# Patient Record
Sex: Female | Born: 1940 | Race: White | Hispanic: No | Smoking: Never smoker
Health system: Southern US, Community
[De-identification: ages and names within clinical notes are randomized; demographics above are authoritative.]

## PROBLEM LIST (undated history)

## (undated) DIAGNOSIS — I10 Essential (primary) hypertension: Secondary | ICD-10-CM

## (undated) DIAGNOSIS — N289 Disorder of kidney and ureter, unspecified: Secondary | ICD-10-CM

---

## 2010-09-09 ENCOUNTER — Encounter: Payer: Self-pay | Admitting: Internal Medicine

## 2010-09-16 ENCOUNTER — Encounter: Payer: Self-pay | Admitting: Internal Medicine

## 2011-03-25 DIAGNOSIS — J479 Bronchiectasis, uncomplicated: Secondary | ICD-10-CM

## 2012-11-17 ENCOUNTER — Emergency Department: Payer: Self-pay | Admitting: Emergency Medicine

## 2012-11-17 IMAGING — CR DG KNEE COMPLETE 4+V*L*
1 series · 4 of 4 positions shown · non-contrast
Comparison: none

REASON FOR EXAM: fall today, pain and swelling
COMMENTS:

PROCEDURE:     DXR - DXR KNEE LT COMP WITH OBLIQUES  - [DATE] [DATE]
RESULT:     Comparison: None.

[Series 1: t knee ap left · 0.14mm/px · 4 of 4 slices shown]
[im 1/4]
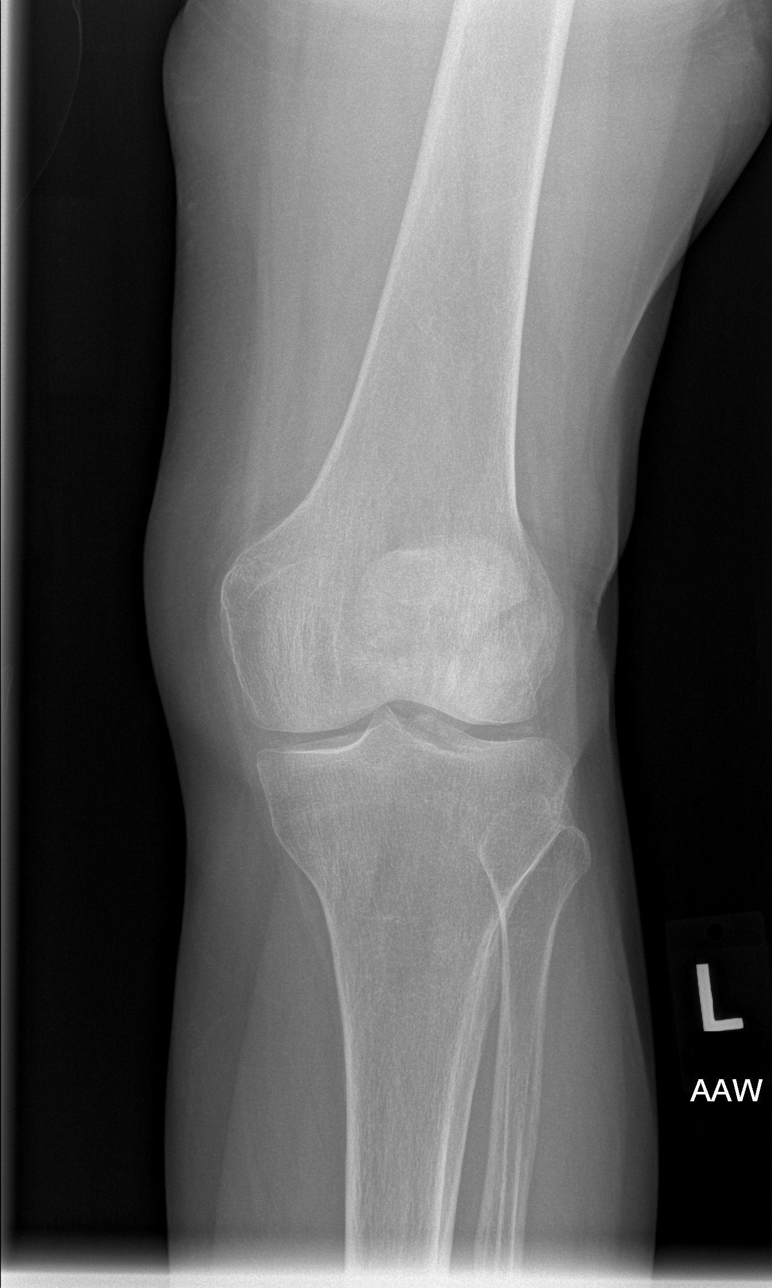
[im 2/4]
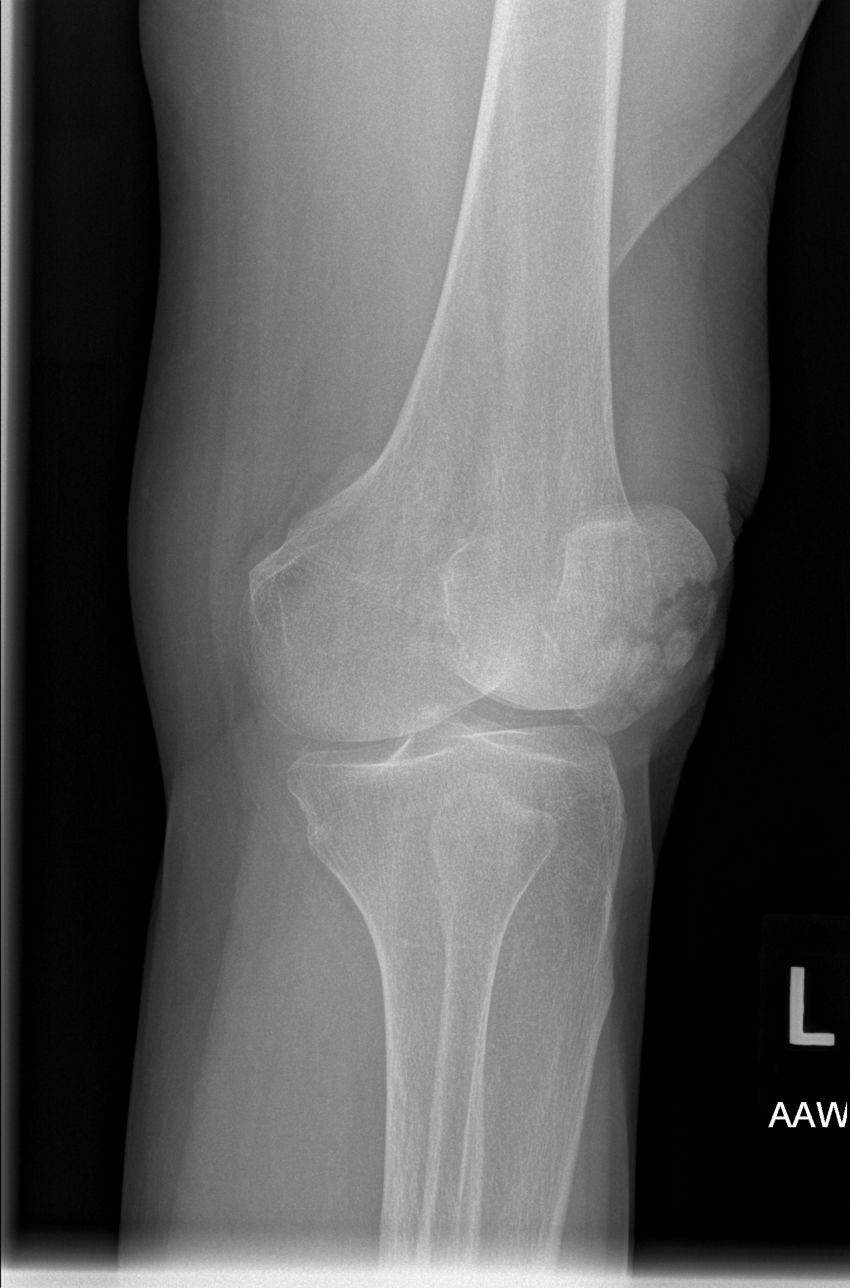
[im 3/4]
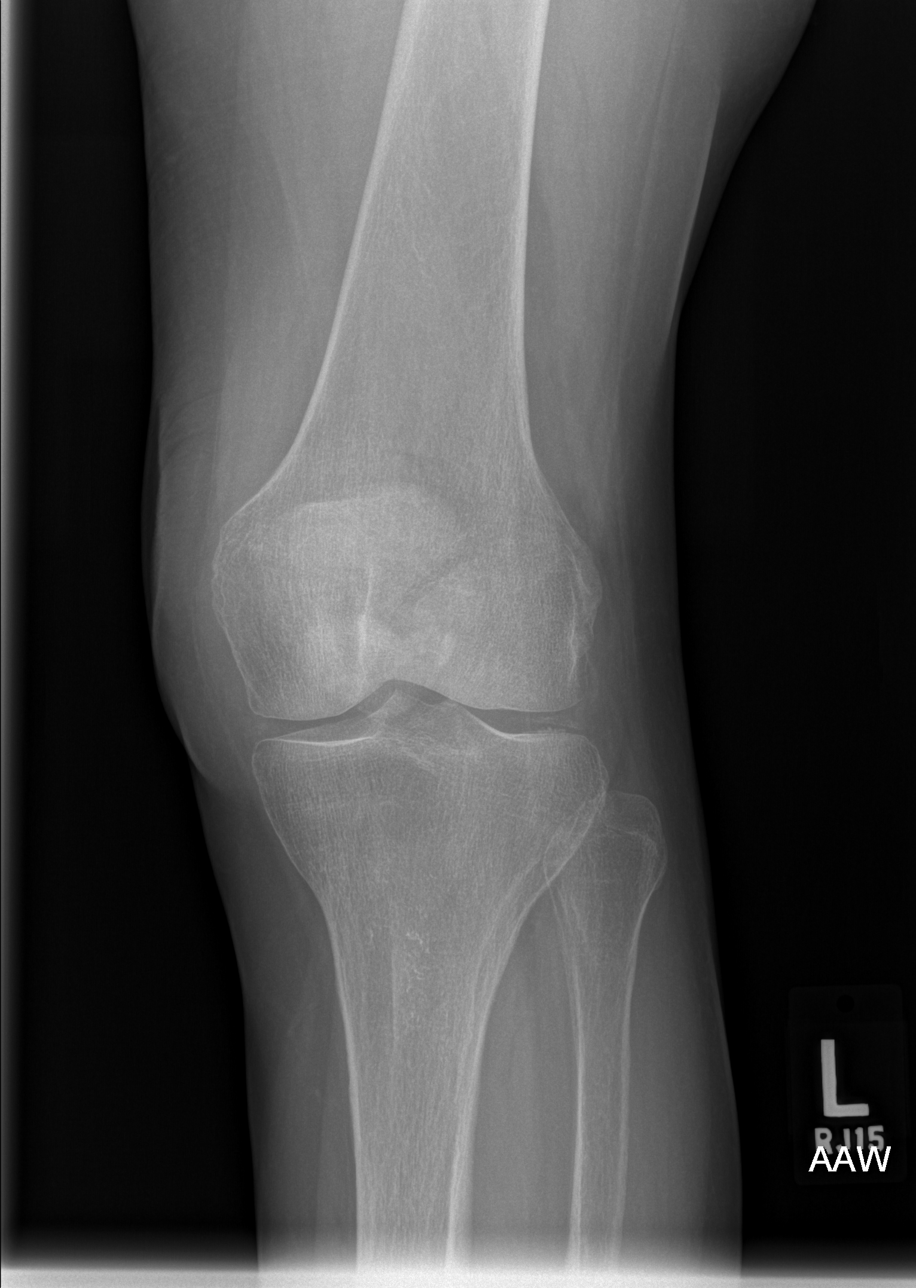
[im 4/4]
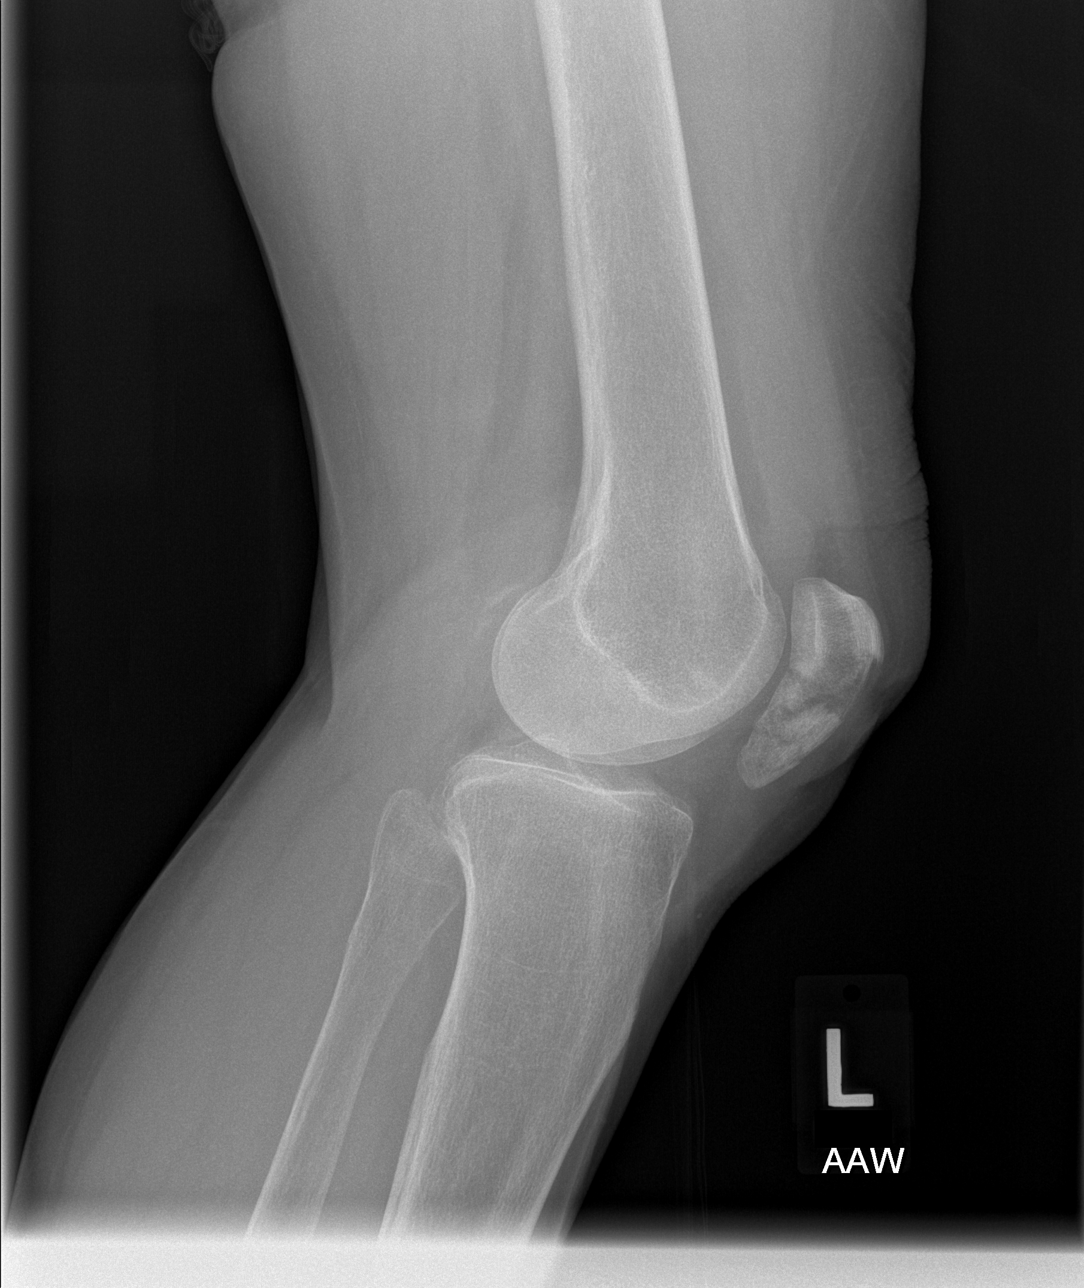

[4 of 4 positions shown; findings below may reference images not displayed]

FINDINGS: There is a comminuted, mildly displaced fracture of the patella.
Chondrocalcinosis is seen in the medial and lateral compartments, most
commonly seen with CPPD arthropathy. There is a small joint effusion.
IMPRESSION: Mildly displaced patellar fracture.

[REDACTED]

## 2015-12-06 DIAGNOSIS — E039 Hypothyroidism, unspecified: Secondary | ICD-10-CM | POA: Diagnosis present

## 2018-11-17 DIAGNOSIS — I10 Essential (primary) hypertension: Secondary | ICD-10-CM | POA: Diagnosis present

## 2021-07-19 ENCOUNTER — Other Ambulatory Visit: Payer: Self-pay

## 2021-07-19 ENCOUNTER — Inpatient Hospital Stay
Admission: EM | Admit: 2021-07-19 | Discharge: 2021-07-21 | DRG: 871 | Disposition: A | Discharge status: Other Acute Inpatient Hospital | Payer: Medicare Other | Attending: Obstetrics and Gynecology | Admitting: Obstetrics and Gynecology

## 2021-07-19 ENCOUNTER — Emergency Department: Payer: Medicare Other

## 2021-07-19 DIAGNOSIS — E872 Acidosis, unspecified: Secondary | ICD-10-CM | POA: Diagnosis present

## 2021-07-19 DIAGNOSIS — W19XXXA Unspecified fall, initial encounter: Secondary | ICD-10-CM | POA: Diagnosis present

## 2021-07-19 DIAGNOSIS — R410 Disorientation, unspecified: Secondary | ICD-10-CM

## 2021-07-19 DIAGNOSIS — I471 Supraventricular tachycardia, unspecified: Secondary | ICD-10-CM

## 2021-07-19 DIAGNOSIS — Z20822 Contact with and (suspected) exposure to covid-19: Secondary | ICD-10-CM | POA: Diagnosis present

## 2021-07-19 DIAGNOSIS — N3000 Acute cystitis without hematuria: Secondary | ICD-10-CM | POA: Diagnosis present

## 2021-07-19 DIAGNOSIS — R6520 Severe sepsis without septic shock: Secondary | ICD-10-CM | POA: Diagnosis present

## 2021-07-19 DIAGNOSIS — R471 Dysarthria and anarthria: Secondary | ICD-10-CM | POA: Diagnosis present

## 2021-07-19 DIAGNOSIS — S066XAA Traumatic subarachnoid hemorrhage with loss of consciousness status unknown, initial encounter: Secondary | ICD-10-CM | POA: Diagnosis present

## 2021-07-19 DIAGNOSIS — N179 Acute kidney failure, unspecified: Secondary | ICD-10-CM | POA: Diagnosis not present

## 2021-07-19 DIAGNOSIS — Z888 Allergy status to other drugs, medicaments and biological substances status: Secondary | ICD-10-CM

## 2021-07-19 DIAGNOSIS — K449 Diaphragmatic hernia without obstruction or gangrene: Secondary | ICD-10-CM | POA: Diagnosis present

## 2021-07-19 DIAGNOSIS — I10 Essential (primary) hypertension: Secondary | ICD-10-CM | POA: Diagnosis present

## 2021-07-19 DIAGNOSIS — Z91041 Radiographic dye allergy status: Secondary | ICD-10-CM

## 2021-07-19 DIAGNOSIS — R338 Other retention of urine: Secondary | ICD-10-CM | POA: Diagnosis not present

## 2021-07-19 DIAGNOSIS — N1330 Unspecified hydronephrosis: Secondary | ICD-10-CM

## 2021-07-19 DIAGNOSIS — E039 Hypothyroidism, unspecified: Secondary | ICD-10-CM | POA: Diagnosis present

## 2021-07-19 DIAGNOSIS — J189 Pneumonia, unspecified organism: Secondary | ICD-10-CM | POA: Diagnosis not present

## 2021-07-19 DIAGNOSIS — G9341 Metabolic encephalopathy: Secondary | ICD-10-CM | POA: Diagnosis present

## 2021-07-19 DIAGNOSIS — J479 Bronchiectasis, uncomplicated: Secondary | ICD-10-CM

## 2021-07-19 DIAGNOSIS — N134 Hydroureter: Secondary | ICD-10-CM | POA: Diagnosis present

## 2021-07-19 DIAGNOSIS — I129 Hypertensive chronic kidney disease with stage 1 through stage 4 chronic kidney disease, or unspecified chronic kidney disease: Secondary | ICD-10-CM | POA: Diagnosis present

## 2021-07-19 DIAGNOSIS — E860 Dehydration: Secondary | ICD-10-CM | POA: Diagnosis present

## 2021-07-19 DIAGNOSIS — J471 Bronchiectasis with (acute) exacerbation: Secondary | ICD-10-CM | POA: Diagnosis not present

## 2021-07-19 DIAGNOSIS — R636 Underweight: Secondary | ICD-10-CM | POA: Diagnosis present

## 2021-07-19 DIAGNOSIS — Z681 Body mass index (BMI) 19 or less, adult: Secondary | ICD-10-CM | POA: Diagnosis not present

## 2021-07-19 DIAGNOSIS — N170 Acute kidney failure with tubular necrosis: Secondary | ICD-10-CM | POA: Diagnosis not present

## 2021-07-19 DIAGNOSIS — I6389 Other cerebral infarction: Secondary | ICD-10-CM | POA: Diagnosis not present

## 2021-07-19 DIAGNOSIS — D631 Anemia in chronic kidney disease: Secondary | ICD-10-CM

## 2021-07-19 DIAGNOSIS — E785 Hyperlipidemia, unspecified: Secondary | ICD-10-CM | POA: Diagnosis present

## 2021-07-19 DIAGNOSIS — E871 Hypo-osmolality and hyponatremia: Secondary | ICD-10-CM

## 2021-07-19 DIAGNOSIS — R778 Other specified abnormalities of plasma proteins: Secondary | ICD-10-CM | POA: Diagnosis present

## 2021-07-19 DIAGNOSIS — R414 Neurologic neglect syndrome: Secondary | ICD-10-CM | POA: Diagnosis present

## 2021-07-19 DIAGNOSIS — A419 Sepsis, unspecified organism: Secondary | ICD-10-CM | POA: Diagnosis present

## 2021-07-19 DIAGNOSIS — I619 Nontraumatic intracerebral hemorrhage, unspecified: Secondary | ICD-10-CM | POA: Diagnosis not present

## 2021-07-19 DIAGNOSIS — M810 Age-related osteoporosis without current pathological fracture: Secondary | ICD-10-CM | POA: Diagnosis present

## 2021-07-19 DIAGNOSIS — I611 Nontraumatic intracerebral hemorrhage in hemisphere, cortical: Secondary | ICD-10-CM | POA: Diagnosis not present

## 2021-07-19 DIAGNOSIS — N184 Chronic kidney disease, stage 4 (severe): Secondary | ICD-10-CM | POA: Diagnosis present

## 2021-07-19 DIAGNOSIS — G936 Cerebral edema: Secondary | ICD-10-CM | POA: Diagnosis not present

## 2021-07-19 DIAGNOSIS — G9340 Encephalopathy, unspecified: Secondary | ICD-10-CM | POA: Diagnosis not present

## 2021-07-19 LAB — COMPREHENSIVE METABOLIC PANEL
ALT: 15 U/L (ref 0–44)
AST: 35 U/L (ref 15–41)
Albumin: 3.1 g/dL — ABNORMAL LOW (ref 3.5–5.0)
Alkaline Phosphatase: 68 U/L (ref 38–126)
Anion gap: 12 (ref 5–15)
BUN: 41 mg/dL — ABNORMAL HIGH (ref 8–23)
CO2: 17 mmol/L — ABNORMAL LOW (ref 22–32)
Calcium: 8.9 mg/dL (ref 8.9–10.3)
Chloride: 97 mmol/L — ABNORMAL LOW (ref 98–111)
Creatinine, Ser: 2.13 mg/dL — ABNORMAL HIGH (ref 0.44–1.00)
GFR, Estimated: 23 mL/min — ABNORMAL LOW (ref >60)
Glucose, Bld: 157 mg/dL — ABNORMAL HIGH (ref 70–99)
Potassium: 4.7 mmol/L (ref 3.5–5.1)
Sodium: 126 mmol/L — ABNORMAL LOW (ref 135–145)
Total Bilirubin: 0.7 mg/dL (ref 0.3–1.2)
Total Protein: 7.9 g/dL (ref 6.5–8.1)

## 2021-07-19 LAB — CBC
HCT: 31.8 % — ABNORMAL LOW (ref 36.0–46.0)
Hemoglobin: 10.2 g/dL — ABNORMAL LOW (ref 12.0–15.0)
MCH: 30.6 pg (ref 26.0–34.0)
MCHC: 32.1 g/dL (ref 30.0–36.0)
MCV: 95.5 fL (ref 80.0–100.0)
Platelets: 343 10*3/uL (ref 150–400)
RBC: 3.33 MIL/uL — ABNORMAL LOW (ref 3.87–5.11)
RDW: 13.4 % (ref 11.5–15.5)
WBC: 17.1 10*3/uL — ABNORMAL HIGH (ref 4.0–10.5)
nRBC: 0 % (ref 0.0–0.2)

## 2021-07-19 LAB — URINALYSIS, ROUTINE W REFLEX MICROSCOPIC
Bilirubin Urine: NEGATIVE
Glucose, UA: NEGATIVE mg/dL
Ketones, ur: NEGATIVE mg/dL
Leukocytes,Ua: NEGATIVE
Nitrite: NEGATIVE
Protein, ur: >=300 mg/dL — AB
Specific Gravity, Urine: 1.012 (ref 1.005–1.030)
pH: 6 (ref 5.0–8.0)

## 2021-07-19 LAB — RESP PANEL BY RT-PCR (FLU A&B, COVID) ARPGX2
Influenza A by PCR: NEGATIVE
Influenza B by PCR: NEGATIVE
SARS Coronavirus 2 by RT PCR: NEGATIVE

## 2021-07-19 LAB — LACTIC ACID, PLASMA: Lactic Acid, Venous: 3.5 mmol/L (ref 0.5–1.9)

## 2021-07-19 LAB — TROPONIN I (HIGH SENSITIVITY): Troponin I (High Sensitivity): 33 ng/L — ABNORMAL HIGH (ref <18)

## 2021-07-19 IMAGING — DX DG CHEST 1V PORT
1 series · 1 of 1 positions shown · non-contrast
Comparison: None.

CLINICAL DATA: Suspected sepsis.  Altered mental status.

EXAM:
PORTABLE CHEST 1 VIEW

[chest pa]
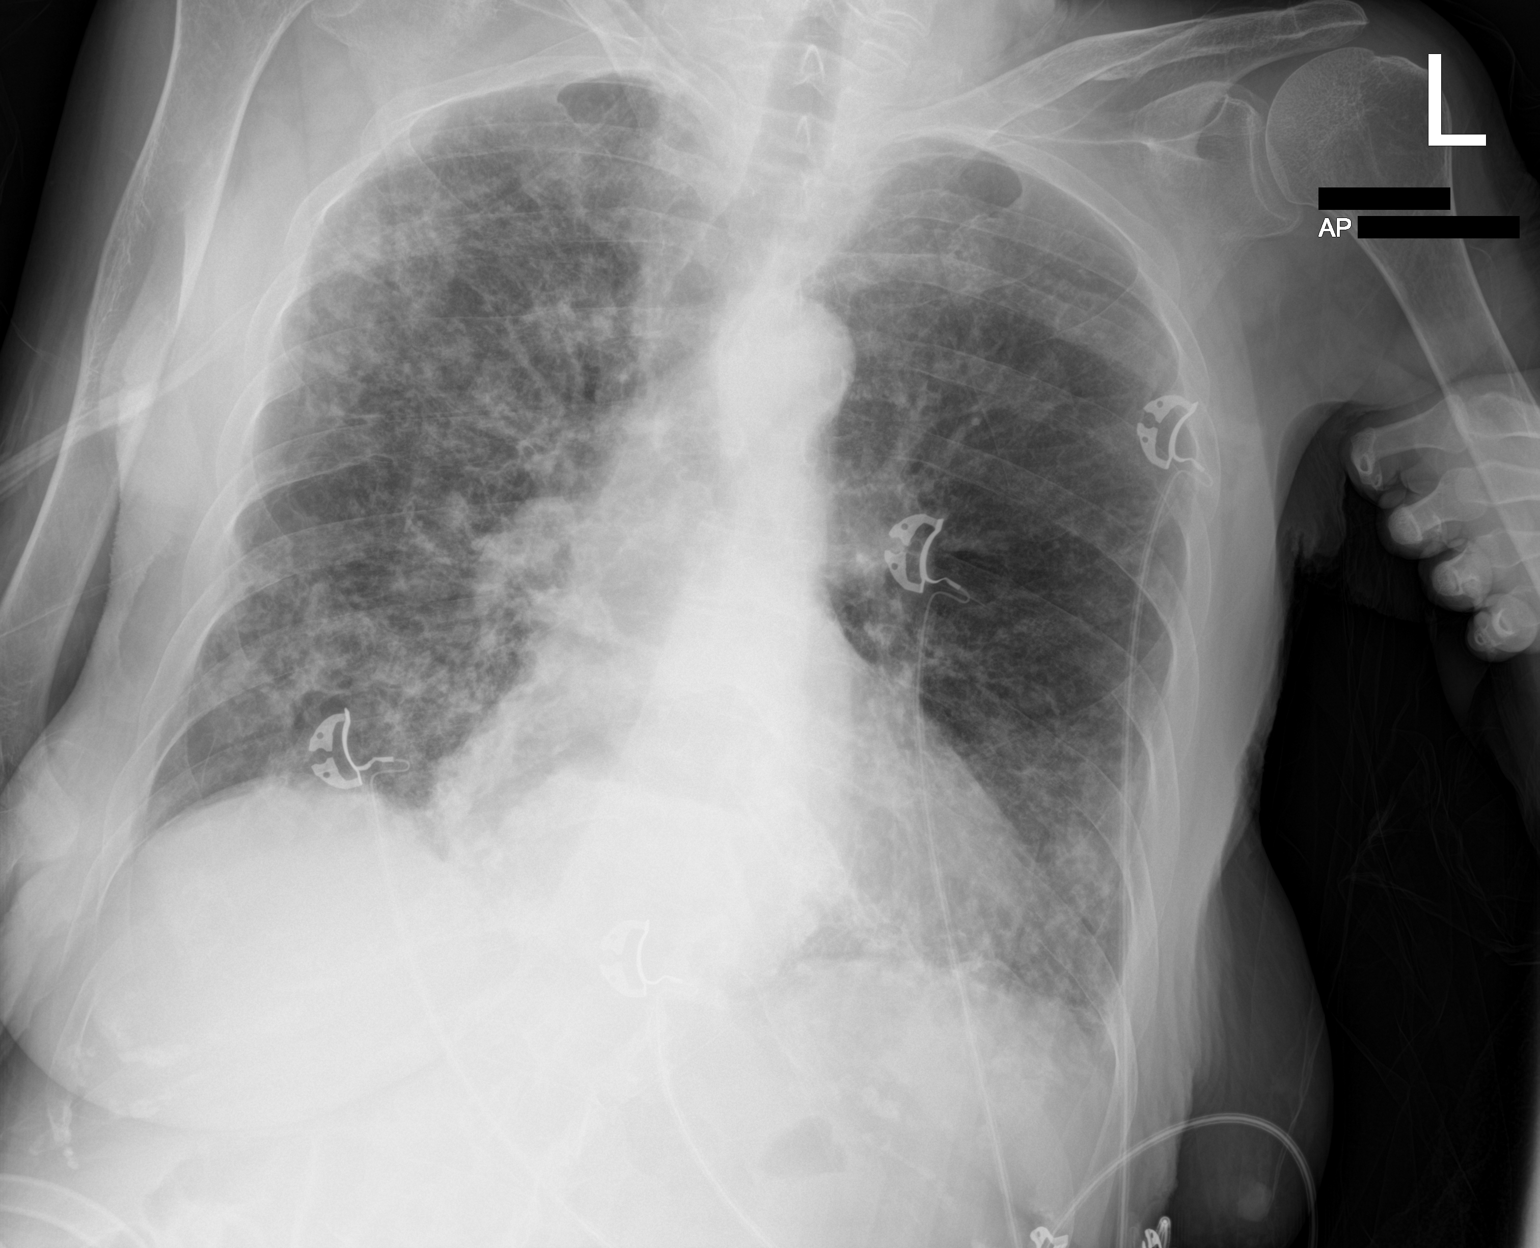

[1 of 1 positions shown; findings below may reference images not displayed]

FINDINGS: The heart is enlarged. There is a retrocardiac hiatal hernia. Patchy
airspace opacities throughout the right lung and at the left lung
base. There is background interstitial thickening. No significant
pleural effusion. No pneumothorax. The bones are under mineralized.
No acute osseous findings.
IMPRESSION: 1. Patchy airspace opacities throughout the right lung and at the
left lung base. Differential considerations include multifocal
pneumonia, including atypical organisms, or asymmetric pulmonary
edema.
2. Cardiomegaly and hiatal hernia.

## 2021-07-19 MED ORDER — ONDANSETRON HCL 4 MG/2ML IJ SOLN
4.0000 mg | Freq: Four times a day (QID) | INTRAMUSCULAR | Status: DC | PRN
Start: 2021-07-19 — End: 2021-07-21
  Administered 2021-07-21: 4 mg via INTRAVENOUS
  Filled 2021-07-19: qty 2

## 2021-07-19 MED ORDER — SODIUM CHLORIDE 0.9 % IV SOLN
500.0000 mg | INTRAVENOUS | Status: DC
  Administered 2021-07-20: 500 mg via INTRAVENOUS
  Filled 2021-07-19: qty 5

## 2021-07-19 MED ORDER — ENOXAPARIN SODIUM 30 MG/0.3ML IJ SOSY
30.0000 mg | PREFILLED_SYRINGE | INTRAMUSCULAR | Status: DC
  Administered 2021-07-19 – 2021-07-20 (×2): 30 mg via SUBCUTANEOUS
  Filled 2021-07-19 (×2): qty 0.3

## 2021-07-19 MED ORDER — SODIUM CHLORIDE 0.9 % IV SOLN: 2.0000 g | INTRAVENOUS | Status: DC

## 2021-07-19 MED ORDER — SODIUM CHLORIDE 0.9 % IV SOLN
2.0000 g | Freq: Once | INTRAVENOUS | Status: AC
  Administered 2021-07-19: 2 g via INTRAVENOUS
  Filled 2021-07-19: qty 2

## 2021-07-19 MED ORDER — VANCOMYCIN HCL 500 MG/100ML IV SOLN: 500.0000 mg | INTRAVENOUS | Status: DC

## 2021-07-19 MED ORDER — SODIUM CHLORIDE 0.9 % IV BOLUS (SEPSIS)
1000.0000 mL | Freq: Once | INTRAVENOUS | Status: AC
  Administered 2021-07-19: 1000 mL via INTRAVENOUS

## 2021-07-19 MED ORDER — ACETAMINOPHEN 325 MG PO TABS
650.0000 mg | ORAL_TABLET | Freq: Four times a day (QID) | ORAL | Status: DC | PRN
Start: 2021-07-19 — End: 2021-07-21
  Administered 2021-07-20 – 2021-07-21 (×3): 650 mg via ORAL
  Filled 2021-07-19 (×3): qty 2

## 2021-07-19 MED ORDER — LACTATED RINGERS IV SOLN: INTRAVENOUS | Status: AC

## 2021-07-19 MED ORDER — VANCOMYCIN HCL IN DEXTROSE 1-5 GM/200ML-% IV SOLN
1000.0000 mg | Freq: Once | INTRAVENOUS | Status: AC
Start: 2021-07-19 — End: 2021-07-20
  Administered 2021-07-19: 1000 mg via INTRAVENOUS
  Filled 2021-07-19: qty 200

## 2021-07-19 MED ORDER — METRONIDAZOLE 500 MG/100ML IV SOLN
500.0000 mg | Freq: Once | INTRAVENOUS | Status: AC
  Administered 2021-07-19: 500 mg via INTRAVENOUS
  Filled 2021-07-19: qty 100

## 2021-07-19 MED ORDER — ONDANSETRON HCL 4 MG PO TABS: 4.0000 mg | ORAL_TABLET | Freq: Four times a day (QID) | ORAL | Status: DC | PRN

## 2021-07-19 MED ORDER — ACETAMINOPHEN 650 MG RE SUPP
650.0000 mg | Freq: Four times a day (QID) | RECTAL | Status: DC | PRN
  Administered 2021-07-20: 650 mg via RECTAL
  Filled 2021-07-19 (×2): qty 2

## 2021-07-19 MED ORDER — HYDRALAZINE HCL 20 MG/ML IJ SOLN
5.0000 mg | INTRAMUSCULAR | Status: DC | PRN
  Administered 2021-07-19 – 2021-07-20 (×2): 5 mg via INTRAVENOUS
  Filled 2021-07-19 (×2): qty 1

## 2021-07-19 NOTE — Assessment & Plan Note (Addendum)
Renal function for the most part at baseline ?Follows with nephrology ?

## 2021-07-19 NOTE — H&P (Addendum)
?History and Physical  ? ? ?Patient: Lauren Conner LNL:[id number] DOB: 09-May-1941 ?DOA: 07/19/2021 ?DOS: the patient was seen and examined on 07/19/2021 ?PCP: No primary care provider on file.  ?Patient coming from: Home ? ?Chief Complaint:  ?Chief Complaint  ?Patient presents with  ? Altered Mental Status  ? ? ?HPI: Lauren Conner is a 81 y.o. female with medical history significant for CKD4, anemia of CKD, paroxysmal SVT, hypothyroidism, bronchiectasis, HTN, who lives alone and is functionally independent who was brought in for evaluation of weakness and confusion that was noted on the day of arrival.  History is limited due to altered mental status patient appeared to complain of right-sided flank pain ?ED course: On arrival febrile to 100.5 tachycardic to 124 and tachypneic to 23-24.  BP 154/121, O2 sat of 100% on room air ?Blood work significant for leukocytosis of 17,000 with lactic acid 3.5.  Creatinine 2.13, close to baseline of 2, with bicarb of 17.  Sodium 126.  Troponin 33.  Urinalysis with proteinuria but not consistent with UTI.  COVID and flu negative ?EKG, personally reviewed and interpreted: Sinus tachycardia at 127 with no acute ST-T wave changes ?Chest x-ray showing patchy airspace opacities throughout the right lung and at the left lung base with differential considerations including multifocal pneumonia, including atypical organisms or asymmetric pulmonary edema. ?Patient started on fluids per sepsis protocol, IV cefepime Vanco and Flagyl.  Hospitalist consulted for admission.  ? ?Following admission, CT abdomen and pelvis ordered for right flank pain with finding of severe bilateral hydronephrosis and proximal hydroureter ureter with severely distended bladder.  Foley catheter ordered for acute urinary retention ? ?Review of Systems: unable to review all systems due to the inability of the patient to answer questions. ?History reviewed. No pertinent past medical history. ?History reviewed. No pertinent  surgical history. ?Social History:  has no history on file for tobacco use, alcohol use, and drug use. ? ?Not on File ? ?History reviewed. No pertinent family history. ? ?Prior to Admission medications   ?Not on File  ? ? ?Physical Exam: ?Vitals:  ? 07/19/21 2115 07/19/21 2124 07/19/21 2200 07/19/21 2215  ?BP: (!) 154/121  (!) 172/133   ?Pulse: (!) 124  (!) 114 (!) 109  ?Resp: (!) 54  (!) 23 (!) 24  ?Temp:  (!) 100.5 ?F (38.1 ?C)    ?TempSrc:  Rectal    ?SpO2: 100%  100% 100%  ? ?Physical Exam ?Vitals and nursing note reviewed.  ?Constitutional:   ?   General: She is not in acute distress. ?   Appearance: She is ill-appearing and toxic-appearing.  ?HENT:  ?   Head: Normocephalic and atraumatic.  ?Cardiovascular:  ?   Rate and Rhythm: Regular rhythm. Tachycardia present.  ?   Pulses: Normal pulses.  ?   Heart sounds: Normal heart sounds. No murmur heard. ?Pulmonary:  ?   Effort: Tachypnea present.  ?   Breath sounds: Examination of the right-lower field reveals rales. Examination of the left-lower field reveals rales. Rales present. No wheezing or rhonchi.  ?Abdominal:  ?   General: Bowel sounds are normal.  ?   Palpations: Abdomen is soft.  ?   Tenderness: There is abdominal tenderness in the right lower quadrant.  ?Musculoskeletal:     ?   General: No swelling or tenderness. Normal range of motion.  ?   Cervical back: Normal range of motion and neck supple.  ?Skin: ?   General: Skin is warm and dry.  ?  Neurological:  ?   General: No focal deficit present.  ?   Mental Status: She is lethargic and disoriented.  ?Psychiatric:     ?   Mood and Affect: Mood normal.     ?   Behavior: Behavior normal.  ? ? ? ?Data Reviewed: ?Relevant notes from primary care and specialist visits, past discharge summaries as available in EHR, including Care Everywhere. ?Prior diagnostic testing as pertinent to current admission diagnoses ?Updated medications and problem lists for reconciliation ?ED course, including vitals, labs, imaging,  treatment and response to treatment ?Triage notes, nursing and pharmacy notes and ED provider's notes ?Notable results as noted in HPI ? ? 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 ?Severe sepsis ([place]) ?Sepsis criteria includes fever, tachycardia, tachypnea, altered mental status, leukocytosis and lactic acidosis ?Continue sepsis fluids per sepsis protocol ?Treat pneumonia as outlined below ? ? ?Hyponatremia ?Mild Acute on chronic: baseline 128-131 per review in care everywhere ?Continue IV hydration and monitor ? ?Metabolic acidosis ?Suspect metabolic due to kidney function, possibly mild worsening of stage IV kidney disease ?Monitor response to IV hydration and consider bicarb infusion little or no improvement ? ? ?Acute metabolic encephalopathy ?At baseline patient functionally independent.  Lives alone ?We will keep n.p.o. overnight to prevent aspiration ?Speech therapy evaluation ?Neurochecks, fall and aspiration precautions ?Delirium precautions ? ?Anemia of chronic kidney failure, stage 4 (severe) (HCC) ?Hemoglobin at baseline ? ?Paroxysmal SVT (supraventricular tachycardia) (HCC) ?Metoprolol IV as needed.  On oral metoprolol at home ? ?CKD (chronic kidney disease) stage 4, GFR 15-29 ml/min (HCC) ?Renal function for the most part at baseline ?Follows with nephrology ? ?Hypothyroidism (acquired) ?Levothyroxine when safe to swallow ? ?Adult bronchiectasis ([place]) ?DuoNebs as needed ? ?Essential hypertension ?Blood pressure elevated, uncontrolled ?Hydralazine IV as needed while n.p.o. ? ? ? ? ? ? ?Advance Care Planning:   Code Status: Not on file full ? ?Consults: none ? ?Family Communication: son and grand daughter at bedside ? ?Severity of Illness: ?The appropriate patient status for  this patient is INPATIENT. Inpatient status is judged to be reasonable and necessary in order to provide the required intensity of service to ensure the patient's safety. The patient's presenting symptoms, physical exam findings, and initial radiographic and laboratory data in the context of their chronic comorbidities is felt to place them at high risk for further clinical deterioration. Furthermore, it is not anticipated that the patient will be medically stable for discharge from the hospital within 2 midnights of admission.  ? ?* I certify that at the point of admission it is my clinical judgment that the patient will require inpatient hospital care spanning beyond 2 midnights from the point of admission due to high intensity of service, high risk for further deterioration and high frequency of surveillance required.* ? ?Author: ?[name], MD ?07/19/2021 11:12 PM ? ?For on call review [email or web address].  ?

## 2021-07-19 NOTE — Progress Notes (Signed)
CODE SEPSIS - PHARMACY COMMUNICATION ? ?**Broad Spectrum Antibiotics should be administered within 1 hour of Sepsis diagnosis** ? ?Time Code Sepsis Called/Page Received: 3/4 @ 2148  ? ?Antibiotics Ordered: Cefepime, Vancomycin  ? ?Time of 1st antibiotic administration: Cefepime 2 gm IV X 1 on 3/4 @ 2214 ? ?Additional action taken by pharmacy:  ? ?If necessary, Name of Provider/Nurse Contacted:  ? ? ? ?Lauren Conner D ,PharmD ?Clinical Pharmacist  ?07/19/2021  10:22 PM ? ?

## 2021-07-19 NOTE — Assessment & Plan Note (Signed)
See above. 

## 2021-07-19 NOTE — Assessment & Plan Note (Signed)
Sepsis criteria includes fever, tachycardia, tachypnea, altered mental status, leukocytosis and lactic acidosis ?Continue sepsis fluids per sepsis protocol ?Treat pneumonia as outlined below ? ?

## 2021-07-19 NOTE — Assessment & Plan Note (Signed)
DuoNebs as needed 

## 2021-07-19 NOTE — Assessment & Plan Note (Signed)
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

## 2021-07-19 NOTE — Assessment & Plan Note (Signed)
Suspect metabolic due to kidney function, possibly mild worsening of stage IV kidney disease ?Monitor response to IV hydration and consider bicarb infusion little or no improvement ? ?

## 2021-07-19 NOTE — ED Notes (Signed)
Covid swab sent to lab at this time.  ?

## 2021-07-19 NOTE — ED Provider Notes (Signed)
? ?[facility] ?Provider Note ? ? ? Event Date/Time  ? First MD Initiated Contact with Patient 07/19/21 2147   ?  (approximate) ? ?History  ? ?Chief Complaint: Altered Mental Status ? ?HPI ? ?Lauren Conner is a 81 y.o. female with a past medical history of osteoporosis, hypothyroidism, hyperlipidemia, hypertension, presents to the emergency department for confusion weakness.  According to EMS patient typically lives alone and is able to care for herself independently.  Today patient was noted to be significantly confused.  In the emergency department patient is not following most commands that she is confused and somewhat difficult to redirect.  Patient noted to be febrile to 100.5 tachycardic tachypneic. ? ?Physical Exam  ? ?Triage Vital Signs: ?ED Triage Vitals  ?Enc Vitals Group  ?   BP 07/19/21 2115 (!) 154/121  ?   Pulse Rate 07/19/21 2115 (!) 124  ?   Resp 07/19/21 2115 (!) 54  ?   Temp 07/19/21 2124 (!) 100.5 ?F (38.1 ?C)  ?   Temp Source 07/19/21 2124 Rectal  ?   SpO2 07/19/21 2115 100 %  ?   Weight --   ?   Height --   ?   Head Circumference --   ?   Peak Flow --   ?   Pain Score --   ?   Pain Loc --   ?   Pain Edu? --   ?   Excl. in [place]? --   ? ? ?Most recent vital signs: ?Vitals:  ? 07/19/21 2200 07/19/21 2215  ?BP: (!) 172/133   ?Pulse: (!) 114 (!) 109  ?Resp: (!) 23 (!) 24  ?Temp:    ?SpO2: 100% 100%  ? ? ?General: Patient is awake and alert but she is confused having difficulty following commands, restless appearing. ?CV:  Good peripheral perfusion.  Regular rhythm rate around 120 bpm. ?Resp:  Patient is moderately tachypneic around 30 breaths/min.  No obvious rhonchi wheeze or rales. ?Abd:  No distention.  Soft, nontender.  No rebound or guarding.  Benign abdominal exam. ? ? ? ?ED Results / Procedures / Treatments  ? ?EKG ? ?EKG viewed and interpreted by myself shows sinus tachycardia 127 bpm with a narrow QRS, normal axis, largely normal intervals with nonspecific ST changes but no  ST elevation. ? ?RADIOLOGY ? ?Chest x-ray shows patchy airspace opacities. ? ? ?MEDICATIONS ORDERED IN ED: ?Medications  ?lactated ringers infusion (has no administration in time range)  ?metroNIDAZOLE (FLAGYL) IVPB 500 mg (500 mg Intravenous New Bag/Given 07/19/21 2223)  ?vancomycin (VANCOCIN) IVPB 1000 mg/200 mL premix (has no administration in time range)  ?sodium chloride 0.9 % bolus 1,000 mL (1,000 mLs Intravenous New Bag/Given 07/19/21 2211)  ?ceFEPIme (MAXIPIME) 2 g in sodium chloride 0.9 % 100 mL IVPB (2 g Intravenous New Bag/Given 07/19/21 2214)  ? ? ? ?IMPRESSION / MDM / ASSESSMENT AND PLAN / ED COURSE  ?I reviewed the triage vital signs and the nursing notes. ? ?Patient presents emergency department for confusion, weakness, usually lives alone and is quite independent.  Patient found to be febrile tachycardic tachypneic meeting sepsis criteria.  We will check labs, cultures, urine we will start the patient on broad-spectrum antibiotics, IV hydrate while awaiting further results. ? ?Patient's labs have begun to result showing chemistry with a sodium of 126 and a bicarb of 17.  Patient's troponin is mildly elevated at 33 and lactic acid is elevated at 3.5 patient has a white blood cell  count of 17,000 again consistent with sepsis.  We will continue with broad-spectrum antibiotics as well as IV fluids. ? ?COVID/flu are negative.  Chest x-ray shows multifocal pneumonia. ?Reviewed the patient's PCP note from 04/29/2021 for past medical records and history.  No significant/pertinent findings. ? ?Patient appears to be septic likely from multifocal pneumonia.  Patient receiving broad-spectrum antibiotics.  Patient also is moderately hyponatremic.  We will continue with IV hydration with normal saline.  Labs show chronic renal insufficiency unchanged from prior lab work in reviewing the patient's care everywhere chart. ? ?CRITICAL CARE ?Performed by: Harvest Dark ? ? ?Total critical care time: 30  minutes ? ?Critical care time was exclusive of separately billable procedures and treating other patients. ? ?Critical care was necessary to treat or prevent imminent or life-threatening deterioration. ? ?Critical care was time spent personally by me on the following activities: development of treatment plan with patient and/or surrogate as well as nursing, discussions with consultants, evaluation of patient's response to treatment, examination of patient, obtaining history from patient or surrogate, ordering and performing treatments and interventions, ordering and review of laboratory studies, ordering and review of radiographic studies, pulse oximetry and re-evaluation of patient's condition. ? ? ?FINAL CLINICAL IMPRESSION(S) / ED DIAGNOSES  ? ?Sepsis ?Confusion ?Multifocal pneumonia ? ? ? ?Note:  This document was prepared using Dragon voice recognition software and may include unintentional dictation errors. ?  Harvest Dark, MD ?07/19/21 2255 ? ?

## 2021-07-19 NOTE — Assessment & Plan Note (Signed)
Metoprolol IV as needed.  On oral metoprolol at home ?

## 2021-07-19 NOTE — Assessment & Plan Note (Signed)
Blood pressure elevated, uncontrolled ?Hydralazine IV as needed while n.p.o. ?

## 2021-07-19 NOTE — Progress Notes (Signed)
Pt being followed by [facility] for Sepsis protocol. 

## 2021-07-19 NOTE — Progress Notes (Signed)
Pharmacy Antibiotic Note ? ?Lauren Conner is a 81 y.o. female admitted on 07/19/2021 with pneumonia.  Pharmacy has been consulted for Vancomycin, Cefepime dosing. ? ?Plan: ?Cefepime 2 gm IV X 1 given in ED on 3/4 @ 2214. ?Cefepime 2 gm IV Q24H ordered to continue on 3/5 @ 2200. ? ?Vancomycin 1 gm IV X 1 given in ED on 3/4 @ 2323. ?Vancomycin 500 mg IV Q36H ordered to continue on 3/6 @ 1100. ? ?AUC = 535 ?Vanc trough = 15. 6 mcg/mL  ? ?Height: 5\' 3"  (160 cm) ?Weight: 45.3 kg (99 lb 13.9 oz) ?IBW/kg (Calculated) : 52.4 ? ?Temp (24hrs), Avg:100.5 ?F (38.1 ?C), Min:100.5 ?F (38.1 ?C), Max:100.5 ?F (38.1 ?C) ? ?Recent Labs  ?Lab 07/19/21 ?2120 07/19/21 ?2125 07/19/21 ?2200  ?WBC 17.1*  --   --   ?CREATININE  --   --  2.13*  ?LATICACIDVEN  --  3.5*  --   ?  ?Estimated Creatinine Clearance: 15.3 mL/min (A) (by C-G formula based on SCr of 2.13 mg/dL (H)).   ? ?Not on File ? ?Antimicrobials this admission: ?  >>  ?  >>  ? ?Dose adjustments this admission: ? ? ?Microbiology results: ? BCx:  ? UCx:   ? Sputum:   ? MRSA PCR:  ? ?Thank you for allowing pharmacy to be a part of this patient?s care. ? ?Miro Balderson D ?07/19/2021 11:50 PM ? ?

## 2021-07-19 NOTE — Assessment & Plan Note (Signed)
Levothyroxine when safe to swallow ?

## 2021-07-19 NOTE — Consult Note (Signed)
PHARMACY -  BRIEF ANTIBIOTIC NOTE  ? ?Pharmacy has received consult(s) for Cefepime and Vancomycin from an ED provider.  The patient's profile has been reviewed for ht/wt/allergies/indication/available labs.   ? ?One time order(s) placed for Cefepime 2g x1, Vancomycin 1g x1 ? ?Further antibiotics/pharmacy consults should be ordered by admitting physician if indicated.       ?                ?Thank you, ?Darrick Penna ?07/19/2021  10:29 PM ? ?

## 2021-07-19 NOTE — Assessment & Plan Note (Addendum)
At baseline patient functionally independent.  Lives alone ?We will keep n.p.o. overnight to prevent aspiration ?Speech therapy evaluation ?Neurochecks, fall and aspiration precautions ?Delirium precautions ?

## 2021-07-19 NOTE — Assessment & Plan Note (Addendum)
Mild Acute on chronic: baseline 128-131 per review in care everywhere ?Continue IV hydration and monitor ?

## 2021-07-20 ENCOUNTER — Other Ambulatory Visit: Payer: Self-pay

## 2021-07-20 ENCOUNTER — Inpatient Hospital Stay: Payer: Medicare Other

## 2021-07-20 DIAGNOSIS — J189 Pneumonia, unspecified organism: Secondary | ICD-10-CM | POA: Diagnosis not present

## 2021-07-20 DIAGNOSIS — N1330 Unspecified hydronephrosis: Secondary | ICD-10-CM

## 2021-07-20 DIAGNOSIS — R338 Other retention of urine: Secondary | ICD-10-CM

## 2021-07-20 DIAGNOSIS — M810 Age-related osteoporosis without current pathological fracture: Secondary | ICD-10-CM | POA: Insufficient documentation

## 2021-07-20 LAB — BASIC METABOLIC PANEL
Anion gap: 11 (ref 5–15)
BUN: 39 mg/dL — ABNORMAL HIGH (ref 8–23)
CO2: 16 mmol/L — ABNORMAL LOW (ref 22–32)
Calcium: 8.3 mg/dL — ABNORMAL LOW (ref 8.9–10.3)
Chloride: 102 mmol/L (ref 98–111)
Creatinine, Ser: 2.07 mg/dL — ABNORMAL HIGH (ref 0.44–1.00)
GFR, Estimated: 24 mL/min — ABNORMAL LOW (ref >60)
Glucose, Bld: 116 mg/dL — ABNORMAL HIGH (ref 70–99)
Potassium: 4.5 mmol/L (ref 3.5–5.1)
Sodium: 129 mmol/L — ABNORMAL LOW (ref 135–145)

## 2021-07-20 LAB — CBC
HCT: 27.5 % — ABNORMAL LOW (ref 36.0–46.0)
Hemoglobin: 9.1 g/dL — ABNORMAL LOW (ref 12.0–15.0)
MCH: 31 pg (ref 26.0–34.0)
MCHC: 33.1 g/dL (ref 30.0–36.0)
MCV: 93.5 fL (ref 80.0–100.0)
Platelets: 273 10*3/uL (ref 150–400)
RBC: 2.94 MIL/uL — ABNORMAL LOW (ref 3.87–5.11)
RDW: 13.3 % (ref 11.5–15.5)
WBC: 13.6 10*3/uL — ABNORMAL HIGH (ref 4.0–10.5)
nRBC: 0 % (ref 0.0–0.2)

## 2021-07-20 LAB — STREP PNEUMONIAE URINARY ANTIGEN: Strep Pneumo Urinary Antigen: NEGATIVE

## 2021-07-20 LAB — TROPONIN I (HIGH SENSITIVITY)
Troponin I (High Sensitivity): 55 ng/L — ABNORMAL HIGH (ref <18)
Troponin I (High Sensitivity): 81 ng/L — ABNORMAL HIGH (ref <18)
Troponin I (High Sensitivity): 81 ng/L — ABNORMAL HIGH

## 2021-07-20 LAB — MRSA NEXT GEN BY PCR, NASAL: MRSA by PCR Next Gen: NOT DETECTED

## 2021-07-20 LAB — PROTIME-INR
INR: 1.2 (ref 0.8–1.2)
Prothrombin Time: 15.1 seconds (ref 11.4–15.2)

## 2021-07-20 LAB — LACTIC ACID, PLASMA
Lactic Acid, Venous: 1.6 mmol/L (ref 0.5–1.9)
Lactic Acid, Venous: 2.3 mmol/L (ref 0.5–1.9)

## 2021-07-20 LAB — PROCALCITONIN: Procalcitonin: 0.32 ng/mL

## 2021-07-20 LAB — CORTISOL-AM, BLOOD: Cortisol - AM: 26.4 ug/dL — ABNORMAL HIGH (ref 6.7–22.6)

## 2021-07-20 IMAGING — CT CT CHEST W/O CM
2 of 4 series · 15 of 36 positions shown, 18 images · non-contrast
Comparison: No priors.

CLINICAL DATA: 80-year-old female with history of
community-acquired pneumonia.



[Series 2: thorax · axial · 0.62mm/px · z∈[-734,-480]mm · 12 of 151 slices shown, 15 images]
[im 12/151  mediastinal]
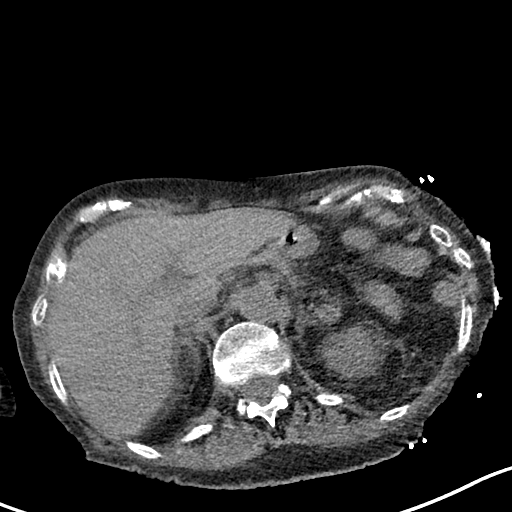
[im 12/151  lung]
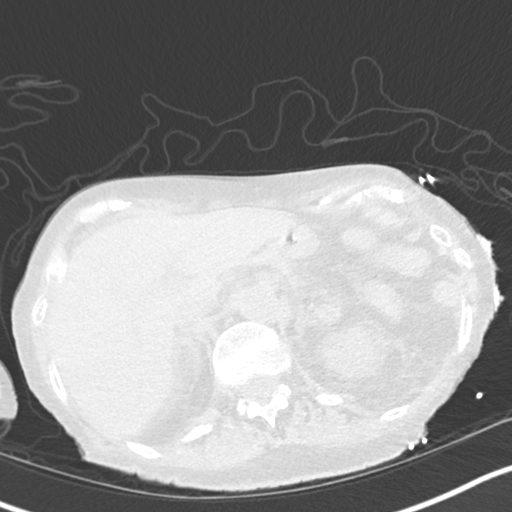
[im 24/151  lung]
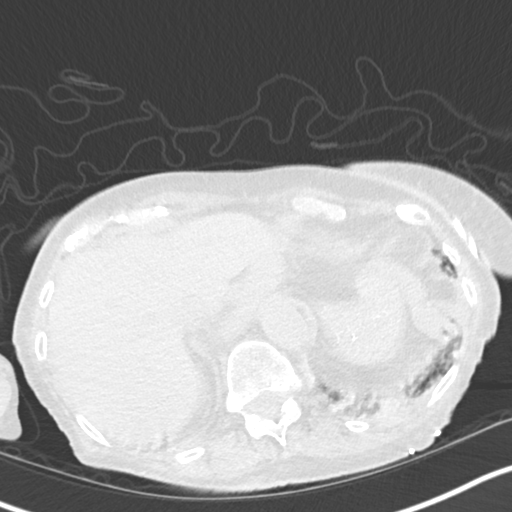
[im 35/151  lung]
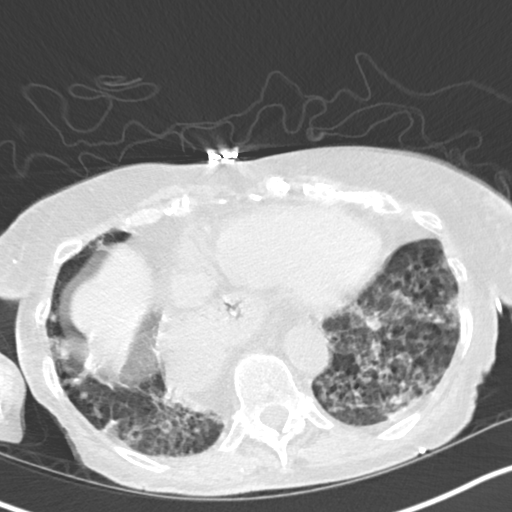
[im 47/151  lung]
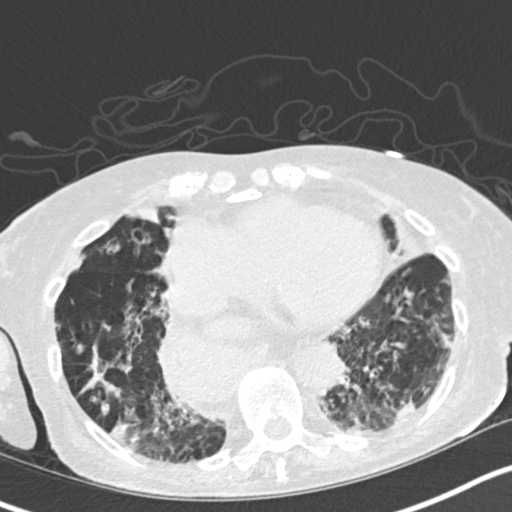
[im 58/151  mediastinal]
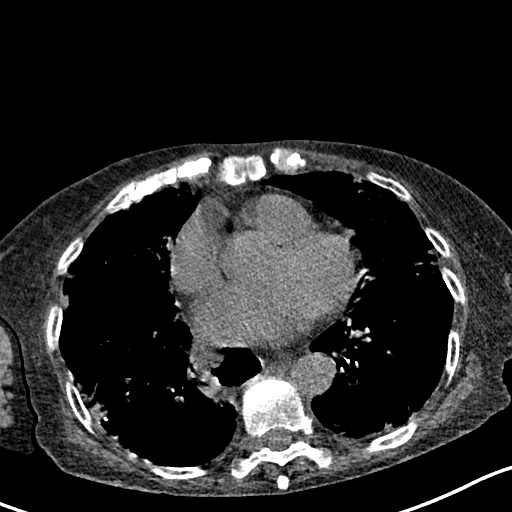
[im 58/151  lung]
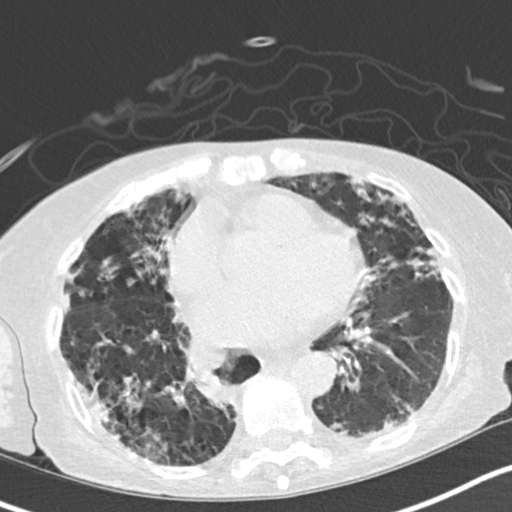
[im 70/151  lung]
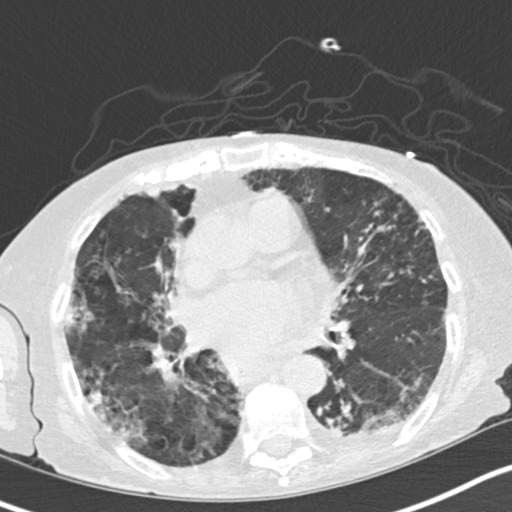
[im 81/151  lung]
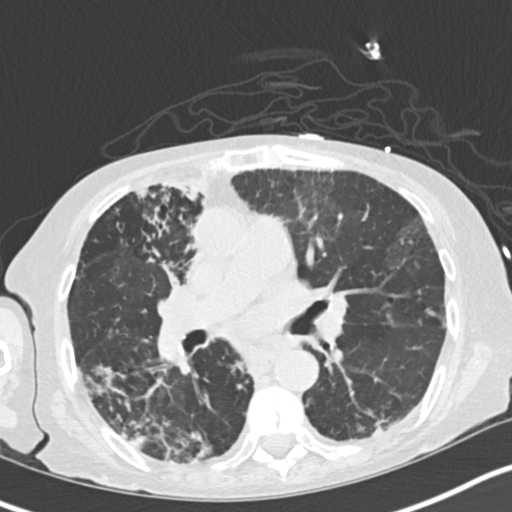
[im 93/151  lung]
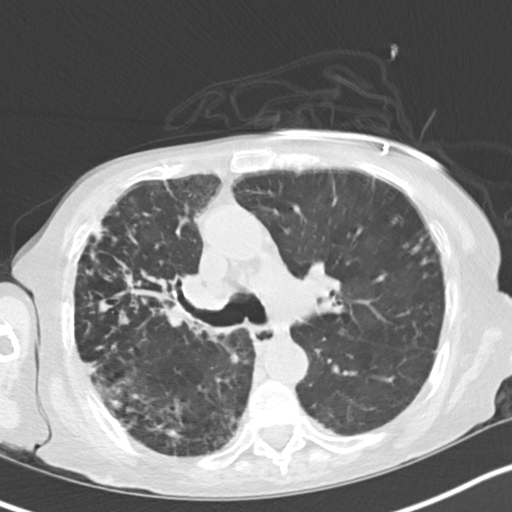
[im 104/151  mediastinal]
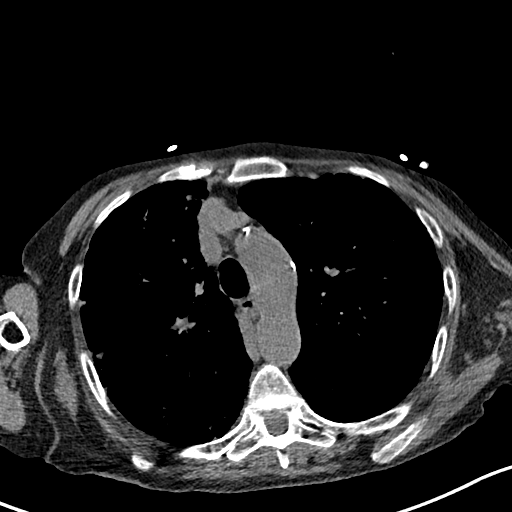
[im 104/151  lung]
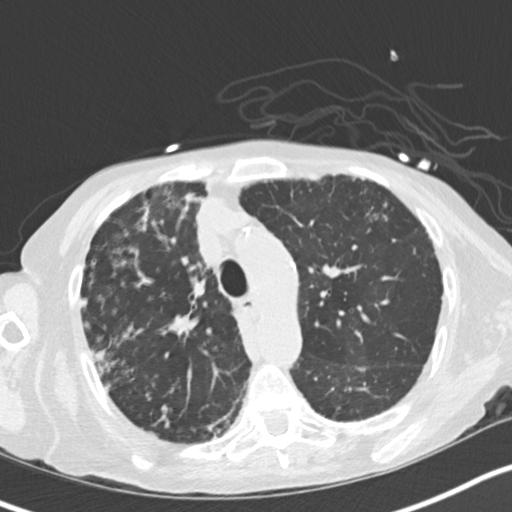
[im 116/151  lung]
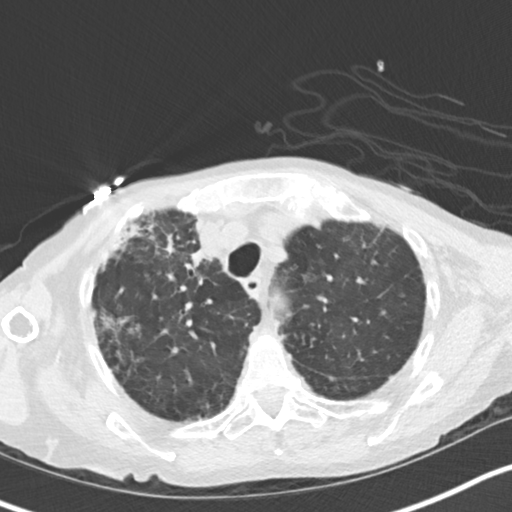
[im 127/151  lung]
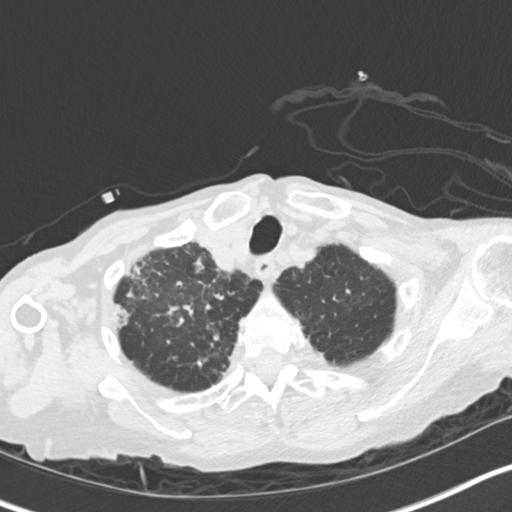
[im 139/151  lung]
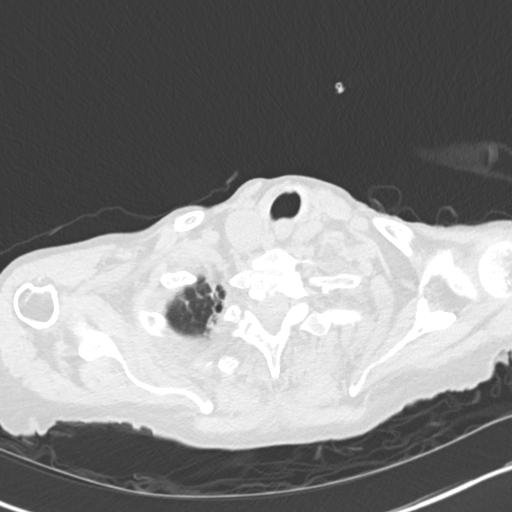

[Series 5: coronal · coronal · 0.62mm/px · 3 of 116 slices shown]
[im 24/116  lung]
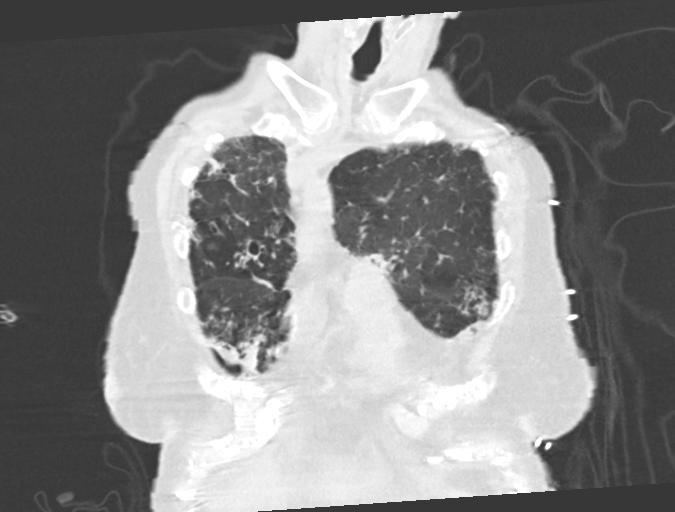
[im 47/116  lung]
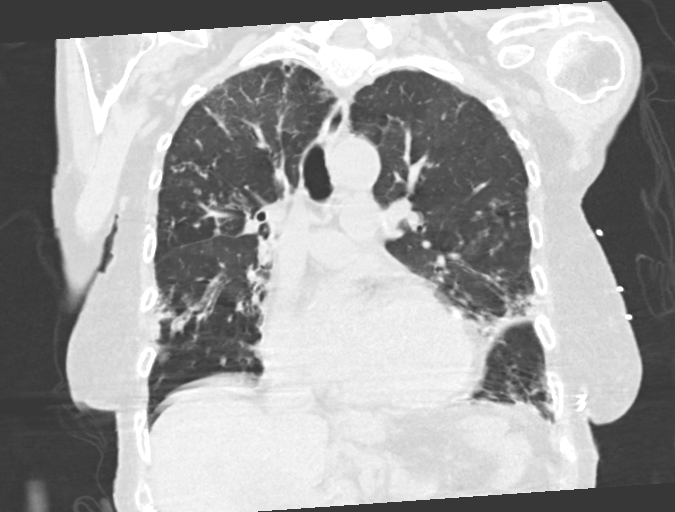
[im 70/116  lung]
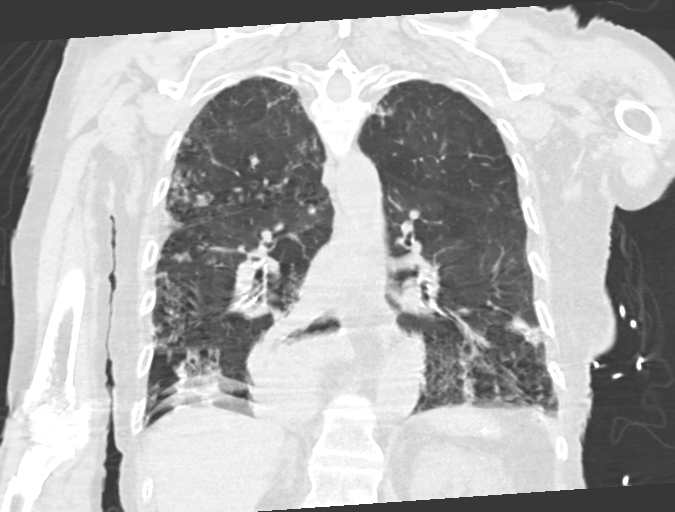

[15 of 36 positions shown; findings below may reference images not displayed]

FINDINGS: Cardiovascular: Heart size is normal. There is no significant
pericardial fluid, thickening or pericardial calcification. Aortic
atherosclerosis. No coronary artery calcifications.

Mediastinum/Nodes: No pathologically enlarged mediastinal or hilar
lymph nodes. Please note that accurate exclusion of hilar adenopathy
is limited on noncontrast CT scans. Large hiatal hernia. No axillary
lymphadenopathy.

Lungs/Pleura: Study is limited by considerable patient respiratory
motion. With these limitations in mind, there are widespread areas
of cylindrical, varicose and occasional cystic bronchiectasis, with
extensive thickening of the peribronchovascular interstitium,
regional areas of architectural distortion, and extensive areas of
peribronchovascular micro and macronodularity, most compatible with
infected areas of mucoid impaction. No pleural effusions.

Upper Abdomen: Aortic atherosclerosis.

Musculoskeletal: There are no aggressive appearing lytic or blastic
lesions noted in the visualized portions of the skeleton.
IMPRESSION: 1. The appearance of the lungs is most compatible with a chronic
indolent atypical infectious process such as EECKHOUT (mycobacterium
avium intracellulare). Given the widespread micro and
macronodularity, acute superinfection is not excluded.
2. Large hiatal hernia.
3. Aortic atherosclerosis.

Aortic Atherosclerosis ([T1]-[T1]).

## 2021-07-20 IMAGING — CT CT ABD-PELV W/O CM
2 of 4 series · 16 of 46 positions shown, 18 images · non-contrast
Comparison: None.

CLINICAL DATA: Altered mental status with fever, tachycardia and
tachypnea.



[Series 2: routine abd/pel wo · axial · 0.70mm/px · z∈[-1143,-753]mm · 13 of 86 slices shown, 15 images]
[im 4/86  soft-tissue]
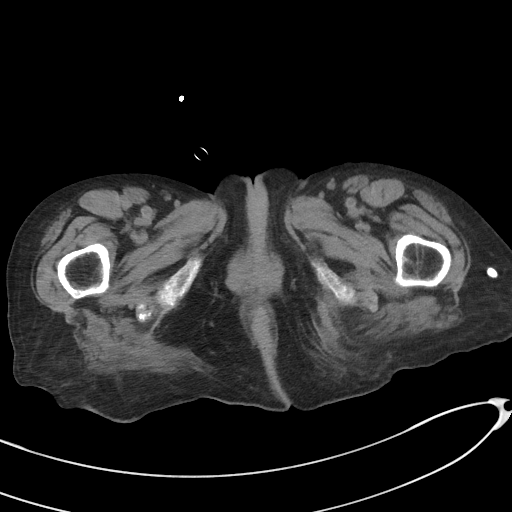
[im 4/86  bone]
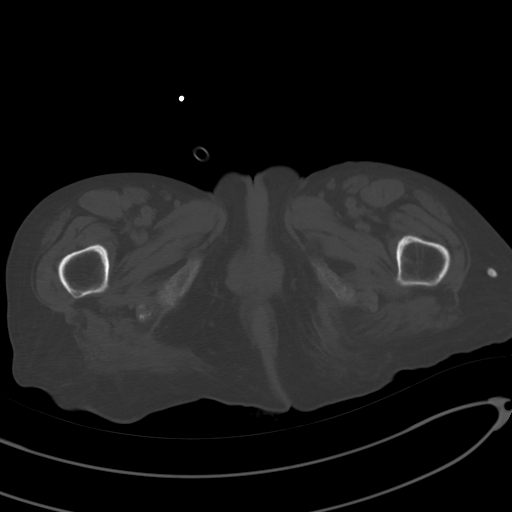
[im 11/86  soft-tissue]
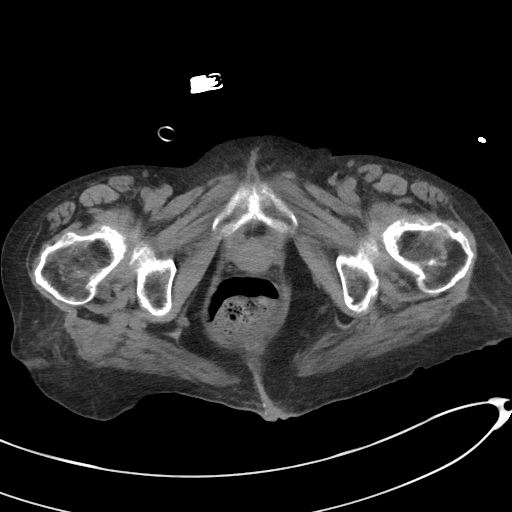
[im 18/86  soft-tissue]
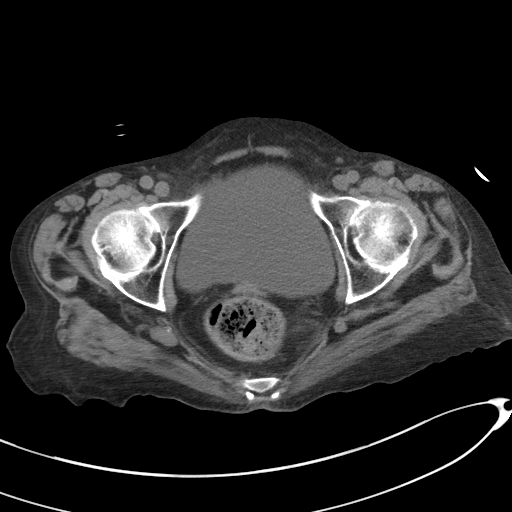
[im 25/86  soft-tissue]
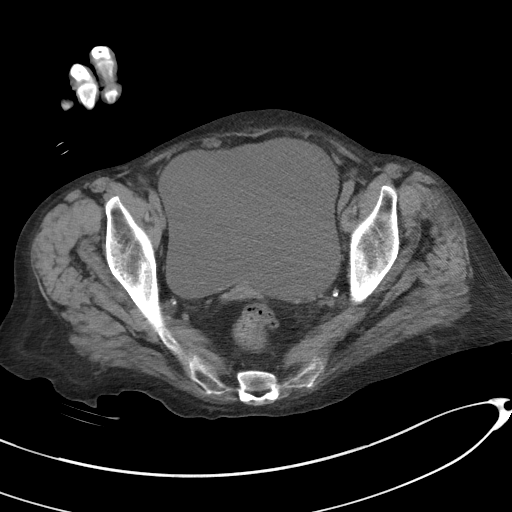
[im 29/86  soft-tissue]
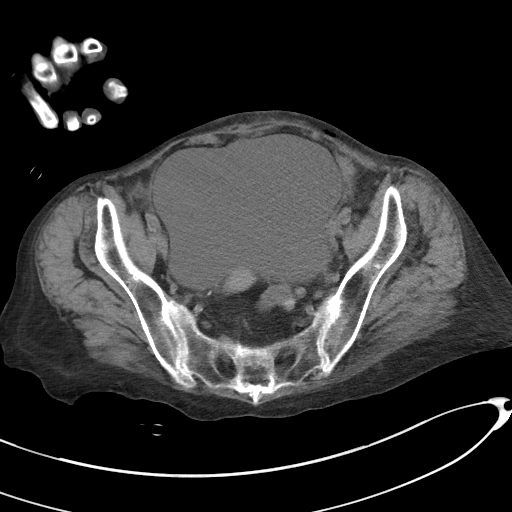
[im 36/86  soft-tissue]
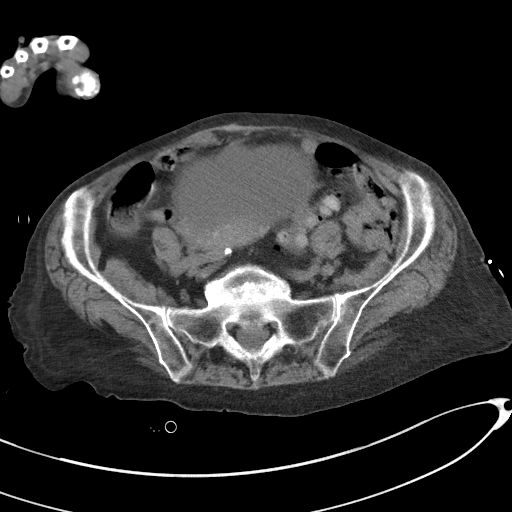
[im 43/86  soft-tissue]
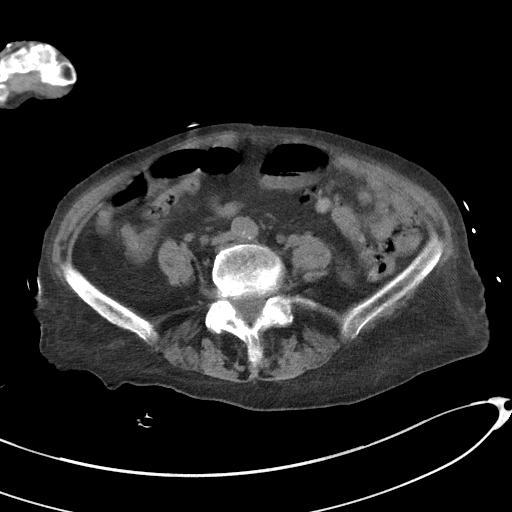
[im 50/86  soft-tissue]
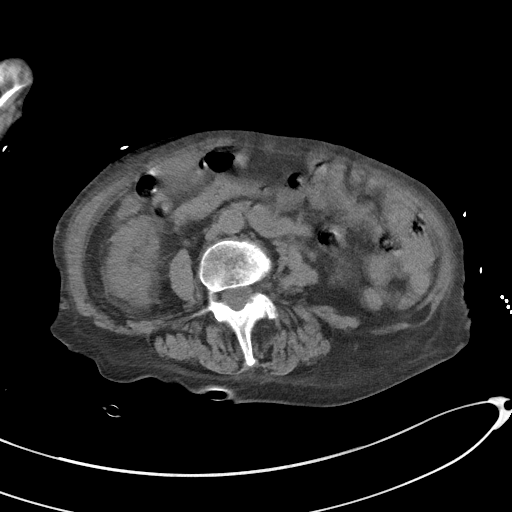
[im 57/86  soft-tissue]
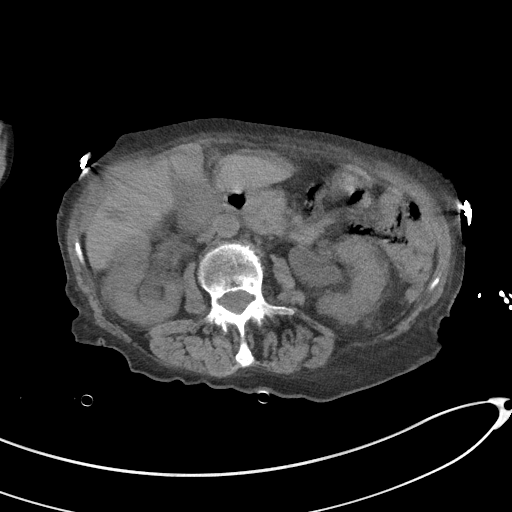
[im 57/86  bone]
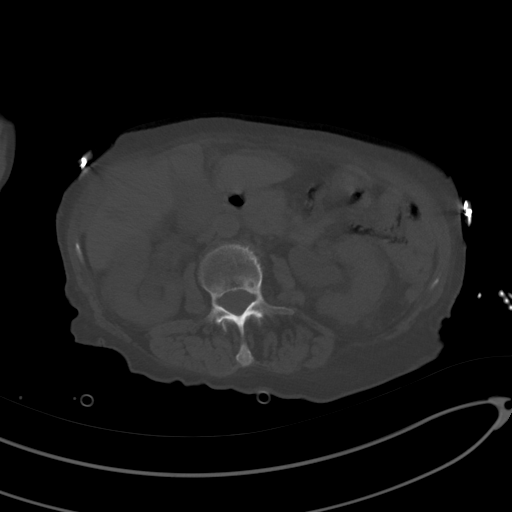
[im 61/86  soft-tissue]
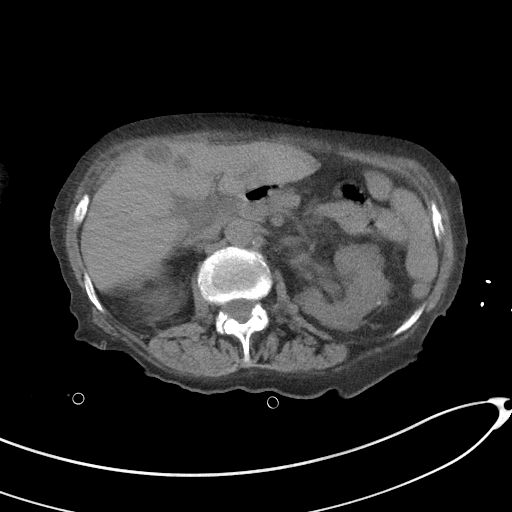
[im 68/86  soft-tissue]
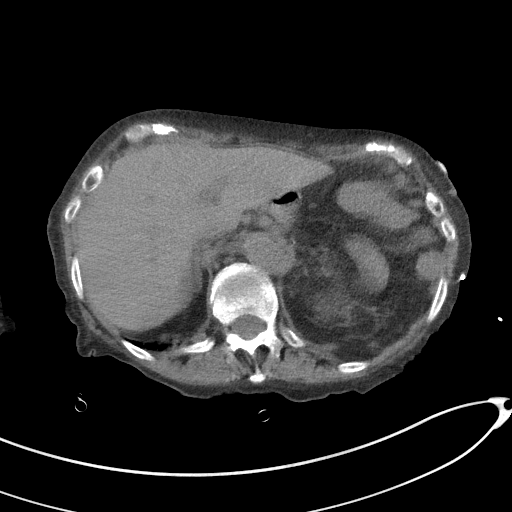
[im 75/86  soft-tissue]
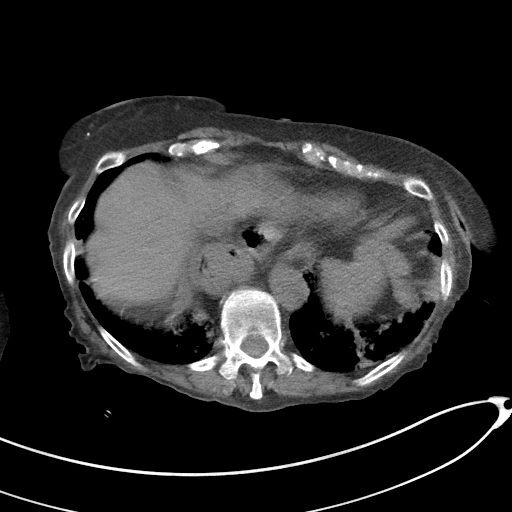
[im 82/86  soft-tissue]
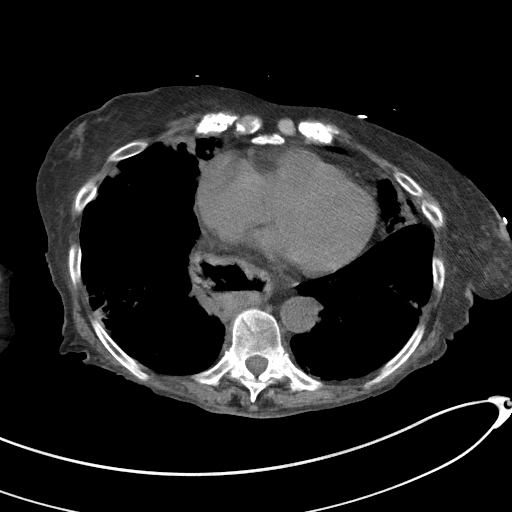

[Series 5: coronal st · coronal · 0.75mm/px · 3 of 79 slices shown]
[im 27/79  soft-tissue]
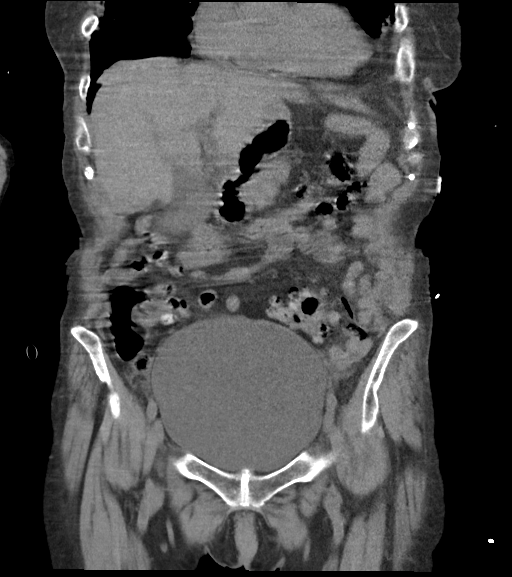
[im 35/79  soft-tissue]
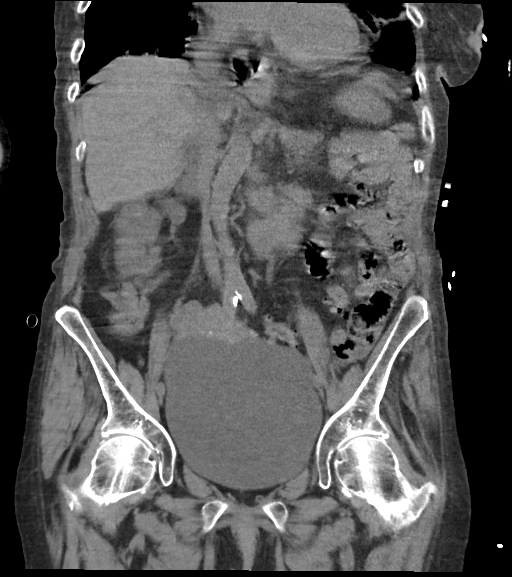
[im 44/79  soft-tissue]
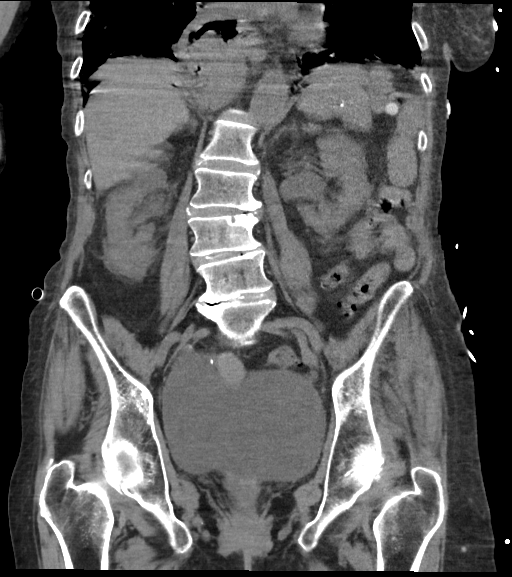

[16 of 46 positions shown; findings below may reference images not displayed]

FINDINGS: Lower chest: The study is markedly limited secondary to patient
motion.

Marked severity multifocal infiltrates and extensive bronchiectasis
are seen throughout both lung bases.

Hepatobiliary: 13 mm and 18 mm diameter cysts are seen within the
anterior aspect of the right lobe of the liver. A 10 mm cystic
appearing area is seen within the inferior aspect of the right lobe.
There is mild central intrahepatic biliary dilatation. The
gallbladder is mildly distended without evidence of gallstones,
gallbladder wall thickening or pericholecystic fluid.

Pancreas: Unremarkable. No pancreatic ductal dilatation or
surrounding inflammatory changes.

Spleen: Punctate calcified granulomas are seen scattered throughout
the parenchyma of a small spleen.

Adrenals/Urinary Tract: Adrenal glands are unremarkable. Kidneys are
normal in size. A 2.0 cm diameter cyst is seen along the posterior
aspect of the mid left kidney. There is marked severity bilateral
hydronephrosis and proximal hydroureter without obstructing renal
calculi. Bilateral nonspecific perinephric inflammatory fat
stranding is seen. The urinary bladder is markedly distended.

Stomach/Bowel: There is a large hiatal hernia. Appendix appears
normal. No evidence of bowel wall thickening, distention, or
inflammatory changes. Noninflamed diverticula are seen throughout
the large bowel.

Vascular/Lymphatic: Mild aortic atherosclerosis. No enlarged
abdominal or pelvic lymph nodes.

Reproductive: Uterus and bilateral adnexa are unremarkable.

Other: No abdominal wall hernia or abnormality. No abdominopelvic
ascites.

Musculoskeletal: Marked severity multilevel degenerative changes are
seen throughout the lumbar spine.
IMPRESSION: 1. Marked severity bibasilar multifocal infiltrates.
2. Marked severity bilateral hydronephrosis and proximal hydroureter
in the setting of a markedly distended urinary bladder.
3. Large hiatal hernia.
4. Colonic diverticulosis.
5. Marked severity multilevel degenerative changes throughout the
lumbar spine.
6. Aortic atherosclerosis.

Aortic Atherosclerosis ([EE]-[EE]).

## 2021-07-20 MED ORDER — IPRATROPIUM-ALBUTEROL 0.5-2.5 (3) MG/3ML IN SOLN
3.0000 mL | Freq: Four times a day (QID) | RESPIRATORY_TRACT | Status: DC | PRN
  Administered 2021-07-20: 3 mL via RESPIRATORY_TRACT
  Filled 2021-07-20 (×2): qty 3

## 2021-07-20 MED ORDER — VERAPAMIL HCL ER 120 MG PO TBCR
120.0000 mg | EXTENDED_RELEASE_TABLET | Freq: Every day | ORAL | Status: DC
  Filled 2021-07-20 (×2): qty 1

## 2021-07-20 MED ORDER — METOPROLOL SUCCINATE ER 50 MG PO TB24
25.0000 mg | ORAL_TABLET | Freq: Every day | ORAL | Status: DC
  Administered 2021-07-21: 25 mg via ORAL
  Filled 2021-07-20: qty 1

## 2021-07-20 MED ORDER — MORPHINE SULFATE (PF) 2 MG/ML IV SOLN
2.0000 mg | INTRAVENOUS | Status: DC | PRN
  Administered 2021-07-20: 2 mg via INTRAVENOUS
  Filled 2021-07-20: qty 1

## 2021-07-20 MED ORDER — LEVOTHYROXINE SODIUM 50 MCG PO TABS: 75.0000 ug | ORAL_TABLET | Freq: Every day | ORAL | Status: DC

## 2021-07-20 MED ORDER — LEVOFLOXACIN IN D5W 500 MG/100ML IV SOLN
500.0000 mg | INTRAVENOUS | Status: DC
  Administered 2021-07-20: 500 mg via INTRAVENOUS
  Filled 2021-07-20: qty 100

## 2021-07-20 MED ORDER — IPRATROPIUM-ALBUTEROL 0.5-2.5 (3) MG/3ML IN SOLN
3.0000 mL | Freq: Four times a day (QID) | RESPIRATORY_TRACT | Status: DC | PRN
  Administered 2021-07-21: 3 mL via RESPIRATORY_TRACT

## 2021-07-20 MED ORDER — METOPROLOL TARTRATE 5 MG/5ML IV SOLN
5.0000 mg | INTRAVENOUS | Status: DC | PRN
  Filled 2021-07-20: qty 5

## 2021-07-20 MED ORDER — HYDRALAZINE HCL 20 MG/ML IJ SOLN
10.0000 mg | Freq: Once | INTRAMUSCULAR | Status: AC
  Administered 2021-07-20: 10 mg via INTRAVENOUS
  Filled 2021-07-20: qty 1

## 2021-07-20 MED ORDER — METOPROLOL TARTRATE 5 MG/5ML IV SOLN
5.0000 mg | INTRAVENOUS | Status: AC | PRN
  Administered 2021-07-20 – 2021-07-21 (×3): 5 mg via INTRAVENOUS
  Filled 2021-07-20 (×2): qty 5

## 2021-07-20 NOTE — ED Notes (Signed)
Pt arrived per EMS from her home where she lives independently. Family unable to get a hold of her today. Found laying on the floor. Altered and confused. Per EMS, hypertensive. Sinus Tachycardiac with HR 110's. Blood Sugar 152. Upon arrival to the ED patient is altered, confused. Only orientated to self. Agitated, repositioning herself frequently in bed. Un-redirectable at this time. Does not follow commands. Rigors. States "I'm cold." Attempting to cover herself. Noted to have dried stool to her face, legs, and buttocks. Pt was given partial bed bath. Febrile with rectal temp of 100.6. RR 30's.  ? ?

## 2021-07-20 NOTE — ED Notes (Signed)
Pt calling out "help, help me, "it hurts" but unable to localize pain. Family member says pt having pain on whole L side and asking if pt was scanned for possible rib fx. Asking for pain meds and med req. Informed that already reached out to attending for IV pain meds and IV BP meds. Family stating that med req has not been done yet.  ?

## 2021-07-20 NOTE — ED Notes (Signed)
1000 troponin drawn and sent to lab. ?

## 2021-07-20 NOTE — Progress Notes (Addendum)
SLP Cancellation Note ? ?Patient Details ?Name: Lauren Conner ?MRN: [id number] ?DOB: Sep 29, 1940 ? ? ?Cancelled treatment:       Reason Eval/Treat Not Completed: Medical issues which prohibited therapy ? ?SLP consult received and appreciated. Chart review completed. SLP evaluated deferred at this time given AMS (including inability to follow commands), tachypnea (RR in 30s), and tachycardia (HR in 110s) with atrial flutter (per RN). Recommend NPO with consideration for short term alternate route of nutrition/hydration/medication ? ?RN aware and in agreement. MD made aware. ? ?Cherrie Gauze, M.S., CCC-SLP ?Speech-Language Pathologist ?[facility] ?[phone] ([place]) ? ?[name] ?07/20/2021, 1:30 PM ?

## 2021-07-20 NOTE — ED Notes (Signed)
Pt sleeping. Awakens to verbal and light physical stimulation. Orientated to person. States her first, last name. States Month and day of birth. States she is in [place]. States she is here because she is "sick." Intermittently follows commands. Would follow commands on the R upper extremity. Intermittently following commands on the L arm. Patient would not hold up L arm when asked but would spontaneously move L arm. RR have been rapid throughout the night up into the 40's at times. O2 at 2 lpm for comfort. Does not need oxygen to maintain sats. Oxygen saturations > 95% while on RA. ? ?Pt is unable to communicate how she is feeling. She repeats herself with simple answers to questions. Requiring a lot of prompting with answering of questions. Stating "I don't know" or "I am sick." C/o abd pain earlier in the night. Morphine given x 1 dose. Pt seemed to rest after medication given. Less agitation compared to her arrival to the ED.  ? ?Hypertensive most of the night. SBP 170-180. Hydralazine given x 2 doses. SBP 150's after giving 10 mg of Hydralazine.  ? ?Tolerated a glass of water.  ?

## 2021-07-20 NOTE — Assessment & Plan Note (Signed)
CT abdomen done following admission showed markedly distended bladder with bilateral hydronephrosis and hydroureter.  No definite obstruction ?Foley catheter placed ?

## 2021-07-20 NOTE — Consult Note (Signed)
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  She was admitted with altered mental status and confusion was unable to provide details of history however was noted to be mildly febrile and tachycardic and tachypneic.  She was found to have leukocytosis with elevated lactate and metabolic acidosis [id number] and influenza testing was negative on admission she was started empirically on antibiotics including cefepime Vanco and Flagyl and admitted to hospitalist service patient did have chest imaging on arrival including CT chest without contrast which I reviewed personally.  Pictorial documentation of chest imaging is included below and does show that significant motion artifact bilaterally however notable cylindrical and saccular bronchiectatic changes with chronic bronchitic airways bilaterally with mucoid impaction and bibasilar fibrotic changes.  Additionally there is a large hiatal hernia.  Patient also did have CT abdomen which I reviewed and shows bilateral hydronephrosis and proximal hydroureter.   PAST MEDICAL HISTORY   History reviewed. No pertinent past medical history.   SURGICAL HISTORY   History reviewed. No pertinent surgical history.   FAMILY HISTORY   History reviewed. No pertinent family history.   SOCIAL HISTORY        MEDICATIONS    Home Medication:  Current Outpatient Rx   Order #: [id number] Class: Historical Med   Order #: [id number] Class: Historical Med   Order #: [id number] Class: Historical Med   Order #: [id number] Class: Historical Med   Order #: [id number] Class: Historical Med   Order #: [id number] Class: Historical Med   Order #: [id number] Class: Historical Med    Current Medication:  Current Facility-Administered Medications:    acetaminophen (TYLENOL) tablet 650 mg, 650 mg, Oral, Q6H PRN, 650 mg at 07/20/21 0007 **OR** acetaminophen (TYLENOL) suppository 650 mg, 650 mg, Rectal, Q6H PRN, [name], MD, 650 mg at 07/20/21 1142   ceFEPIme (MAXIPIME) 2 g in sodium chloride 0.9 % 100 mL IVPB, 2 g, Intravenous, Q24H, Damita Dunnings, Hazel V, MD   enoxaparin (LOVENOX) injection 30 mg, 30 mg, Subcutaneous, Q24H, Judd Gaudier V, MD, 30 mg at 07/19/21 2345   hydrALAZINE (APRESOLINE) injection 5 mg, 5 mg, Intravenous, Q4H PRN, Judd Gaudier V, MD, 5 mg at 07/20/21 1347   ipratropium-albuterol (DUONEB) 0.5-2.5 (3) MG/3ML nebulizer solution 3 mL, 3 mL, Nebulization, Q6H PRN, Wouk, Ailene Rud, MD, 3 mL at 07/20/21 1347   lactated ringers infusion, , Intravenous, Continuous, Wouk, Ailene Rud, MD, Stopped at 07/20/21 1540   levothyroxine (SYNTHROID) tablet 75 mcg, 75 mcg, Oral, Daily, Wouk, Ailene Rud, MD   metoprolol succinate (TOPROL-XL) 24 hr tablet 25 mg, 25 mg, Oral, Daily, Wouk, Ailene Rud, MD   metoprolol tartrate (LOPRESSOR) injection 5 mg, 5 mg, Intravenous, Q5 min PRN, Wouk, Ailene Rud, MD, 5 mg at 07/20/21 1346   ondansetron (ZOFRAN) tablet 4 mg, 4 mg, Oral,  Q6H PRN **OR** ondansetron (ZOFRAN) injection 4 mg, 4 mg, Intravenous, Q6H PRN, [name], MD   verapamil (CALAN-SR) CR tablet 120 mg, 120 mg, Oral, Daily, Wouk, Ailene Rud, MD  Current Outpatient Medications:    acetaminophen (TYLENOL) 650 MG CR tablet, Take 650 mg by mouth every 6 (six) hours., Disp: , Rfl:    albuterol  (PROVENTIL) (2.5 MG/3ML) 0.083% nebulizer solution, Take 2.5 mg by nebulization every 6 (six) hours as needed for wheezing., Disp: , Rfl:    albuterol (VENTOLIN HFA) 108 (90 Base) MCG/ACT inhaler, Inhale 2 puffs into the lungs every 4 (four) hours as needed for wheezing., Disp: , Rfl:    fluticasone-salmeterol (ADVAIR DISKUS) 250-50 MCG/ACT AEPB, Inhale 1 puff into the lungs every 12 (twelve) hours., Disp: , Rfl:    levothyroxine (SYNTHROID) 75 MCG tablet, Take 75 mcg by mouth daily., Disp: , Rfl:    metoprolol succinate (TOPROL-XL) 25 MG 24 hr tablet, Take 25 mg by mouth daily., Disp: , Rfl:    verapamil (CALAN-SR) 120 MG CR tablet, Take 120 mg by mouth daily., Disp: , Rfl:     ALLERGIES   Ethambutol and Iodinated contrast media     REVIEW OF SYSTEMS    Review of Systems:  Gen:  Denies  fever, sweats, chills weigh loss  HEENT: Denies blurred vision, double vision, ear pain, eye pain, hearing loss, nose bleeds, sore throat Cardiac:  No dizziness, chest pain or heaviness, chest tightness,edema Resp:   Denies cough or sputum porduction, shortness of breath,wheezing, hemoptysis,  Gi: Denies swallowing difficulty, stomach pain, nausea or vomiting, diarrhea, constipation, bowel incontinence Gu:  Denies bladder incontinence, burning urine Ext:   Denies Joint pain, stiffness or swelling Skin: Denies  skin rash, easy bruising or bleeding or hives Endoc:  Denies polyuria, polydipsia , polyphagia or weight change Psych:   Denies depression, insomnia or hallucinations   Other:  All other systems negative   VS: BP (!) 150/95    Pulse (!) 101    Temp 98.6 F (37 C) (Rectal)    Resp 20    Ht 5\' 3"  (1.6 m)    Wt 45.3 kg    SpO2 97%    BMI 17.69 kg/m      PHYSICAL EXAM    GENERAL:NAD, no fevers, chills, no weakness no fatigue HEAD: Normocephalic, atraumatic.  EYES: Pupils equal, round, reactive to light. Extraocular muscles intact. No scleral icterus.  MOUTH: Moist mucosal membrane.  Dentition intact. No abscess noted.  EAR, NOSE, THROAT: Clear without exudates. No external lesions.  NECK: Supple. No thyromegaly. No nodules. No JVD.  PULMONARY: Diffuse coarse rhonchi right sided +wheezes CARDIOVASCULAR: S1 and S2. Regular rate and rhythm. No murmurs, rubs, or gallops. No edema. Pedal pulses 2+ bilaterally.  GASTROINTESTINAL: Soft, nontender, nondistended. No masses. Positive bowel sounds. No hepatosplenomegaly.  MUSCULOSKELETAL: No swelling, clubbing, or edema. Range of motion full in all extremities.  NEUROLOGIC: Cranial nerves II through XII are intact. No gross focal neurological deficits. Sensation intact. Reflexes intact.  SKIN: No ulceration, lesions, rashes, or cyanosis. Skin warm and dry. Turgor intact.  PSYCHIATRIC: Mood, affect within normal limits. The patient is awake, alert and oriented x 3. Insight, judgment intact.       IMAGING    CT ABDOMEN PELVIS WO CONTRAST  Result Date: 07/20/2021 CLINICAL DATA:  Altered mental status with fever, tachycardia and tachypnea. EXAM: CT ABDOMEN AND PELVIS WITHOUT CONTRAST TECHNIQUE: Multidetector CT imaging of the abdomen and pelvis was performed following  the standard protocol without IV contrast. RADIATION DOSE REDUCTION: This exam was performed according to the departmental dose-optimization program which includes automated exposure control, adjustment of the mA and/or kV according to patient size and/or use of iterative reconstruction technique. COMPARISON:  None. FINDINGS: Lower chest: The study is markedly limited secondary to patient motion. Marked severity multifocal infiltrates and extensive bronchiectasis are seen throughout both lung bases. Hepatobiliary: 13 mm and 18 mm diameter cysts are seen within the anterior aspect of the right lobe of the liver. A 10 mm cystic appearing area is seen within the inferior aspect of the right lobe. There is mild central intrahepatic biliary dilatation. The gallbladder is mildly  distended without evidence of gallstones, gallbladder wall thickening or pericholecystic fluid. Pancreas: Unremarkable. No pancreatic ductal dilatation or surrounding inflammatory changes. Spleen: Punctate calcified granulomas are seen scattered throughout the parenchyma of a small spleen. Adrenals/Urinary Tract: Adrenal glands are unremarkable. Kidneys are normal in size. A 2.0 cm diameter cyst is seen along the posterior aspect of the mid left kidney. There is marked severity bilateral hydronephrosis and proximal hydroureter without obstructing renal calculi. Bilateral nonspecific perinephric inflammatory fat stranding is seen. The urinary bladder is markedly distended. Stomach/Bowel: There is a large hiatal hernia. Appendix appears normal. No evidence of bowel wall thickening, distention, or inflammatory changes. Noninflamed diverticula are seen throughout the large bowel. Vascular/Lymphatic: Mild aortic atherosclerosis. No enlarged abdominal or pelvic lymph nodes. Reproductive: Uterus and bilateral adnexa are unremarkable. Other: No abdominal wall hernia or abnormality. No abdominopelvic ascites. Musculoskeletal: Marked severity multilevel degenerative changes are seen throughout the lumbar spine. IMPRESSION: 1. Marked severity bibasilar multifocal infiltrates. 2. Marked severity bilateral hydronephrosis and proximal hydroureter in the setting of a markedly distended urinary bladder. 3. Large hiatal hernia. 4. Colonic diverticulosis. 5. Marked severity multilevel degenerative changes throughout the lumbar spine. 6. Aortic atherosclerosis. Aortic Atherosclerosis (ICD10-I70.0). Electronically Signed   By: Virgina Norfolk M.D.   On: 07/20/2021 02:02   CT CHEST WO CONTRAST  Result Date: 07/20/2021 CLINICAL DATA:  81 year old female with history of community-acquired pneumonia. EXAM: CT CHEST WITHOUT CONTRAST TECHNIQUE: Multidetector CT imaging of the chest was performed following the standard protocol without  IV contrast. RADIATION DOSE REDUCTION: This exam was performed according to the departmental dose-optimization program which includes automated exposure control, adjustment of the mA and/or kV according to patient size and/or use of iterative reconstruction technique. COMPARISON:  No priors. FINDINGS: Cardiovascular: Heart size is normal. There is no significant pericardial fluid, thickening or pericardial calcification. Aortic atherosclerosis. No coronary artery calcifications. Mediastinum/Nodes: No pathologically enlarged mediastinal or hilar lymph nodes. Please note that accurate exclusion of hilar adenopathy is limited on noncontrast CT scans. Large hiatal hernia. No axillary lymphadenopathy. Lungs/Pleura: Study is limited by considerable patient respiratory motion. With these limitations in mind, there are widespread areas of cylindrical, varicose and occasional cystic bronchiectasis, with extensive thickening of the peribronchovascular interstitium, regional areas of architectural distortion, and extensive areas of peribronchovascular micro and macronodularity, most compatible with infected areas of mucoid impaction. No pleural effusions. Upper Abdomen: Aortic atherosclerosis. Musculoskeletal: There are no aggressive appearing lytic or blastic lesions noted in the visualized portions of the skeleton. IMPRESSION: 1. 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 Given the widespread micro and macronodularity, acute superinfection is not excluded. 2. Large hiatal hernia. 3. Aortic atherosclerosis. Aortic Atherosclerosis (ICD10-I70.0). Electronically Signed   By: Vinnie Langton M.D.   On:  07/20/2021 09:11   DG Chest Port 1 View  Result Date: 07/19/2021 CLINICAL DATA:  Suspected sepsis.  Altered mental status. EXAM: PORTABLE CHEST 1 VIEW COMPARISON:  None. FINDINGS: The heart is enlarged. There is a  retrocardiac hiatal hernia. Patchy airspace opacities throughout the right lung and at the left lung base. There is background interstitial thickening. No significant pleural effusion. No pneumothorax. The bones are under mineralized. No acute osseous findings. IMPRESSION: 1. Patchy airspace opacities throughout the right lung and at the left lung base. Differential considerations include multifocal pneumonia, including atypical organisms, or asymmetric pulmonary edema. 2. Cardiomegaly and hiatal hernia. Electronically Signed   By: Keith Rake M.D.   On: 07/19/2021 21:52      ASSESSMENT/PLAN   Acute exacerbation of underlying bronchiectasis    -Patient does have a history of treated MAI    -There are chronic bronchitic changes with saccular bronchiectasis coupled with calcified spleen which may be seen in fungal endemic infection, as well as sarcoidosis.  -There is significant mucoid impaction bilaterally  -Patient received vancomycin, MRSA PCR is negative and this therapy has been de-escalated -We will add levofloxacin due to UTI coupled with chronic MAI infection -Exacerbation likely due to UTI sepsis    Sepsis due to urinary source Present on admission with CT abdomen showing hydronephrosis -Adding levofloxacin to antimicrobial regimen -MRSA PCR is negative may DC vancomycin -Procalcitonin likely of limited utility due to CKD as well as MAI infection which generally does not correlate well with procalcitonin   Moderate protein calorie malnutrition BMI less than 18 -Bitemporal and peripheral muscle wasting -Large sized hiatal hernia may be hindering normal GI function and contributing to microaspiration -Patient has been evaluated by SLP speech pathologist with recommendation for n.p.o. status due to encephalopathy on admission -Patient would benefit from official video swallow to elucidate severity of aspiration.    Thank you for allowing me to participate in the care of this  patient.   Patient has severe medical issues which are life threatening and being managed during this visit  Patient/Family are satisfied with care plan and all questions have been answered.  This document was prepared using Dragon voice recognition software and may include unintentional dictation errors.     Ottie Glazier, M.D.  Division of [facility]

## 2021-07-20 NOTE — ED Notes (Signed)
Informed provider trop now 81 up from 52. ?

## 2021-07-20 NOTE — ED Notes (Addendum)
RN called and spoke to [facility] on the floor about pt handoff. He sts " I will look the at the pt chart and let you know if I have any questions or concerns." ?

## 2021-07-20 NOTE — ED Notes (Signed)
Sent message to provider re: change PO rate control + BP meds to IV as pt is too altered to swallow pills. ?

## 2021-07-20 NOTE — ED Notes (Signed)
RN observed pt going in and out of what appears to be A-flutter on the monitor with systolic BP above 468. RN notified provider. ?

## 2021-07-20 NOTE — ED Notes (Signed)
Foley catheter inserted without difficulty. 1100 ml of urine output.  ?

## 2021-07-20 NOTE — Progress Notes (Signed)
PROGRESS NOTE    [name]  RFF:[id number] DOB: 1940/09/27 DOA: 07/19/2021 PCP: [facility]  Outpatient Specialists: pulmonology, cardiology, nephrology    Brief Narrative:   From admission h and p Lauren Conner is a 81 y.o. female with medical history significant for CKD4, anemia of CKD, paroxysmal SVT, hypothyroidism, bronchiectasis, HTN, who lives alone and is functionally independent who was brought in for evaluation of weakness and confusion that was noted on the day of arrival.  History is limited due to altered mental status patient appeared to complain of right-sided flank pain   Assessment & Plan:   Principal Problem:   Multifocal pneumonia Active Problems:   Essential hypertension   Adult bronchiectasis ([facility])   Hypothyroidism (acquired)   CKD (chronic kidney disease) stage 4, GFR 15-29 ml/min (HCC)   Paroxysmal SVT (supraventricular tachycardia) (HCC)   Severe sepsis ([place])   Acute metabolic encephalopathy   Anemia of chronic kidney failure, stage 4 (severe) ([facility])   Metabolic acidosis   Hyponatremia   Acute urinary retention   Bilateral hydronephrosis  # Sepsis, severe By elevated lactate, fever, tachycardia. 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 Followed by pulm but last visit was 2021. Not on o2 at home. Here septic, tachypnic. Not hypoxic but will monitor closely. Flu/covid negative - continue vanc/cefepime, check MRSA swab - f/u CT of chest - sputum for culture if able - f/u blood cultures - f/u respiratory panel - f/u urine strep and legionella antigens - pulmonology consulted for assistance in mgmt - duonebs  # Acute urinary retention Son says has a history of urinary retention. CT showing severe hydro, distended bladder, no obstructing stone. Foley placed 3/4 in the ED with 1100 ml output. Urinalysis not strongly suggestive  of infection - vanc/cefepime as above - maintain Foley - f/u urine culture  # Acute metabolic encephalopathy 2/2 primarily to sepsis - treat underlying cause  # Hyponatremia Subacute on chronic. Baseline is around 130, here 126 likely 2/2 dehydration from sepsis, also possible aki - continue fluids and trend  # Metabolic acidosis 2/2 infection - fluids, trend  # Elevated troponin No EKG changes to suggest acs. Likely demand. 33>55 - continue to trend - maintain tele  # Hypothyroid - cont home levothyroxine  # HTN Here hypertensive - resume home metoprolol, verapamil  # Hx pSVT - resume home metop, verapamil  # CKD stage 4 Appears most recent creatinine is around 2 and here 2.13 - monitor - fluids as above, may improve some    DVT prophylaxis: lovenox Code Status: full Family Communication: son updated @ bedside 3/5  Level of care: Progressive Status is: Inpatient Remains inpatient appropriate because: severity of illness    Consultants:  pulm  Procedures: none  Antimicrobials:  Vanc/cefepime    Subjective: Opens eyes but doesn't respond  Objective: Vitals:   07/20/21 0445 07/20/21 0500 07/20/21 0515 07/20/21 0530  BP:  (!) 147/62  (!) 167/75  Pulse: (!) 105 (!) 109 (!) 105 (!) 110  Resp: (!) 33 (!) 43 (!) 35 (!) 47  Temp:      TempSrc:      SpO2: 100% 99% 99% 99%  Weight:      Height:        Intake/Output Summary (Last 24 hours) at 07/20/2021 0828 Last data filed at 07/20/2021 0743 Gross per 24 hour  Intake  2300 ml  Output 1100 ml  Net 1200 ml   Filed Weights   07/19/21 2312  Weight: 45.3 kg    Examination:  General exam: Appears calm Respiratory system: tachypnic, scattered rhonchi Cardiovascular system: S1 & S2 heard, tachycardic. No JVD, murmurs, rubs, gallops or clicks. No pedal edema. Gastrointestinal system: Abdomen is nondistended, soft and nontender. No organomegaly or masses felt. Normal bowel sounds heard. Central  nervous system: Opens eyes to voice, doesn't respond. Moves all 4 ext Extremities: warm Skin: No rashes, lesions or ulcers Psychiatry: unable to assess   Data Reviewed: I have personally reviewed following labs and imaging studies  CBC: Recent Labs  Lab 07/19/21 2120  WBC 17.1*  HGB 10.2*  HCT 31.8*  MCV 95.5  PLT 161   Basic Metabolic Panel: Recent Labs  Lab 07/19/21 2200 07/20/21 0745  NA 126* 129*  K 4.7 4.5  CL 97* 102  CO2 17* 16*  GLUCOSE 157* 116*  BUN 41* 39*  CREATININE 2.13* 2.07*  CALCIUM 8.9 8.3*   GFR: Estimated Creatinine Clearance: 15.8 mL/min (A) (by C-G formula based on SCr of 2.07 mg/dL (H)). Liver Function Tests: Recent Labs  Lab 07/19/21 2200  AST 35  ALT 15  ALKPHOS 68  BILITOT 0.7  PROT 7.9  ALBUMIN 3.1*   No results for input(s): LIPASE, AMYLASE in the last 168 hours. No results for input(s): AMMONIA in the last 168 hours. Coagulation Profile: Recent Labs  Lab 07/20/21 0016  INR 1.2   Cardiac Enzymes: No results for input(s): CKTOTAL, CKMB, CKMBINDEX, TROPONINI in the last 168 hours. BNP (last 3 results) No results for input(s): PROBNP in the last 8760 hours. HbA1C: No results for input(s): HGBA1C in the last 72 hours. CBG: No results for input(s): GLUCAP in the last 168 hours. Lipid Profile: No results for input(s): CHOL, HDL, LDLCALC, TRIG, CHOLHDL, LDLDIRECT in the last 72 hours. Thyroid Function Tests: No results for input(s): TSH, T4TOTAL, FREET4, T3FREE, THYROIDAB in the last 72 hours. Anemia Panel: No results for input(s): VITAMINB12, FOLATE, FERRITIN, TIBC, IRON, RETICCTPCT in the last 72 hours. Urine analysis:    Component Value Date/Time   COLORURINE STRAW (A) 07/19/2021 2117   APPEARANCEUR CLEAR (A) 07/19/2021 2117   LABSPEC 1.012 07/19/2021 2117   PHURINE 6.0 07/19/2021 2117   GLUCOSEU NEGATIVE 07/19/2021 2117   HGBUR SMALL (A) 07/19/2021 2117   BILIRUBINUR NEGATIVE 07/19/2021 2117   KETONESUR NEGATIVE  07/19/2021 2117   PROTEINUR >=300 (A) 07/19/2021 2117   NITRITE NEGATIVE 07/19/2021 2117   LEUKOCYTESUR NEGATIVE 07/19/2021 2117   Sepsis Labs: _0 (procalcitonin:4,lacticidven:4)  ) Recent Results (from the past 240 hour(s))  Resp Panel by RT-PCR (Flu A&B, Covid) Nasopharyngeal Swab     Status: None   Collection Time: 07/19/21  9:17 PM   Specimen: Nasopharyngeal Swab; Nasopharyngeal(NP) swabs in vial transport medium  Result Value Ref Range Status   SARS Coronavirus 2 by RT PCR NEGATIVE NEGATIVE Final    Comment: (NOTE) SARS-CoV-2 target nucleic acids are NOT DETECTED.  The SARS-CoV-2 RNA is generally detectable in upper respiratory specimens during the acute phase of infection. The lowest concentration of SARS-CoV-2 viral copies this assay can detect is 138 copies/mL. A negative result does not preclude SARS-Cov-2 infection and should not be used as the sole basis for treatment or other patient management decisions. A negative result may occur with  improper specimen collection/handling, submission of specimen other than nasopharyngeal swab, presence of viral mutation(s) within the areas targeted by this  assay, and inadequate number of viral copies(<138 copies/mL). A negative result must be combined with clinical observations, patient history, and epidemiological information. The expected result is Negative.  Fact Sheet for Patients:  EntrepreneurPulse.com.au  Fact Sheet for Healthcare Providers:  IncredibleEmployment.be  This test is no t yet approved or cleared by the [place] FDA and  has been authorized for detection and/or diagnosis of SARS-CoV-2 by FDA under an Emergency Use Authorization (EUA). This EUA will remain  in effect (meaning this test can be used) for the duration of the COVID-19 declaration under Section 564(b)(1) of the Act, 21 U.S.C.section 360bbb-3(b)(1), unless the authorization is terminated  or revoked  sooner.       Influenza A by PCR NEGATIVE NEGATIVE Final   Influenza B by PCR NEGATIVE NEGATIVE Final    Comment: (NOTE) The Xpert Xpress SARS-CoV-2/FLU/RSV plus assay is intended as an aid in the diagnosis of influenza from Nasopharyngeal swab specimens and should not be used as a sole basis for treatment. Nasal washings and aspirates are unacceptable for Xpert Xpress SARS-CoV-2/FLU/RSV testing.  Fact Sheet for Patients: EntrepreneurPulse.com.au  Fact Sheet for Healthcare Providers: IncredibleEmployment.be  This test is not yet approved or cleared by the [place] FDA and has been authorized for detection and/or diagnosis of SARS-CoV-2 by FDA under an Emergency Use Authorization (EUA). This EUA will remain in effect (meaning this test can be used) for the duration of the COVID-19 declaration under Section 564(b)(1) of the Act, 21 U.S.C. section 360bbb-3(b)(1), unless the authorization is terminated or revoked.  Performed at [facility], [address] [place], [place] [address]   Culture, blood (Routine x 2)     Status: None (Preliminary result)   Collection Time: 07/19/21  9:23 PM   Specimen: BLOOD  Result Value Ref Range Status   Specimen Description BLOOD RIGHT ANTECUBITAL  Final   Special Requests   Final    BOTTLES DRAWN AEROBIC AND ANAEROBIC Blood Culture adequate volume   Culture   Final    NO GROWTH < 12 HOURS Performed at [facility], [address] [place], [place] [address]    Report Status PENDING  Incomplete  Culture, blood (Routine x 2)     Status: None (Preliminary result)   Collection Time: 07/19/21  9:28 PM   Specimen: BLOOD  Result Value Ref Range Status   Specimen Description BLOOD BLOOD LEFT FOREARM  Final   Special Requests   Final    BOTTLES DRAWN AEROBIC AND ANAEROBIC Blood Culture results may not be optimal due to an excessive volume of blood received in culture bottles   Culture    Final    NO GROWTH < 12 HOURS Performed at [facility], [address], [place], [place] [address]    Report Status PENDING  Incomplete         Radiology Studies: CT ABDOMEN PELVIS WO CONTRAST  Result Date: 07/20/2021 CLINICAL DATA:  Altered mental status with fever, tachycardia and tachypnea. EXAM: CT ABDOMEN AND PELVIS WITHOUT CONTRAST TECHNIQUE: Multidetector CT imaging of the abdomen and pelvis was performed following the standard protocol without IV contrast. RADIATION DOSE REDUCTION: This exam was performed according to the departmental dose-optimization program which includes automated exposure control, adjustment of the mA and/or kV according to patient size and/or use of iterative reconstruction technique. COMPARISON:  None. FINDINGS: Lower chest: The study is markedly limited secondary to patient motion. Marked severity multifocal infiltrates and extensive bronchiectasis are seen throughout both lung bases.  Hepatobiliary: 13 mm and 18 mm diameter cysts are seen within the anterior aspect of the right lobe of the liver. A 10 mm cystic appearing area is seen within the inferior aspect of the right lobe. There is mild central intrahepatic biliary dilatation. The gallbladder is mildly distended without evidence of gallstones, gallbladder wall thickening or pericholecystic fluid. Pancreas: Unremarkable. No pancreatic ductal dilatation or surrounding inflammatory changes. Spleen: Punctate calcified granulomas are seen scattered throughout the parenchyma of a small spleen. Adrenals/Urinary Tract: Adrenal glands are unremarkable. Kidneys are normal in size. A 2.0 cm diameter cyst is seen along the posterior aspect of the mid left kidney. There is marked severity bilateral hydronephrosis and proximal hydroureter without obstructing renal calculi. Bilateral nonspecific perinephric inflammatory fat stranding is seen. The urinary bladder is markedly distended. Stomach/Bowel: There is a large  hiatal hernia. Appendix appears normal. No evidence of bowel wall thickening, distention, or inflammatory changes. Noninflamed diverticula are seen throughout the large bowel. Vascular/Lymphatic: Mild aortic atherosclerosis. No enlarged abdominal or pelvic lymph nodes. Reproductive: Uterus and bilateral adnexa are unremarkable. Other: No abdominal wall hernia or abnormality. No abdominopelvic ascites. Musculoskeletal: Marked severity multilevel degenerative changes are seen throughout the lumbar spine. IMPRESSION: 1. Marked severity bibasilar multifocal infiltrates. 2. Marked severity bilateral hydronephrosis and proximal hydroureter in the setting of a markedly distended urinary bladder. 3. Large hiatal hernia. 4. Colonic diverticulosis. 5. Marked severity multilevel degenerative changes throughout the lumbar spine. 6. Aortic atherosclerosis. Aortic Atherosclerosis (ICD10-I70.0). Electronically Signed   By: Virgina Norfolk M.D.   On: 07/20/2021 02:02   DG Chest Port 1 View  Result Date: 07/19/2021 CLINICAL DATA:  Suspected sepsis.  Altered mental status. EXAM: PORTABLE CHEST 1 VIEW COMPARISON:  None. FINDINGS: The heart is enlarged. There is a retrocardiac hiatal hernia. Patchy airspace opacities throughout the right lung and at the left lung base. There is background interstitial thickening. No significant pleural effusion. No pneumothorax. The bones are under mineralized. No acute osseous findings. IMPRESSION: 1. Patchy airspace opacities throughout the right lung and at the left lung base. Differential considerations include multifocal pneumonia, including atypical organisms, or asymmetric pulmonary edema. 2. Cardiomegaly and hiatal hernia. Electronically Signed   By: Keith Rake M.D.   On: 07/19/2021 21:52        Scheduled Meds:  enoxaparin (LOVENOX) injection  30 mg Subcutaneous Q24H   levothyroxine  75 mcg Oral Daily   metoprolol succinate  25 mg Oral Daily   verapamil  120 mg Oral Daily    Continuous Infusions:  azithromycin Stopped (07/20/21 0117)   ceFEPime (MAXIPIME) IV     lactated ringers 150 mL/hr at 07/20/21 0744   [START ON 07/21/2021] vancomycin       LOS: 1 day    Time spent: 87 min    Desma Maxim, MD [facility]   If 7PM-7AM, please contact night-coverage [email or web address] Password [facility] 07/20/2021, 8:28 AM

## 2021-07-20 NOTE — Assessment & Plan Note (Signed)
As above ?Continue Foley for now ?

## 2021-07-20 NOTE — ED Notes (Signed)
Called lab to add AM troponin to previous AM collection. ?

## 2021-07-20 NOTE — ED Notes (Signed)
Dr. Adelene Idler updated of SBP 170-190 after giving hydralazine.  ?

## 2021-07-21 ENCOUNTER — Inpatient Hospital Stay (HOSPITAL_COMMUNITY): Payer: Medicare Other

## 2021-07-21 ENCOUNTER — Inpatient Hospital Stay: Payer: Medicare Other

## 2021-07-21 ENCOUNTER — Inpatient Hospital Stay (HOSPITAL_COMMUNITY)
Admission: AD | Admit: 2021-07-21 | Discharge: 2021-07-26 | DRG: 064 | Disposition: A | Discharge status: Rehabilitation Facility | Payer: Medicare Other | Source: Other Acute Inpatient Hospital | Attending: Internal Medicine | Admitting: Internal Medicine

## 2021-07-21 ENCOUNTER — Encounter: Payer: Self-pay | Admitting: Internal Medicine

## 2021-07-21 ENCOUNTER — Inpatient Hospital Stay
Admission: EM | Admit: 2021-07-21 | Payer: Medicare Other | Source: Other Acute Inpatient Hospital | Admitting: Student in an Organized Health Care Education/Training Program

## 2021-07-21 DIAGNOSIS — E039 Hypothyroidism, unspecified: Secondary | ICD-10-CM | POA: Diagnosis present

## 2021-07-21 DIAGNOSIS — G9340 Encephalopathy, unspecified: Principal | ICD-10-CM

## 2021-07-21 DIAGNOSIS — I618 Other nontraumatic intracerebral hemorrhage: Secondary | ICD-10-CM | POA: Diagnosis present

## 2021-07-21 DIAGNOSIS — D62 Acute posthemorrhagic anemia: Secondary | ICD-10-CM | POA: Diagnosis not present

## 2021-07-21 DIAGNOSIS — I671 Cerebral aneurysm, nonruptured: Secondary | ICD-10-CM | POA: Diagnosis present

## 2021-07-21 DIAGNOSIS — E872 Acidosis, unspecified: Secondary | ICD-10-CM | POA: Diagnosis not present

## 2021-07-21 DIAGNOSIS — J471 Bronchiectasis with (acute) exacerbation: Secondary | ICD-10-CM

## 2021-07-21 DIAGNOSIS — Z79890 Hormone replacement therapy (postmenopausal): Secondary | ICD-10-CM

## 2021-07-21 DIAGNOSIS — I611 Nontraumatic intracerebral hemorrhage in hemisphere, cortical: Secondary | ICD-10-CM | POA: Diagnosis not present

## 2021-07-21 DIAGNOSIS — Z79899 Other long term (current) drug therapy: Secondary | ICD-10-CM

## 2021-07-21 DIAGNOSIS — R339 Retention of urine, unspecified: Secondary | ICD-10-CM | POA: Diagnosis present

## 2021-07-21 DIAGNOSIS — I10 Essential (primary) hypertension: Secondary | ICD-10-CM | POA: Diagnosis not present

## 2021-07-21 DIAGNOSIS — H53469 Homonymous bilateral field defects, unspecified side: Secondary | ICD-10-CM | POA: Diagnosis present

## 2021-07-21 DIAGNOSIS — Z888 Allergy status to other drugs, medicaments and biological substances status: Secondary | ICD-10-CM | POA: Diagnosis not present

## 2021-07-21 DIAGNOSIS — G936 Cerebral edema: Secondary | ICD-10-CM | POA: Diagnosis present

## 2021-07-21 DIAGNOSIS — D638 Anemia in other chronic diseases classified elsewhere: Secondary | ICD-10-CM | POA: Diagnosis not present

## 2021-07-21 DIAGNOSIS — N184 Chronic kidney disease, stage 4 (severe): Secondary | ICD-10-CM | POA: Diagnosis present

## 2021-07-21 DIAGNOSIS — I951 Orthostatic hypotension: Secondary | ICD-10-CM | POA: Diagnosis not present

## 2021-07-21 DIAGNOSIS — J189 Pneumonia, unspecified organism: Secondary | ICD-10-CM

## 2021-07-21 DIAGNOSIS — I615 Nontraumatic intracerebral hemorrhage, intraventricular: Secondary | ICD-10-CM | POA: Diagnosis present

## 2021-07-21 DIAGNOSIS — N179 Acute kidney failure, unspecified: Secondary | ICD-10-CM | POA: Diagnosis present

## 2021-07-21 DIAGNOSIS — D631 Anemia in chronic kidney disease: Secondary | ICD-10-CM | POA: Diagnosis present

## 2021-07-21 DIAGNOSIS — E871 Hypo-osmolality and hyponatremia: Secondary | ICD-10-CM | POA: Diagnosis present

## 2021-07-21 DIAGNOSIS — E440 Moderate protein-calorie malnutrition: Secondary | ICD-10-CM | POA: Diagnosis present

## 2021-07-21 DIAGNOSIS — G935 Compression of brain: Secondary | ICD-10-CM | POA: Diagnosis present

## 2021-07-21 DIAGNOSIS — K5901 Slow transit constipation: Secondary | ICD-10-CM | POA: Diagnosis present

## 2021-07-21 DIAGNOSIS — N1330 Unspecified hydronephrosis: Secondary | ICD-10-CM | POA: Diagnosis present

## 2021-07-21 DIAGNOSIS — R519 Headache, unspecified: Secondary | ICD-10-CM | POA: Diagnosis present

## 2021-07-21 DIAGNOSIS — J479 Bronchiectasis, uncomplicated: Secondary | ICD-10-CM | POA: Diagnosis present

## 2021-07-21 DIAGNOSIS — R338 Other retention of urine: Secondary | ICD-10-CM | POA: Diagnosis not present

## 2021-07-21 DIAGNOSIS — J470 Bronchiectasis with acute lower respiratory infection: Secondary | ICD-10-CM | POA: Diagnosis not present

## 2021-07-21 DIAGNOSIS — I472 Ventricular tachycardia, unspecified: Secondary | ICD-10-CM | POA: Diagnosis not present

## 2021-07-21 DIAGNOSIS — Z91041 Radiographic dye allergy status: Secondary | ICD-10-CM | POA: Diagnosis not present

## 2021-07-21 DIAGNOSIS — Z7951 Long term (current) use of inhaled steroids: Secondary | ICD-10-CM

## 2021-07-21 DIAGNOSIS — I619 Nontraumatic intracerebral hemorrhage, unspecified: Secondary | ICD-10-CM | POA: Diagnosis not present

## 2021-07-21 DIAGNOSIS — I609 Nontraumatic subarachnoid hemorrhage, unspecified: Secondary | ICD-10-CM | POA: Diagnosis present

## 2021-07-21 DIAGNOSIS — I69054 Hemiplegia and hemiparesis following nontraumatic subarachnoid hemorrhage affecting left non-dominant side: Secondary | ICD-10-CM | POA: Diagnosis present

## 2021-07-21 DIAGNOSIS — I129 Hypertensive chronic kidney disease with stage 1 through stage 4 chronic kidney disease, or unspecified chronic kidney disease: Secondary | ICD-10-CM | POA: Diagnosis present

## 2021-07-21 DIAGNOSIS — N170 Acute kidney failure with tubular necrosis: Secondary | ICD-10-CM

## 2021-07-21 DIAGNOSIS — Z681 Body mass index (BMI) 19 or less, adult: Secondary | ICD-10-CM

## 2021-07-21 DIAGNOSIS — G9349 Other encephalopathy: Secondary | ICD-10-CM | POA: Diagnosis present

## 2021-07-21 DIAGNOSIS — R4182 Altered mental status, unspecified: Secondary | ICD-10-CM | POA: Diagnosis present

## 2021-07-21 DIAGNOSIS — I6389 Other cerebral infarction: Secondary | ICD-10-CM | POA: Diagnosis not present

## 2021-07-21 DIAGNOSIS — R29718 NIHSS score 18: Secondary | ICD-10-CM | POA: Diagnosis present

## 2021-07-21 DIAGNOSIS — I160 Hypertensive urgency: Secondary | ICD-10-CM | POA: Diagnosis present

## 2021-07-21 LAB — RESPIRATORY PANEL BY PCR

## 2021-07-21 LAB — CBC
HCT: 23.1 % — ABNORMAL LOW (ref 36.0–46.0)
Hemoglobin: 7.6 g/dL — ABNORMAL LOW (ref 12.0–15.0)
MCH: 30.3 pg (ref 26.0–34.0)
MCHC: 32.9 g/dL (ref 30.0–36.0)
MCV: 92 fL (ref 80.0–100.0)
Platelets: 238 10*3/uL (ref 150–400)
RBC: 2.51 MIL/uL — ABNORMAL LOW (ref 3.87–5.11)
RDW: 13.3 % (ref 11.5–15.5)
WBC: 11 10*3/uL — ABNORMAL HIGH (ref 4.0–10.5)
nRBC: 0 % (ref 0.0–0.2)

## 2021-07-21 LAB — PROCALCITONIN: Procalcitonin: 0.51 ng/mL

## 2021-07-21 LAB — COMPREHENSIVE METABOLIC PANEL
ALT: 12 U/L (ref 0–44)
AST: 20 U/L (ref 15–41)
Albumin: 2.4 g/dL — ABNORMAL LOW (ref 3.5–5.0)
Alkaline Phosphatase: 53 U/L (ref 38–126)
Anion gap: 12 (ref 5–15)
BUN: 47 mg/dL — ABNORMAL HIGH (ref 8–23)
CO2: 16 mmol/L — ABNORMAL LOW (ref 22–32)
Calcium: 8.1 mg/dL — ABNORMAL LOW (ref 8.9–10.3)
Chloride: 105 mmol/L (ref 98–111)
Creatinine, Ser: 2.39 mg/dL — ABNORMAL HIGH (ref 0.44–1.00)
GFR, Estimated: 20 mL/min — ABNORMAL LOW (ref >60)
Glucose, Bld: 90 mg/dL (ref 70–99)
Potassium: 4.1 mmol/L (ref 3.5–5.1)
Sodium: 133 mmol/L — ABNORMAL LOW (ref 135–145)
Total Bilirubin: 0.6 mg/dL (ref 0.3–1.2)
Total Protein: 6 g/dL — ABNORMAL LOW (ref 6.5–8.1)

## 2021-07-21 LAB — URINE CULTURE: Culture: NO GROWTH

## 2021-07-21 LAB — MRSA NEXT GEN BY PCR, NASAL: MRSA by PCR Next Gen: NOT DETECTED

## 2021-07-21 LAB — SODIUM: Sodium: 130 mmol/L — ABNORMAL LOW (ref 135–145)

## 2021-07-21 LAB — C-REACTIVE PROTEIN: CRP: 9.2 mg/dL — ABNORMAL HIGH (ref <1.0)

## 2021-07-21 IMAGING — CT CT HEAD W/O CM
4 series · 16 of 47 positions shown, 18 images · non-contrast
Comparison: None

CLINICAL DATA: Tension-type headache



[Series 4: head bone · axial · 0.46mm/px · z∈[+153,+187]mm · 3 of 86 slices shown]
[im 9/86  bone]
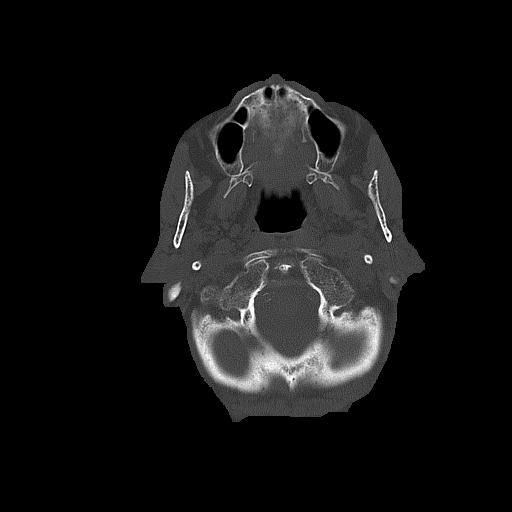
[im 18/86  bone]
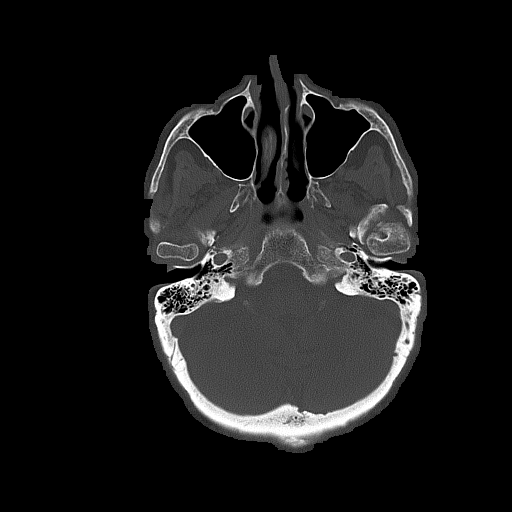
[im 26/86  bone]
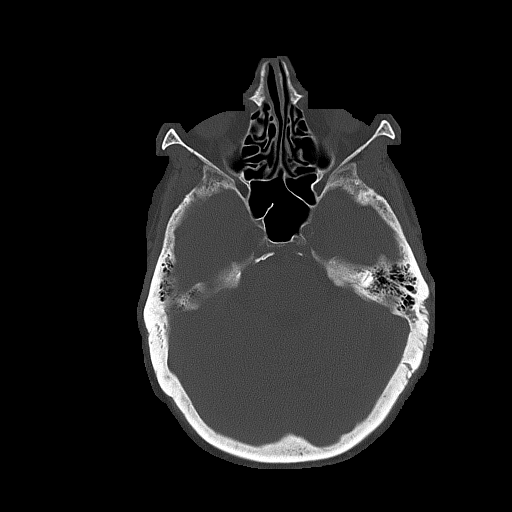

[Series 5: head wo · axial · 0.46mm/px · z∈[+157,+282]mm · 7 of 35 slices shown, 9 images]
[im 5/35  brain]
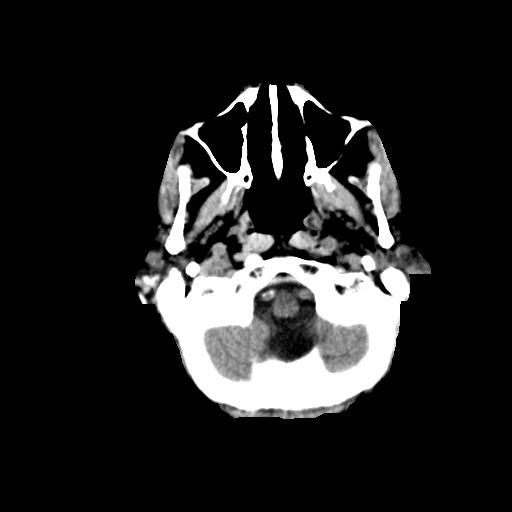
[im 5/35  bone]
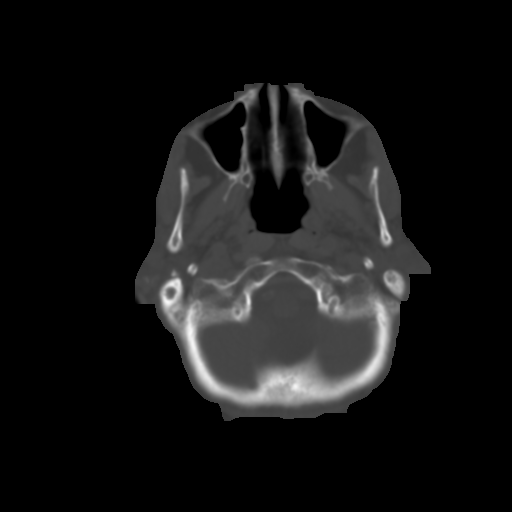
[im 9/35  brain]
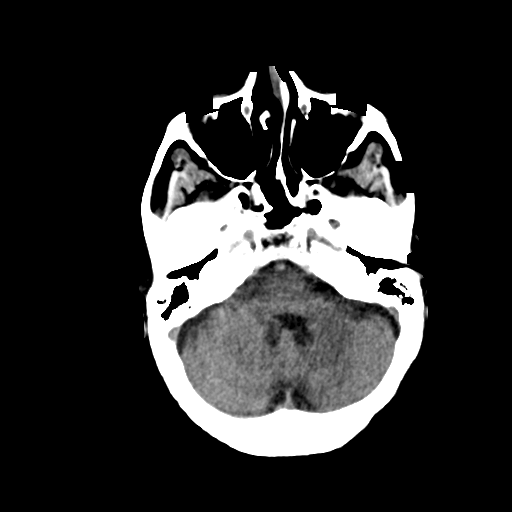
[im 13/35  brain]
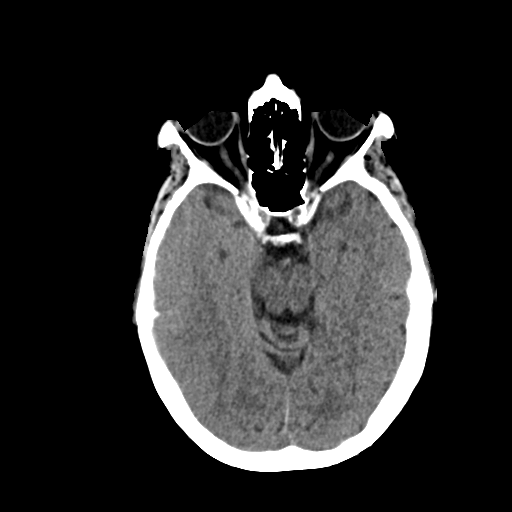
[im 18/35  brain]
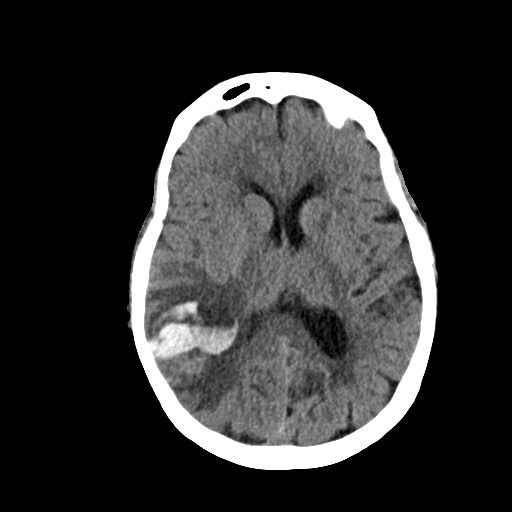
[im 22/35  brain]
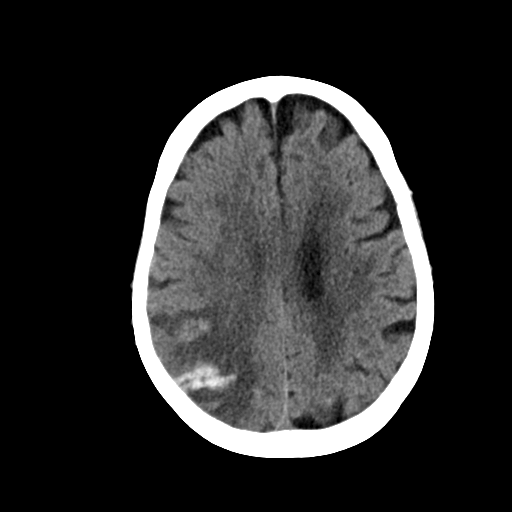
[im 22/35  bone]
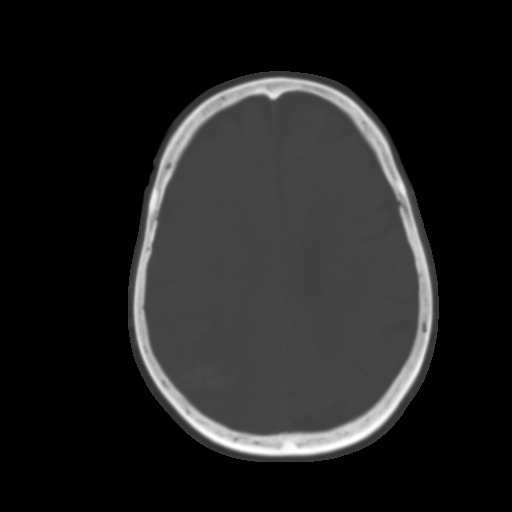
[im 26/35  brain]
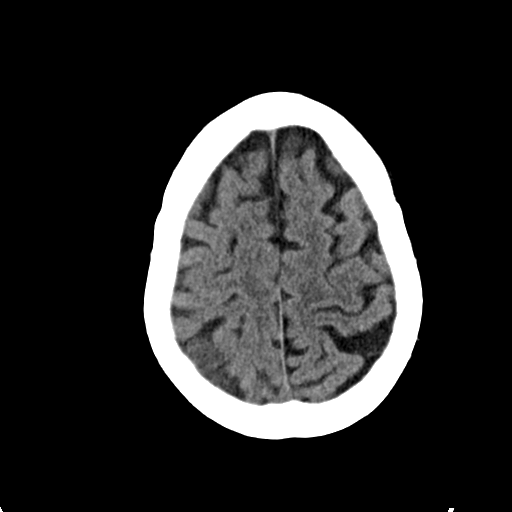
[im 30/35  brain]
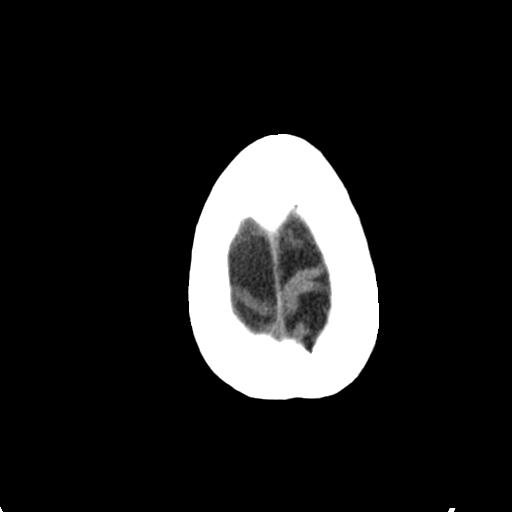

[Series 6: coronal soft tissue · coronal · 0.32mm/px · 3 of 66 slices shown]
[im 22/66  brain]
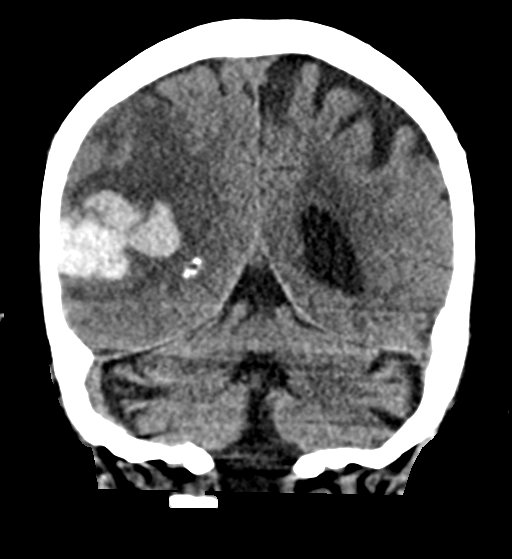
[im 29/66  brain]
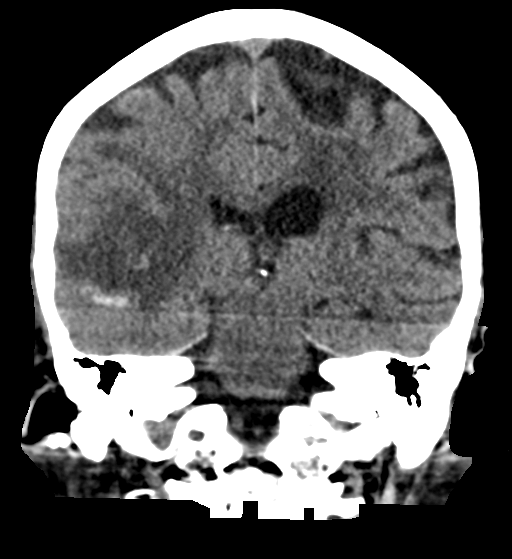
[im 37/66  brain]
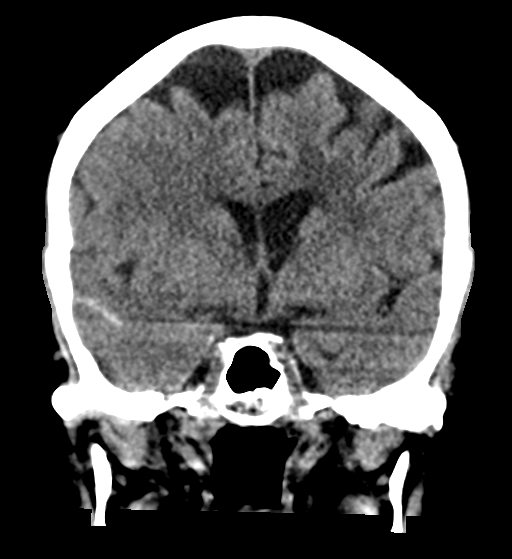

[Series 7: sagittal soft tissue · sagittal · 0.35mm/px · 3 of 52 slices shown]
[im 18/52  brain]
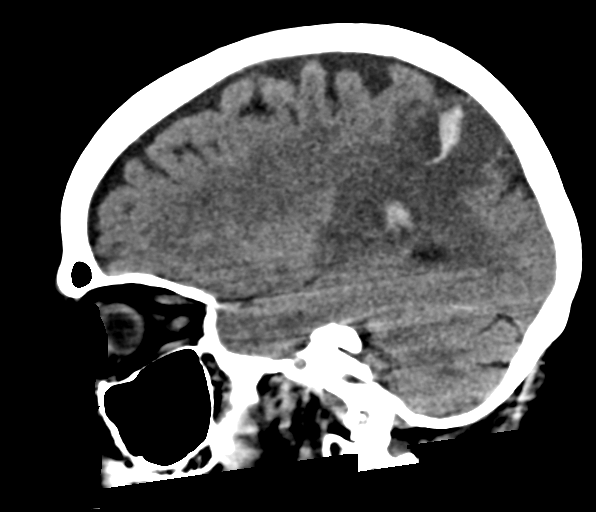
[im 26/52  brain]
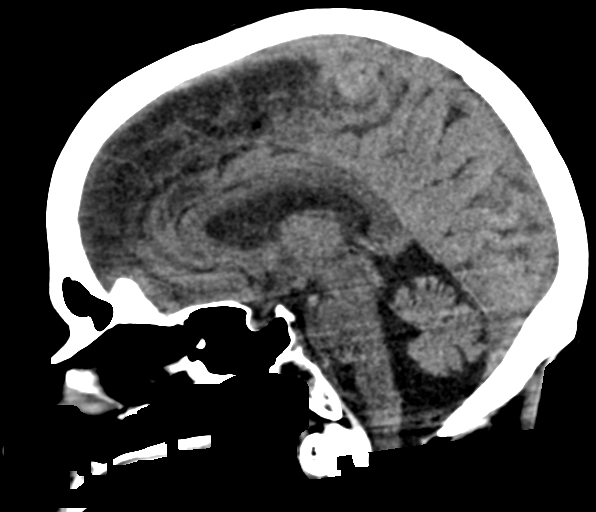
[im 35/52  brain]
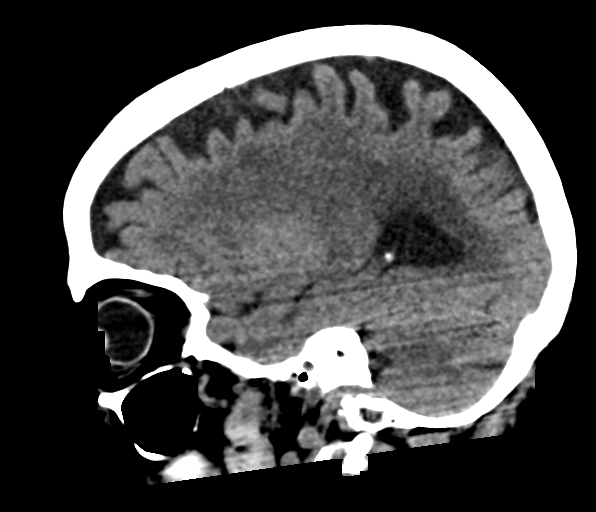

[16 of 47 positions shown; findings below may reference images not displayed]

FINDINGS: Brain: Generalized atrophy. Mild effacement of the occipital horn
and atrium of the RIGHT lateral ventricle. Remaining ventricular
system normal appearance. Large area of acute intraparenchymal
hemorrhage identified within the RIGHT temporoparietal lobe 4.0 x
4.0 x 4.1 cm with surrounding vasogenic edema. Additionally, small
amount of subarachnoid hemorrhage is seen within sulci at the
anterior RIGHT temporal lobe. No intraventricular extension of
hemorrhage. 3 mm of RIGHT to LEFT midline shift. Underlying small
vessel chronic ischemic changes of deep cerebral white matter. No
focal mass identified.

Vascular: Atherosclerotic calcification of internal carotid arteries
at skull base

Skull: Intact

Sinuses/Orbits: Clear

Other: N/A
IMPRESSION: Large area of acute intraparenchymal hemorrhage within the RIGHT
temporoparietal lobe 4.0 x 4.0 x 4.1 cm with surrounding vasogenic
edema and mild effacement of the occipital horn and atrium of the
RIGHT lateral ventricle.

Small amount of subarachnoid hemorrhage within sulci at the anterior
RIGHT temporal lobe.

3 mm of RIGHT to LEFT midline shift.

Underlying small vessel chronic ischemic changes of deep cerebral
white matter.

Critical Value/emergent results were called by telephone at the time
who verbally acknowledged these results.

## 2021-07-21 IMAGING — MR MR HEAD W/O CM
13 of 15 series · 35 of 48 positions shown · non-contrast
Comparison: Prior CT from earlier the same day.

CLINICAL DATA: Initial evaluation for altered mental status.

EXAM:
MRI HEAD WITHOUT CONTRAST
MRA HEAD WITHOUT CONTRAST
TECHNIQUE: Multiplanar, multi-echo pulse sequences of the brain and surrounding
structures were acquired without intravenous contrast. Angiographic
images of the Circle of Willis were acquired using MRA technique
without intravenous contrast.

[Series 5: DWI · axial · 3.0mm · 0.88mm/px · z∈[-99,+46]mm · 7 of 100 slices shown (1 of 4)]
[im 1/100]
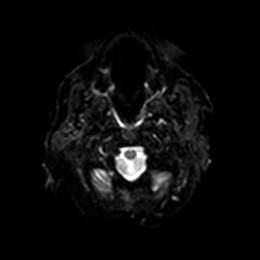
[im 17/100]
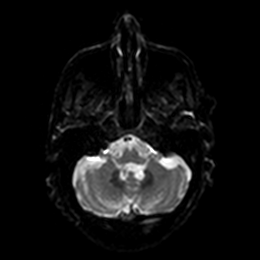
[im 34/100]
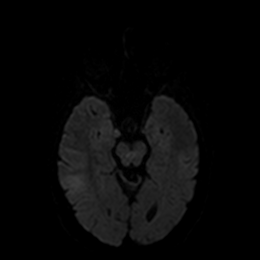
[im 50/100]
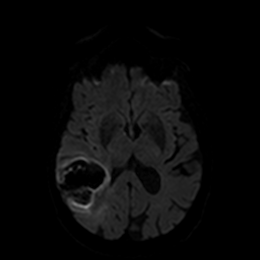
[im 67/100]
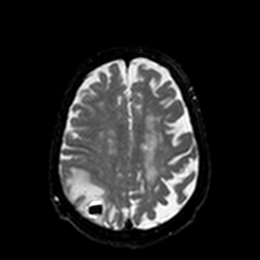
[im 83/100]
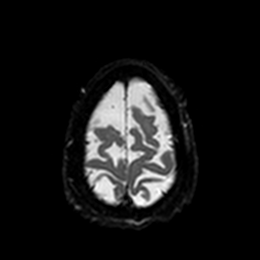
[im 100/100]
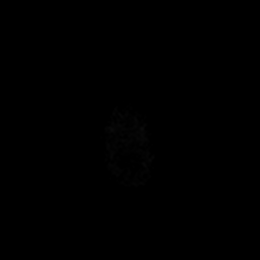

[Series 6: DWI · axial · 3.0mm · 0.88mm/px · z∈[-99,+46]mm · 3 of 50 slices shown (2 of 4)]
[im 1/50]
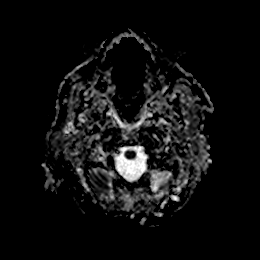
[im 25/50]
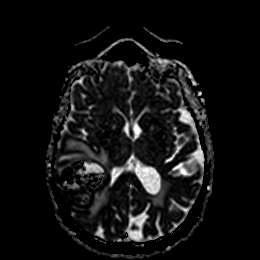
[im 50/50]
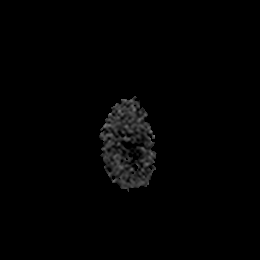

[Series 7: DWI · coronal · 4.0mm · 0.88mm/px · 4 of 72 slices shown (3 of 4)]
[im 1/72]
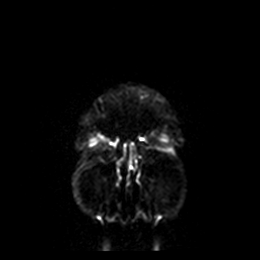
[im 24/72]
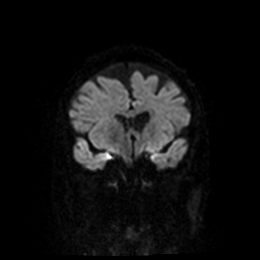
[im 48/72]
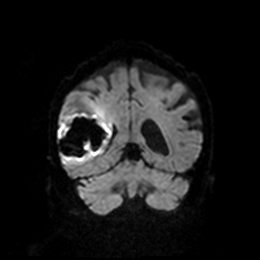
[im 72/72]
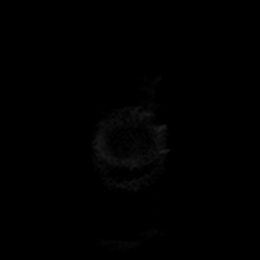

[Series 8: DWI · coronal · 4.0mm · 0.88mm/px · 2 of 36 slices shown (4 of 4)]
[im 1/36]
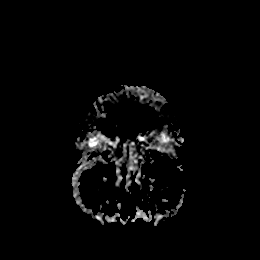
[im 36/36]
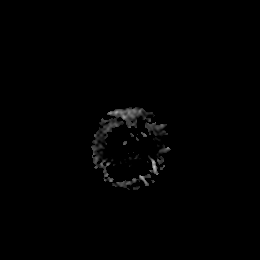

[Series 9: mag_images · axial · 3.0mm · 0.90mm/px · z∈[-120,+56]mm · 3 of 60 slices shown]
[im 1/60]
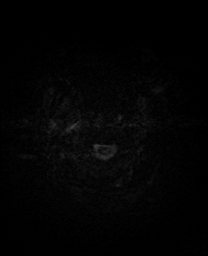
[im 30/60]
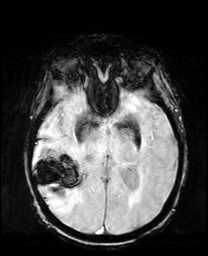
[im 60/60]
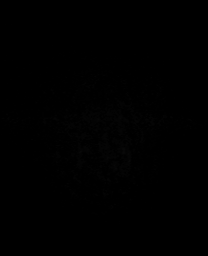

[Series 10: pha_images · axial · 3.0mm · 0.90mm/px · z∈[-117,+50]mm · 3 of 55 slices shown]
[im 1/55]
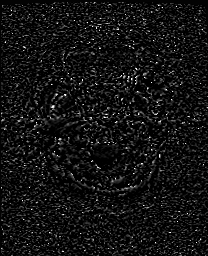
[im 28/55]
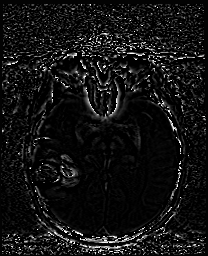
[im 55/55]
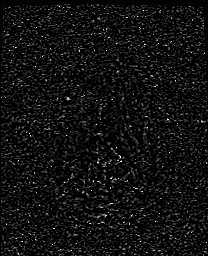

[Series 11: swi_images · axial · 3.0mm · 0.90mm/px · z∈[-120,+56]mm · 3 of 60 slices shown]
[im 1/60]
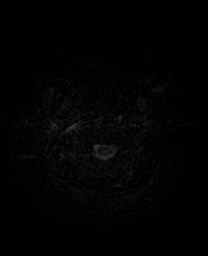
[im 30/60]
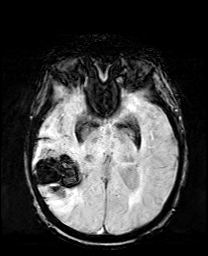
[im 60/60]
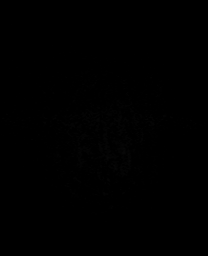

[Series 12: mip_images(sw) · axial · 24.0mm · 0.90mm/px · z∈[-109,+46]mm · 3 of 53 slices shown]
[im 1/53]
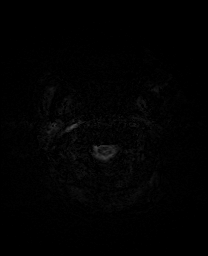
[im 27/53]
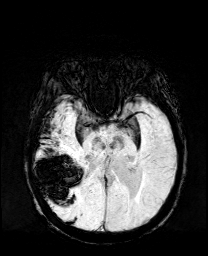
[im 53/53]
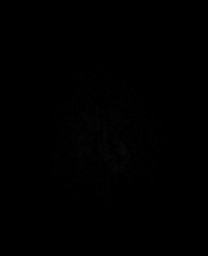

[Series 13: FLAIR · axial · 5.0mm · 0.45mm/px · 1 of 27 slices shown (1 of 2)]
[im 1/27]
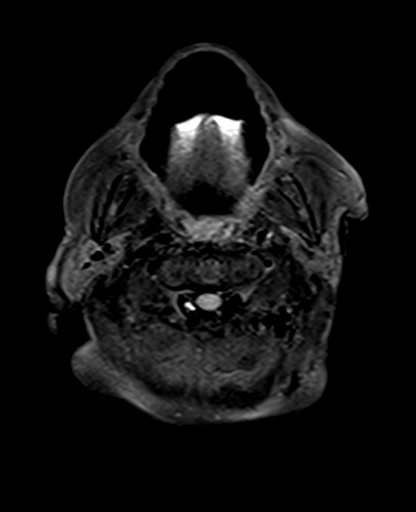

[Series 14: T1 · sagittal · 5.0mm · 0.75mm/px · 1 of 25 slices shown]
[im 1/25]
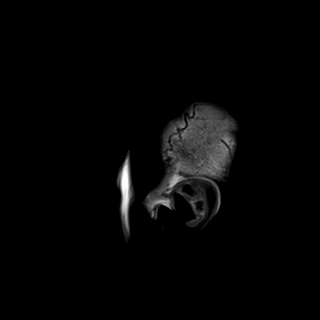

[Series 23: T2 · axial · 5.0mm · 0.72mm/px · 1 of 27 slices shown (1 of 2)]
[im 1/27]
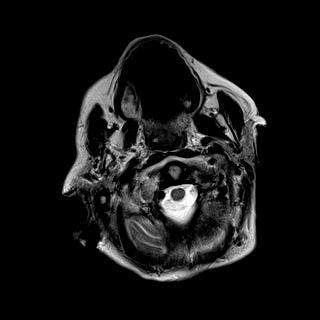

[Series 25: T2 · coronal · 5.0mm · 0.72mm/px · 2 of 30 slices shown (2 of 2)]
[im 1/30]
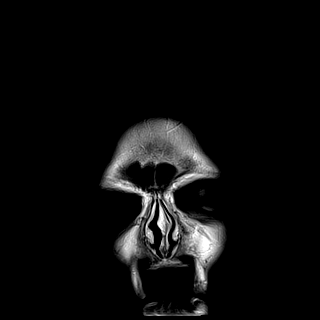
[im 30/30]
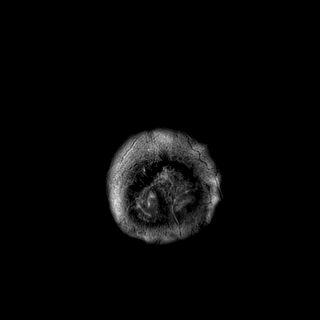

[Series 26: FLAIR · axial · 5.0mm · 0.90mm/px · z∈[-128,+43]mm · 2 of 30 slices shown (2 of 2)]
[im 1/30]
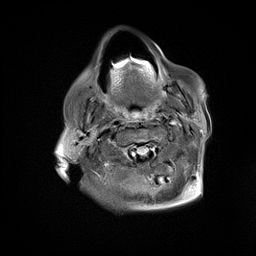
[im 30/30]
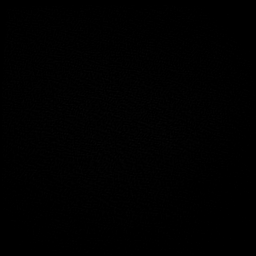

[35 of 48 positions shown; findings below may reference images not displayed]

FINDINGS: MRI HEAD FINDINGS

Brain: Generalized age-related cerebral atrophy. Patchy and
confluent T2/FLAIR hyperintensity involving the periventricular deep
white matter both cerebral hemispheres as well as the pons,
consistent with chronic small vessel ischemic disease, moderately
advanced in nature. Small remote left cerebellar infarct noted.

Previously identified intraparenchymal hematoma centered at the left
temporal occipital region again seen. Hemorrhage measures
approximately 3.7 x 3.7 x 3.7 cm (estimated volume 25 mL). No
visible underlying mass lesion or other structural abnormality on
this noncontrast examination. Associated small volume subarachnoid
hemorrhage within the adjacent right temporal region. Surrounding
vasogenic edema with partial mass effect on the atrium of the right
lateral ventricle and trace 3 mm right-to-left shift. No
hydrocephalus or trapping. Basilar cisterns remain patent. Trace
intraventricular hemorrhage noted as well, likely related to
redistribution.

No other evidence for acute intracranial hemorrhage. No acute or
subacute infarct elsewhere within the brain. Gray-white matter
differentiation otherwise maintained. No other foci of
susceptibility artifact to suggest acute or chronic intracranial
blood products.

No mass lesion or extra-axial fluid collection. Pituitary gland
suprasellar region normal. Midline structures intact.

Vascular: Major intracranial vascular flow voids are well
maintained.

Skull and upper cervical spine: Craniocervical junction within
normal limits. Bone marrow signal intensity normal. No scalp soft
tissue abnormality.

Sinuses/Orbits: Globes orbital soft tissues within normal limits.
Mild mucosal thickening noted within the ethmoidal air cells.
Paranasal sinuses are otherwise clear. Small bilateral mastoid
effusions noted. Visualized nasopharynx unremarkable.

Other: None.

MRA HEAD FINDINGS

Anterior circulation: Examination degraded by motion artifact and
positioning.

Visualized distal cervical segments of the internal carotid arteries
are patent with antegrade flow. Petrous segments patent bilaterally.
Atheromatous irregularity seen within the carotid siphons without
hemodynamically significant stenosis. Irregular 5 mm focal
outpouching extending laterally from the cavernous right ICA
consistent with a small aneurysm (series 19, image 57). A1 segments
patent bilaterally. Normal anterior communicating artery complex.
Both anterior cerebral arteries grossly patent to their distal
aspects without significant stenosis. No M1 stenosis or occlusion.
Normal MCA bifurcations. Distal MCA branches well perfused and
fairly symmetric.

Posterior circulation: Visualized distal V4 segments patent without
stenosis. Right vertebral artery slightly dominant. Basilar patent
to its distal aspect without stenosis. Superior cerebellar arteries
patent bilaterally. Both PCAs primarily supplied via the basilar
well perfused or distal aspects.

Anatomic variants: None significant. No vascular abnormality seen
underlying the right cerebral hemorrhage.
IMPRESSION: MRI HEAD IMPRESSION:

1. 25 mL intraparenchymal hematoma centered at the right
temporoccipital region. No visible underlying mass lesion or other
structural abnormality on this noncontrast examination. Associated
vasogenic edema with trace 3 mm of right-to-left shift.
2. Associated small volume subarachnoid hemorrhage within the
adjacent right cerebral hemisphere. Trace intraventricular
hemorrhage likely related to redistribution. No hydrocephalus or
trapping.
3. Underlying age-related cerebral atrophy with moderately advanced
chronic microvascular ischemic disease.
4. Small remote left cerebellar infarct.

MRA HEAD IMPRESSION:

1. No vascular abnormality seen underlying the right cerebral
hemorrhage. Negative intracranial MRA
2. For large vessel occlusion. Mild atheromatous irregularity about
the carotid siphons without hemodynamically significant or
correctable stenosis.
3. 5 mm aneurysm arising from the cavernous right ICA as above.

## 2021-07-21 MED ORDER — OLANZAPINE 10 MG PO TBDP
10.0000 mg | ORAL_TABLET | Freq: Every day | ORAL | Status: DC
  Filled 2021-07-21: qty 1

## 2021-07-21 MED ORDER — FENTANYL CITRATE PF 50 MCG/ML IJ SOSY
50.0000 ug | PREFILLED_SYRINGE | Freq: Once | INTRAMUSCULAR | Status: AC
  Administered 2021-07-21: 50 ug via INTRAVENOUS
  Filled 2021-07-21 (×2): qty 1

## 2021-07-21 MED ORDER — CLEVIDIPINE BUTYRATE 0.5 MG/ML IV EMUL: 0.0000 mg/h | INTRAVENOUS | Status: DC

## 2021-07-21 MED ORDER — NICARDIPINE HCL IN NACL 20-0.86 MG/200ML-% IV SOLN: 3.0000 mg/h | INTRAVENOUS | Status: DC

## 2021-07-21 MED ORDER — CHLORHEXIDINE GLUCONATE CLOTH 2 % EX PADS
6.0000 | MEDICATED_PAD | Freq: Every day | CUTANEOUS | Status: DC
  Administered 2021-07-21: 6 via TOPICAL

## 2021-07-21 MED ORDER — MORPHINE SULFATE (PF) 2 MG/ML IV SOLN
1.0000 mg | INTRAVENOUS | Status: AC | PRN
  Administered 2021-07-21 (×2): 1 mg via INTRAVENOUS
  Filled 2021-07-21 (×2): qty 1

## 2021-07-21 MED ORDER — ACETAMINOPHEN 650 MG RE SUPP: 650.0000 mg | RECTAL | Status: DC | PRN

## 2021-07-21 MED ORDER — PANTOPRAZOLE SODIUM 40 MG IV SOLR
40.0000 mg | Freq: Every day | INTRAVENOUS | Status: DC
  Administered 2021-07-21: 40 mg via INTRAVENOUS
  Filled 2021-07-21: qty 10

## 2021-07-21 MED ORDER — ACETAMINOPHEN 325 MG PO TABS
650.0000 mg | ORAL_TABLET | ORAL | Status: DC | PRN
  Administered 2021-07-24 – 2021-07-26 (×4): 650 mg via ORAL
  Filled 2021-07-21 (×4): qty 2

## 2021-07-21 MED ORDER — ADULT MULTIVITAMIN W/MINERALS CH: 1.0000 | ORAL_TABLET | Freq: Every day | ORAL | Status: DC

## 2021-07-21 MED ORDER — SENNOSIDES-DOCUSATE SODIUM 8.6-50 MG PO TABS
1.0000 | ORAL_TABLET | Freq: Two times a day (BID) | ORAL | Status: DC
  Administered 2021-07-22 – 2021-07-26 (×8): 1 via ORAL
  Filled 2021-07-21 (×9): qty 1

## 2021-07-21 MED ORDER — ENSURE ENLIVE PO LIQD: 237.0000 mL | Freq: Three times a day (TID) | ORAL | Status: DC

## 2021-07-21 MED ORDER — THIAMINE HCL 100 MG/ML IJ SOLN
500.0000 mg | Freq: Three times a day (TID) | INTRAVENOUS | Status: DC
  Filled 2021-07-21 (×3): qty 5

## 2021-07-21 MED ORDER — DEXTROSE 5 % IV SOLN
INTRAVENOUS | Status: DC
Start: 2021-07-21 — End: 2021-07-21
  Filled 2021-07-21: qty 1000
  Filled 2021-07-21: qty 150

## 2021-07-21 MED ORDER — HEPARIN SODIUM (PORCINE) 5000 UNIT/ML IJ SOLN: 5000.0000 [IU] | Freq: Three times a day (TID) | INTRAMUSCULAR | Status: DC

## 2021-07-21 MED ORDER — NICARDIPINE HCL IN NACL 20-0.86 MG/200ML-% IV SOLN
3.0000 mg/h | INTRAVENOUS | Status: DC
  Filled 2021-07-21: qty 200

## 2021-07-21 MED ORDER — SODIUM CHLORIDE 0.9 % IV SOLN: INTRAVENOUS | Status: DC

## 2021-07-21 MED ORDER — LEVOFLOXACIN IN D5W 500 MG/100ML IV SOLN: 500.0000 mg | INTRAVENOUS | Status: DC

## 2021-07-21 MED ORDER — ACETAMINOPHEN 160 MG/5ML PO SOLN: 650.0000 mg | ORAL | Status: DC | PRN

## 2021-07-21 MED ORDER — LEVOFLOXACIN IN D5W 500 MG/100ML IV SOLN
500.0000 mg | INTRAVENOUS | Status: DC
  Administered 2021-07-21: 500 mg via INTRAVENOUS
  Filled 2021-07-21: qty 100

## 2021-07-21 MED ORDER — SODIUM CHLORIDE 3 % IV SOLN: INTRAVENOUS | Status: DC

## 2021-07-21 MED ORDER — VERAPAMIL HCL 120 MG PO TABS
60.0000 mg | ORAL_TABLET | Freq: Two times a day (BID) | ORAL | Status: DC
  Administered 2021-07-21: 60 mg via ORAL
  Filled 2021-07-21 (×2): qty 0.5

## 2021-07-21 MED ORDER — NICARDIPINE HCL IN NACL 20-0.86 MG/200ML-% IV SOLN
3.0000 mg/h | INTRAVENOUS | Status: DC
  Administered 2021-07-21: 5 mg/h via INTRAVENOUS
  Filled 2021-07-21: qty 200

## 2021-07-21 MED ORDER — OLANZAPINE 5 MG PO TBDP
5.0000 mg | ORAL_TABLET | Freq: Every day | ORAL | Status: DC
  Administered 2021-07-21: 5 mg via ORAL
  Filled 2021-07-21: qty 1

## 2021-07-21 MED ORDER — STROKE: EARLY STAGES OF RECOVERY BOOK
Freq: Once | Status: AC
  Filled 2021-07-21: qty 1

## 2021-07-21 MED ORDER — ONDANSETRON HCL 4 MG/2ML IJ SOLN
4.0000 mg | Freq: Three times a day (TID) | INTRAMUSCULAR | Status: DC | PRN
  Administered 2021-07-21: 4 mg via INTRAVENOUS
  Filled 2021-07-21: qty 2

## 2021-07-21 NOTE — Progress Notes (Addendum)
Neurology Progress Note ? ? ?S:// ?Patient seen and examined in the neurological ICU ?Seen earlier in the day by my partner Dr. Cheral Marker at [facility]. 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 ?Family provides history of a fall. ?Unclear etiology-traumatic ICH versus primary ICH versus bleeding from an underlying vascular abnormality. ? ? ?O:// ?Current vital signs: ?BP 133/75   Pulse 91   Resp (!) 28   SpO2 95%  ?Vital signs in last 24 hours: ?Temp:  [97.2 ?F (36.2 ?C)-98.6 ?F (37 ?C)] 97.5 ?F (36.4 ?C) (03/06 1307) ?Pulse Rate:  [72-108] 91 (03/06 1700) ?Resp:  [18-38] 28 (03/06 1700) ?BP: (121-192)/(69-91) 133/75 (03/06 1700) ?SpO2:  [94 %-99 %] 95 % (03/06 1700) ?General: Drowsy, awakens to voice, no acute distress ?HEENT: Normocephalic atraumatic ?CVS: Regular rate rhythm ?Abdomen nondistended nontender ?Respiratory: Breathing well saturating normally on room air ?Neurological exam ?Drowsy ?Opens eyes to voice ?Able to tell me her name ?Has dysarthria ?Difficulty naming. ?Poor attention concentration ?Left-sided neglect ?Cranial nerves: Pupils are equal round reactive to light, has rightward gaze preference and can cross midline somewhat but does not sustain gaze to the left.  Face looks grossly symmetric.  Left homonymous hemianopsia ?Motor examination: Left upper extremity is flaccid with hemineglect.  Can move some but not antigravity.  Left lower extremity barely antigravity movement.  Right upper and lower extremity are at least antigravity but due to attention concentration issues does not hold them up for 10 and 5 seconds. ?Sensory exam: Minimal withdrawal to noxious stimulation on the left, strong withdrawal to noxious simulation all right ?Coordination cannot be assessed due to mentation ?1a Level of Conscious.: 1 ?1b LOC Questions: 2 ?1c LOC Commands: 0 ?2 Best Gaze: 1 ?3 Visual: 2 ?4 Facial Palsy:  0 ?5a Motor Arm - left: 4 ?5b Motor Arm - Right: 1 ?6a Motor Leg - Left: 2 ?6b Motor Leg - Right: 1 ?7 Limb Ataxia: 0 ?8 Sensory: 1 ?9 Best Language: 0 ?10 Dysarthria: 1 ?11 Extinct. and Inatten.: 2 ?TOTAL: 18 ? ? ?Medications ? ?Current Facility-Administered Medications:  ?   stroke: mapping our early stages of recovery book, , Does not apply, Once, Jacky Kindle, MD ?  acetaminophen (TYLENOL) tablet 650 mg, 650 mg, Oral, Q4H PRN **OR** acetaminophen (TYLENOL) 160 MG/5ML solution 650 mg, 650 mg, Per Tube, Q4H PRN **OR** acetaminophen (TYLENOL) suppository 650 mg, 650 mg, Rectal, Q4H PRN, Tacy Learn, [place], MD ?  clevidipine (CLEVIPREX) infusion 0.5 mg/mL, 0-21 mg/hr, Intravenous, Continuous, Chand, [place]ham, MD ?  levofloxacin (LEVAQUIN) IVPB 500 mg, 500 mg, Intravenous, Q48H, Chand, Sudham, MD ?  senna-docusate (Senokot-S) tablet 1 tablet, 1 tablet, Oral, BID, Jacky Kindle, MD ?Labs ?CBC ?   ?Component Value Date/Time  ? WBC 11.0 (H) 07/21/2021 0414  ? RBC 2.51 (L) 07/21/2021 0414  ? HGB 7.6 (L) 07/21/2021 0414  ? HCT 23.1 (L) 07/21/2021 0414  ? PLT 238 07/21/2021 0414  ? MCV 92.0 07/21/2021 0414  ? MCH 30.3 07/21/2021 0414  ? MCHC 32.9 07/21/2021 0414  ? RDW 13.3 07/21/2021 0414  ? ? ?CMP  ?   ?Component Value Date/Time  ? NA 133 (L) 07/21/2021 0414  ? K 4.1 07/21/2021 0414  ? CL 105 07/21/2021 0414  ? CO2 16 (L) 07/21/2021 0414  ? GLUCOSE 90 07/21/2021 0414  ? BUN 47 (H) 07/21/2021 0414  ? CREATININE 2.39 (H) 07/21/2021 0414  ? CALCIUM 8.1 (L) 07/21/2021 0414  ?  PROT 6.0 (L) 07/21/2021 0414  ? ALBUMIN 2.4 (L) 07/21/2021 0414  ? AST 20 07/21/2021 0414  ? ALT 12 07/21/2021 0414  ? ALKPHOS 53 07/21/2021 0414  ? BILITOT 0.6 07/21/2021 0414  ? GFRNONAA 20 (L) 07/21/2021 0414  ? ? ?Imaging ?I have reviewed images in epic and the results pertinent to this consultation are: CT scan of the head without contrast ?IMPRESSION: ?Large area of acute intraparenchymal hemorrhage within the RIGHT ?temporoparietal lobe 4.0 x 4.0 x 4.1  cm with surrounding vasogenic ?edema and mild effacement of the occipital horn and atrium of the ?RIGHT lateral ventricle. ?  ?Small amount of subarachnoid hemorrhage within sulci at the anterior ?RIGHT temporal lobe. ?  ?3 mm of RIGHT to LEFT midline shift. ?  ?Underlying small vessel chronic ischemic changes of deep cerebral ?white matter. ? ?Bleed volume about 32 cc.  GCS 13 ?ICH score-1 ? ?Assessment: 81 year old woman with subacute right parietal lobe ICH with associated edema mass effect and adjacent subarachnoid hemorrhage.  Etiology could be traumatic versus hypertensive versus underlying vascular malformation. ?Drowsy but still able to follow some commands. ? ?Recommendations: ?MRI brain without contrast-has renal impairment ?Appreciate ICU care ?Full set of recommendations as provided by Dr. Cheral Marker in the earlier note from today. ?I would agree with holding off on hypertonic saline for right now. ?Repeat head CT around 1 PM tomorrow. ?Blood pressure parameters SBP goal 1 30-1 50. ?DVT prophylaxis: No antiplatelets or anticoagulants.  Use SCDs only. ?Stroke team will follow with you. ?Plan discussed with Dr. Tacy Learn ?-- ?Amie Portland, MD ?Neurologist ?[facility] ?Pager: [phone] ? ?CRITICAL CARE ATTESTATION ?Performed by: Amie Portland, MD ?Total critical care time: 32 minutes ?Critical care time was exclusive of separately billable procedures and treating other patients and/or supervising APPs/Residents/Students ?Critical care was necessary to treat or prevent imminent or life-threatening deterioration due to [place]  ?This patient is critically ill and at significant risk for neurological worsening and/or death and care requires constant monitoring. ?Critical care was time spent personally by me on the following activities: development of treatment plan with patient and/or surrogate as well as nursing, discussions with consultants, evaluation of patient's response to treatment, examination of  patient, obtaining history from patient or surrogate, ordering and performing treatments and interventions, ordering and review of laboratory studies, ordering and review of radiographic studies, pulse oximetry, re-evaluation of patient's condition, participation in multidisciplinary rounds and medical decision making of high complexity in the care of this patient. ? ? ?

## 2021-07-21 NOTE — Progress Notes (Addendum)
PROGRESS NOTE    [name]  ZOX:[id number] DOB: 08/14/1940 DOA: 07/19/2021 PCP: [facility]  Outpatient Specialists: pulmonology, cardiology, nephrology    Brief Narrative:   From admission h and p Lauren Conner is a 81 y.o. female with medical history significant for CKD4, anemia of CKD, paroxysmal SVT, hypothyroidism, bronchiectasis, HTN, who lives alone and is functionally independent who was brought in for evaluation of weakness and confusion that was noted on the day of arrival.  History is limited due to altered mental status patient appeared to complain of right-sided flank pain   Assessment & Plan:   Principal Problem:   Multifocal pneumonia Active Problems:   Severe sepsis ([place])   Acute metabolic encephalopathy   Metabolic acidosis   Hyponatremia   Essential hypertension   Adult bronchiectasis ([place])   Hypothyroidism (acquired)   CKD (chronic kidney disease) stage 4, GFR 15-29 ml/min (HCC)   Paroxysmal SVT (supraventricular tachycardia) (HCC)   Anemia of chronic kidney failure, stage 4 (severe) ([place])   Acute urinary retention   Bilateral hydronephrosis  # Sepsis, severe By elevated lactate, fever, tachycardia. 2/2 uti, possible CAP. 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 Followed by pulm but last visit was 2021. Not on o2 at home. Here septic, tachypnic. Not hypoxic but will monitor closely. Flu/covid negative. CT findings appear mostly stable. Pulm following. Mrsa swab neg. Respiratory panel neg - s/p vanc/cefepime - levofloxacin started 3/5 - sputum for culture if able - f/u blood cultures, ngtd - f/u urine strep and legionella antigens  # Acute urinary retention # Acute cystitis? Son says has a history of urinary retention. CT showing severe hydro, distended bladder, no obstructing stone. Foley placed 3/4 in the ED with 1100 ml output.  - abx  as above - maintain Foley - f/u urine culture  # Acute metabolic encephalopathy # Headache Vs delirium. New headache today, no fever or neck stiffness - treat underlying cause - f/u CT head - SLP following - start olanzapime  - start high dose thiamine  # Hyponatremia Subacute on chronic. Baseline is around 130, here 126 on presentation likely 2/2 dehydration as has improved today to 133 with fluids.   - continue fluids and trend  # Metabolic acidosis 2/2 infection - switch to sodium bicarb  # Elevated troponin No EKG changes to suggest acs. Likely demand. 33>55 - continue to trend - maintain tele  # Anemia Hgb drop to 7.6 today, likely dilutional as no bleeding, no BM - monitor  # Hypothyroid - cont home levothyroxine  # HTN Here hypertensive - resume home metoprolol, verapamil  # Hx pSVT - resume home metop, verapamil  # CKD stage 4 Appears most recent creatinine is around 2 and here low 2s - monitor - fluids as above, may improve some    DVT prophylaxis: heparin Code Status: full Family Communication: son updated @ bedside 3/6  Level of care: Progressive Status is: Inpatient Remains inpatient appropriate because: severity of illness    Consultants:  pulm  Procedures: none  Antimicrobials:  Vanc/cefepime>levofloxacin   Subjective: Asleep. Awakes and calls for help  Objective: Vitals:   07/20/21 1752 07/21/21 0039 07/21/21 0450 07/21/21 0756  BP: (!) 163/87  (!) 149/88 (!) 151/72  Pulse: (!) 108 89 96 (!) 104  Resp: 20  20   Temp: 98.6 F (37 C)  98.1 F (36.7 C) 98.1  F (36.7 C)  TempSrc:   Axillary Oral  SpO2: 97% 94% 99% 98%  Weight:      Height:        Intake/Output Summary (Last 24 hours) at 07/21/2021 1108 Last data filed at 07/21/2021 0600 Gross per 24 hour  Intake 390 ml  Output 900 ml  Net -510 ml   Filed Weights   07/19/21 2312  Weight: 45.3 kg    Examination:  General exam: mildly agitated Respiratory system:  tachypnea resolved, scattered rhonchi Cardiovascular system: S1 & S2 heard, tachycardic. No JVD, murmurs, rubs, gallops or clicks. No pedal edema. Gastrointestinal system: Abdomen is nondistended, soft and nontender. No organomegaly or masses felt. Normal bowel sounds heard. Central nervous system: Opens eyes to voice, says head hurts. No neck stiffness. Moves all 4 ext Extremities: warm Skin: No rashes, lesions or ulcers Psychiatry: intermittently agitated   Data Reviewed: I have personally reviewed following labs and imaging studies  CBC: Recent Labs  Lab 07/19/21 2120 07/20/21 0745 07/21/21 0414  WBC 17.1* 13.6* 11.0*  HGB 10.2* 9.1* 7.6*  HCT 31.8* 27.5* 23.1*  MCV 95.5 93.5 92.0  PLT 343 273 831   Basic Metabolic Panel: Recent Labs  Lab 07/19/21 2200 07/20/21 0745 07/21/21 0414  NA 126* 129* 133*  K 4.7 4.5 4.1  CL 97* 102 105  CO2 17* 16* 16*  GLUCOSE 157* 116* 90  BUN 41* 39* 47*  CREATININE 2.13* 2.07* 2.39*  CALCIUM 8.9 8.3* 8.1*   GFR: Estimated Creatinine Clearance: 13.6 mL/min (A) (by C-G formula based on SCr of 2.39 mg/dL (H)). Liver Function Tests: Recent Labs  Lab 07/19/21 2200 07/21/21 0414  AST 35 20  ALT 15 12  ALKPHOS 68 53  BILITOT 0.7 0.6  PROT 7.9 6.0*  ALBUMIN 3.1* 2.4*   No results for input(s): LIPASE, AMYLASE in the last 168 hours. No results for input(s): AMMONIA in the last 168 hours. Coagulation Profile: Recent Labs  Lab 07/20/21 0016  INR 1.2   Cardiac Enzymes: No results for input(s): CKTOTAL, CKMB, CKMBINDEX, TROPONINI in the last 168 hours. BNP (last 3 results) No results for input(s): PROBNP in the last 8760 hours. HbA1C: No results for input(s): HGBA1C in the last 72 hours. CBG: No results for input(s): GLUCAP in the last 168 hours. Lipid Profile: No results for input(s): CHOL, HDL, LDLCALC, TRIG, CHOLHDL, LDLDIRECT in the last 72 hours. Thyroid Function Tests: No results for input(s): TSH, T4TOTAL, FREET4,  T3FREE, THYROIDAB in the last 72 hours. Anemia Panel: No results for input(s): VITAMINB12, FOLATE, FERRITIN, TIBC, IRON, RETICCTPCT in the last 72 hours. Urine analysis:    Component Value Date/Time   COLORURINE STRAW (A) 07/19/2021 2117   APPEARANCEUR CLEAR (A) 07/19/2021 2117   LABSPEC 1.012 07/19/2021 2117   PHURINE 6.0 07/19/2021 2117   GLUCOSEU NEGATIVE 07/19/2021 2117   HGBUR SMALL (A) 07/19/2021 2117   BILIRUBINUR NEGATIVE 07/19/2021 2117   KETONESUR NEGATIVE 07/19/2021 2117   PROTEINUR >=300 (A) 07/19/2021 2117   NITRITE NEGATIVE 07/19/2021 2117   LEUKOCYTESUR NEGATIVE 07/19/2021 2117   Sepsis Labs: _0 (procalcitonin:4,lacticidven:4)  ) Recent Results (from the past 240 hour(s))  Resp Panel by RT-PCR (Flu A&B, Covid) Nasopharyngeal Swab     Status: None   Collection Time: 07/19/21  9:17 PM   Specimen: Nasopharyngeal Swab; Nasopharyngeal(NP) swabs in vial transport medium  Result Value Ref Range Status   SARS Coronavirus 2 by RT PCR NEGATIVE NEGATIVE Final    Comment: (NOTE) SARS-CoV-2 target nucleic  acids are NOT DETECTED.  The SARS-CoV-2 RNA is generally detectable in upper respiratory specimens during the acute phase of infection. The lowest concentration of SARS-CoV-2 viral copies this assay can detect is 138 copies/mL. A negative result does not preclude SARS-Cov-2 infection and should not be used as the sole basis for treatment or other patient management decisions. A negative result may occur with  improper specimen collection/handling, submission of specimen other than nasopharyngeal swab, presence of viral mutation(s) within the areas targeted by this assay, and inadequate number of viral copies(<138 copies/mL). A negative result must be combined with clinical observations, patient history, and epidemiological information. The expected result is Negative.  Fact Sheet for Patients:  EntrepreneurPulse.com.au  Fact Sheet for  Healthcare Providers:  IncredibleEmployment.be  This test is no t yet approved or cleared by the [place] FDA and  has been authorized for detection and/or diagnosis of SARS-CoV-2 by FDA under an Emergency Use Authorization (EUA). This EUA will remain  in effect (meaning this test can be used) for the duration of the COVID-19 declaration under Section 564(b)(1) of the Act, 21 U.S.C.section 360bbb-3(b)(1), unless the authorization is terminated  or revoked sooner.       Influenza A by PCR NEGATIVE NEGATIVE Final   Influenza B by PCR NEGATIVE NEGATIVE Final    Comment: (NOTE) The Xpert Xpress SARS-CoV-2/FLU/RSV plus assay is intended as an aid in the diagnosis of influenza from Nasopharyngeal swab specimens and should not be used as a sole basis for treatment. Nasal washings and aspirates are unacceptable for Xpert Xpress SARS-CoV-2/FLU/RSV testing.  Fact Sheet for Patients: EntrepreneurPulse.com.au  Fact Sheet for Healthcare Providers: IncredibleEmployment.be  This test is not yet approved or cleared by the [place] FDA and has been authorized for detection and/or diagnosis of SARS-CoV-2 by FDA under an Emergency Use Authorization (EUA). This EUA will remain in effect (meaning this test can be used) for the duration of the COVID-19 declaration under Section 564(b)(1) of the Act, 21 U.S.C. section 360bbb-3(b)(1), unless the authorization is terminated or revoked.  Performed at [facility], [address] [place], [place] [address]   Culture, blood (Routine x 2)     Status: None (Preliminary result)   Collection Time: 07/19/21  9:23 PM   Specimen: BLOOD  Result Value Ref Range Status   Specimen Description BLOOD RIGHT ANTECUBITAL  Final   Special Requests   Final    BOTTLES DRAWN AEROBIC AND ANAEROBIC Blood Culture adequate volume   Culture   Final    NO GROWTH 2 DAYS Performed at [facility], [address], [place], [place] [address]    Report Status PENDING  Incomplete  Culture, blood (Routine x 2)     Status: None (Preliminary result)   Collection Time: 07/19/21  9:28 PM   Specimen: BLOOD  Result Value Ref Range Status   Specimen Description BLOOD BLOOD LEFT FOREARM  Final   Special Requests   Final    BOTTLES DRAWN AEROBIC AND ANAEROBIC Blood Culture results may not be optimal due to an excessive volume of blood received in culture bottles   Culture   Final    NO GROWTH 2 DAYS Performed at [facility], [address] [place], [place] [address]    Report Status PENDING  Incomplete  Respiratory (~20 pathogens) panel by PCR     Status: None   Collection Time: 07/20/21  8:15 AM   Specimen: Nasopharyngeal Swab; Respiratory  Result Value Ref Range Status  Adenovirus NOT DETECTED NOT DETECTED Final   Coronavirus 229E NOT DETECTED NOT DETECTED Final    Comment: (NOTE) The Coronavirus on the Respiratory Panel, DOES NOT test for the novel  Coronavirus (2019 nCoV)    Coronavirus HKU1 NOT DETECTED NOT DETECTED Final   Coronavirus NL63 NOT DETECTED NOT DETECTED Final   Coronavirus OC43 NOT DETECTED NOT DETECTED Final   Metapneumovirus NOT DETECTED NOT DETECTED Final   Rhinovirus / Enterovirus NOT DETECTED NOT DETECTED Final   Influenza A NOT DETECTED NOT DETECTED Final   Influenza B NOT DETECTED NOT DETECTED Final   Parainfluenza Virus 1 NOT DETECTED NOT DETECTED Final   Parainfluenza Virus 2 NOT DETECTED NOT DETECTED Final   Parainfluenza Virus 3 NOT DETECTED NOT DETECTED Final   Parainfluenza Virus 4 NOT DETECTED NOT DETECTED Final   Respiratory Syncytial Virus NOT DETECTED NOT DETECTED Final   Bordetella pertussis NOT DETECTED NOT DETECTED Final   Bordetella Parapertussis NOT DETECTED NOT DETECTED Final   Chlamydophila pneumoniae NOT DETECTED NOT DETECTED Final   Mycoplasma pneumoniae NOT DETECTED NOT DETECTED Final    Comment: Performed at [facility], [place] [address], [place], [place] [address]  MRSA Next Gen by PCR, Nasal     Status: None   Collection Time: 07/20/21  8:15 AM   Specimen: Nasopharyngeal Swab; Nasal Swab  Result Value Ref Range Status   MRSA by PCR Next Gen NOT DETECTED NOT DETECTED Final    Comment: (NOTE) The GeneXpert MRSA Assay (FDA approved for NASAL specimens only), is one component of a comprehensive MRSA colonization surveillance program. It is not intended to diagnose MRSA infection nor to guide or monitor treatment for MRSA infections. Test performance is not FDA approved in patients less than 83 years old. Performed at [facility], [address], [place], [place] [address]          Radiology Studies: CT ABDOMEN PELVIS WO CONTRAST  Result Date: 07/20/2021 CLINICAL DATA:  Altered mental status with fever, tachycardia and tachypnea. EXAM: CT ABDOMEN AND PELVIS WITHOUT CONTRAST TECHNIQUE: Multidetector CT imaging of the abdomen and pelvis was performed following the standard protocol without IV contrast. RADIATION DOSE REDUCTION: This exam was performed according to the departmental dose-optimization program which includes automated exposure control, adjustment of the mA and/or kV according to patient size and/or use of iterative reconstruction technique. COMPARISON:  None. FINDINGS: Lower chest: The study is markedly limited secondary to patient motion. Marked severity multifocal infiltrates and extensive bronchiectasis are seen throughout both lung bases. Hepatobiliary: 13 mm and 18 mm diameter cysts are seen within the anterior aspect of the right lobe of the liver. A 10 mm cystic appearing area is seen within the inferior aspect of the right lobe. There is mild central intrahepatic biliary dilatation. The gallbladder is mildly distended without evidence of gallstones, gallbladder wall thickening or pericholecystic fluid. Pancreas: Unremarkable. No pancreatic ductal dilatation or surrounding  inflammatory changes. Spleen: Punctate calcified granulomas are seen scattered throughout the parenchyma of a small spleen. Adrenals/Urinary Tract: Adrenal glands are unremarkable. Kidneys are normal in size. A 2.0 cm diameter cyst is seen along the posterior aspect of the mid left kidney. There is marked severity bilateral hydronephrosis and proximal hydroureter without obstructing renal calculi. Bilateral nonspecific perinephric inflammatory fat stranding is seen. The urinary bladder is markedly distended. Stomach/Bowel: There is a large hiatal hernia. Appendix appears normal. No evidence of bowel wall thickening, distention, or inflammatory changes. Noninflamed diverticula are seen throughout the large  bowel. Vascular/Lymphatic: Mild aortic atherosclerosis. No enlarged abdominal or pelvic lymph nodes. Reproductive: Uterus and bilateral adnexa are unremarkable. Other: No abdominal wall hernia or abnormality. No abdominopelvic ascites. Musculoskeletal: Marked severity multilevel degenerative changes are seen throughout the lumbar spine. IMPRESSION: 1. Marked severity bibasilar multifocal infiltrates. 2. Marked severity bilateral hydronephrosis and proximal hydroureter in the setting of a markedly distended urinary bladder. 3. Large hiatal hernia. 4. Colonic diverticulosis. 5. Marked severity multilevel degenerative changes throughout the lumbar spine. 6. Aortic atherosclerosis. Aortic Atherosclerosis (ICD10-I70.0). Electronically Signed   By: Virgina Norfolk M.D.   On: 07/20/2021 02:02   CT CHEST WO CONTRAST  Result Date: 07/20/2021 CLINICAL DATA:  81 year old female with history of community-acquired pneumonia. EXAM: CT CHEST WITHOUT CONTRAST TECHNIQUE: Multidetector CT imaging of the chest was performed following the standard protocol without IV contrast. RADIATION DOSE REDUCTION: This exam was performed according to the departmental dose-optimization program which includes automated exposure control,  adjustment of the mA and/or kV according to patient size and/or use of iterative reconstruction technique. COMPARISON:  No priors. FINDINGS: Cardiovascular: Heart size is normal. There is no significant pericardial fluid, thickening or pericardial calcification. Aortic atherosclerosis. No coronary artery calcifications. Mediastinum/Nodes: No pathologically enlarged mediastinal or hilar lymph nodes. Please note that accurate exclusion of hilar adenopathy is limited on noncontrast CT scans. Large hiatal hernia. No axillary lymphadenopathy. Lungs/Pleura: Study is limited by considerable patient respiratory motion. With these limitations in mind, there are widespread areas of cylindrical, varicose and occasional cystic bronchiectasis, with extensive thickening of the peribronchovascular interstitium, regional areas of architectural distortion, and extensive areas of peribronchovascular micro and macronodularity, most compatible with infected areas of mucoid impaction. No pleural effusions. Upper Abdomen: Aortic atherosclerosis. Musculoskeletal: There are no aggressive appearing lytic or blastic lesions noted in the visualized portions of the skeleton. IMPRESSION: 1. 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 Given the widespread micro and macronodularity, acute superinfection is not excluded. 2. Large hiatal hernia. 3. Aortic atherosclerosis. Aortic Atherosclerosis (ICD10-I70.0). Electronically Signed   By: Vinnie Langton M.D.   On: 07/20/2021 09:11   DG Chest Port 1 View  Result Date: 07/19/2021 CLINICAL DATA:  Suspected sepsis.  Altered mental status. EXAM: PORTABLE CHEST 1 VIEW COMPARISON:  None. FINDINGS: The heart is enlarged. There is a retrocardiac hiatal hernia. Patchy airspace opacities throughout the right lung and at the left lung base. There is background interstitial thickening. No significant  pleural effusion. No pneumothorax. The bones are under mineralized. No acute osseous findings. IMPRESSION: 1. Patchy airspace opacities throughout the right lung and at the left lung base. Differential considerations include multifocal pneumonia, including atypical organisms, or asymmetric pulmonary edema. 2. Cardiomegaly and hiatal hernia. Electronically Signed   By: Keith Rake M.D.   On: 07/19/2021 21:52        Scheduled Meds:  Chlorhexidine Gluconate Cloth  6 each Topical Daily   enoxaparin (LOVENOX) injection  30 mg Subcutaneous Q24H   levothyroxine  75 mcg Oral Daily   metoprolol succinate  25 mg Oral Daily   verapamil  60 mg Oral Q12H   Continuous Infusions:  levofloxacin (LEVAQUIN) IV 500 mg (07/20/21 2254)   sodium bicarbonate 150 mEq in D5W infusion       LOS: 2 days    Time spent: 30 min    Desma Maxim, MD [facility]   If 7PM-7AM, please contact night-coverage [email or web address] Password [email or web address] 07/21/2021, 11:08 AM

## 2021-07-21 NOTE — Progress Notes (Signed)
Initial Nutrition Assessment ? ?DOCUMENTATION CODES:  ? ?Underweight ? ?INTERVENTION:  ? ?-Ensure Enlive po TID, each supplement provides 350 kcal and 20 grams of protein.  ?-MVI with minerals daily ?-Mighty Shake TID, each supplement provides 260 kcals and 6 grams protein ?-Feeding assistance with meals ? ?NUTRITION DIAGNOSIS:  ? ?Inadequate oral intake related to lethargy/confusion as evidenced by per patient/family report. ? ?GOAL:  ? ?Patient will meet greater than or equal to 90% of their needs ? ?MONITOR:  ? ?PO intake, Supplement acceptance, Diet advancement, Labs, Weight trends, Skin, I & O's ? ?REASON FOR ASSESSMENT:  ? ?Rounds ?  ? ?ASSESSMENT:  ? ?Lauren Conner is a 81 y.o. female with medical history significant for CKD4, anemia of CKD, paroxysmal SVT, hypothyroidism, bronchiectasis, HTN, who lives alone and is functionally independent who was brought in for evaluation of weakness and confusion that was noted on the day of arrival.  History is limited due to altered mental status patient appeared to complain of right-sided flank pain ? ?Pt admitted with multifocal pneumonia and severe sepsis.  ? ?3/6- s/p BSE- advanced to full liquid diet ?  ?Reviewed I/O's: +440 ml x 24 hours and +690 ml since admission ? ?UOP: 900 ml x 24 hours ? ?Per PCCM notes, pt with I[facility] with plans to transfer to [facility] after neuro eval.  ? ?Attempted to speak with pt, however, receiving nursing care at time of visit. Unable to obtain history of complete nutrition-focused physical exam at this time.  ? ?Reviewed wt hx. Per Care[facility]where, pt was 119# on 12/20/20. Pt has experienced a 16% wt loss over the past 7 months, which is significant for time frame.  ? ?Pt's mentation is significantly affecting pt's ability to take PO's (SLP reports pt was unable to participate in BSE until late this morning). SLP also doubt's pt's ability to take adequate PO's. RD will add supplements to help pt meet nutritional needs. RD suspects malnutrition,  however, unable to identify at this time.  ? ?Medications reviewed.  ? ?Labs reviewed: Na: 133.  ? ?Diet Order:   ?Diet Order   ? ?       ?  Diet full liquid Room service appropriate? Yes with Assist; Fluid consistency: Thin  Diet effective now       ?  ? ?  ?  ? ?  ? ? ?EDUCATION NEEDS:  ? ?Not appropriate for education at this time ? ?Skin:  Skin Assessment: Reviewed RN Assessment ? ?Last BM:  07/19/21 ? ?Height:  ? ?Ht Readings from Last 1 Encounters:  ?07/19/21 5\' 3"  (1.6 m)  ? ? ?Weight:  ? ?Wt Readings from Last 1 Encounters:  ?07/19/21 45.3 kg  ? ? ?Ideal Body Weight:  52.3 kg ? ?BMI:  Body mass index is 17.69 kg/m?. ? ?Estimated Nutritional Needs:  ? ?Kcal:  1600-1800 ? ?Protein:  75-90 grams ? ?Fluid:  > 1.6 L ? ? ? ?Loistine Chance, RD, LDN, CDCES ?Registered Dietitian II ?Certified Diabetes Care and Education Specialist ?Please refer to [facility] for RD and/or RD on-call/weekend/after hours pager  ?

## 2021-07-21 NOTE — Progress Notes (Signed)
Pharmacy Antibiotic Note ? ?Lauren Conner is a 81 y.o. female admitted on 07/19/2021 with pneumonia.  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 Pharmacy has been consulted for levofloxacin dosing. ? ?Presented with right flank pain and altered mental status. UA without pyuria. Systemic WBC elevated, febrile, tachycardic, and tachypneic. CT abdomen showing hydronephrosis and proximal hydroureter. ? ?Renal function c/w baseline (Scr BL 2). ? ?Plan: Continue levofloxacin 500 mg every 48 hours. ? ?Monitor renal function, culture results, length of therapy. ? ?Height: 5\' 3"  (160 cm) ?Weight: 45.3 kg (99 lb 13.9 oz) ?IBW/kg (Calculated) : 52.4 ? ?Temp (24hrs), Avg:98.3 ?F (36.8 ?C), Min:98.1 ?F (36.7 ?C), Max:98.6 ?F (37 ?C) ? ?Recent Labs  ?Lab 07/19/21 ?2120 07/19/21 ?2125 07/19/21 ?2200 07/20/21 ?0016 07/20/21 ?0745 07/20/21 ?1328 07/21/21 ?0414  ?WBC 17.1*  --   --   --  13.6*  --  11.0*  ?CREATININE  --   --  2.13*  --  2.07*  --  2.39*  ?LATICACIDVEN  --  3.5*  --  2.3*  --  1.6  --   ? ?  ?Estimated Creatinine Clearance: 13.6 mL/min (A) (by C-G formula based on SCr of 2.39 mg/dL (H)).   ? ?Allergies  ?Allergen Reactions  ? Ethambutol Other (See Comments)  ?  Patient reports cramping  ? Iodinated Contrast Media Rash  ? ? ?Antimicrobials this admission: ?Levofloxacin 3/5 >>  ? ?Dose adjustments this admission: none ? ? ?Microbiology results: ? BCx: ng x 2d ?UCx: in process ?RPV: negative ?MRSA PCR: not detected ? ?Thank you for allowing pharmacy to be a part of this patient?s care. ? ?Wynelle Cleveland, PharmD ?Pharmacy Resident  ?07/21/2021 ?9:19 AM ? ? ? ?

## 2021-07-21 NOTE — H&P (Signed)
NAME:  Lauren Conner, MRN:  [id number], DOB:  11/16/1940, LOS: 0 ADMISSION DATE:  07/21/2021, CONSULTATION DATE:  07/21/21 REFERRING MD:  [facility], CHIEF COMPLAINT:  Fall, ICH   History of Present Illness:  Malcolm Hetz is a 81 y.o. F with PMH significant for SVT, HTN, bronchiectasis with MAI infection followed at [facility] and completed 52yrs Azith and Rifampin, CKD 4 who initially presented to [facility] after a fall with altered mental status.  Her family reports that she is normally mobile and very independent.  Family found her confused on the floor with bruising to the face, so brought her into the ED.  She was encephalopathic and febrile, CXR with multi-focal opacities.   She was admitted, but remained encephalopathic so CTH was obtained which showed large R tempoparietal IPH with 80mm midline shift and vasogenic edema.   She was transferred from [facility] to [facility] for further management.   Family reports recent syncopal episodes over the last few months, she was seen by cardiology and holter monitor was negative, however she continued to have these episodes periodically   Pertinent  Medical History  SVT, HTN, bronchiectasis with MAI infection, CKD 4  Significant Hospital Events: Including procedures, antibiotic start and stop dates in addition to other pertinent events   3/5 presented to [facility], admitted to hospitalists 3/6 txfr to [facility] for 3% after [place] showed large IPH with edema and midline shift   Significant studies:  3/6 CTH: Large area of acute intraparenchymal hemorrhage within the RIGHT temporoparietal lobe 4.0 x 4.0 x 4.1 cm with surrounding vasogenic edema and mild effacement of the occipital horn and atrium of the RIGHT lateral ventricle.  Small amount of subarachnoid hemorrhage within sulci at the anterior RIGHT temporal lobe.  3 mm of RIGHT to LEFT midline shift.  3/5 CT abd/pelvis IMPRESSION: Marked severity bibasilar multifocal infiltrates. Marked severity bilateral hydronephrosis and proximal  hydroureter in the setting of a markedly distended urinary bladder.   Interim History / Subjective:   Pt arrived to [facility] hemodynamically stable  Objective   Blood pressure 133/75, pulse 91, resp. rate (!) 28, SpO2 95 %.        Intake/Output Summary (Last 24 hours) at 07/21/2021 1712 Last data filed at 07/21/2021 1630 Gross per 24 hour  Intake --  Output 100 ml  Net -100 ml   There were no vitals filed for this visit.  General:  thin, elderly F, acute on chronically ill-appearing not in distress HEENT: MM pink/dry, sclera anicteric, pupils equal Neuro: sleeping but arousable to voice, answers questions with one word answers with stimulation, neglecting the LUE and LLE but able to move wit CV: s1s2 rr, no m/r/g PULM:  scattered rhonchi in the bilateral bases, no significant wheezing and no accessory muscle on RA GI: soft, bsx4 active  Extremities: warm/dry, no edema  Skin: no rashes or lesions   Resolved Hospital Problem list     Assessment & Plan:   Acute R tempoparietal IPH in the setting of AMS and fall Possibly hypertensive though may be traumatic, though spoke with neurology and the extent of vasogenic edema may possibly suggest underlying mass -admit to ICU, serial neuro checks -MRI and MRA brain -hold off 3% per neuro, start if clinically worsening  -currently protecting her airway, family would want intubation if indicated  -SBP goal 130-150 -supportive care, normothermia, euglycemia -appreciate Neurology recomendations -cardene as needed    Possible CAP Baseline Bronchiectasis not currently on chronic antibiotics -continue Levaquin -extended RVP negative,  BC with NGTD -Procal 0.5 and CRP elevated    Acute on Chronic renal failure, baseline CKD 4 Creatinine 2.3 today with baseline ~2 -continue to monitor UOP, renal indices and electrolytes  -duonebs prn   Acute Urinary Retention with hydronephrosis  -continue foley, UC negative   Hypothyroidism   -continue Synthroid when able to swallow, check TSH    Best Practice (right click and "Reselect all SmartList Selections" daily)   Diet/type: NPO DVT prophylaxis: SCD GI prophylaxis: N/A Lines: N/A Foley:  Yes, and it is still needed Code Status:  full code Last date of multidisciplinary goals of care discussion [Full code and full scope of care per discussions with Pt's son ERIC who is POA]  Labs   CBC: Recent Labs  Lab 07/19/21 2120 07/20/21 0745 07/21/21 0414  WBC 17.1* 13.6* 11.0*  HGB 10.2* 9.1* 7.6*  HCT 31.8* 27.5* 23.1*  MCV 95.5 93.5 92.0  PLT 343 273 711    Basic Metabolic Panel: Recent Labs  Lab 07/19/21 2200 07/20/21 0745 07/21/21 0414  NA 126* 129* 133*  K 4.7 4.5 4.1  CL 97* 102 105  CO2 17* 16* 16*  GLUCOSE 157* 116* 90  BUN 41* 39* 47*  CREATININE 2.13* 2.07* 2.39*  CALCIUM 8.9 8.3* 8.1*   GFR: Estimated Creatinine Clearance: 13.6 mL/min (A) (by C-G formula based on SCr of 2.39 mg/dL (H)). Recent Labs  Lab 07/19/21 2120 07/19/21 2125 07/20/21 0016 07/20/21 0745 07/20/21 1328 07/21/21 0414  PROCALCITON  --   --   --  0.32  --  0.51  WBC 17.1*  --   --  13.6*  --  11.0*  LATICACIDVEN  --  3.5* 2.3*  --  1.6  --     Liver Function Tests: Recent Labs  Lab 07/19/21 2200 07/21/21 0414  AST 35 20  ALT 15 12  ALKPHOS 68 53  BILITOT 0.7 0.6  PROT 7.9 6.0*  ALBUMIN 3.1* 2.4*   No results for input(s): LIPASE, AMYLASE in the last 168 hours. No results for input(s): AMMONIA in the last 168 hours.  ABG No results found for: PHART, PCO2ART, PO2ART, HCO3, TCO2, ACIDBASEDEF, O2SAT   Coagulation Profile: Recent Labs  Lab 07/20/21 0016  INR 1.2    Cardiac Enzymes: No results for input(s): CKTOTAL, CKMB, CKMBINDEX, TROPONINI in the last 168 hours.  HbA1C: No results found for: HGBA1C  CBG: No results for input(s): GLUCAP in the last 168 hours.  Review of Systems:   Unable to obtain secondary to mental status  Past Medical  History:  She,  has no past medical history on file.   Surgical History:  No past surgical history on file.   Social History:      Family History:  Her family history is not on file.   Allergies Allergies  Allergen Reactions   Ethambutol Other (See Comments)    Patient reports cramping   Iodinated Contrast Media Rash     Home Medications  Prior to Admission medications   Medication Sig Start Date End Date Taking? Authorizing Provider  albuterol (PROVENTIL) (2.5 MG/3ML) 0.083% nebulizer solution Take 2.5 mg by nebulization every 6 (six) hours as needed for wheezing.    [provider]  albuterol (VENTOLIN HFA) 108 (90 Base) MCG/ACT inhaler Inhale 2 puffs into the lungs every 4 (four) hours as needed for wheezing.    [provider]  fluticasone-salmeterol (ADVAIR DISKUS) 250-50 MCG/ACT AEPB Inhale 1 puff into the lungs every 12 (twelve) hours.  [provider]  levofloxacin (LEVAQUIN) 500 MG/100ML SOLN Inject 100 mLs (500 mg total) into the vein every other day. 07/22/21   Wouk, Ailene Rud, MD  levothyroxine (SYNTHROID) 75 MCG tablet Take 75 mcg by mouth daily. 07/08/21   [provider]  niCARdipine in saline (CARDENE-IV) 20-0.86 MG/200ML-% SOLN Inject 3-15 mg/hr into the vein continuous. 07/21/21   Wouk, Ailene Rud, MD     Critical care time: 45 minutes     CRITICAL CARE Performed by: Otilio Carpen Jacquilyn Seldon   Total critical care time: 45 minutes  Critical care time was exclusive of separately billable procedures and treating other patients.  Critical care was necessary to treat or prevent imminent or life-threatening deterioration.  Critical care was time spent personally by me on the following activities: development of treatment plan with patient and/or surrogate as well as nursing, discussions with consultants, evaluation of patient's response to treatment, examination of patient, obtaining history from patient or surrogate, ordering and  performing treatments and interventions, ordering and review of laboratory studies, ordering and review of radiographic studies, pulse oximetry and re-evaluation of patient's condition.   Otilio Carpen Verda Mehta, PA-C [facility] See Amion for pager If no response to pager , please call [phone] until 7pm After 7:00 pm call Elink  [phone]

## 2021-07-21 NOTE — Progress Notes (Addendum)
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  She was admitted with altered mental status and confusion was unable to provide details of history however was noted to be mildly febrile and tachycardic and tachypneic.  She was found to have leukocytosis with elevated lactate and metabolic acidosis [id number] and influenza testing was negative on admission she was started empirically on antibiotics including cefepime Vanco and Flagyl and admitted to hospitalist service patient did have chest imaging on arrival including CT chest without contrast which I reviewed personally.  Pictorial documentation of chest imaging is included below and does show that significant motion artifact bilaterally however notable cylindrical and saccular bronchiectatic changes with chronic bronchitic airways bilaterally with mucoid impaction and bibasilar fibrotic changes.  Additionally there is a large hiatal hernia.  Patient also did have CT abdomen which I reviewed and shows bilateral hydronephrosis and proximal hydroureter.  07/21/21-  patient has [facility] and has plan for transfer to [facility] after neurology evaluation.    PAST MEDICAL HISTORY   History reviewed. No pertinent past medical history.   SURGICAL HISTORY   History reviewed. No pertinent surgical  history.   FAMILY HISTORY   History reviewed. No pertinent family history.   SOCIAL HISTORY       MEDICATIONS    Home Medication:     Current Medication:  Current Facility-Administered Medications:    acetaminophen (TYLENOL) tablet 650 mg, 650 mg, Oral, Q6H PRN, 650 mg at 07/21/21 1009 **OR** acetaminophen (TYLENOL) suppository 650 mg, 650 mg, Rectal, Q6H PRN, [name], MD, 650 mg at 07/20/21 1142   Chlorhexidine Gluconate Cloth 2 % PADS 6 each, 6 each, Topical, Daily, Wouk, Ailene Rud, MD   ipratropium-albuterol (DUONEB) 0.5-2.5 (3) MG/3ML nebulizer solution 3 mL, 3 mL, Nebulization, Q6H PRN, Ottie Glazier, MD, 3 mL at 07/21/21 0036   levofloxacin (LEVAQUIN) IVPB 500 mg, 500 mg, Intravenous, Q48H, Darrick Penna, [facility], Last Rate: 100 mL/hr at 07/20/21 2254, 500 mg at 07/20/21 2254   levothyroxine (SYNTHROID) tablet 75 mcg, 75 mcg, Oral, Daily, Wouk, Ailene Rud, MD   metoprolol succinate (TOPROL-XL) 24 hr tablet 25 mg, 25 mg, Oral, Daily, Wouk, Ailene Rud, MD, 25 mg at 07/21/21 1009   metoprolol tartrate (LOPRESSOR) injection 5 mg, 5 mg, Intravenous, Q5 min PRN, Wouk, Ailene Rud, MD, 5 mg at 07/20/21 1346   nicardipine (CARDENE) 20mg  in 0.86% saline 265ml IV infusion (0.1 mg/ml), 3-15 mg/hr, Intravenous, Continuous, Wouk, Ailene Rud, MD   OLANZapine zydis (ZYPREXA) disintegrating tablet 5 mg, 5 mg, Oral, Daily, Wouk, Ailene Rud, MD, 5 mg at 07/21/21 1215   ondansetron (ZOFRAN) tablet 4 mg, 4 mg, Oral, Q6H PRN **OR** ondansetron (ZOFRAN) injection 4 mg, 4 mg, Intravenous, Q6H PRN, [name], MD, 4 mg at 07/21/21 1009  sodium bicarbonate 150 mEq in dextrose 5 % 1,150 mL infusion, , Intravenous, Continuous, Wouk, Ailene Rud, MD, Last Rate: 75 mL/hr at 07/21/21 1139, New Bag at 07/21/21 1139   thiamine 500mg  in normal saline (66ml) IVPB, 500 mg, Intravenous, TID, Wouk, Ailene Rud, MD    ALLERGIES   Ethambutol and Iodinated contrast  media     REVIEW OF SYSTEMS    Review of Systems:  Gen:  Denies  fever, sweats, chills weigh loss  HEENT: Denies blurred vision, double vision, ear pain, eye pain, hearing loss, nose bleeds, sore throat Cardiac:  No dizziness, chest pain or heaviness, chest tightness,edema Resp:   Denies cough or sputum porduction, shortness of breath,wheezing, hemoptysis,  Gi: Denies swallowing difficulty, stomach pain, nausea or vomiting, diarrhea, constipation, bowel incontinence Gu:  Denies bladder incontinence, burning urine Ext:   Denies Joint pain, stiffness or swelling Skin: Denies  skin rash, easy bruising or bleeding or hives Endoc:  Denies polyuria, polydipsia , polyphagia or weight change Psych:   Denies depression, insomnia or hallucinations   Other:  All other systems negative   VS: BP (!) 156/72 (BP Location: Left Arm)    Pulse 84    Temp (!) 97.5 F (36.4 C) (Axillary)    Resp (!) 25    Ht 5\' 3"  (1.6 m)    Wt 45.3 kg    SpO2 96%    BMI 17.69 kg/m      PHYSICAL EXAM    GENERAL:NAD, no fevers, chills, no weakness no fatigue HEAD: Normocephalic, atraumatic.  EYES: Pupils equal, round, reactive to light. Extraocular muscles intact. No scleral icterus.  MOUTH: Moist mucosal membrane. Dentition intact. No abscess noted.  EAR, NOSE, THROAT: Clear without exudates. No external lesions.  NECK: Supple. No thyromegaly. No nodules. No JVD.  PULMONARY: reduced breath entry LLL CARDIOVASCULAR: S1 and S2. Regular rate and rhythm. No murmurs, rubs, or gallops. No edema. Pedal pulses 2+ bilaterally.  GASTROINTESTINAL: Soft, nontender, nondistended. No masses. Positive bowel sounds. No hepatosplenomegaly.  MUSCULOSKELETAL: No swelling, clubbing, or edema. Range of motion full in all extremities.  NEUROLOGIC: Cranial nerves II through XII are intact. No gross focal neurological deficits. Sensation intact. Reflexes intact.  SKIN: No ulceration, lesions, rashes, or cyanosis. Skin warm and dry.  Turgor intact.  PSYCHIATRIC: Mood, affect within normal limits. The patient is awake, alert and oriented x 3. Insight, judgment intact.       IMAGING    CT ABDOMEN PELVIS WO CONTRAST  Result Date: 07/20/2021 CLINICAL DATA:  Altered mental status with fever, tachycardia and tachypnea. EXAM: CT ABDOMEN AND PELVIS WITHOUT CONTRAST TECHNIQUE: Multidetector CT imaging of the abdomen and pelvis was performed following the standard protocol without IV contrast. RADIATION DOSE REDUCTION: This exam was performed according to the departmental dose-optimization program which includes automated exposure control, adjustment of the mA and/or kV according to patient size and/or use of iterative reconstruction technique. COMPARISON:  None. FINDINGS: Lower chest: The study is markedly limited secondary to patient motion. Marked severity multifocal infiltrates and extensive bronchiectasis are seen throughout both lung bases. Hepatobiliary: 13 mm and 18 mm diameter cysts are seen within the anterior aspect of the right lobe of the liver. A 10 mm cystic appearing area is seen within the inferior aspect of the right lobe. There is mild central intrahepatic biliary dilatation. The gallbladder is mildly distended without evidence of gallstones, gallbladder wall thickening or pericholecystic fluid. Pancreas: Unremarkable. No pancreatic ductal dilatation or surrounding inflammatory changes. Spleen: Punctate calcified  granulomas are seen scattered throughout the parenchyma of a small spleen. Adrenals/Urinary Tract: Adrenal glands are unremarkable. Kidneys are normal in size. A 2.0 cm diameter cyst is seen along the posterior aspect of the mid left kidney. There is marked severity bilateral hydronephrosis and proximal hydroureter without obstructing renal calculi. Bilateral nonspecific perinephric inflammatory fat stranding is seen. The urinary bladder is markedly distended. Stomach/Bowel: There is a large hiatal hernia. Appendix  appears normal. No evidence of bowel wall thickening, distention, or inflammatory changes. Noninflamed diverticula are seen throughout the large bowel. Vascular/Lymphatic: Mild aortic atherosclerosis. No enlarged abdominal or pelvic lymph nodes. Reproductive: Uterus and bilateral adnexa are unremarkable. Other: No abdominal wall hernia or abnormality. No abdominopelvic ascites. Musculoskeletal: Marked severity multilevel degenerative changes are seen throughout the lumbar spine. IMPRESSION: 1. Marked severity bibasilar multifocal infiltrates. 2. Marked severity bilateral hydronephrosis and proximal hydroureter in the setting of a markedly distended urinary bladder. 3. Large hiatal hernia. 4. Colonic diverticulosis. 5. Marked severity multilevel degenerative changes throughout the lumbar spine. 6. Aortic atherosclerosis. Aortic Atherosclerosis (ICD10-I70.0). Electronically Signed   By: Virgina Norfolk M.D.   On: 07/20/2021 02:02   CT CHEST WO CONTRAST  Result Date: 07/20/2021 CLINICAL DATA:  81 year old female with history of community-acquired pneumonia. EXAM: CT CHEST WITHOUT CONTRAST TECHNIQUE: Multidetector CT imaging of the chest was performed following the standard protocol without IV contrast. RADIATION DOSE REDUCTION: This exam was performed according to the departmental dose-optimization program which includes automated exposure control, adjustment of the mA and/or kV according to patient size and/or use of iterative reconstruction technique. COMPARISON:  No priors. FINDINGS: Cardiovascular: Heart size is normal. There is no significant pericardial fluid, thickening or pericardial calcification. Aortic atherosclerosis. No coronary artery calcifications. Mediastinum/Nodes: No pathologically enlarged mediastinal or hilar lymph nodes. Please note that accurate exclusion of hilar adenopathy is limited on noncontrast CT scans. Large hiatal hernia. No axillary lymphadenopathy. Lungs/Pleura: Study is limited  by considerable patient respiratory motion. With these limitations in mind, there are widespread areas of cylindrical, varicose and occasional cystic bronchiectasis, with extensive thickening of the peribronchovascular interstitium, regional areas of architectural distortion, and extensive areas of peribronchovascular micro and macronodularity, most compatible with infected areas of mucoid impaction. No pleural effusions. Upper Abdomen: Aortic atherosclerosis. Musculoskeletal: There are no aggressive appearing lytic or blastic lesions noted in the visualized portions of the skeleton. IMPRESSION: 1. 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 Given the widespread micro and macronodularity, acute superinfection is not excluded. 2. Large hiatal hernia. 3. Aortic atherosclerosis. Aortic Atherosclerosis (ICD10-I70.0). Electronically Signed   By: Vinnie Langton M.D.   On: 07/20/2021 09:11   DG Chest Port 1 View  Result Date: 07/19/2021 CLINICAL DATA:  Suspected sepsis.  Altered mental status. EXAM: PORTABLE CHEST 1 VIEW COMPARISON:  None. FINDINGS: The heart is enlarged. There is a retrocardiac hiatal hernia. Patchy airspace opacities throughout the right lung and at the left lung base. There is background interstitial thickening. No significant pleural effusion. No pneumothorax. The bones are under mineralized. No acute osseous findings. IMPRESSION: 1. Patchy airspace opacities throughout the right lung and at the left lung base. Differential considerations include multifocal pneumonia, including atypical organisms, or asymmetric pulmonary edema. 2. Cardiomegaly and hiatal hernia. Electronically Signed   By: Keith Rake M.D.   On: 07/19/2021 21:52      ASSESSMENT/PLAN   Acute exacerbation of underlying bronchiectasis    -Patient does have a  history of treated MAI    -There are chronic bronchitic  changes with saccular bronchiectasis with calcified spleen which may be seen in fungal endemic infection, as well as sarcoidosis.  -There is significant mucoid impaction bilaterally  -Patient received vancomycin, MRSA PCR is negative and this therapy has been de-escalated -We will add levofloxacin due to UTI coupled with chronic MAI infection -Exacerbation likely due to UTI sepsis   ICH   Family reports fall with resultant facial trauma at home some time ago.    - she is s/p neurology and plan for [facility] transfer for additional evaluation.   Sepsis due to urinary source Present on admission with CT abdomen showing hydronephrosis -Adding levofloxacin to antimicrobial regimen -MRSA PCR is negative may DC vancomycin -Procalcitonin likely of limited utility due to CKD as well as MAI infection which generally does not correlate well with procalcitonin   Moderate protein calorie malnutrition BMI less than 18 -Bitemporal and peripheral muscle wasting -Large sized hiatal hernia may be hindering normal GI function and contributing to microaspiration -Patient has been evaluated by SLP speech pathologist with recommendation for n.p.o. status due to encephalopathy on admission -Patient would benefit from official video swallow to elucidate severity of aspiration.    Thank you for allowing me to participate in the care of this patient.   Patient has severe medical issues which are life threatening and being managed during this visit  Patient/Family are satisfied with care plan and all questions have been answered.  This document was prepared using Dragon voice recognition software and may include unintentional dictation errors.     Ottie Glazier, M.D.  Division of [place]

## 2021-07-21 NOTE — Evaluation (Signed)
Clinical/Bedside Swallow Evaluation Patient Details  Name: Lauren Conner MRN: [id number] Date of Birth: 1940-08-08  Today's Date: 07/21/2021 Time: SLP Start Time (ACUTE ONLY): 54 SLP Stop Time (ACUTE ONLY): 1025 SLP Time Calculation (min) (ACUTE ONLY): 20 min  Past Medical History: History reviewed. No pertinent past medical history. Past Surgical History: History reviewed. No pertinent surgical history. HPI:  Per 86 H&P "HPI: Lauren Conner is a 81 y.o. female with medical history significant for CKD4, anemia of CKD, paroxysmal SVT, hypothyroidism, bronchiectasis, HTN, who lives alone and is functionally independent who was brought in for evaluation of weakness and confusion that was noted on the day of arrival.  History is limited due to altered mental status patient appeared to complain of right-sided flank pain  ED course: On arrival febrile to 100.5 tachycardic to 124 and tachypneic to 23-24.  BP 154/121, O2 sat of 100% on room air  Blood work significant for leukocytosis of 17,000 with lactic acid 3.5.  Creatinine 2.13, close to baseline of 2, with bicarb of 17.  Sodium 126.  Troponin 33.  Urinalysis with proteinuria but not consistent with UTI.  COVID and flu negative  EKG, personally reviewed and interpreted: Sinus tachycardia at 127 with no acute ST-T wave changes  Chest x-ray showing patchy airspace opacities throughout the right lung and at the left lung base with differential considerations including multifocal pneumonia, including atypical organisms or asymmetric pulmonary edema.  Patient started on fluids per sepsis protocol, IV cefepime Vanco and Flagyl.  Hospitalist consulted for admission.      Following admission, CT abdomen and pelvis ordered for right flank pain with finding of severe bilateral hydronephrosis and proximal hydroureter ureter with severely distended bladder.  Foley catheter ordered for acute urinary retention" CT Chest 07/20/21 "1. 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 Given the widespread micro and  macronodularity, acute superinfection is not excluded.  2. Large hiatal hernia.  3. Aortic atherosclerosis."    Assessment / Plan / Recommendation  Clinical Impression  Pt seen for clinical swallowing evaluation. Pt alert, confusion evident. Yelling for people who are not present in room. Eyes closed intermittently. Cleared with RN. Son present. Son noted pt consumed a regular diet with thin liquids and typically consumed 1.5 "meals" with some "snacks" throughout the day PTA. Son denies pt with any swallowing difficulty PTA.    Per chart review, temp WNL and WBC trending downward. CT Chest, 07/20/21, "1. The appearance of the lungs is most compatible with a chronic indolent atypical infectious process such as MAI (mycobacteriumavium intracellulare). Given the widespread micro and macronodularity, acute superinfection is not excluded. 2. Large hiatal hernia. 3. Aortic atherosclerosis."  Pt given trials of thin liquids, pureed, and solid. Pt presents with s/sx moderate-severe oral dysphagia. Oral deficits appear to be related to AMS as no obvious oral-motor weakness/incoordination is appreciated at this time. With thin liquids, pt tending to take rapid, sequential sips of thin liquids. Pt benefited from pinching of straw by SLP to limit rate of intake. With pureed, pt with reduced mouth opening (inconsistently) which limited amount of bolus acceptance to less than 1/4 teaspoon at times. With solid, pt held anteriorly between lips without attempts to transit posteriorly. Solid was removed from mouth by SLP. Pt's pharyngeal swallow appeared to be [facility]. No overt or subtle s/sx pharyngeal dysphagia. To palpation, pt with seemingly timely swallow initiation and seemingly adequate  laryngeal elevation. No change to vocal quality across trials. Of note, x1 belch noted  following consecutive straw sips which may be indicative of esophageal dysphagia. Noted "large hiatal hernia" on CT chest.   Pt is at increased risk for aspiration/aspiration PNA given AMS, medical comorbidities, and dependence for feeding at present.   Recommend cautious initiation of a full liquid diet (thin liquids) with safe swallowing strategies and aspiration precautions as outlined below. Pt will require 1:1 assistance with feeding as well as verbal cues/encouragement for PO intake. ?ability to meet nutritional needs orally. RD consult recommended.   SLP to f/u per POC for diet tolerance and trials of upgraded textures, as appropriate.  SLP Visit Diagnosis: Dysphagia, oral phase (R13.11)    Aspiration Risk  Moderate aspiration risk;Risk for inadequate nutrition/hydration    Diet Recommendation Thin liquid (FULL LIQUIDS; thin liquids)   Medication Administration: Crushed with puree Supervision: Full supervision/cueing for compensatory strategies;Staff to assist with self feeding Compensations: Minimize environmental distractions;Slow rate;Small sips/bites (pinch straw to limit liquid intake) Postural Changes: Seated upright at 90 degrees;Remain upright for at least 30 minutes after po intake    Other  Recommendations Recommended Consults:  (RD consult) Oral Care Recommendations: Oral care BID;Oral care before and after PO;Staff/trained caregiver to provide oral care    Recommendations for follow up therapy are one component of a multi-disciplinary discharge planning process, led by the attending physician.  Recommendations may be updated based on patient status, additional functional criteria and insurance authorization.  Follow up Recommendations  (TBD)      Assistance Recommended at Discharge Frequent or constant Supervision/Assistance  Functional Status Assessment    Frequency and Duration min 2x/week  2 weeks       Prognosis Prognosis for Safe Diet Advancement:  Fair Barriers to Reach Goals: Cognitive deficits;Severity of deficits;Behavior      Swallow Study   General Date of Onset: 07/19/21 HPI: Per 60 H&P "HPI: EILIDH MARCANO is a 81 y.o. female with medical history significant for CKD4, anemia of CKD, paroxysmal SVT, hypothyroidism, bronchiectasis, HTN, who lives alone and is functionally independent who was brought in for evaluation of weakness and confusion that was noted on the day of arrival.  History is limited due to altered mental status patient appeared to complain of right-sided flank pain  ED course: On arrival febrile to 100.5 tachycardic to 124 and tachypneic to 23-24.  BP 154/121, O2 sat of 100% on room air  Blood work significant for leukocytosis of 17,000 with lactic acid 3.5.  Creatinine 2.13, close to baseline of 2, with bicarb of 17.  Sodium 126.  Troponin 33.  Urinalysis with proteinuria but not consistent with UTI.  COVID and flu negative  EKG, personally reviewed and interpreted: Sinus tachycardia at 127 with no acute ST-T wave changes  Chest x-ray showing patchy airspace opacities throughout the right lung and at the left lung base with differential considerations including multifocal pneumonia, including atypical organisms or asymmetric pulmonary edema.  Patient started on fluids per sepsis protocol, IV cefepime Vanco and Flagyl.  Hospitalist consulted for admission.      Following admission, CT abdomen and pelvis ordered for right flank pain with finding of severe bilateral hydronephrosis and proximal hydroureter ureter with severely distended bladder.  Foley catheter ordered for acute urinary retention" CT Chest 07/20/21 "1. 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 Given the widespread micro and  macronodularity, acute superinfection is not excluded.  2. Large hiatal hernia.  3. Aortic atherosclerosis." Type of Study: Bedside  Swallow Evaluation Previous Swallow Assessment: unknown Diet Prior to this Study: NPO Temperature Spikes Noted: No Respiratory Status: Room air History of Recent Intubation: No Behavior/Cognition: Confused;Doesn't follow directions Oral Cavity Assessment: Within Functional Limits Oral Care Completed by SLP: Yes Oral Cavity - Dentition: Adequate natural dentition Vision:  (unable to assess) Self-Feeding Abilities: Needs assist;Total assist Patient Positioning: Upright in bed Baseline Vocal Quality: Normal Volitional Cough: Cognitively unable to elicit    Oral/Motor/Sensory Function Overall Oral Motor/Sensory Function: Within functional limits - not appreciated functionally; speech is clear; face symmetrical   Thin Liquid Thin Liquid: Within functional limits Presentation: Straw Other Comments: tended to have a rapid rate of intake; benefited from pinching of straw to limit rate of intake    Puree Puree: Impaired Oral Phase Impairments:  (reduced mouth opening inconsistently) Oral Phase Functional Implications:  (limited bolus acceptance)   Solid     Solid: Impaired Oral Phase Impairments: Poor awareness of bolus Oral Phase Functional Implications:  (held anteriorly between lips without attempts to transit posteriorly; removed from mouth by SLP)     Cherrie Gauze, M.S., [facility] [phone] (ASCOM)   Clearnce Sorrel Angelize Ryce 07/21/2021,11:45 AM

## 2021-07-21 NOTE — Progress Notes (Signed)
07:45- Dr. Si Raider paged and notified of drop in Hgb. No bowel movements or blood in urine. ? ?09:30- MD notified that patient c/o headache and morphine givenx1, but patient is now difficult to console. Patient not following all commands.  ? ?12:40- Patient transferred to CT scan.  ? ?13:15- Dr. Si Raider notified this RN that CT scans have resulted and plan to move patient to our ICU versus [place]. Blood pressure systolic goal of under 003 established. IV metoprolol given PRN per orders.  ? ?14:30- Patient transferred to ICU. SBAR report given to [employer]. Family at bedside and updated on plan of care.  ? ? ?

## 2021-07-21 NOTE — Progress Notes (Signed)
Dr. Si Raider notified that patient continually yelling for someone to "help" her. She does c/o headache. Unable to follow commands well to swallow pills. Awaiting SLP consult for swallow. Morphine 1mg  given earlier this AM. Son at bedside attempting to calm patient. Patient repositioned and provided emotional support.  ? ? ? 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 ? ?

## 2021-07-21 NOTE — Progress Notes (Signed)
PHARMACY NOTE:  ANTIMICROBIAL RENAL DOSAGE ADJUSTMENT ? ?Current antimicrobial regimen includes a mismatch between antimicrobial dosage and estimated renal function.  As per policy approved by the Pharmacy & Therapeutics and Medical Executive Committees, the antimicrobial dosage will be adjusted accordingly. ? ?Current antimicrobial dosage:  Levaquin 500mg  IV Q24H ? ?Indication: PNA ? ?Renal Function: ? ?Estimated Creatinine Clearance: 13.6 mL/min (A) (by C-G formula based on SCr of 2.39 mg/dL (H)). ?[]      On intermittent HD, scheduled: ?[]      On CRRT ?   ?Antimicrobial dosage has been changed to:  Levaquin 500mg  IV Q48H ? ?Jaquelynn Wanamaker D. Mina Marble, PharmD, BCPS, BCCCP ?07/21/2021, 5:32 PM ? ?

## 2021-07-21 NOTE — Consult Note (Signed)
NEURO HOSPITALIST CONSULT NOTE   Requestig physician: Dr. Si Raider  Reason for Consult: Subacute right parietal lobe ICH.   History obtained from:  Family and Chart     HPI:                                                                                                                                          Lauren Conner is an 81 y.o. female who presented to the hospital on Saturday for evaluation of acute alteration in her level of consciousness with left sided weakness. Family states that the patient was LKN on Friday at about noon, with normal speech and no visible weakness. At that time, however, she was complaining of feeling unwell. Family went to see her on Saturday and when she was not responding to them, her son felt that it would be necessary to kick her door down to gain entry. On entering her home, she was found down on her left side, confused and unable to get up. She also had left sided bruising suggesting that she fell.   She was admitted to the hospital on Saturday night. Per the Hospitalist admission note: "Lauren Conner is a 81 y.o. female with medical history significant for CKD4, anemia of CKD, paroxysmal SVT, hypothyroidism, bronchiectasis, HTN, who lives alone and is functionally independent who was brought in for evaluation of weakness and confusion that was noted on the day of arrival.  History is limited due to altered mental status patient appeared to complain of right-sided flank pain ED course: On arrival febrile to 100.5 tachycardic to 124 and tachypneic to 23-24.  BP 154/121, O2 sat of 100% on room air Blood work significant for leukocytosis of 17,000 with lactic acid 3.5.  Creatinine 2.13, close to baseline of 2, with bicarb of 17.  Sodium 126.  Troponin 33.  Urinalysis with proteinuria but not consistent with UTI.  COVID and flu negative. EKG, personally reviewed and interpreted: Sinus tachycardia at 127 with no acute ST-T wave changes Chest x-ray showing  patchy airspace opacities throughout the right lung and at the left lung base with differential considerations including multifocal pneumonia, including atypical organisms or asymmetric pulmonary edema. Patient started on fluids per sepsis protocol, IV cefepime Vanco and Flagyl.  Hospitalist consulted for admission. Following admission, CT abdomen and pelvis ordered for right flank pain with finding of severe bilateral hydronephrosis and proximal hydroureter ureter with severely distended bladder.  Foley catheter ordered for acute urinary retention."  Today, a CT head was obtained, revealing a right parietal lobe ICH with associated mass effect as well as adjacent subarachnoid hemorrhage." Neurology was consulted to further evaluate.     PMHx: As per HPI  No PSHx on file in [place].  No FHx on file in [place].  Social History:  has no history on file for tobacco use, alcohol use, and drug use.  Allergies  Allergen Reactions   Ethambutol Other (See Comments)    Patient reports cramping   Iodinated Contrast Media Rash    MEDICATIONS:                                                                                                                     Scheduled:  Chlorhexidine Gluconate Cloth  6 each Topical Daily   feeding supplement  237 mL Oral TID BM   levothyroxine  75 mcg Oral Daily   metoprolol succinate  25 mg Oral Daily   multivitamin with minerals  1 tablet Oral Daily   OLANZapine zydis  5 mg Oral Daily   Continuous:  levofloxacin (LEVAQUIN) IV 500 mg (07/20/21 2254)   niCARDipine 3 mg/hr (07/21/21 1531)   sodium bicarbonate 150 mEq in D5W infusion 75 mL/hr at 07/21/21 1139   thiamine injection       ROS:                                                                                                                                       Unable to obtain due to AMS.    Blood pressure (!) 155/74, pulse 75, temperature (!) 97.5 F (36.4 C), temperature source Axillary, resp.  rate 20, height 5\' 3"  (1.6 m), weight 45.3 kg, SpO2 95 %.   General Examination:                                                                                                       Physical Exam  HEENT-  [place]/AT    Lungs- Respirations unlabored Extremities- Warm and well perfused. RUE bruising is noted.   Neurological Examination Mental Status: Drowsy. Will awaken after family calls her name repeatedly. Speech is sparse but fluent with slight dysarthria. Poor orientation. Naming intact for her nose, her ear and her chin, but not her thumb or pinky finger.  Left sided neglect. Mild agitation when attempting to examine. Able to follow simple commands, but overall poorly cooperative.  Cranial Nerves: II: Left visual field cut. PERRL at 3 >> 2 mm III,IV, VI: No ptosis. EOM are conjugate with right gaze preference. Able to cross midline to left but cannot gaze fully to the left and gaze quickly returns to the right.  V: Significantly decreased sensation to gross touch on the left.  VII: No gross facial droop on limited exam.  VIII: Hearing intact to voice IX,X: No hypophonia or hoarseness XI: Head is preferentially rotated to the right XII: Tongue protrudes slightly to the left.  Motor/Sensory: Legs in fetal position with patient laying on her right side at beginning of exam.  RUE and RLE move with full strength but limited cooperation with exam.  LUE exam limited by left hemineglect. Will move antigravity semipurposefully with some painful stimuli, but does not appear aware of all left sided stimuli.  LLE with decreased spontaneous movement compared to the right. Will extend less vigorously than the right when asked, but both LE revert quickly back to fetal position.  Reacts to right sided stimuli normally. Left with intermittent responses to painful stimuli.  Left hemineglect noted.  Deep Tendon Reflexes: 2+ bilateral brachioradialis. Not cooperative with lower extremity reflexes.   Cerebellar: No gross ataxia on the right. No ataxia disproportionate to weakness on the left.  Gait: Unable to assess   Lab Results: Basic Metabolic Panel: Recent Labs  Lab 07/19/21 2200 07/20/21 0745 07/21/21 0414  NA 126* 129* 133*  K 4.7 4.5 4.1  CL 97* 102 105  CO2 17* 16* 16*  GLUCOSE 157* 116* 90  BUN 41* 39* 47*  CREATININE 2.13* 2.07* 2.39*  CALCIUM 8.9 8.3* 8.1*    CBC: Recent Labs  Lab 07/19/21 2120 07/20/21 0745 07/21/21 0414  WBC 17.1* 13.6* 11.0*  HGB 10.2* 9.1* 7.6*  HCT 31.8* 27.5* 23.1*  MCV 95.5 93.5 92.0  PLT 343 273 238    Cardiac Enzymes: No results for input(s): CKTOTAL, CKMB, CKMBINDEX, TROPONINI in the last 168 hours.  Lipid Panel: No results for input(s): CHOL, TRIG, HDL, CHOLHDL, VLDL, LDLCALC in the last 168 hours.  Imaging: CT ABDOMEN PELVIS WO CONTRAST  Result Date: 07/20/2021 CLINICAL DATA:  Altered mental status with fever, tachycardia and tachypnea. EXAM: CT ABDOMEN AND PELVIS WITHOUT CONTRAST TECHNIQUE: Multidetector CT imaging of the abdomen and pelvis was performed following the standard protocol without IV contrast. RADIATION DOSE REDUCTION: This exam was performed according to the departmental dose-optimization program which includes automated exposure control, adjustment of the mA and/or kV according to patient size and/or use of iterative reconstruction technique. COMPARISON:  None. FINDINGS: Lower chest: The study is markedly limited secondary to patient motion. Marked severity multifocal infiltrates and extensive bronchiectasis are seen throughout both lung bases. Hepatobiliary: 13 mm and 18 mm diameter cysts are seen within the anterior aspect of the right lobe of the liver. A 10 mm cystic appearing area is seen within the inferior aspect of the right lobe. There is mild central intrahepatic biliary dilatation. The gallbladder is mildly distended without evidence of gallstones, gallbladder wall thickening or pericholecystic fluid.  Pancreas: Unremarkable. No pancreatic ductal dilatation or surrounding inflammatory changes. Spleen: Punctate calcified granulomas are seen scattered throughout the parenchyma of a small spleen. Adrenals/Urinary Tract: Adrenal glands are unremarkable. Kidneys are normal in size. A 2.0 cm diameter cyst is seen along the posterior aspect of the mid left kidney. There is marked  severity bilateral hydronephrosis and proximal hydroureter without obstructing renal calculi. Bilateral nonspecific perinephric inflammatory fat stranding is seen. The urinary bladder is markedly distended. Stomach/Bowel: There is a large hiatal hernia. Appendix appears normal. No evidence of bowel wall thickening, distention, or inflammatory changes. Noninflamed diverticula are seen throughout the large bowel. Vascular/Lymphatic: Mild aortic atherosclerosis. No enlarged abdominal or pelvic lymph nodes. Reproductive: Uterus and bilateral adnexa are unremarkable. Other: No abdominal wall hernia or abnormality. No abdominopelvic ascites. Musculoskeletal: Marked severity multilevel degenerative changes are seen throughout the lumbar spine. IMPRESSION: 1. Marked severity bibasilar multifocal infiltrates. 2. Marked severity bilateral hydronephrosis and proximal hydroureter in the setting of a markedly distended urinary bladder. 3. Large hiatal hernia. 4. Colonic diverticulosis. 5. Marked severity multilevel degenerative changes throughout the lumbar spine. 6. Aortic atherosclerosis. Aortic Atherosclerosis (ICD10-I70.0). Electronically Signed   By: Virgina Norfolk M.D.   On: 07/20/2021 02:02   CT CHEST WO CONTRAST  Result Date: 07/20/2021 CLINICAL DATA:  81 year old female with history of community-acquired pneumonia. EXAM: CT CHEST WITHOUT CONTRAST TECHNIQUE: Multidetector CT imaging of the chest was performed following the standard protocol without IV contrast. RADIATION DOSE REDUCTION: This exam was performed according to the departmental  dose-optimization program which includes automated exposure control, adjustment of the mA and/or kV according to patient size and/or use of iterative reconstruction technique. COMPARISON:  No priors. FINDINGS: Cardiovascular: Heart size is normal. There is no significant pericardial fluid, thickening or pericardial calcification. Aortic atherosclerosis. No coronary artery calcifications. Mediastinum/Nodes: No pathologically enlarged mediastinal or hilar lymph nodes. Please note that accurate exclusion of hilar adenopathy is limited on noncontrast CT scans. Large hiatal hernia. No axillary lymphadenopathy. Lungs/Pleura: Study is limited by considerable patient respiratory motion. With these limitations in mind, there are widespread areas of cylindrical, varicose and occasional cystic bronchiectasis, with extensive thickening of the peribronchovascular interstitium, regional areas of architectural distortion, and extensive areas of peribronchovascular micro and macronodularity, most compatible with infected areas of mucoid impaction. No pleural effusions. Upper Abdomen: Aortic atherosclerosis. Musculoskeletal: There are no aggressive appearing lytic or blastic lesions noted in the visualized portions of the skeleton. IMPRESSION: 1. 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 Given the widespread micro and macronodularity, acute superinfection is not excluded. 2. Large hiatal hernia. 3. Aortic atherosclerosis. Aortic Atherosclerosis (ICD10-I70.0). Electronically Signed   By: Vinnie Langton M.D.   On: 07/20/2021 09:11   DG Chest Port 1 View  Result Date: 07/19/2021 CLINICAL DATA:  Suspected sepsis.  Altered mental status. EXAM: PORTABLE CHEST 1 VIEW COMPARISON:  None. FINDINGS: The heart is enlarged. There is a retrocardiac hiatal hernia. Patchy airspace opacities throughout the right lung and at the left lung  base. There is background interstitial thickening. No significant pleural effusion. No pneumothorax. The bones are under mineralized. No acute osseous findings. IMPRESSION: 1. Patchy airspace opacities throughout the right lung and at the left lung base. Differential considerations include multifocal pneumonia, including atypical organisms, or asymmetric pulmonary edema. 2. Cardiomegaly and hiatal hernia. Electronically Signed   By: Keith Rake M.D.   On: 07/19/2021 21:52     Assessment: 81 year old female with subacute right parietal lobe ICH with associated edema, mass effect and adjacent subarachnoid hemorrhage.  1. Exam reveals findings referable to her moderate-sized right parietal lobe hemorrhage. 2. CT head: 4 x 4 x 4.1 cm right temporoparietal lobe hemorrhage with surrounding vasogenic edema and mild effacement of the occipital horn  and atrium of the right lateral ventricle. Small amount of subarachnoid hemorrhage within sulci at the anterior right temporal lobe. 3 mm of right to left midline shift.   Recommendations: 1. Transfer to the ICU service at [facility]. Neurology to consult on arrival. Until transfer can be made, will need to be under the care of the ICU service at [facility].  2. MRI of brain. MRA of head.  3. Carotid ultrasound 4. TTE 5. PT consult, OT consult, Speech consult 6. Cardiac telemetry 7. Frequent neuro checks 8. No antiplatelet medications or anticoagulants 9. Hold off on hypertonic saline for now given mild mass effect as well as most likely age of the hemorrhage being 3 days. Repeat CT head in 24 hours (ordered for 1 PM tomorrow). If mass effect worsens, then start hypertonic saline. If she clinically worsens prior to the 24 hour time interval, then obtain STAT head CT and call Neurology 10. BP management with nicardipine drip with goal SBP of 130-150 11. DVT prophylaxis with SCDs.   Electronically signed: Dr. Kerney Elbe 07/21/2021, 2:39 PM

## 2021-07-21 NOTE — Discharge Summary (Signed)
Lauren Conner BBC:[id number] DOB: January 12, 1941 DOA: 07/19/2021  PCP: [facility]  Admit date: 07/19/2021 Discharge date: 07/21/2021  Time spent: 90 minutes   Discharge Diagnoses:  Principal Problem:   Multifocal pneumonia Active Problems:   Severe sepsis ([place])   Acute metabolic encephalopathy   Metabolic acidosis   Hyponatremia   Essential hypertension   Adult bronchiectasis (HCC)   Hypothyroidism (acquired)   CKD (chronic kidney disease) stage 4, GFR 15-29 ml/min (HCC)   Paroxysmal SVT (supraventricular tachycardia) (HCC)   Anemia of chronic kidney failure, stage 4 (severe) (HCC)   Acute urinary retention   Bilateral hydronephrosis   Discharge Condition: stable  Diet recommendation: npo  Filed Weights   07/19/21 2312  Weight: 45.3 kg    History of present illness:   Lauren Conner is a 81 y.o. female with medical history significant for CKD4, anemia of CKD, paroxysmal SVT, hypothyroidism, bronchiectasis, HTN, who lives alone and is functionally independent who was brought in for evaluation of weakness and confusion that was noted on the day of arrival.  History is limited due to altered mental status patient appeared to complain of right-sided flank pain  Hospital Course:   # Intracranial hemorrhage Remained encephalopathic on hospital day and complained of headache. CT head (final read pending at time of this note) shows 4 cm pariet-temporal bleed with mass effect). On further questioning of family appears there was a recent fall at home. Neurosurgery consulted, advised no neurosurgical intervention. Neurology consulted. DVT ppx discontinued. Nicardipine gtt started to maintain bp 130-150. Transferred to [facility] ICU-level care, our ICU does not manage intracranial hemorrhage. Neurology Shona Needles) will consult. Family updated.  # Sepsis, severe By elevated lactate, fever, tachycardia. 2/2 uti, possible CAP. 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 Followed by pulm but last visit was 2021. Not on o2 at home. Here septic, tachypnic. Not hypoxic but will monitor closely. Flu/covid negative. CT findings appear mostly stable. Pulm following. Mrsa swab neg. Respiratory panel neg - s/p vanc/cefepime - levofloxacin started 3/5 - f/u blood cultures, ngtd - f/u urine strep and legionella antigens   # Acute urinary retention # Acute cystitis? Son says has a history of urinary retention. CT showing severe hydro, distended bladder, no obstructing stone. Foley placed 3/4 in the ED with 1100 ml output.  - abx as above - maintain Foley - f/u urine culture   # Acute encephalopathy 2/2 infection, ICH   # Hyponatremia Subacute on chronic. Baseline is around 130, here 126 on presentation likely 2/2 dehydration as has improved today to 133 with fluids.   - continue fluids and trend   # Metabolic acidosis 2/2 infection - switched to sodium bicarb   # Elevated troponin No EKG changes to suggest acs. Likely demand.   # Anemia Hgb drop to 7.6 today, ICH may be contributing - monitor   # Hypothyroid - cont home levothyroxine   # HTN Here hypertensive - nicardipine gtt as above   # Hx pSVT   # CKD stage 4 Appears most recent creatinine is around 2 and here low 2s   Procedures: none   Consultations: Neurology, pulmonology  Discharge Exam: Vitals:   07/21/21 1353 07/21/21 1415  BP: (!) 152/74 (!) 155/74  Pulse: 72 75  Resp: 18 20  Temp:    SpO2: 95%     General exam: Appears calm Respiratory system: tachypnic, scattered rhonchi Cardiovascular system: S1 &  S2 heard, tachycardic. No JVD, murmurs, rubs, gallops or clicks. No pedal edema. Gastrointestinal system: Abdomen is nondistended, soft and nontender. No organomegaly or masses felt. Normal bowel sounds heard. Central nervous system: Opens eyes to voice, doesn't respond. Moves all 4 ext Extremities: warm Skin: No  rashes, lesions or ulcers Psychiatry: unable to assess  Discharge Instructions   Discharge Instructions     Increase activity slowly   Complete by: As directed       Allergies as of 07/21/2021       Reactions   Ethambutol Other (See Comments)   Patient reports cramping   Iodinated Contrast Media Rash        Medication List     STOP taking these medications    acetaminophen 650 MG CR tablet Commonly known as: TYLENOL   metoprolol succinate 25 MG 24 hr tablet Commonly known as: TOPROL-XL   verapamil 120 MG CR tablet Commonly known as: CALAN-SR       TAKE these medications    Advair Diskus 250-50 MCG/ACT Aepb Generic drug: fluticasone-salmeterol Inhale 1 puff into the lungs every 12 (twelve) hours.   albuterol (2.5 MG/3ML) 0.083% nebulizer solution Commonly known as: PROVENTIL Take 2.5 mg by nebulization every 6 (six) hours as needed for wheezing.   albuterol 108 (90 Base) MCG/ACT inhaler Commonly known as: VENTOLIN HFA Inhale 2 puffs into the lungs every 4 (four) hours as needed for wheezing.   levofloxacin 500 MG/100ML Soln Commonly known as: LEVAQUIN Inject 100 mLs (500 mg total) into the vein every other day. Start taking on: July 22, 2021   levothyroxine 75 MCG tablet Commonly known as: SYNTHROID Take 75 mcg by mouth daily.   niCARdipine in saline 20-0.86 MG/200ML-% Soln Commonly known as: CARDENE-IV Inject 3-15 mg/hr into the vein continuous.       Allergies  Allergen Reactions   Ethambutol Other (See Comments)    Patient reports cramping   Iodinated Contrast Media Rash      The results of significant diagnostics from this hospitalization (including imaging, microbiology, ancillary and laboratory) are listed below for reference.    Significant Diagnostic Studies: CT ABDOMEN PELVIS WO CONTRAST  Result Date: 07/20/2021 CLINICAL DATA:  Altered mental status with fever, tachycardia and tachypnea. EXAM: CT ABDOMEN AND PELVIS WITHOUT  CONTRAST TECHNIQUE: Multidetector CT imaging of the abdomen and pelvis was performed following the standard protocol without IV contrast. RADIATION DOSE REDUCTION: This exam was performed according to the departmental dose-optimization program which includes automated exposure control, adjustment of the mA and/or kV according to patient size and/or use of iterative reconstruction technique. COMPARISON:  None. FINDINGS: Lower chest: The study is markedly limited secondary to patient motion. Marked severity multifocal infiltrates and extensive bronchiectasis are seen throughout both lung bases. Hepatobiliary: 13 mm and 18 mm diameter cysts are seen within the anterior aspect of the right lobe of the liver. A 10 mm cystic appearing area is seen within the inferior aspect of the right lobe. There is mild central intrahepatic biliary dilatation. The gallbladder is mildly distended without evidence of gallstones, gallbladder wall thickening or pericholecystic fluid. Pancreas: Unremarkable. No pancreatic ductal dilatation or surrounding inflammatory changes. Spleen: Punctate calcified granulomas are seen scattered throughout the parenchyma of a small spleen. Adrenals/Urinary Tract: Adrenal glands are unremarkable. Kidneys are normal in size. A 2.0 cm diameter cyst is seen along the posterior aspect of the mid left kidney. There is marked severity bilateral hydronephrosis and proximal hydroureter without obstructing renal calculi. Bilateral nonspecific perinephric  inflammatory fat stranding is seen. The urinary bladder is markedly distended. Stomach/Bowel: There is a large hiatal hernia. Appendix appears normal. No evidence of bowel wall thickening, distention, or inflammatory changes. Noninflamed diverticula are seen throughout the large bowel. Vascular/Lymphatic: Mild aortic atherosclerosis. No enlarged abdominal or pelvic lymph nodes. Reproductive: Uterus and bilateral adnexa are unremarkable. Other: No abdominal wall  hernia or abnormality. No abdominopelvic ascites. Musculoskeletal: Marked severity multilevel degenerative changes are seen throughout the lumbar spine. IMPRESSION: 1. Marked severity bibasilar multifocal infiltrates. 2. Marked severity bilateral hydronephrosis and proximal hydroureter in the setting of a markedly distended urinary bladder. 3. Large hiatal hernia. 4. Colonic diverticulosis. 5. Marked severity multilevel degenerative changes throughout the lumbar spine. 6. Aortic atherosclerosis. Aortic Atherosclerosis (ICD10-I70.0). Electronically Signed   By: Virgina Norfolk M.D.   On: 07/20/2021 02:02   CT CHEST WO CONTRAST  Result Date: 07/20/2021 CLINICAL DATA:  81 year old female with history of community-acquired pneumonia. EXAM: CT CHEST WITHOUT CONTRAST TECHNIQUE: Multidetector CT imaging of the chest was performed following the standard protocol without IV contrast. RADIATION DOSE REDUCTION: This exam was performed according to the departmental dose-optimization program which includes automated exposure control, adjustment of the mA and/or kV according to patient size and/or use of iterative reconstruction technique. COMPARISON:  No priors. FINDINGS: Cardiovascular: Heart size is normal. There is no significant pericardial fluid, thickening or pericardial calcification. Aortic atherosclerosis. No coronary artery calcifications. Mediastinum/Nodes: No pathologically enlarged mediastinal or hilar lymph nodes. Please note that accurate exclusion of hilar adenopathy is limited on noncontrast CT scans. Large hiatal hernia. No axillary lymphadenopathy. Lungs/Pleura: Study is limited by considerable patient respiratory motion. With these limitations in mind, there are widespread areas of cylindrical, varicose and occasional cystic bronchiectasis, with extensive thickening of the peribronchovascular interstitium, regional areas of architectural distortion, and extensive areas of peribronchovascular micro and  macronodularity, most compatible with infected areas of mucoid impaction. No pleural effusions. Upper Abdomen: Aortic atherosclerosis. Musculoskeletal: There are no aggressive appearing lytic or blastic lesions noted in the visualized portions of the skeleton. IMPRESSION: 1. 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 Given the widespread micro and macronodularity, acute superinfection is not excluded. 2. Large hiatal hernia. 3. Aortic atherosclerosis. Aortic Atherosclerosis (ICD10-I70.0). Electronically Signed   By: Vinnie Langton M.D.   On: 07/20/2021 09:11   DG Chest Port 1 View  Result Date: 07/19/2021 CLINICAL DATA:  Suspected sepsis.  Altered mental status. EXAM: PORTABLE CHEST 1 VIEW COMPARISON:  None. FINDINGS: The heart is enlarged. There is a retrocardiac hiatal hernia. Patchy airspace opacities throughout the right lung and at the left lung base. There is background interstitial thickening. No significant pleural effusion. No pneumothorax. The bones are under mineralized. No acute osseous findings. IMPRESSION: 1. Patchy airspace opacities throughout the right lung and at the left lung base. Differential considerations include multifocal pneumonia, including atypical organisms, or asymmetric pulmonary edema. 2. Cardiomegaly and hiatal hernia. Electronically Signed   By: Keith Rake M.D.   On: 07/19/2021 21:52    Microbiology: Recent Results (from the past 240 hour(s))  Resp Panel by RT-PCR (Flu A&B, Covid) Nasopharyngeal Swab     Status: None   Collection Time: 07/19/21  9:17 PM   Specimen: Nasopharyngeal Swab; Nasopharyngeal(NP) swabs in vial transport medium  Result Value Ref Range Status   SARS Coronavirus 2 by RT PCR NEGATIVE NEGATIVE Final    Comment: (NOTE) SARS-CoV-2 target nucleic acids are NOT  DETECTED.  The SARS-CoV-2 RNA is generally detectable in upper  respiratory specimens during the acute phase of infection. The lowest concentration of SARS-CoV-2 viral copies this assay can detect is 138 copies/mL. A negative result does not preclude SARS-Cov-2 infection and should not be used as the sole basis for treatment or other patient management decisions. A negative result may occur with  improper specimen collection/handling, submission of specimen other than nasopharyngeal swab, presence of viral mutation(s) within the areas targeted by this assay, and inadequate number of viral copies(<138 copies/mL). A negative result must be combined with clinical observations, patient history, and epidemiological information. The expected result is Negative.  Fact Sheet for Patients:  EntrepreneurPulse.com.au  Fact Sheet for Healthcare Providers:  IncredibleEmployment.be  This test is no t yet approved or cleared by the [place] FDA and  has been authorized for detection and/or diagnosis of SARS-CoV-2 by FDA under an Emergency Use Authorization (EUA). This EUA will remain  in effect (meaning this test can be used) for the duration of the COVID-19 declaration under Section 564(b)(1) of the Act, 21 U.S.C.section 360bbb-3(b)(1), unless the authorization is terminated  or revoked sooner.       Influenza A by PCR NEGATIVE NEGATIVE Final   Influenza B by PCR NEGATIVE NEGATIVE Final    Comment: (NOTE) The Xpert Xpress SARS-CoV-2/FLU/RSV plus assay is intended as an aid in the diagnosis of influenza from Nasopharyngeal swab specimens and should not be used as a sole basis for treatment. Nasal washings and aspirates are unacceptable for Xpert Xpress SARS-CoV-2/FLU/RSV testing.  Fact Sheet for Patients: EntrepreneurPulse.com.au  Fact Sheet for Healthcare Providers: IncredibleEmployment.be  This test is not yet approved or cleared by the [place] FDA and has been  authorized for detection and/or diagnosis of SARS-CoV-2 by FDA under an Emergency Use Authorization (EUA). This EUA will remain in effect (meaning this test can be used) for the duration of the COVID-19 declaration under Section 564(b)(1) of the Act, 21 U.S.C. section 360bbb-3(b)(1), unless the authorization is terminated or revoked.  Performed at [facility], [address] [place], [place] [address]   Culture, blood (Routine x 2)     Status: None (Preliminary result)   Collection Time: 07/19/21  9:23 PM   Specimen: BLOOD  Result Value Ref Range Status   Specimen Description BLOOD RIGHT ANTECUBITAL  Final   Special Requests   Final    BOTTLES DRAWN AEROBIC AND ANAEROBIC Blood Culture adequate volume   Culture   Final    NO GROWTH 2 DAYS Performed at [facility], [address], [place], [place] [address]    Report Status PENDING  Incomplete  Culture, blood (Routine x 2)     Status: None (Preliminary result)   Collection Time: 07/19/21  9:28 PM   Specimen: BLOOD  Result Value Ref Range Status   Specimen Description BLOOD BLOOD LEFT FOREARM  Final   Special Requests   Final    BOTTLES DRAWN AEROBIC AND ANAEROBIC Blood Culture results may not be optimal due to an excessive volume of blood received in culture bottles   Culture   Final    NO GROWTH 2 DAYS Performed at [facility], [address], [place], [place] [address]    Report Status PENDING  Incomplete  Urine Culture     Status: None   Collection Time: 07/20/21  7:45 AM   Specimen: Urine, Clean Catch  Result Value Ref Range Status   Specimen Description   Final  URINE, CLEAN CATCH Performed at [facility], [address] [place], [place] [address]    Special Requests   Final    NONE Performed at [facility], [address], [place], [place] [address]    Culture   Final    NO GROWTH Performed at [facility], [place] [address], [place], [place] [address]     Report Status 07/21/2021 FINAL  Final  Respiratory (~20 pathogens) panel by PCR     Status: None   Collection Time: 07/20/21  8:15 AM   Specimen: Nasopharyngeal Swab; Respiratory  Result Value Ref Range Status   Adenovirus NOT DETECTED NOT DETECTED Final   Coronavirus 229E NOT DETECTED NOT DETECTED Final    Comment: (NOTE) The Coronavirus on the Respiratory Panel, DOES NOT test for the novel  Coronavirus (2019 nCoV)    Coronavirus HKU1 NOT DETECTED NOT DETECTED Final   Coronavirus NL63 NOT DETECTED NOT DETECTED Final   Coronavirus OC43 NOT DETECTED NOT DETECTED Final   Metapneumovirus NOT DETECTED NOT DETECTED Final   Rhinovirus / Enterovirus NOT DETECTED NOT DETECTED Final   Influenza A NOT DETECTED NOT DETECTED Final   Influenza B NOT DETECTED NOT DETECTED Final   Parainfluenza Virus 1 NOT DETECTED NOT DETECTED Final   Parainfluenza Virus 2 NOT DETECTED NOT DETECTED Final   Parainfluenza Virus 3 NOT DETECTED NOT DETECTED Final   Parainfluenza Virus 4 NOT DETECTED NOT DETECTED Final   Respiratory Syncytial Virus NOT DETECTED NOT DETECTED Final   Bordetella pertussis NOT DETECTED NOT DETECTED Final   Bordetella Parapertussis NOT DETECTED NOT DETECTED Final   Chlamydophila pneumoniae NOT DETECTED NOT DETECTED Final   Mycoplasma pneumoniae NOT DETECTED NOT DETECTED Final    Comment: Performed at [facility], [place] [address] [place], [place] [address]  MRSA Next Gen by PCR, Nasal     Status: None   Collection Time: 07/20/21  8:15 AM   Specimen: Nasopharyngeal Swab; Nasal Swab  Result Value Ref Range Status   MRSA by PCR Next Gen NOT DETECTED NOT DETECTED Final    Comment: (NOTE) The GeneXpert MRSA Assay (FDA approved for NASAL specimens only), is one component of a comprehensive MRSA colonization surveillance program. It is not intended to diagnose MRSA infection nor to guide or monitor treatment for MRSA infections. Test performance is not FDA approved in patients less  than 69 years old. Performed at [facility], [address] [place],  [address]      Labs: Basic Metabolic Panel: Recent Labs  Lab 07/19/21 2200 07/20/21 0745 07/21/21 0414  NA 126* 129* 133*  K 4.7 4.5 4.1  CL 97* 102 105  CO2 17* 16* 16*  GLUCOSE 157* 116* 90  BUN 41* 39* 47*  CREATININE 2.13* 2.07* 2.39*  CALCIUM 8.9 8.3* 8.1*   Liver Function Tests: Recent Labs  Lab 07/19/21 2200 07/21/21 0414  AST 35 20  ALT 15 12  ALKPHOS 68 53  BILITOT 0.7 0.6  PROT 7.9 6.0*  ALBUMIN 3.1* 2.4*   No results for input(s): LIPASE, AMYLASE in the last 168 hours. No results for input(s): AMMONIA in the last 168 hours. CBC: Recent Labs  Lab 07/19/21 2120 07/20/21 0745 07/21/21 0414  WBC 17.1* 13.6* 11.0*  HGB 10.2* 9.1* 7.6*  HCT 31.8* 27.5* 23.1*  MCV 95.5 93.5 92.0  PLT 343 273 238   Cardiac Enzymes: No results for input(s): CKTOTAL, CKMB, CKMBINDEX, TROPONINI in the last 168 hours. BNP: BNP (  last 3 results) No results for input(s): BNP in the last 8760 hours.  ProBNP (last 3 results) No results for input(s): PROBNP in the last 8760 hours.  CBG: No results for input(s): GLUCAP in the last 168 hours.     Signed:  Desma Maxim MD.  [facility] 07/21/2021, 2:49 PM

## 2021-07-21 NOTE — Progress Notes (Signed)
SLP Cancellation Note ? ?Patient Details ?Name: Lauren Conner ?MRN: [id number] ?DOB: 15-Dec-1940 ? ? ?Cancelled treatment:       Reason Eval/Treat Not Completed: Fatigue/lethargy limiting ability to participate;Medical issues which prohibited therapy ? ?Cleared with RN. RN stated pt unable to take pills on night shift (e.g. spitting them out). Son present upon SLP entrance to room. Per son, pt too lethargic/altered for participation in clinical swallowing evaluation as pt given morphine this AM. Son requested SLP return later this AM.  ? ?SLP to continue efforts as appropriate.  ? ?RN made aware of the above.  ? ?Cherrie Gauze, M.S., CCC-SLP ?Speech-Language Pathologist ?[facility] ?[phone] ([place])  ? ?Lauren Conner ?07/21/2021, 8:39 AM ?

## 2021-07-22 ENCOUNTER — Inpatient Hospital Stay (HOSPITAL_COMMUNITY): Payer: Medicare Other

## 2021-07-22 DIAGNOSIS — J471 Bronchiectasis with (acute) exacerbation: Secondary | ICD-10-CM

## 2021-07-22 DIAGNOSIS — I6389 Other cerebral infarction: Secondary | ICD-10-CM | POA: Diagnosis not present

## 2021-07-22 DIAGNOSIS — J189 Pneumonia, unspecified organism: Secondary | ICD-10-CM | POA: Diagnosis not present

## 2021-07-22 DIAGNOSIS — G936 Cerebral edema: Secondary | ICD-10-CM | POA: Diagnosis not present

## 2021-07-22 DIAGNOSIS — I611 Nontraumatic intracerebral hemorrhage in hemisphere, cortical: Secondary | ICD-10-CM | POA: Diagnosis not present

## 2021-07-22 DIAGNOSIS — G9340 Encephalopathy, unspecified: Secondary | ICD-10-CM | POA: Diagnosis not present

## 2021-07-22 LAB — BASIC METABOLIC PANEL
Anion gap: 9 (ref 5–15)
BUN: 46 mg/dL — ABNORMAL HIGH (ref 8–23)
CO2: 18 mmol/L — ABNORMAL LOW (ref 22–32)
Calcium: 7.9 mg/dL — ABNORMAL LOW (ref 8.9–10.3)
Chloride: 105 mmol/L (ref 98–111)
Creatinine, Ser: 2.54 mg/dL — ABNORMAL HIGH (ref 0.44–1.00)
GFR, Estimated: 19 mL/min — ABNORMAL LOW (ref >60)
Glucose, Bld: 51 mg/dL — ABNORMAL LOW (ref 70–99)
Potassium: 4.3 mmol/L (ref 3.5–5.1)
Sodium: 132 mmol/L — ABNORMAL LOW (ref 135–145)

## 2021-07-22 LAB — GLUCOSE, CAPILLARY
Glucose-Capillary: 44 mg/dL — CL (ref 70–99)
Glucose-Capillary: 111 mg/dL — ABNORMAL HIGH (ref 70–99)
Glucose-Capillary: 77 mg/dL (ref 70–99)

## 2021-07-22 LAB — TSH: TSH: 10.941 u[IU]/mL — ABNORMAL HIGH (ref 0.350–4.500)

## 2021-07-22 LAB — ECHOCARDIOGRAM COMPLETE
AR max vel: 2.62 cm2
AV Peak grad: 7.6 mmHg
Ao pk vel: 1.38 m/s
Area-P 1/2: 3.68 cm2
Calc EF: 65.2 %
P 1/2 time: 509 msec
S' Lateral: 2.5 cm
Single Plane A2C EF: 65.8 %
Single Plane A4C EF: 66 %

## 2021-07-22 LAB — CBC
HCT: 24 % — ABNORMAL LOW (ref 36.0–46.0)
Hemoglobin: 7.9 g/dL — ABNORMAL LOW (ref 12.0–15.0)
MCH: 31.2 pg (ref 26.0–34.0)
MCHC: 32.9 g/dL (ref 30.0–36.0)
MCV: 94.9 fL (ref 80.0–100.0)
Platelets: 193 10*3/uL (ref 150–400)
RBC: 2.53 MIL/uL — ABNORMAL LOW (ref 3.87–5.11)
RDW: 13.5 % (ref 11.5–15.5)
WBC: 8.4 10*3/uL (ref 4.0–10.5)
nRBC: 0 % (ref 0.0–0.2)

## 2021-07-22 LAB — LEGIONELLA PNEUMOPHILA SEROGP 1 UR AG: L. pneumophila Serogp 1 Ur Ag: NEGATIVE

## 2021-07-22 LAB — SODIUM: Sodium: 132 mmol/L — ABNORMAL LOW (ref 135–145)

## 2021-07-22 LAB — MAGNESIUM: Magnesium: 1.7 mg/dL (ref 1.7–2.4)

## 2021-07-22 LAB — PHOSPHORUS: Phosphorus: 4 mg/dL (ref 2.5–4.6)

## 2021-07-22 IMAGING — CT CT HEAD W/O CM
3 of 4 series · 15 of 47 positions shown, 18 images · non-contrast
Comparison: CT head without contrast [DATE]. MR head and MRA
head [DATE].

CLINICAL DATA: Intracranial hemorrhage.



[Series 4: head 2.0 h70h · axial · 0.41mm/px · z∈[-195,-61]mm · 9 of 85 slices shown, 12 images]
[im 9/85  brain]
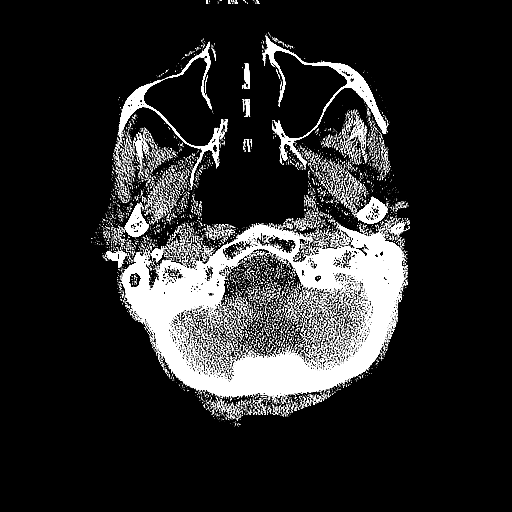
[im 9/85  bone]
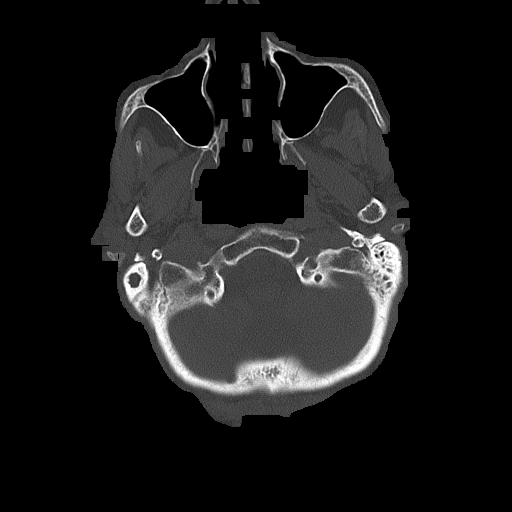
[im 17/85  brain]
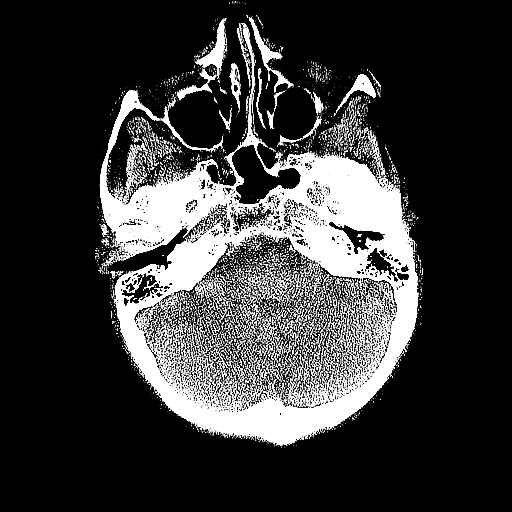
[im 26/85  brain]
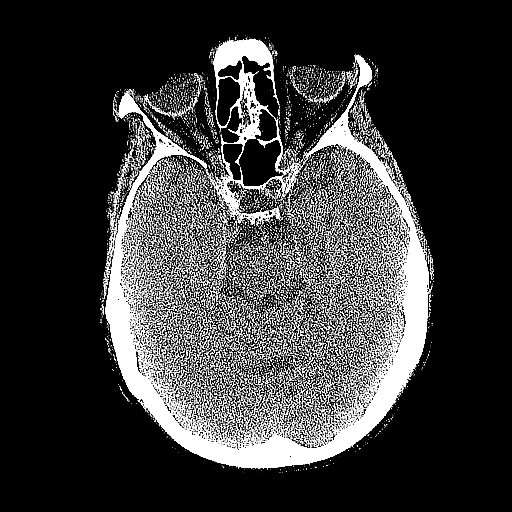
[im 34/85  brain]
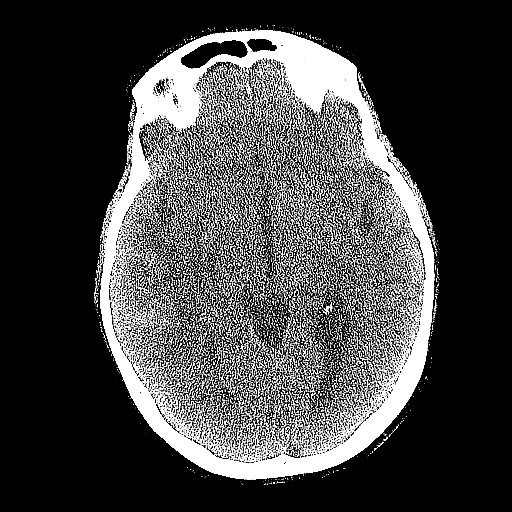
[im 43/85  brain]
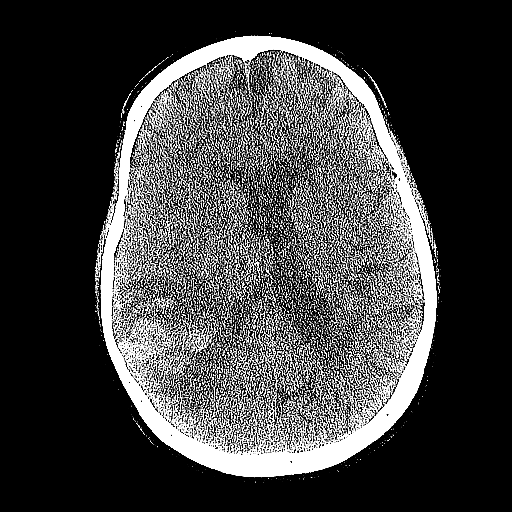
[im 43/85  bone]
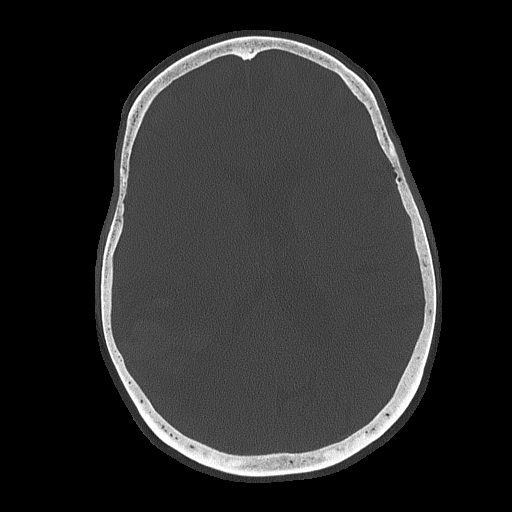
[im 51/85  brain]
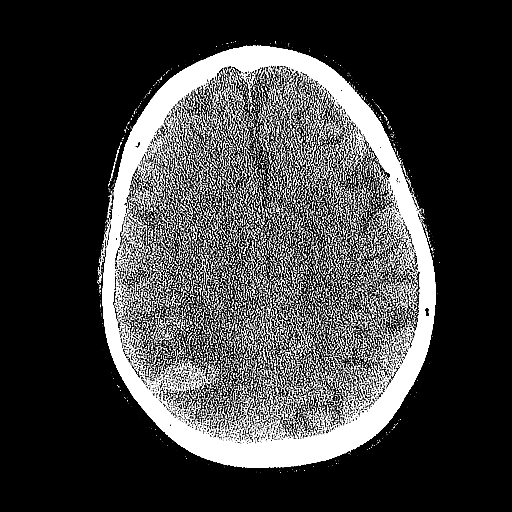
[im 59/85  brain]
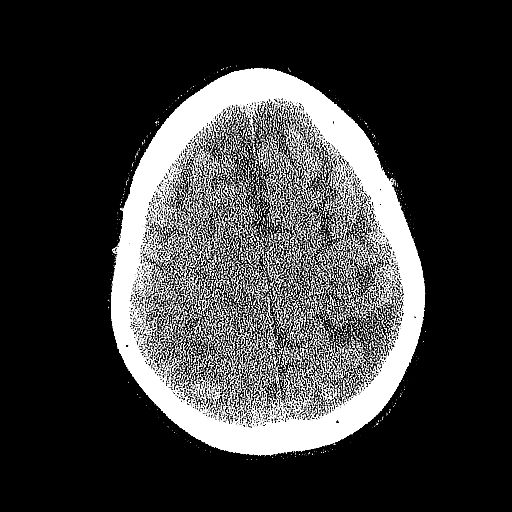
[im 68/85  brain]
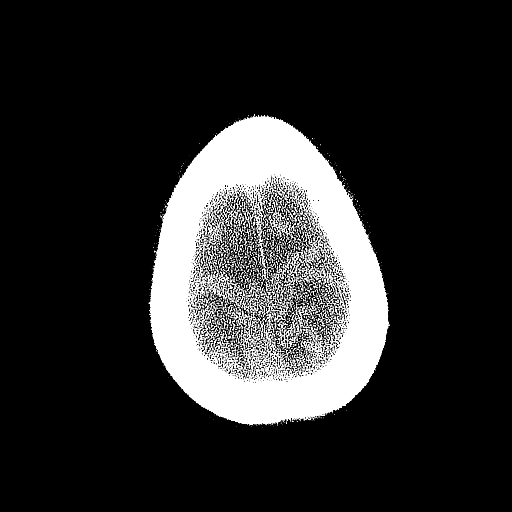
[im 76/85  brain]
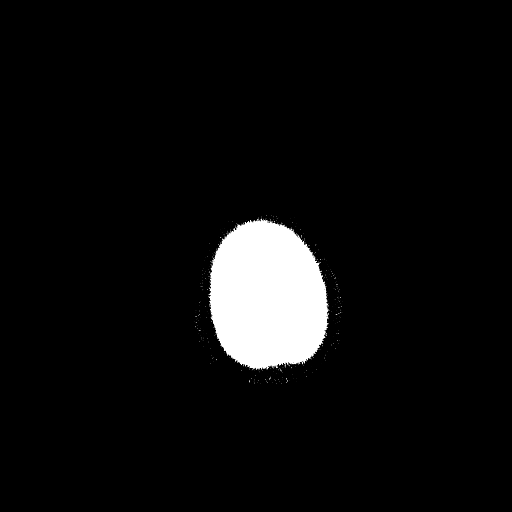
[im 76/85  bone]
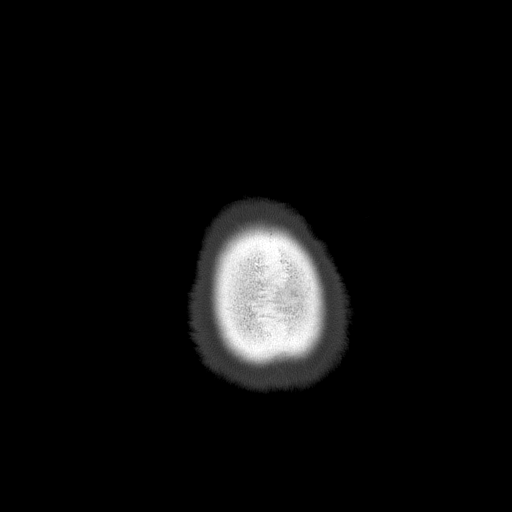

[Series 5: head 3.0 mpr cor · coronal · 0.34mm/px · 3 of 67 slices shown]
[im 23/67  brain]
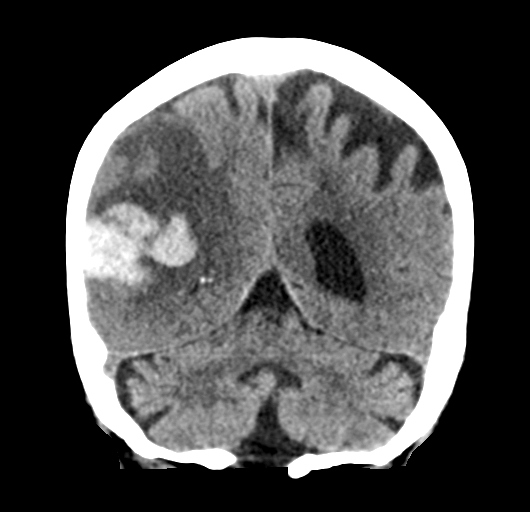
[im 30/67  brain]
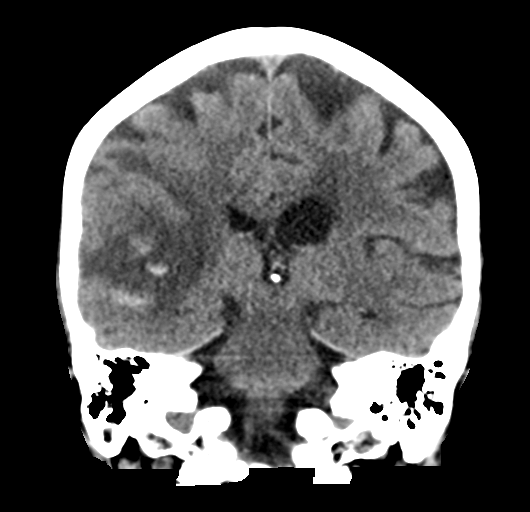
[im 37/67  brain]
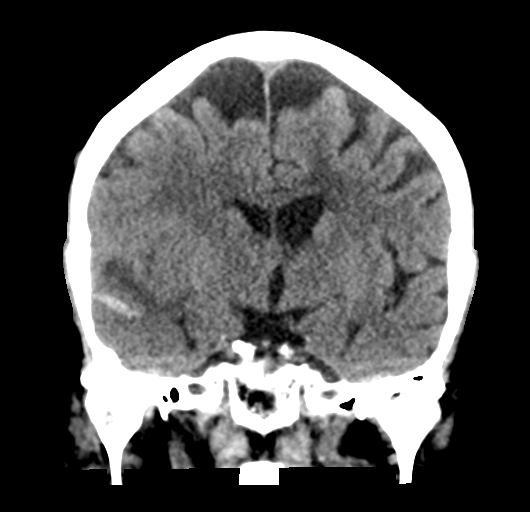

[Series 6: head 3.0 mpr sag · sagittal · 0.33mm/px · 3 of 60 slices shown]
[im 20/60  brain]
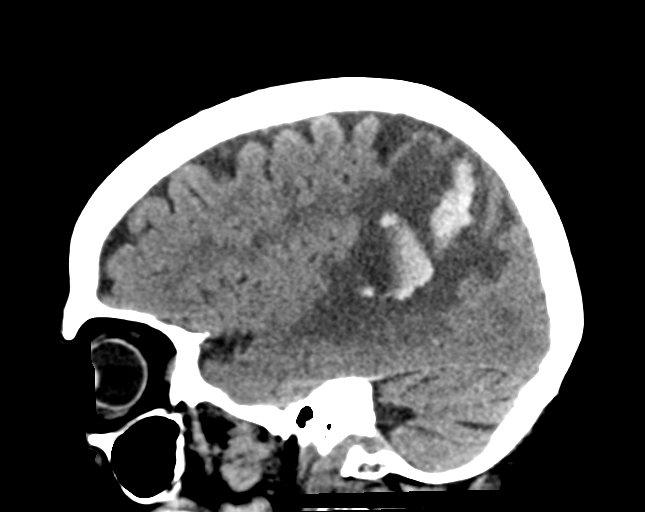
[im 30/60  brain]
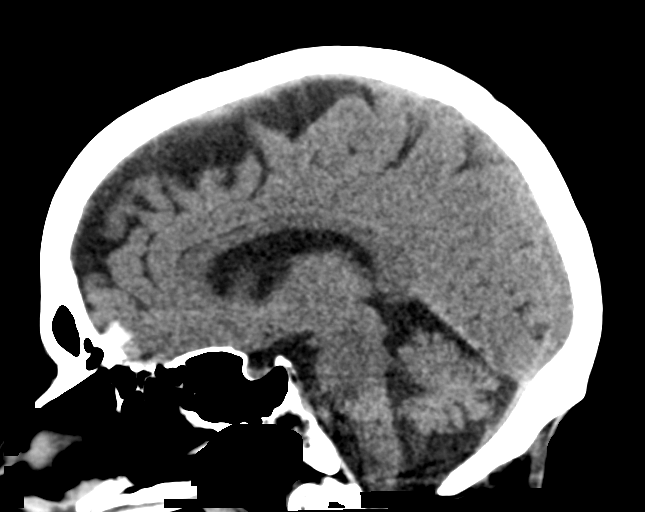
[im 40/60  brain]
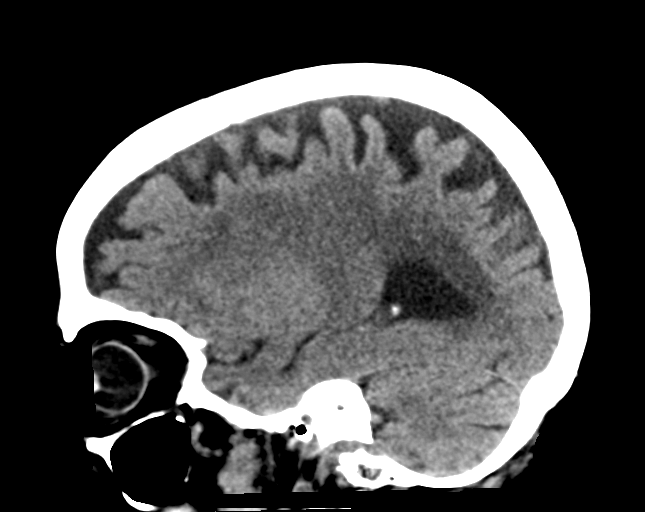

[15 of 47 positions shown; findings below may reference images not displayed]

FINDINGS: Brain:

Hemorrhage involving the right parietal and temporal lobe is not
significantly changed in size, measuring 4.0 x 3.2 x 4.4 cm. Blood
products have begun to lay are. Surrounding edema and mass effect is
similar the prior studies. Effacement of the sulci the posterior
horn of the right lateral ventricle noted. Minimal intraventricular
blood products are unchanged. Subarachnoid blood along the right
temporal lobe is also stable.

No new hemorrhage is present. One 2 mm midline shift is stable. No
significant extra-axial fluid is present on the left hemisphere.

The brainstem and cerebellum are within normal limits.

Vascular: Atherosclerotic calcifications are present within the
cavernous internal carotid arteries bilaterally. No hyperdense
vessel is present.

Skull: Calvarium is intact. No focal lytic or blastic lesions are
present. No significant extracranial soft tissue lesion is present.

Sinuses/Orbits: Fluid is present inferior right mastoid air cells.
Scratched at fluid is present the inferior mastoid air cells
bilaterally. This is increased slightly. Obstructing lesions are
present. The paranasal sinuses and mastoid air cells are otherwise
clear. The globes and orbits are within normal limits.
IMPRESSION: 1. Stable appearance of right parietal and temporal lobe hemorrhage
with surrounding edema and mass effect.
2. Stable subarachnoid blood along the right temporal lobe.
3. Stable minimal intraventricular blood products.
4. No new hemorrhage.
5. Slight increase in bilateral mastoid effusions.

## 2021-07-22 MED ORDER — REVEFENACIN 175 MCG/3ML IN SOLN
175.0000 ug | Freq: Every day | RESPIRATORY_TRACT | Status: DC
Start: 2021-07-22 — End: 2021-07-26
  Administered 2021-07-23 – 2021-07-26 (×4): 175 ug via RESPIRATORY_TRACT
  Filled 2021-07-22 (×6): qty 3

## 2021-07-22 MED ORDER — METOPROLOL TARTRATE 25 MG PO TABS
25.0000 mg | ORAL_TABLET | Freq: Two times a day (BID) | ORAL | Status: DC
  Administered 2021-07-22 – 2021-07-23 (×2): 25 mg via ORAL
  Filled 2021-07-22 (×2): qty 1

## 2021-07-22 MED ORDER — LEVOTHYROXINE SODIUM 75 MCG PO TABS
75.0000 ug | ORAL_TABLET | Freq: Every day | ORAL | Status: DC
  Administered 2021-07-23 – 2021-07-26 (×4): 75 ug via ORAL
  Filled 2021-07-22 (×4): qty 1

## 2021-07-22 MED ORDER — DEXTROSE 50 % IV SOLN
1.0000 | Freq: Once | INTRAVENOUS | Status: AC
  Administered 2021-07-22: 50 mL via INTRAVENOUS

## 2021-07-22 MED ORDER — PANTOPRAZOLE SODIUM 40 MG PO TBEC
40.0000 mg | DELAYED_RELEASE_TABLET | Freq: Every day | ORAL | Status: DC
  Administered 2021-07-22 – 2021-07-26 (×5): 40 mg via ORAL
  Filled 2021-07-22 (×5): qty 1

## 2021-07-22 MED ORDER — AMLODIPINE BESYLATE 10 MG PO TABS
10.0000 mg | ORAL_TABLET | Freq: Every day | ORAL | Status: DC
  Administered 2021-07-22 – 2021-07-23 (×2): 10 mg via ORAL
  Filled 2021-07-22 (×2): qty 1

## 2021-07-22 MED ORDER — DEXTROSE 50 % IV SOLN
INTRAVENOUS | Status: AC
Start: 2021-07-22 — End: 2021-07-22
  Filled 2021-07-22: qty 50

## 2021-07-22 MED ORDER — ARFORMOTEROL TARTRATE 15 MCG/2ML IN NEBU
15.0000 ug | INHALATION_SOLUTION | Freq: Two times a day (BID) | RESPIRATORY_TRACT | Status: DC
Start: 2021-07-22 — End: 2021-07-26
  Administered 2021-07-22 – 2021-07-26 (×8): 15 ug via RESPIRATORY_TRACT
  Filled 2021-07-22 (×8): qty 2

## 2021-07-22 MED ORDER — MAGNESIUM SULFATE 2 GM/50ML IV SOLN
2.0000 g | Freq: Once | INTRAVENOUS | Status: AC
  Administered 2021-07-22: 2 g via INTRAVENOUS
  Filled 2021-07-22: qty 50

## 2021-07-22 NOTE — Plan of Care (Signed)
?  Problem: Education: ?Goal: Knowledge of disease or condition will improve ?Outcome: Progressing ?Goal: Knowledge of secondary prevention will improve (SELECT ALL) ?Outcome: Progressing ?  ?Problem: Coping: ?Goal: Will verbalize positive feelings about self ?Outcome: Progressing ?  ?Problem: Self-Care: ?Goal: Ability to participate in self-care as condition permits will improve ?Outcome: Progressing ?  ?Problem: Intracerebral Hemorrhage Tissue Perfusion: ?Goal: Complications of Intracerebral Hemorrhage will be minimized ?Outcome: Progressing ?  ?

## 2021-07-22 NOTE — Progress Notes (Addendum)
STROKE TEAM PROGRESS NOTE   ATTENDING NOTE: I reviewed above note and agree with the assessment and plan. Pt was seen and examined.   81 year old female with history of CKD 4, anemia of chronic disease, PSVT, hypertension admitted for found down at home, confusion, generalized weakness, falling and altered mental status.  Found to have low-grade fever, tachycardia, tachypnea, elevated lactic acid and leukocytosis, AKI on CKD.  Concerning for CAP on antibiotics.  CT abdomen pelvis showed bladder distention status post Foley but UA negative.  Blood cultures so far negative.  CT head showed right temporoparietal ICH with cystic lesion and fluid level, SAH and trace IVH.  Repeat CT stable, MRI without contrast confirmed above but also with vasogenic edema.  MRA unremarkable except 5 mm right ICA cavernous aneurysm.  EF 65 to 70%.  LDL and A1c pending.  MRI with contrast will be done once creatinine improves.  On exam, niece at bedside, patient awake, alert, eyes open, orientated to age, place, time and people. No aphasia, fluent language, slight psychomotor slowing, following all simple commands. Able to name and repeat. No gaze palsy, tracking bilaterally, visual field showed left lower quadrantanopia, PERRL. No facial droop. Tongue midline. Bilateral UEs 5/5, no drift. Bilaterally LEs 5/5, no drift. Sensation symmetrical bilaterally, left FTN mild dysmetria, gait not tested.   Etiology for patient ICH not quite clear, from CT and MRI with vasogenic edema, cystic appearance with fluid level concerning for underlying malignancy.  However not able to have contrast at this time due to AKI on CKD.  We will perform MRI with contrast once creatinine improves.  BP goal less than 160, put on amlodipine and metoprolol, taper off Cleviprex as able.  Continue CAP treatment with Levaquin per CCM.  Will follow.  For detailed assessment and plan, please refer to above as I have made changes wherever appropriate.    Lauren Hawking, Lauren Conner Stroke Neurology 07/22/2021 7:36 PM  This patient is critically ill due to [place] with SAH and IVH, hypertensive urgency, cerebral edema and at significant risk of neurological worsening, death form hematoma expansion, brain herniation, hypertensive encephalopathy, seizure. This patient's care requires constant monitoring of vital signs, hemodynamics, respiratory and cardiac monitoring, review of multiple databases, neurological assessment, discussion with family, other specialists and medical decision making of high complexity. I spent 40 minutes of neurocritical care time in the care of this patient. I had long discussion with niece at bedside, updated pt current condition, treatment plan and potential prognosis, and answered all the questions.  She expressed understanding and appreciation.      INTERVAL HISTORY Her niece is at the bedside. Repeat CT stable parietal/temporal lobe hemorrhage and SAH, IVH  Vitals:   07/22/21 0400 07/22/21 0500 07/22/21 0600 07/22/21 0700  BP: 116/63 (!) 143/76 139/75 132/71  Pulse: 75 91 80 78  Resp: 17 (!) 34 (!) 21 (!) 24  Temp: 97.9 F (36.6 C)     TempSrc: Oral     SpO2: 97% 97% 98% 96%   CBC:  Recent Labs  Lab 07/21/21 0414 07/22/21 0424  WBC 11.0* 8.4  HGB 7.6* 7.9*  HCT 23.1* 24.0*  MCV 92.0 94.9  PLT 238 782   Basic Metabolic Panel:  Recent Labs  Lab 07/21/21 0414 07/21/21 1746 07/21/21 2337 07/22/21 0424  NA 133*   < > 132* 132*  K 4.1  --   --  4.3  CL 105  --   --  105  CO2 16*  --   --  18*  GLUCOSE 90  --   --  51*  BUN 47*  --   --  46*  CREATININE 2.39*  --   --  2.54*  CALCIUM 8.1*  --   --  7.9*  MG  --   --   --  1.7  PHOS  --   --   --  4.0   < > = values in this interval not displayed.   Lipid Panel: No results for input(s): CHOL, TRIG, HDL, CHOLHDL, VLDL, LDLCALC in the last 168 hours. HgbA1c: No results for input(s): HGBA1C in the last 168 hours. Urine Drug Screen: No results for input(s):  LABOPIA, COCAINSCRNUR, LABBENZ, AMPHETMU, THCU, LABBARB in the last 168 hours.  Alcohol Level No results for input(s): ETH in the last 168 hours.  IMAGING past 24 hours CT HEAD WO CONTRAST (5MM)  Result Date: 07/21/2021 CLINICAL DATA:  Tension-type headache EXAM: CT HEAD WITHOUT CONTRAST TECHNIQUE: Contiguous axial images were obtained from the base of the skull through the vertex without intravenous contrast. RADIATION DOSE REDUCTION: This exam was performed according to the departmental dose-optimization program which includes automated exposure control, adjustment of the mA and/or kV according to patient size and/or use of iterative reconstruction technique. COMPARISON:  None FINDINGS: Brain: Generalized atrophy. Mild effacement of the occipital horn and atrium of the RIGHT lateral ventricle. Remaining ventricular system normal appearance. Large area of acute intraparenchymal hemorrhage identified within the RIGHT temporoparietal lobe 4.0 x 4.0 x 4.1 cm with surrounding vasogenic edema. Additionally, small amount of subarachnoid hemorrhage is seen within sulci at the anterior RIGHT temporal lobe. No intraventricular extension of hemorrhage. 3 mm of RIGHT to LEFT midline shift. Underlying small vessel chronic ischemic changes of deep cerebral white matter. No focal mass identified. Vascular: Atherosclerotic calcification of internal carotid arteries at skull base Skull: Intact Sinuses/Orbits: Clear Other: N/A IMPRESSION: Large area of acute intraparenchymal hemorrhage within the RIGHT temporoparietal lobe 4.0 x 4.0 x 4.1 cm with surrounding vasogenic edema and mild effacement of the occipital horn and atrium of the RIGHT lateral ventricle. Small amount of subarachnoid hemorrhage within sulci at the anterior RIGHT temporal lobe. 3 mm of RIGHT to LEFT midline shift. Underlying small vessel chronic ischemic changes of deep cerebral white matter. Critical Value/emergent results were called by telephone at the  time of interpretation on 07/21/2021 at 12:56 pm to provider Laurey Arrow Lauren, who verbally acknowledged these results. Electronically Signed   By: Lavonia Dana M.D.   On: 07/21/2021 12:57   MR ANGIO HEAD WO CONTRAST  Result Date: 07/21/2021 CLINICAL DATA:  Initial evaluation for altered mental status. EXAM: MRI HEAD WITHOUT CONTRAST MRA HEAD WITHOUT CONTRAST TECHNIQUE: Multiplanar, multi-echo pulse sequences of the brain and surrounding structures were acquired without intravenous contrast. Angiographic images of the Circle of Willis were acquired using MRA technique without intravenous contrast. COMPARISON:  Prior CT from earlier the same day. FINDINGS: MRI HEAD FINDINGS Brain: Generalized age-related cerebral atrophy. Patchy and confluent T2/FLAIR hyperintensity involving the periventricular deep white matter both cerebral hemispheres as well as the pons, consistent with chronic small vessel ischemic disease, moderately advanced in nature. Small remote left cerebellar infarct noted. Previously identified intraparenchymal hematoma centered at the left temporal occipital region again seen. Hemorrhage measures approximately 3.7 x 3.7 x 3.7 cm (estimated volume 25 mL). No visible underlying mass lesion or other structural abnormality on this noncontrast examination. Associated small volume subarachnoid hemorrhage within the adjacent right temporal region. Surrounding vasogenic edema with partial  mass effect on the atrium of the right lateral ventricle and trace 3 mm right-to-left shift. No hydrocephalus or trapping. Basilar cisterns remain patent. Trace intraventricular hemorrhage noted as well, likely related to redistribution. No other evidence for acute intracranial hemorrhage. No acute or subacute infarct elsewhere within the brain. Gray-white matter differentiation otherwise maintained. No other foci of susceptibility artifact to suggest acute or chronic intracranial blood products. No mass lesion or extra-axial  fluid collection. Pituitary gland suprasellar region normal. Midline structures intact. Vascular: Major intracranial vascular flow voids are well maintained. Skull and upper cervical spine: Craniocervical junction within normal limits. Bone marrow signal intensity normal. No scalp soft tissue abnormality. Sinuses/Orbits: Globes orbital soft tissues within normal limits. Mild mucosal thickening noted within the ethmoidal air cells. Paranasal sinuses are otherwise clear. Small bilateral mastoid effusions noted. Visualized nasopharynx unremarkable. Other: None. MRA HEAD FINDINGS Anterior circulation: Examination degraded by motion artifact and positioning. Visualized distal cervical segments of the internal carotid arteries are patent with antegrade flow. Petrous segments patent bilaterally. Atheromatous irregularity seen within the carotid siphons without hemodynamically significant stenosis. Irregular 5 mm focal outpouching extending laterally from the cavernous right ICA consistent with a small aneurysm (series 19, image 57). A1 segments patent bilaterally. Normal anterior communicating artery complex. Both anterior cerebral arteries grossly patent to their distal aspects without significant stenosis. No M1 stenosis or occlusion. Normal MCA bifurcations. Distal MCA branches well perfused and fairly symmetric. Posterior circulation: Visualized distal V4 segments patent without stenosis. Right vertebral artery slightly dominant. Basilar patent to its distal aspect without stenosis. Superior cerebellar arteries patent bilaterally. Both PCAs primarily supplied via the basilar well perfused or distal aspects. Anatomic variants: None significant. No vascular abnormality seen underlying the right cerebral hemorrhage. IMPRESSION: MRI HEAD IMPRESSION: 1. 25 mL intraparenchymal hematoma centered at the right temporoccipital region. No visible underlying mass lesion or other structural abnormality on this noncontrast  examination. Associated vasogenic edema with trace 3 mm of right-to-left shift. 2. Associated small volume subarachnoid hemorrhage within the adjacent right cerebral hemisphere. Trace intraventricular hemorrhage likely related to redistribution. No hydrocephalus or trapping. 3. Underlying age-related cerebral atrophy with moderately advanced chronic microvascular ischemic disease. 4. Small remote left cerebellar infarct. MRA HEAD IMPRESSION: 1. No vascular abnormality seen underlying the right cerebral hemorrhage. Negative intracranial MRA 2. For large vessel occlusion. Mild atheromatous irregularity about the carotid siphons without hemodynamically significant or correctable stenosis. 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 Electronically Signed   By: Jeannine Boga M.D.   On: 07/21/2021 23:27   MR BRAIN WO CONTRAST  Result Date: 07/21/2021 CLINICAL DATA:  Initial evaluation for altered mental status. EXAM: MRI HEAD WITHOUT CONTRAST MRA HEAD WITHOUT CONTRAST TECHNIQUE: Multiplanar, multi-echo pulse sequences of the brain and surrounding structures were acquired without intravenous contrast. Angiographic images of the Circle of Willis were acquired using MRA technique without intravenous contrast. COMPARISON:  Prior CT from earlier the same day. FINDINGS: MRI HEAD FINDINGS Brain: Generalized age-related cerebral atrophy. Patchy and confluent T2/FLAIR hyperintensity involving the periventricular deep white matter both cerebral hemispheres as well as the pons, consistent with chronic small vessel ischemic disease, moderately advanced in nature. Small remote left cerebellar infarct noted. Previously identified intraparenchymal hematoma centered at the left temporal occipital region again seen. Hemorrhage measures approximately 3.7 x 3.7 x 3.7 cm (estimated volume 25 mL). No visible underlying mass lesion or other structural abnormality on this noncontrast examination. Associated small  volume subarachnoid hemorrhage within the adjacent right  temporal region. Surrounding vasogenic edema with partial mass effect on the atrium of the right lateral ventricle and trace 3 mm right-to-left shift. No hydrocephalus or trapping. Basilar cisterns remain patent. Trace intraventricular hemorrhage noted as well, likely related to redistribution. No other evidence for acute intracranial hemorrhage. No acute or subacute infarct elsewhere within the brain. Gray-white matter differentiation otherwise maintained. No other foci of susceptibility artifact to suggest acute or chronic intracranial blood products. No mass lesion or extra-axial fluid collection. Pituitary gland suprasellar region normal. Midline structures intact. Vascular: Major intracranial vascular flow voids are well maintained. Skull and upper cervical spine: Craniocervical junction within normal limits. Bone marrow signal intensity normal. No scalp soft tissue abnormality. Sinuses/Orbits: Globes orbital soft tissues within normal limits. Mild mucosal thickening noted within the ethmoidal air cells. Paranasal sinuses are otherwise clear. Small bilateral mastoid effusions noted. Visualized nasopharynx unremarkable. Other: None. MRA HEAD FINDINGS Anterior circulation: Examination degraded by motion artifact and positioning. Visualized distal cervical segments of the internal carotid arteries are patent with antegrade flow. Petrous segments patent bilaterally. Atheromatous irregularity seen within the carotid siphons without hemodynamically significant stenosis. Irregular 5 mm focal outpouching extending laterally from the cavernous right ICA consistent with a small aneurysm (series 19, image 57). A1 segments patent bilaterally. Normal anterior communicating artery complex. Both anterior cerebral arteries grossly patent to their distal aspects without significant stenosis. No M1 stenosis or occlusion. Normal MCA bifurcations. Distal MCA branches well  perfused and fairly symmetric. Posterior circulation: Visualized distal V4 segments patent without stenosis. Right vertebral artery slightly dominant. Basilar patent to its distal aspect without stenosis. Superior cerebellar arteries patent bilaterally. Both PCAs primarily supplied via the basilar well perfused or distal aspects. Anatomic variants: None significant. No vascular abnormality seen underlying the right cerebral hemorrhage. IMPRESSION: MRI HEAD IMPRESSION: 1. 25 mL intraparenchymal hematoma centered at the right temporoccipital region. No visible underlying mass lesion or other structural abnormality on this noncontrast examination. Associated vasogenic edema with trace 3 mm of right-to-left shift. 2. Associated small volume subarachnoid hemorrhage within the adjacent right cerebral hemisphere. Trace intraventricular hemorrhage likely related to redistribution. No hydrocephalus or trapping. 3. Underlying age-related cerebral atrophy with moderately advanced chronic microvascular ischemic disease. 4. Small remote left cerebellar infarct. MRA HEAD IMPRESSION: 1. No vascular abnormality seen underlying the right cerebral hemorrhage. Negative intracranial MRA 2. For large vessel occlusion. Mild atheromatous irregularity about the carotid siphons without hemodynamically significant or correctable stenosis. 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 Electronically Signed   By: Jeannine Boga M.D.   On: 07/21/2021 23:27    PHYSICAL EXAM  Physical Exam  Constitutional: Appears well-developed and well-nourished. Cardiovascular: Normal rate and regular rhythm.  Respiratory: Effort normal, non-labored breathing  Neuro: Mental Status: Patient is awake, alert, oriented to person, place, month, year, and situation. Psychomotor slowing No signs of aphasia or neglect Cranial Nerves: II: Pupils are equal, round, and reactive to light.  Left lower quadrantanopia.  III,IV, VI:  EOMI without ptosis or diploplia.  V: Facial sensation is symmetric to temperature VII: Facial movement is symmetric resting and smiling VIII: Hearing is intact to voice X: Palate elevates symmetrically XI: Shoulder shrug is symmetric. XII: Tongue protrudes midline without atrophy or fasciculations.  Motor: Tone is normal. Bulk is normal. 5/5 strength was present in all four extremities.  Sensory: Sensation is symmetric to light touch and temperature in the arms and legs. No extinction to DSS present.  Cerebellar: FNF left side mild dysmetria  ASSESSMENT/PLAN Lauren Conner is a 81 y.o. female with history of CKD4, anemia, paroxysmal SVT, hypothyroidism, bronchiectasis, HTN presenting initially to [facility] with weakness, fever, tachycardia, BP 154/112, and confusion. Head CT then revealed a right parietal lobe ICH with mass effect and an adjacent subarachnoid hemorrhage.   ICH:  Right parietal lobe ICH with adjacent SAH and trace IVH, etiology unclear, concerning for mass Code Stroke Large area of acute ICH within the RIGHT temporoparietal lobe 4.0 x 4.0 x 4.1 cm with surrounding vasogenic edema and mild effacement of the occipital horn and atrium of the RIGHT lateral ventricle. Small amount of subarachnoid hemorrhage within sulci at the anterior RIGHT temporal lobe. 3 mm of RIGHT to LEFT midline shift Repeat CT- Stable appearance of right parietal and temporal lobe hemorrhage, SAH, and IVH with surrounding edema and mass effect. MRI  25 mL intraparenchymal hematoma centered at the right temporoccipital region. No visible underlying mass lesion or other structural abnormality on this noncontrast examination. Associated vasogenic edema with trace 3 mm of right-to-left shift. Associated small volume subarachnoid hemorrhage within the adjacent right cerebral hemisphere. Trace intraventricular hemorrhage likely related to redistribution. Small remote left cerebellar infarct. Not able to perform MRI or  CT with contrast given elevated Cre, will wait improvement of Cre  MRA   Mild atheromatous irregularity about the carotid siphons without hemodynamically significant or correctable stenosis. 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 2D Echo EF 65-70%, lt and rt atria normal in size VTE prophylaxis - SCDs Therapy recommendations:  pending Disposition:  pending  Hypertension Home meds:  None Stable BP under 160  CKD stage 4 management by primary team Creatinine 2.13->2.07->2.39->2.54 On IV fluid  Other Stroke Risk Factors Advanced Age >/= 51  PSVT  Other Active Problems Hypothyroidism- resume synthroid CAP with baseline bronchiectasis Levaquin- managed by primary team Urinary retention- management by primary team Foley catheter placed Urine cx negative Leukocytosis improved  Hospital day # 1  Patient seen and examined by NP/APP with Lauren. Lauren to update note as needed.   Lauren Ores, DNP, FNP-BC [facility] Pager: [phone]    To contact Stroke Continuity provider, please refer to [email or web address] After hours, contact General Neurology

## 2021-07-22 NOTE — Progress Notes (Signed)
PT Cancellation Note ? ?Patient Details ?Name: Lauren Conner ?MRN: [id number] ?DOB: 1940/07/15 ? ? ?Cancelled Treatment:    Reason Eval/Treat Not Completed: Active bedrest order (per ICH protocol). Will check back when pt off bedrest ? ?Leighton Roach, PT  ?Acute Rehab Services ? Pager [phone] ?Office [phone] ? ? ? ?[place] ?07/22/2021, 8:20 AM ?

## 2021-07-22 NOTE — Evaluation (Signed)
Clinical/Bedside Swallow Evaluation ?Patient Details  ?Name: Lauren Conner ?MRN: [id number] ?Date of Birth: [birth year] ? ?Today's Date: 07/22/2021 ?Time: SLP Start Time (ACUTE ONLY): [id number] SLP Stop Time (ACUTE ONLY): 0930 ?SLP Time Calculation (min) (ACUTE ONLY): 13 min ? ?Past Medical History: No past medical history on file. ?Past Surgical History: No past surgical history on file. ?HPI:  ?81 yo F with end-stage renal failure with CKD, cardiac dysfunction with arrhythmia and SVT, hypothyroidism, moderate to severe protein calorie malnutrition, chronic bronchiectasis, She was admitted with altered mental status and confusion. She was found to have leukocytosis with elevated lactate and metabolic acidosis. Chest CT with cylindrical and saccular bronchiectatic changes with chronic bronchitic airways bilaterally with mucoid impaction and bibasilar fibrotic changes and large hiatal hernia. MRI 25 mL intraparenchymal hematoma centered at the right temporoccipital region. associated small volume subarachnoid hemorrhage within the adjacent right cerebral hemisphere, underlying age-related cerebral atrophy with moderately advanced  chronic microvascular ischemic disease, small remote left cerebellar infarct  ?  ?Assessment / Plan / Recommendation  ?Clinical Impression ? Pt's overall status has improved from yesterdays assessment at [facility]. She is mildly anxious but following commands and conversive with niece at bedside. Left lingual asymmetry and lingual candidias noted on oral-motor exam. She has lower partial and upper denture/partial (?). On CT she was found to have large hiatal hernia with eructation noted today. There was mild throat clear after thin liquids x 2 mildly concerning for airway intrusion or result of hiatal hernia. It is recommended she initiate regular/thin with assistance as she has left neglect and ST will continue to follow for appropriateness of recommendations and potential need for instrumental study.  Pills one at a time with thin and remain upright 30 min afte meals. ?SLP Visit Diagnosis: Dysphagia, unspecified (R13.10) ?   ?Aspiration Risk ? Mild aspiration risk;Moderate aspiration risk  ?  ?Diet Recommendation Regular;Thin liquid  ? ?Liquid Administration via: Straw;Cup ?Medication Administration: Whole meds with liquid ?Supervision: Staff to assist with self feeding;Full supervision/cueing for compensatory strategies ?Compensations: Minimize environmental distractions;Slow rate;Small sips/bites ?Postural Changes: Seated upright at 90 degrees  ?  ?Other  Recommendations Oral Care Recommendations: Oral care BID   ? ?Recommendations for follow up therapy are one component of a multi-disciplinary discharge planning process, led by the attending physician.  Recommendations may be updated based on patient status, additional functional criteria and insurance authorization. ? ?Follow up Recommendations Acute inpatient rehab (3hours/day)  ? ? ?  ?Assistance Recommended at Discharge Frequent or constant Supervision/Assistance  ?Functional Status Assessment Patient has had a recent decline in their functional status and demonstrates the ability to make significant improvements in function in a reasonable and predictable amount of time.  ?Frequency and Duration min 2x/week  ?2 weeks ?  ?   ? ?Prognosis Prognosis for Safe Diet Advancement: Good  ? ?  ? ?Swallow Study   ?General Date of Onset: 07/21/21 ?HPI: 81 yo F with end-stage renal failure with CKD, cardiac dysfunction with arrhythmia and SVT, hypothyroidism, moderate to severe protein calorie malnutrition, chronic bronchiectasis, She was admitted with altered mental status and confusion. She was found to have leukocytosis with elevated lactate and metabolic acidosis. Chest CT with cylindrical and saccular bronchiectatic changes with chronic bronchitic airways bilaterally with mucoid impaction and bibasilar fibrotic changes and large hiatal hernia. MRI 25 mL  intraparenchymal hematoma centered at the right temporoccipital region. associated small volume subarachnoid hemorrhage within the adjacent right cerebral hemisphere, underlying age-related cerebral atrophy with moderately advanced  chronic microvascular ischemic disease, small remote left cerebellar infarct ?Type of Study: Bedside Swallow Evaluation ?Previous Swallow Assessment: yes (recommended full liq's due to dec mentation) ?Diet Prior to this Study: NPO ?Temperature Spikes Noted: No ?Respiratory Status: Room air ?History of Recent Intubation: No ?Behavior/Cognition: Alert;Cooperative;Pleasant mood;Other (Comment) (anxious) ?Oral Cavity Assessment: Other (comment) (lingual candidias) ?Oral Care Completed by SLP: No ?Oral Cavity - Dentition: Adequate natural dentition ?Vision: Functional for self-feeding ?Self-Feeding Abilities: Able to feed self;Needs assist ?Patient Positioning: Upright in bed ?Baseline Vocal Quality: Normal ?Volitional Cough: Strong ?Volitional Swallow: Able to elicit  ?  ?Oral/Motor/Sensory Function Overall Oral Motor/Sensory Function: Mild impairment ?Facial ROM: Within Functional Limits ?Facial Symmetry: Within Functional Limits ?Facial Strength: Within Functional Limits ?Lingual ROM: Reduced right;Reduced left ?Lingual Symmetry: Abnormal symmetry left   ?Ice Chips Ice chips: Not tested   ?Thin Liquid Thin Liquid: Impaired ?Presentation: Straw ?Pharyngeal  Phase Impairments: Throat Clearing - Delayed  ?  ?Nectar Thick Nectar Thick Liquid: Not tested   ?Honey Thick Honey Thick Liquid: Not tested   ?Puree Puree: Within functional limits   ?Solid ? ? ?  Solid: Within functional limits  ? ?  ? ?[name] ?07/22/2021,9:59 AM ? ? ? ?

## 2021-07-22 NOTE — Evaluation (Signed)
Speech Language Pathology Evaluation ?Patient Details ?Name: RUVI FULLENWIDER ?MRN: [id number] ?DOB: [birth year] ?Today's Date: 07/22/2021 ?Time: 0920-0930 ?SLP Time Calculation (min) (ACUTE ONLY): 10 min ? ?Problem List:  ?Patient Active Problem List  ? Diagnosis Date Noted  ? Bronchiectasis with acute exacerbation ([place])   ? Encephalopathy acute 07/21/2021  ? Intracerebral hemorrhage 07/21/2021  ? Acute renal failure ([place])   ? Acute urinary retention 07/20/2021  ? Bilateral hydronephrosis 07/20/2021  ? Osteoporosis without current pathological fracture 07/20/2021  ? CKD (chronic kidney disease) stage 4, GFR 15-29 ml/min (H[facility]) 07/19/2021  ? Paroxysmal SVT (supraventricular tachycardia) ([place]) 07/19/2021  ? Severe sepsis ([place]) 07/19/2021  ? Pneumonia of both lungs due to infectious organism 07/19/2021  ? Acute metabolic encephalopathy 33/29/5188  ? Anemia of chronic kidney failure, stage 4 (severe) ([place]) 07/19/2021  ? Metabolic acidosis 41/66/0630  ? Hyponatremia 07/19/2021  ? Essential hypertension 11/17/2018  ? Hypothyroidism (acquired) 12/06/2015  ? Adult bronchiectasis ([place]) 03/25/2011  ? ?Past Medical History: No past medical history on file. ?Past Surgical History: No past surgical history on file. ?HPI:  ?81 yo F with end-stage renal failure with CKD, cardiac dysfunction with arrhythmia and SVT, hypothyroidism, moderate to severe protein calorie malnutrition, chronic bronchiectasis, She was admitted with altered mental status and confusion. She was found to have leukocytosis with elevated lactate and metabolic acidosis. Chest CT with cylindrical and saccular bronchiectatic changes with chronic bronchitic airways bilaterally with mucoid impaction and bibasilar fibrotic changes and large hiatal hernia. MRI 25 mL intraparenchymal hematoma centered at the right temporoccipital region. associated small volume subarachnoid hemorrhage within the adjacent right cerebral hemisphere, underlying age-related cerebral atrophy with  moderately advanced  chronic microvascular ischemic disease, small remote left cerebellar infarct  ? ?Assessment / Plan / Recommendation ?Clinical Impression ? Pt demonstrated mild anxiety during assessment which niece reports as new and is easily redirected to task. Areas of concern are selective attention, memory (need to be further assessed), awareness and mild-moderate problem solving. Prior to admission she lived alone with frequent phone check in from sons and niece. Ms. Steines neglected her left side not seeing items and people on her left side. Further ST is recommended and possibly inpatient rehab. ?   ?SLP Assessment ? SLP Recommendation/Assessment: Patient needs continued [place] [facility] ?SLP Visit Diagnosis: Cognitive communication deficit (R41.841)  ?  ?Recommendations for follow up therapy are one component of a multi-disciplinary discharge planning process, led by the attending physician.  Recommendations may be updated based on patient status, additional functional criteria and insurance authorization. ?   ?Follow Up Recommendations ? Acute inpatient rehab (3hours/day)  ?  ?Assistance Recommended at Discharge ? Frequent or constant Supervision/Assistance  ?Functional Status Assessment Patient has had a recent decline in their functional status and demonstrates the ability to make significant improvements in function in a reasonable and predictable amount of time.  ?Frequency and Duration min 2x/week  ?2 weeks ?  ?   ?SLP Evaluation ?Cognition ? Overall Cognitive Status: Impaired/Different from baseline ?Arousal/Alertness: Awake/alert ?Orientation Level: Oriented to person;Oriented to place;Disoriented to time;Disoriented to situation ?Year:  (initially 2013, self corrected) ?Month: March ?Day of Week: Incorrect ?Attention: Sustained ?Sustained Attention: Appears intact (suspect she will breakdown in function) ?Memory:  (TBA) ?Awareness: Impaired ?Awareness Impairment: Intellectual  impairment;Anticipatory impairment ?Problem Solving:  (very basic intact) ?Safety/Judgment: Impaired  ?  ?   ?Comprehension ? Auditory Comprehension ?Overall Auditory Comprehension: Appears within functional limits for tasks assessed ?Visual Recognition/Discrimination ?Discrimination: Not tested ?Reading Comprehension ?Reading  Status: Not tested  ?  ?Expression Expression ?Primary Mode of Expression: Verbal ?Verbal Expression ?Overall Verbal Expression: Appears within functional limits for tasks assessed ?Naming: Impairment ?Divergent:  (7 animals) ?Pragmatics: No impairment ?Written Expression ?Dominant Hand: Right ?Written Expression: Not tested   ?Oral / Motor ? Oral Motor/Sensory Function ?Overall Oral Motor/Sensory Function: Mild impairment ?Facial ROM: Within Functional Limits ?Facial Symmetry: Within Functional Limits ?Facial Strength: Within Functional Limits ?Lingual ROM: Reduced right;Reduced left ?Lingual Symmetry: Abnormal symmetry left ?Motor Speech ?Overall Motor Speech: Appears within functional limits for tasks assessed ?Respiration: Within functional limits ?Intelligibility: Intelligible ?Motor Planning: Witnin functional limits   ?        ? ?[name] ?07/22/2021, 10:08 AM ?Cranford Mon.Ed CCC-SLP ?Speech-Language Pathologist ?Pager [phone] ?Office [phone] ? ?

## 2021-07-22 NOTE — Progress Notes (Signed)
?  Transition of Care (TOC) Screening Note ? ? ?Patient Details  ?Name: Lauren Conner ?Date of Birth: [birth year] ? ? ?Transition of Care (TOC) CM/SW Contact:    ?Benard Halsted, LCSW ?Phone Number: ?07/22/2021, 9:52 AM ? ? ? ?Transition of Care Department [facility]) has reviewed patient and no TOC needs have been identified at this time. We will continue to monitor patient advancement through interdisciplinary progression rounds. If new patient transition needs arise, please place a TOC consult. ? ? ?

## 2021-07-22 NOTE — Progress Notes (Signed)
NAME:  Lauren Conner, MRN:  [id number], DOB:  12/30/1940, LOS: 1 ADMISSION DATE:  07/21/2021, CONSULTATION DATE:  07/22/21 REFERRING MD:  [facility], CHIEF COMPLAINT:  Fall, ICH   History of Present Illness:  Lauren Conner is a 81 y.o. F with PMH significant for SVT, HTN, bronchiectasis with MAI infection followed at [facility] and completed 63yrs Azith and Rifampin, CKD 4 who initially presented to [facility] after a fall with altered mental status.  Her family reports that she is normally mobile and very independent.  Family found her confused on the floor with bruising to the face, so brought her into the ED.  She was encephalopathic and febrile, CXR with multi-focal opacities.   She was admitted, but remained encephalopathic so CTH was obtained which showed large R tempoparietal IPH with 50mm midline shift and vasogenic edema.   She was transferred from [facility] to [facility] for further management.   Family reports recent syncopal episodes over the last few months, she was seen by cardiology and holter monitor was negative, however she continued to have these episodes periodically   Pertinent  Medical History  SVT, HTN, bronchiectasis with MAI infection, CKD 4  Significant Hospital Events: Including procedures, antibiotic start and stop dates in addition to other pertinent events   3/5 presented to [facility], admitted to hospitalists 3/6 txfr to [facility] for 3% after [place] showed large IPH with edema and midline shift 3/7 Looks better this morning, awake and passed swallow    Significant studies:  3/6 CTH: Large area of acute intraparenchymal hemorrhage within the RIGHT temporoparietal lobe 4.0 x 4.0 x 4.1 cm with surrounding vasogenic edema and mild effacement of the occipital horn and atrium of the RIGHT lateral ventricle.  Small amount of subarachnoid hemorrhage within sulci at the anterior RIGHT temporal lobe.  3 mm of RIGHT to LEFT midline shift.  3/5 CT abd/pelvis IMPRESSION: Marked severity bibasilar multifocal  infiltrates. Marked severity bilateral hydronephrosis and proximal hydroureter in the setting of a markedly distended urinary bladder.   3/7 MRI/MRA  1. 25 mL intraparenchymal hematoma centered at the right temporoccipital region. No visible underlying mass lesion or other structural abnormality on this noncontrast examination. Associated vasogenic edema with trace 3 mm of right-to-left shift. 2. Associated small volume subarachnoid hemorrhage within the adjacent right cerebral hemisphere. Trace intraventricular hemorrhage likely related to redistribution. No hydrocephalus or trapping.  Interim History / Subjective:   No overnight events, looks good this morning, wants a Pepsi  Objective   Blood pressure 137/68, pulse 93, temperature 97.6 F (36.4 C), temperature source Oral, resp. rate (!) 30, SpO2 96 %.        Intake/Output Summary (Last 24 hours) at 07/22/2021 9509 Last data filed at 07/22/2021 0800 Gross per 24 hour  Intake 537.91 ml  Output 600 ml  Net -62.09 ml    There were no vitals filed for this visit.  General:  thin, elderly F, resting in bed in no acute distress HEENT: MM pink/moist, pupils equal, sclera anicteric  Neuro: awake, oriented x3, moving all extremities to command, slightly weaker on the L, no facial droop and clear speech CV: s1s2 rrr, no m/r/g PULM:  slightly decreased air entry bilateral bases, RLL rhonchi no wheezing GI: soft, bsx4 active  Extremities: warm/dry, no edema  Skin: no rashes or lesions  WBC improved to 8.4 Creatinine slightly up to 2.5, 600cc UOP Hgb 7.9   Resolved Hospital Problem list     Assessment & Plan:   Acute R  tempoparietal IPH in the setting of AMS and fall Possibly hypertensive though may be traumatic, though spoke with neurology and the extent of vasogenic edema may possibly suggest underlying mass -MRI/MRA done without contrast and do not suggest underlying vascular abnormality -continue serial neuro  checks -repeat head CT this afternoon -hold off 3% per neuro, start if clinically worsening  -currently protecting her airway, family would want intubation if indicated  -SBP goal 130-150 -supportive care, normothermia, euglycemia -appreciate Neurology recomendations -cardene as needed -can transfer out of ICU if repeat head CT is stable     Possible CAP Baseline Bronchiectasis not currently on chronic antibiotics and followed at [facility]  -continue Levaquin x14 days for likely CAP with possible bronchiectasis exacerbation -extended RVP negative, BC with NGTD -Maretta Bees and Brovana nebs -recommend stop outpatient inhaled steroid in the setting of bronchiectasis and Stiolto instead    Acute on Chronic renal failure, baseline CKD 4 Creatinine slightly up from baseline, 600cc UOP -continue to monitor UOP, renal indices and electrolytes     Acute Urinary Retention with hydronephrosis  -continue foley, UC negative   Hypothyroidism  -resume Synthroid     Best Practice (right click and "Reselect all SmartList Selections" daily)   Diet/type: swallow screen pending and advance as tolerated DVT prophylaxis: SCD GI prophylaxis: N/A Lines: N/A Foley:  Yes, and it is still needed Code Status:  full code Last date of multidisciplinary goals of care discussion [3/6 Full code and full scope of care per discussions with Pt's son ERIC who is POA]  Labs   CBC: Recent Labs  Lab 07/19/21 2120 07/20/21 0745 07/21/21 0414 07/22/21 0424  WBC 17.1* 13.6* 11.0* 8.4  HGB 10.2* 9.1* 7.6* 7.9*  HCT 31.8* 27.5* 23.1* 24.0*  MCV 95.5 93.5 92.0 94.9  PLT 343 273 238 193     Basic Metabolic Panel: Recent Labs  Lab 07/19/21 2200 07/20/21 0745 07/21/21 0414 07/21/21 1746 07/21/21 2337 07/22/21 0424  NA 126* 129* 133* 130* 132* 132*  K 4.7 4.5 4.1  --   --  4.3  CL 97* 102 105  --   --  105  CO2 17* 16* 16*  --   --  18*  GLUCOSE 157* 116* 90  --   --  51*  BUN 41* 39* 47*  --   --   46*  CREATININE 2.13* 2.07* 2.39*  --   --  2.54*  CALCIUM 8.9 8.3* 8.1*  --   --  7.9*  MG  --   --   --   --   --  1.7  PHOS  --   --   --   --   --  4.0    GFR: Estimated Creatinine Clearance: 12.8 mL/min (A) (by C-G formula based on SCr of 2.54 mg/dL (H)). Recent Labs  Lab 07/19/21 2120 07/19/21 2125 07/20/21 0016 07/20/21 0745 07/20/21 1328 07/21/21 0414 07/22/21 0424  PROCALCITON  --   --   --  0.32  --  0.51  --   WBC 17.1*  --   --  13.6*  --  11.0* 8.4  LATICACIDVEN  --  3.5* 2.3*  --  1.6  --   --      Liver Function Tests: Recent Labs  Lab 07/19/21 2200 07/21/21 0414  AST 35 20  ALT 15 12  ALKPHOS 68 53  BILITOT 0.7 0.6  PROT 7.9 6.0*  ALBUMIN 3.1* 2.4*    No results for input(s): LIPASE, AMYLASE  in the last 168 hours. No results for input(s): AMMONIA in the last 168 hours.  ABG No results found for: PHART, PCO2ART, PO2ART, HCO3, TCO2, ACIDBASEDEF, O2SAT   Coagulation Profile: Recent Labs  Lab 07/20/21 0016  INR 1.2     Cardiac Enzymes: No results for input(s): CKTOTAL, CKMB, CKMBINDEX, TROPONINI in the last 168 hours.  HbA1C: No results found for: HGBA1C  CBG: Recent Labs  Lab 07/22/21 0746 07/22/21 0836  GLUCAP 44* 111*    Review of Systems:   Unable to obtain secondary to mental status  Past Medical History:  She,  has no past medical history on file.   Surgical History:  No past surgical history on file.   Social History:      Family History:  Her family history is not on file.   Allergies Allergies  Allergen Reactions   Ethambutol Other (See Comments)    Patient reports cramping   Iodinated Contrast Media Rash     Home Medications  Prior to Admission medications   Medication Sig Start Date End Date Taking? Authorizing Provider  albuterol (PROVENTIL) (2.5 MG/3ML) 0.083% nebulizer solution Take 2.5 mg by nebulization every 6 (six) hours as needed for wheezing.    [provider]  albuterol (VENTOLIN  HFA) 108 (90 Base) MCG/ACT inhaler Inhale 2 puffs into the lungs every 4 (four) hours as needed for wheezing.    [provider]  fluticasone-salmeterol (ADVAIR DISKUS) 250-50 MCG/ACT AEPB Inhale 1 puff into the lungs every 12 (twelve) hours.    [provider]  levofloxacin (LEVAQUIN) 500 MG/100ML SOLN Inject 100 mLs (500 mg total) into the vein every other day. 07/22/21   Wouk, Ailene Rud, MD  levothyroxine (SYNTHROID) 75 MCG tablet Take 75 mcg by mouth daily. 07/08/21   [provider]  niCARdipine in saline (CARDENE-IV) 20-0.86 MG/200ML-% SOLN Inject 3-15 mg/hr into the vein continuous. 07/21/21   Wouk, Ailene Rud, MD     Critical care time: 38 minutes     CRITICAL CARE Performed by: Otilio Carpen Ohana Birdwell   Total critical care time: 38 minutes  Critical care time was exclusive of separately billable procedures and treating other patients.  Critical care was necessary to treat or prevent imminent or life-threatening deterioration.  Critical care was time spent personally by me on the following activities: development of treatment plan with patient and/or surrogate as well as nursing, discussions with consultants, evaluation of patient's response to treatment, examination of patient, obtaining history from patient or surrogate, ordering and performing treatments and interventions, ordering and review of laboratory studies, ordering and review of radiographic studies, pulse oximetry and re-evaluation of patient's condition.   Otilio Carpen Adewale Pucillo, PA-C [facility] See Amion for pager If no response to pager , please call [phone] until 7pm After 7:00 pm call Elink  [phone]?[place]

## 2021-07-22 NOTE — Progress Notes (Signed)
OT Cancellation Note ? ?Patient Details ?Name: MONTIA HASLIP ?MRN: [id number] ?DOB: 1940-07-17 ? ? ?Cancelled Treatment:    Reason Eval/Treat Not Completed: Active bedrest order. Will assess when activity orders updated. thanks ? ?Refugia Laneve,HILLARY ?[facility], OT/L  ? ?Acute OT Clinical Specialist ?Acute Rehabilitation Services ?Pager [phone] ?Office [phone]  ?07/22/2021, 8:24 AM ?

## 2021-07-22 NOTE — Progress Notes (Signed)
Inpatient Diabetes Program Recommendations ? ?AACE/ADA: New Consensus Statement on Inpatient Glycemic Control (2015) ? ?Target Ranges:  Prepandial:   less than 140 mg/dL ?     Peak postprandial:   less than 180 mg/dL (1-2 hours) ?     Critically ill patients:  140 - 180 mg/dL  ? ?Lab Results  ?Component Value Date  ? GLUCAP 111 (H) 07/22/2021  ? ? ?Review of Glycemic Control ? Latest Reference Range & Units 07/22/21 07:46 07/22/21 08:36  ?Glucose-Capillary 70 - 99 mg/dL 44 (LL) 111 (H)  ?(LL): Data is critically low ?(H): Data is abnormally high ? ?Current orders for Inpatient glycemic control: none ? ?Inpatient Diabetes Program Recommendations:   ? ?Noted hypoglycemia this am of 44 mg/dL. May want to consider adding CBGs TID & HS.  ? ?Thanks, ?Bronson Curb, MSN, RNC-OB ?Diabetes Coordinator ?[phone] (8a-5p) ? ? ? ? ?

## 2021-07-23 DIAGNOSIS — J471 Bronchiectasis with (acute) exacerbation: Secondary | ICD-10-CM

## 2021-07-23 DIAGNOSIS — R338 Other retention of urine: Secondary | ICD-10-CM

## 2021-07-23 DIAGNOSIS — N1330 Unspecified hydronephrosis: Secondary | ICD-10-CM

## 2021-07-23 LAB — LIPID PANEL
Cholesterol: 157 mg/dL (ref 0–200)
HDL: 45 mg/dL (ref >40)
LDL Cholesterol: 94 mg/dL (ref 0–99)
Total CHOL/HDL Ratio: 3.5 RATIO
Triglycerides: 90 mg/dL (ref <150)
VLDL: 18 mg/dL (ref 0–40)

## 2021-07-23 LAB — BASIC METABOLIC PANEL
Anion gap: 10 (ref 5–15)
BUN: 42 mg/dL — ABNORMAL HIGH (ref 8–23)
CO2: 18 mmol/L — ABNORMAL LOW (ref 22–32)
Calcium: 8.2 mg/dL — ABNORMAL LOW (ref 8.9–10.3)
Chloride: 105 mmol/L (ref 98–111)
Creatinine, Ser: 2.59 mg/dL — ABNORMAL HIGH (ref 0.44–1.00)
GFR, Estimated: 18 mL/min — ABNORMAL LOW (ref >60)
Glucose, Bld: 95 mg/dL (ref 70–99)
Potassium: 4 mmol/L (ref 3.5–5.1)
Sodium: 133 mmol/L — ABNORMAL LOW (ref 135–145)

## 2021-07-23 LAB — CBC
HCT: 27.8 % — ABNORMAL LOW (ref 36.0–46.0)
Hemoglobin: 9 g/dL — ABNORMAL LOW (ref 12.0–15.0)
MCH: 31.1 pg (ref 26.0–34.0)
MCHC: 32.4 g/dL (ref 30.0–36.0)
MCV: 96.2 fL (ref 80.0–100.0)
Platelets: 241 10*3/uL (ref 150–400)
RBC: 2.89 MIL/uL — ABNORMAL LOW (ref 3.87–5.11)
RDW: 13.7 % (ref 11.5–15.5)
WBC: 9.3 10*3/uL (ref 4.0–10.5)
nRBC: 0 % (ref 0.0–0.2)

## 2021-07-23 LAB — HEMOGLOBIN A1C
Hgb A1c MFr Bld: 5.1 % (ref 4.8–5.6)
Mean Plasma Glucose: 99.67 mg/dL

## 2021-07-23 LAB — GLUCOSE, CAPILLARY: Glucose-Capillary: 96 mg/dL (ref 70–99)

## 2021-07-23 MED ORDER — LEVOFLOXACIN 500 MG PO TABS
500.0000 mg | ORAL_TABLET | ORAL | Status: DC
  Administered 2021-07-23 – 2021-07-25 (×2): 500 mg via ORAL
  Filled 2021-07-23 (×2): qty 1

## 2021-07-23 MED ORDER — CHLORHEXIDINE GLUCONATE CLOTH 2 % EX PADS
6.0000 | MEDICATED_PAD | Freq: Every day | CUTANEOUS | Status: DC
  Administered 2021-07-23 – 2021-07-26 (×4): 6 via TOPICAL

## 2021-07-23 MED ORDER — SODIUM BICARBONATE 650 MG PO TABS
650.0000 mg | ORAL_TABLET | Freq: Three times a day (TID) | ORAL | Status: DC
  Administered 2021-07-23 – 2021-07-26 (×10): 650 mg via ORAL
  Filled 2021-07-23 (×10): qty 1

## 2021-07-23 MED ORDER — TAMSULOSIN HCL 0.4 MG PO CAPS
0.4000 mg | ORAL_CAPSULE | Freq: Every day | ORAL | Status: DC
  Administered 2021-07-23 – 2021-07-26 (×4): 0.4 mg via ORAL
  Filled 2021-07-23 (×4): qty 1

## 2021-07-23 MED ORDER — SODIUM CHLORIDE 0.9 % IV SOLN: INTRAVENOUS | Status: AC

## 2021-07-23 NOTE — Evaluation (Addendum)
Physical Therapy Evaluation ?Patient Details ?Name: Lauren Conner ?MRN: [id number] ?DOB: 02/10/1941 ?Today's Date: 07/23/2021 ? ?History of Present Illness ? 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  ?Clinical Impression ? Pt admitted with above. Pt was indep and living alone PTA. Pt now presenting with L sided neglect, impaired processing and motor planning, tangential, easily distracted although orientedx4, and is requiring modA for mobility. Pt did demo some carry over of acknowledgement of L side and would begin turning head to the L and following with her eyes with cues. Pt's OOB mobility limited by orthostatic BP today. See below. Suspect once orthostatic BP issues addressed pt with progress well. Pt to benefit from aggressive therapy for maximal functional recovery. Son plans to move in with patient upon d/c. Acute PT to cont to follow. ? ?Supine 114/66 ?Sit s/p 1 min 108/67 ?Sit s/p 5 min 82/54 with onset of severe dizziness ?Returned to supine 96/59 ?In chair position with HOB at 50 deg 94/70   ?   ? ?Recommendations for follow up therapy are one component of a multi-disciplinary discharge planning process, led by the attending physician.  Recommendations may be updated based on patient status, additional functional criteria and insurance authorization. ? ?Follow Up Recommendations Acute inpatient rehab (3hours/day) ? ?  ?Assistance Recommended at Discharge Frequent or constant Supervision/Assistance  ?Patient can return home with the following ? A lot of help with walking and/or transfers;A lot of help with bathing/dressing/bathroom;Direct supervision/assist for medications management;Direct supervision/assist for financial management;Assist for transportation;Help with stairs or ramp for entrance;Assistance with cooking/housework ? ?  ?Equipment Recommendations  Rolling walker (2 wheels);BSC/3in1  ?Recommendations for Other Services ? Rehab consult  ?  ?Functional Status Assessment Patient has had a recent decline in their functional status and demonstrates the ability to make significant improvements in function in a reasonable and predictable amount of time.  ? ?  ?Precautions / Restrictions Precautions ?Precautions: Fall;Other (comment) ?Precaution Comments: orthostatic BP, L neglect ?Restrictions ?Weight Bearing Restrictions: No  ? ?  ? ?Mobility ? Bed Mobility ?Overal bed mobility: Needs Assistance ?Bed Mobility: Rolling, Sidelying to Sit, Sit to Supine ?Rolling: Min assist ?Sidelying to sit: Mod assist ?  ?Sit to supine: Max assist (due to pt becoming orthostatic) ?  ?General bed mobility comments: pt requiring minA and max verbal and tactile cues when rolling to the L as pt with L neglect and difficulty finding the rail, modA for trunk elevation due to inability to push up with L UE due to inattention requiring max tactile cues, maxA to return to supine due to drop in bp ?  ? ?Transfers ?  ?  ?  ?  ?  ?  ?  ?  ?  ?General transfer comment: unable to attempt standing due to orthostatic BP from 114/66 to 82/54 s/p 5 min of sitting ?  ? ?Ambulation/Gait ?  ?  ?  ?  ?  ?  ?  ?General Gait Details: unable this date ? ?Stairs ?  ?  ?  ?  ?  ? ?Wheelchair Mobility ?  ? ?Modified Rankin (Stroke Patients Only) ?Modified Rankin (Stroke Patients Only) ?Pre-Morbid Rankin Score: Slight disability ?Modified Rankin: Moderately severe disability ? ?  ? ?Balance Overall balance assessment: Needs assistance ?Sitting-balance support: Bilateral upper extremity supported, Feet supported ?Sitting balance-Leahy Scale: Fair ?Sitting balance - Comments: pt initially  close min guard, with onset of dizziness pt leaning to the R onto therapist ?  ?  ?  ?  ?  ?  ?  ?  ?  ?  ?  ?  ?  ?  ?  ?   ? ? ? ?Pertinent Vitals/Pain Pain Assessment ?Pain Assessment: No/denies pain  ? ? ?Home Living  Family/patient expects to be discharged to:: Private residence ?Living Arrangements: Alone ?Available Help at Discharge: Family;Available 24 hours/day (son and his wife are planning on moving in with her after d/c) ?Type of Home: House ?Home Access: Stairs to enter ?Entrance Stairs-Rails: None ?Entrance Stairs-Number of Steps: 2 (brick height) ?  ?Home Layout: One level ?Home Equipment: None ?   ?  ?Prior Function Prior Level of Function : Independent/Modified Independent ?  ?  ?  ?  ?  ?  ?Mobility Comments: driving, yard work, mowing the yard ?ADLs Comments: indep ?  ? ? ?Hand Dominance  ? Dominant Hand: Right ? ?  ?Extremity/Trunk Assessment  ? Upper Extremity Assessment ?Upper Extremity Assessment: Generalized weakness ?  ? ?Lower Extremity Assessment ?Lower Extremity Assessment: Generalized weakness ?  ? ?Cervical / Trunk Assessment ?Cervical / Trunk Assessment: Kyphotic  ?Communication  ? Communication: No difficulties (tangential)  ?Cognition Arousal/Alertness: Awake/alert ?Behavior During Therapy: [facility] for tasks assessed/performed ?Overall Cognitive Status: Impaired/Different from baseline ?Area of Impairment: Attention, Following commands, Safety/judgement, Awareness ?  ?  ?  ?  ?  ?  ?  ?  ?  ?Current Attention Level: Sustained ?  ?Following Commands: Follows one step commands with increased time ?Safety/Judgement: Decreased awareness of safety, Decreased awareness of deficits (L neglect) ?Awareness: Emergent ?  ?General Comments: pt initially with severe L neglect however with education and instruction to turn head to the L and follow with eyes pt demo'd some carry over ?  ?  ? ?  ?General Comments General comments (skin integrity, edema, etc.): orthostatic bp, SpO2 at 94 on RA ? ?  ?Exercises    ? ?Assessment/Plan  ?  ?PT Assessment Patient needs continued PT services  ?PT Problem List Decreased strength;Decreased activity tolerance;Decreased balance;Decreased mobility;Decreased coordination;Decreased  cognition;Decreased knowledge of use of DME;Decreased safety awareness ? ?   ?  ?PT Treatment Interventions DME instruction;Gait training;Stair training;Functional mobility training;Therapeutic activities;Therapeutic exercise;Balance training;Neuromuscular re-education;Cognitive remediation   ? ?PT Goals (Current goals can be found in the Care Plan section)  ?Acute Rehab PT Goals ?Patient Stated Goal: get better ?PT Goal Formulation: With patient/family ?Time For Goal Achievement: 08/06/21 ?Potential to Achieve Goals: Good ? ?  ?Frequency Min 4X/week ?  ? ? ?Co-evaluation   ?  ?  ?  ?  ? ? ?  ?AM-PAC PT "6 Clicks" Mobility  ?Outcome Measure Help needed turning from your back to your side while in a flat bed without using bedrails?: A Lot ?Help needed moving from lying on your back to sitting on the side of a flat bed without using bedrails?: A Lot ?Help needed moving to and from a bed to a chair (including a wheelchair)?: A Lot ?Help needed standing up from a chair using your arms (e.g., wheelchair or bedside chair)?: A Lot ?Help needed to walk in hospital room?: A Lot ?Help needed climbing 3-5 steps with a railing? : Total ?6 Click Score: 11 ? ?  ?End of Session   ?Activity Tolerance: Treatment limited secondary to medical complications (Comment) (drop in BP) ?Patient left: in bed;with call bell/phone within reach;with bed alarm set;with  family/visitor present ?Nurse Communication: Mobility status (drop in BP) ?PT Visit Diagnosis: Unsteadiness on feet (R26.81);Muscle weakness (generalized) (M62.81);Difficulty in walking, not elsewhere classified (R26.2) ?  ? ?Time: 5697-9480 ?PT Time Calculation (min) (ACUTE ONLY): 39 min ? ? ?Charges:   PT Evaluation ?$PT Eval Moderate Complexity: 1 Mod ?PT Treatments ?$Therapeutic Activity: 8-22 mins ?$Neuromuscular Re-education: 8-22 mins ?  ?   ? ? ?Kittie Plater, PT, DPT ?Acute Rehabilitation Services ?Pager #: [phone] ?Office #: [phone] ? ? ?Emslee Lopezmartinez M Kelley Knoth ?07/23/2021,  1:42 PM ? ?

## 2021-07-23 NOTE — Progress Notes (Signed)
[facility]                                                                              Patient Demographics  Lauren Conner, is a 81 y.o. female, DOB - 07-May-1941, WNI:[id number]  Admit date - 07/21/2021   Admitting Physician Jacky Kindle, MD  Outpatient Primary MD for the patient is [place], [facility]  Outpatient specialists:   LOS - 2  days   Medical records reviewed and are as summarized below:          Brief summary   Patient is a 81 year old female with history of SVT, HTN, bronchiectasis, MAI infection followed at [facility] and completed 2 years of Zithromax and rifampin, CKD stage IV initially presented to [facility] after a fall with altered mental status.  Per family she is normally mobile and very independent.  Family found her confused, on the floor with bruising to the face and was brought to ED.  Patient was found to be encephalopathic, febrile.  Chest x-ray with multifocal opacities.  She was admitted, remained encephalopathic hence CT head was obtained which showed large right temporoparietal IPH with 3 mm midline shift and vasogenic edema.  Patient was transferred to [facility] for further management. 3/5 presented to [facility], admitted to [facility] 3/6 txfr to [facility] for 3% after [place] showed large IPH with edema and midline shift 3/7 Looks better this morning, awake and passed swallow  3/8 TRH assumed care at [place] Problem: Acute encephalopathy, acute right temporoparietal IPH in the setting of fall -Possibly hypertensive although may be traumatic.  Neurology following. -MRI/MRA without contrast do not suggest underlying vascular abnormality. -Repeat CT head 3/7 showed stable appearance of right parietal and temporal lobe hemorrhage with surrounding edema and mass effect.  Stable subarachnoid blood along the right temporal lobe. -Not able to perform MRI or CT with contrast due to elevated creatinine -MRA showed mild atheromatous irregularity  about the carotid systems without hemodynamically significant or correctable stenosis.  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 -2D echo showed EF of 65 to 70% -Currently alert and oriented, at baseline mental status, confirmed with son at the bedside -PT OT evaluation-> orthostatic positive, severe dizziness, recommended rehab consult  Active Problems:   Pneumonia of both lungs due to infectious organism, in the setting of chronic bronchiectasis -Followed at [facility], currently not on chronic antibiotics -Patient followed by PCCM, recommended Levaquin for 14 days for likely CAP with possible bronchiectasis exacerbation -RVP negative, blood cultures negative to date -Continue Yupelri and Brovana nebs.  Recommended to stop outpatient inhaled steroids in the setting of bronchiectasis     Acute urinary retention with significant bilateral hydronephrosis -Patient was found to have acute kidney injury on CKD stage IV, UA negative for UTI - CT abdomen pelvis showed marked severity bilateral hydronephrosis, proximal hydroureter in the setting of markedly distended urinary bladder -Foley catheter was placed, negative balance of 1.7 L -Placed on Flomax daily -Discussed with urology on-call, Dr. Diona Fanti, recommended continue Foley catheter and Flomax.  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   Weight as of  07/19/21: 45.3 kg.  Code Status: Full CODE STATUS DVT Prophylaxis:  Place TED hose Start: 07/23/21 1309 SCD's Start: 07/21/21 1713   Level of Care: Level of care: Progressive Family Communication: Discussed all imaging results, lab results, explained to the patient 's son at the bedside   Disposition Plan:     Status is: Inpatient Remains inpatient appropriate because: Work-up in progress, not safe for discharge.  Orthostatic on PT evaluation  Time Spent in minutes 50 minutes  Procedures:  MRI brain  Consultants:   PCCM Neurology  Antimicrobials:  Levaquin 500 mg daily 07/21/2021--->     Medications  Scheduled Meds:  arformoterol  15 mcg Nebulization BID   Chlorhexidine Gluconate Cloth  6 each Topical Daily   levofloxacin  500 mg Oral Q48H   levothyroxine  75 mcg Oral Q0600   pantoprazole  40 mg Oral Daily   revefenacin  175 mcg Nebulization Daily   senna-docusate  1 tablet Oral BID   sodium bicarbonate  650 mg Oral TID   tamsulosin  0.4 mg Oral Daily   Continuous Infusions:  sodium chloride 50 mL/hr at 07/23/21 0200   PRN Meds:.acetaminophen **OR** acetaminophen (TYLENOL) oral liquid 160 mg/5 mL **OR** acetaminophen, ondansetron (ZOFRAN) IV      Subjective:   Lauren Conner was seen and examined today.  Alert and oriented, son at the bedside.  Patient feels that she is improving since admission.  Has some low back pain from the bed otherwise no acute issues.  No acute chest pain, shortness of breath, nausea vomiting or abdominal pain.    Objective:   Vitals:   07/23/21 0745 07/23/21 1027 07/23/21 1030 07/23/21 1229  BP: 124/81   108/67  Pulse: 86 92  77  Resp: 18 16  18   Temp: 97.7 F (36.5 C)   98 F (36.7 C)  TempSrc: Oral   Oral  SpO2: 95% 95% 95% 96%    Intake/Output Summary (Last 24 hours) at 07/23/2021 1328 Last data filed at 07/23/2021 0800 Gross per 24 hour  Intake 756.06 ml  Output 1820 ml  Net -1063.94 ml     Wt Readings from Last 3  Encounters:  07/19/21 45.3 kg     Exam General: Alert and oriented x 3, NAD Cardiovascular: S1 S2 auscultated, no murmurs, RRR Respiratory: Clear to auscultation bilaterally, no wheezing, rales or rhonchi Gastrointestinal: Soft, nontender, nondistended, + bowel sounds Ext: no pedal edema bilaterally Neuro: Strength 5/5 upper and lower extremities bilaterally Psych: Normal affect and demeanor, alert and oriented x3    Data Reviewed:  I have personally reviewed following labs and imaging studies  Micro Results Recent Results (from the past 240 hour(s))  Resp Panel by RT-PCR (Flu A&B, Covid) Nasopharyngeal Swab     Status: None   Collection Time: 07/19/21  9:17 PM   Specimen: Nasopharyngeal Swab; Nasopharyngeal(NP) swabs in vial transport medium  Result Value  Ref Range Status   SARS Coronavirus 2 by RT PCR NEGATIVE NEGATIVE Final    Comment: (NOTE) SARS-CoV-2 target nucleic acids are NOT DETECTED.  The SARS-CoV-2 RNA is generally detectable in upper respiratory specimens during the acute phase of infection. The lowest concentration of SARS-CoV-2 viral copies this assay can detect is 138 copies/mL. A negative result does not preclude SARS-Cov-2 infection and should not be used as the sole basis for treatment or other patient management decisions. A negative result may occur with  improper specimen collection/handling, submission of specimen other than nasopharyngeal swab, presence of viral mutation(s) within the areas targeted by this assay, and inadequate number of viral copies(<138 copies/mL). A negative result must be combined with clinical observations, patient history, and epidemiological information. The expected result is Negative.  Fact Sheet for Patients:  EntrepreneurPulse.com.au  Fact Sheet for Healthcare Providers:  IncredibleEmployment.be  This test is no t yet approved or cleared by the [place] FDA and  has been  authorized for detection and/or diagnosis of SARS-CoV-2 by FDA under an Emergency Use Authorization (EUA). This EUA will remain  in effect (meaning this test can be used) for the duration of the COVID-19 declaration under Section 564(b)(1) of the Act, 21 U.S.C.section 360bbb-3(b)(1), unless the authorization is terminated  or revoked sooner.       Influenza A by PCR NEGATIVE NEGATIVE Final   Influenza B by PCR NEGATIVE NEGATIVE Final    Comment: (NOTE) The Xpert Xpress SARS-CoV-2/FLU/RSV plus assay is intended as an aid in the diagnosis of influenza from Nasopharyngeal swab specimens and should not be used as a sole basis for treatment. Nasal washings and aspirates are unacceptable for Xpert Xpress SARS-CoV-2/FLU/RSV testing.  Fact Sheet for Patients: EntrepreneurPulse.com.au  Fact Sheet for Healthcare Providers: IncredibleEmployment.be  This test is not yet approved or cleared by the [place] FDA and has been authorized for detection and/or diagnosis of SARS-CoV-2 by FDA under an Emergency Use Authorization (EUA). This EUA will remain in effect (meaning this test can be used) for the duration of the COVID-19 declaration under Section 564(b)(1) of the Act, 21 U.S.C. section 360bbb-3(b)(1), unless the authorization is terminated or revoked.  Performed at [facility], [address] [place], [place] [address]   Culture, blood (Routine x 2)     Status: None (Preliminary result)   Collection Time: 07/19/21  9:23 PM   Specimen: BLOOD  Result Value Ref Range Status   Specimen Description BLOOD RIGHT ANTECUBITAL  Final   Special Requests   Final    BOTTLES DRAWN AEROBIC AND ANAEROBIC Blood Culture adequate volume   Culture   Final    NO GROWTH 4 DAYS Performed at [facility], [address] [place], [place] [address]    Report Status PENDING  Incomplete  Culture, blood (Routine x 2)     Status: None (Preliminary  result)   Collection Time: 07/19/21  9:28 PM   Specimen: BLOOD  Result Value Ref Range Status   Specimen Description BLOOD BLOOD LEFT FOREARM  Final   Special Requests   Final    BOTTLES DRAWN AEROBIC AND ANAEROBIC Blood Culture results may not be optimal due to an excessive volume of blood received in culture bottles   Culture   Final    NO GROWTH 4 DAYS Performed at [facility], [address] [place],  [address]    Report Status PENDING  Incomplete  Urine Culture     Status: None  Collection Time: 07/20/21  7:45 AM   Specimen: Urine, Clean Catch  Result Value Ref Range Status   Specimen Description   Final    URINE, CLEAN CATCH Performed at [facility], [address], [place], [place] [address]    Special Requests   Final    NONE Performed at [facility], [address], [place], [place] [address]    Culture   Final    NO GROWTH Performed at [facility], [place] [address], [place], [place] [address]    Report Status 07/21/2021 FINAL  Final  Respiratory (~20 pathogens) panel by PCR     Status: None   Collection Time: 07/20/21  8:15 AM   Specimen: Nasopharyngeal Swab; Respiratory  Result Value Ref Range Status   Adenovirus NOT DETECTED NOT DETECTED Final   Coronavirus 229E NOT DETECTED NOT DETECTED Final    Comment: (NOTE) The Coronavirus on the Respiratory Panel, DOES NOT test for the novel  Coronavirus (2019 nCoV)    Coronavirus HKU1 NOT DETECTED NOT DETECTED Final   Coronavirus NL63 NOT DETECTED NOT DETECTED Final   Coronavirus OC43 NOT DETECTED NOT DETECTED Final   Metapneumovirus NOT DETECTED NOT DETECTED Final   Rhinovirus / Enterovirus NOT DETECTED NOT DETECTED Final   Influenza A NOT DETECTED NOT DETECTED Final   Influenza B NOT DETECTED NOT DETECTED Final   Parainfluenza Virus 1 NOT DETECTED NOT DETECTED Final   Parainfluenza Virus 2 NOT DETECTED NOT DETECTED Final   Parainfluenza Virus 3 NOT DETECTED NOT  DETECTED Final   Parainfluenza Virus 4 NOT DETECTED NOT DETECTED Final   Respiratory Syncytial Virus NOT DETECTED NOT DETECTED Final   Bordetella pertussis NOT DETECTED NOT DETECTED Final   Bordetella Parapertussis NOT DETECTED NOT DETECTED Final   Chlamydophila pneumoniae NOT DETECTED NOT DETECTED Final   Mycoplasma pneumoniae NOT DETECTED NOT DETECTED Final    Comment: Performed at [facility], [place] [address], [place], [place] [address]  MRSA Next Gen by PCR, Nasal     Status: None   Collection Time: 07/20/21  8:15 AM   Specimen: Nasopharyngeal Swab; Nasal Swab  Result Value Ref Range Status   MRSA by PCR Next Gen NOT DETECTED NOT DETECTED Final    Comment: (NOTE) The GeneXpert MRSA Assay (FDA approved for NASAL specimens only), is one component of a comprehensive MRSA colonization surveillance program. It is not intended to diagnose MRSA infection nor to guide or monitor treatment for MRSA infections. Test performance is not FDA approved in patients less than 85 years old. Performed at [facility], [address] [place], [place] [address]   MRSA Next Gen by PCR, Nasal     Status: None   Collection Time: 07/21/21  5:44 PM   Specimen: Nasal Mucosa; Nasal Swab  Result Value Ref Range Status   MRSA by PCR Next Gen NOT DETECTED NOT DETECTED Final    Comment: (NOTE) The GeneXpert MRSA Assay (FDA approved for NASAL specimens only), is one component of a comprehensive MRSA colonization surveillance program. It is not intended to diagnose MRSA infection nor to guide or monitor treatment for MRSA infections. Test performance is not FDA approved in patients less than 30 years old. Performed at [facility], [place] [address], [place], [place] [address]     Radiology Reports CT ABDOMEN PELVIS WO CONTRAST  Result Date: 07/20/2021 CLINICAL DATA:  Altered mental status with fever, tachycardia and tachypnea. EXAM: CT ABDOMEN AND PELVIS WITHOUT  CONTRAST TECHNIQUE:  Multidetector CT imaging of the abdomen and pelvis was performed following the standard protocol without IV contrast. RADIATION DOSE REDUCTION: This exam was performed according to the departmental dose-optimization program which includes automated exposure control, adjustment of the mA and/or kV according to patient size and/or use of iterative reconstruction technique. COMPARISON:  None. FINDINGS: Lower chest: The study is markedly limited secondary to patient motion. Marked severity multifocal infiltrates and extensive bronchiectasis are seen throughout both lung bases. Hepatobiliary: 13 mm and 18 mm diameter cysts are seen within the anterior aspect of the right lobe of the liver. A 10 mm cystic appearing area is seen within the inferior aspect of the right lobe. There is mild central intrahepatic biliary dilatation. The gallbladder is mildly distended without evidence of gallstones, gallbladder wall thickening or pericholecystic fluid. Pancreas: Unremarkable. No pancreatic ductal dilatation or surrounding inflammatory changes. Spleen: Punctate calcified granulomas are seen scattered throughout the parenchyma of a small spleen. Adrenals/Urinary Tract: Adrenal glands are unremarkable. Kidneys are normal in size. A 2.0 cm diameter cyst is seen along the posterior aspect of the mid left kidney. There is marked severity bilateral hydronephrosis and proximal hydroureter without obstructing renal calculi. Bilateral nonspecific perinephric inflammatory fat stranding is seen. The urinary bladder is markedly distended. Stomach/Bowel: There is a large hiatal hernia. Appendix appears normal. No evidence of bowel wall thickening, distention, or inflammatory changes. Noninflamed diverticula are seen throughout the large bowel. Vascular/Lymphatic: Mild aortic atherosclerosis. No enlarged abdominal or pelvic lymph nodes. Reproductive: Uterus and bilateral adnexa are unremarkable. Other: No abdominal wall hernia or abnormality.  No abdominopelvic ascites. Musculoskeletal: Marked severity multilevel degenerative changes are seen throughout the lumbar spine. IMPRESSION: 1. Marked severity bibasilar multifocal infiltrates. 2. Marked severity bilateral hydronephrosis and proximal hydroureter in the setting of a markedly distended urinary bladder. 3. Large hiatal hernia. 4. Colonic diverticulosis. 5. Marked severity multilevel degenerative changes throughout the lumbar spine. 6. Aortic atherosclerosis. Aortic Atherosclerosis (ICD10-I70.0). Electronically Signed   By: Virgina Norfolk M.D.   On: 07/20/2021 02:02   CT HEAD WO CONTRAST  Result Date: 07/22/2021 CLINICAL DATA:  Intracranial hemorrhage. EXAM: CT HEAD WITHOUT CONTRAST TECHNIQUE: Contiguous axial images were obtained from the base of the skull through the vertex without intravenous contrast. RADIATION DOSE REDUCTION: This exam was performed according to the departmental dose-optimization program which includes automated exposure control, adjustment of the mA and/or kV according to patient size and/or use of iterative reconstruction technique. COMPARISON:  CT head without contrast 07/21/2021. MR head and MRA head 07/21/2021. FINDINGS: Brain: Hemorrhage involving the right parietal and temporal lobe is not significantly changed in size, measuring 4.0 x 3.2 x 4.4 cm. Blood products have begun to lay are. Surrounding edema and mass effect is similar the prior studies. Effacement of the sulci the posterior horn of the right lateral ventricle noted. Minimal intraventricular blood products are unchanged. Subarachnoid blood along the right temporal lobe is also stable. No new hemorrhage is present. One 2 mm midline shift is stable. No significant extra-axial fluid is present on the left hemisphere. The brainstem and cerebellum are within normal limits. Vascular: Atherosclerotic calcifications are present within the cavernous internal carotid arteries bilaterally. No hyperdense vessel is  present. Skull: Calvarium is intact. No focal lytic or blastic lesions are present. No significant extracranial soft tissue lesion is present. Sinuses/Orbits: Fluid is present inferior right mastoid air cells. Scratched at fluid is present the inferior mastoid air cells bilaterally. This is increased slightly. Obstructing lesions are present. The  paranasal sinuses and mastoid air cells are otherwise clear. The globes and orbits are within normal limits. IMPRESSION: 1. Stable appearance of right parietal and temporal lobe hemorrhage with surrounding edema and mass effect. 2. Stable subarachnoid blood along the right temporal lobe. 3. Stable minimal intraventricular blood products. 4. No new hemorrhage. 5. Slight increase in bilateral mastoid effusions. Electronically Signed   By: San Morelle M.D.   On: 07/22/2021 15:01   CT HEAD WO CONTRAST (5MM)  Result Date: 07/21/2021 CLINICAL DATA:  Tension-type headache EXAM: CT HEAD WITHOUT CONTRAST TECHNIQUE: Contiguous axial images were obtained from the base of the skull through the vertex without intravenous contrast. RADIATION DOSE REDUCTION: This exam was performed according to the departmental dose-optimization program which includes automated exposure control, adjustment of the mA and/or kV according to patient size and/or use of iterative reconstruction technique. COMPARISON:  None FINDINGS: Brain: Generalized atrophy. Mild effacement of the occipital horn and atrium of the RIGHT lateral ventricle. Remaining ventricular system normal appearance. Large area of acute intraparenchymal hemorrhage identified within the RIGHT temporoparietal lobe 4.0 x 4.0 x 4.1 cm with surrounding vasogenic edema. Additionally, small amount of subarachnoid hemorrhage is seen within sulci at the anterior RIGHT temporal lobe. No intraventricular extension of hemorrhage. 3 mm of RIGHT to LEFT midline shift. Underlying small vessel chronic ischemic changes of deep cerebral white  matter. No focal mass identified. Vascular: Atherosclerotic calcification of internal carotid arteries at skull base Skull: Intact Sinuses/Orbits: Clear Other: N/A IMPRESSION: Large area of acute intraparenchymal hemorrhage within the RIGHT temporoparietal lobe 4.0 x 4.0 x 4.1 cm with surrounding vasogenic edema and mild effacement of the occipital horn and atrium of the RIGHT lateral ventricle. Small amount of subarachnoid hemorrhage within sulci at the anterior RIGHT temporal lobe. 3 mm of RIGHT to LEFT midline shift. Underlying small vessel chronic ischemic changes of deep cerebral white matter. Critical Value/emergent results were called by telephone at the time of interpretation on 07/21/2021 at 12:56 pm to provider Laurey Arrow MD, who verbally acknowledged these results. Electronically Signed   By: Lavonia Dana M.D.   On: 07/21/2021 12:57   CT CHEST WO CONTRAST  Result Date: 07/20/2021 CLINICAL DATA:  81 year old female with history of community-acquired pneumonia. EXAM: CT CHEST WITHOUT CONTRAST TECHNIQUE: Multidetector CT imaging of the chest was performed following the standard protocol without IV contrast. RADIATION DOSE REDUCTION: This exam was performed according to the departmental dose-optimization program which includes automated exposure control, adjustment of the mA and/or kV according to patient size and/or use of iterative reconstruction technique. COMPARISON:  No priors. FINDINGS: Cardiovascular: Heart size is normal. There is no significant pericardial fluid, thickening or pericardial calcification. Aortic atherosclerosis. No coronary artery calcifications. Mediastinum/Nodes: No pathologically enlarged mediastinal or hilar lymph nodes. Please note that accurate exclusion of hilar adenopathy is limited on noncontrast CT scans. Large hiatal hernia. No axillary lymphadenopathy. Lungs/Pleura: Study is limited by considerable patient respiratory motion. With these limitations in mind, there are  widespread areas of cylindrical, varicose and occasional cystic bronchiectasis, with extensive thickening of the peribronchovascular interstitium, regional areas of architectural distortion, and extensive areas of peribronchovascular micro and macronodularity, most compatible with infected areas of mucoid impaction. No pleural effusions. Upper Abdomen: Aortic atherosclerosis. Musculoskeletal: There are no aggressive appearing lytic or blastic lesions noted in the visualized portions of the skeleton. IMPRESSION: 1. 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 Given the widespread micro  and macronodularity, acute superinfection is not excluded. 2. Large hiatal hernia. 3. Aortic atherosclerosis. Aortic Atherosclerosis (ICD10-I70.0). Electronically Signed   By: Vinnie Langton M.D.   On: 07/20/2021 09:11   MR ANGIO HEAD WO CONTRAST  Result Date: 07/21/2021 CLINICAL DATA:  Initial evaluation for altered mental status. EXAM: MRI HEAD WITHOUT CONTRAST MRA HEAD WITHOUT CONTRAST TECHNIQUE: Multiplanar, multi-echo pulse sequences of the brain and surrounding structures were acquired without intravenous contrast. Angiographic images of the Circle of Willis were acquired using MRA technique without intravenous contrast. COMPARISON:  Prior CT from earlier the same day. FINDINGS: MRI HEAD FINDINGS Brain: Generalized age-related cerebral atrophy. Patchy and confluent T2/FLAIR hyperintensity involving the periventricular deep white matter both cerebral hemispheres as well as the pons, consistent with chronic small vessel ischemic disease, moderately advanced in nature. Small remote left cerebellar infarct noted. Previously identified intraparenchymal hematoma centered at the left temporal occipital region again seen. Hemorrhage measures approximately 3.7 x 3.7 x 3.7 cm (estimated volume 25 mL). No visible underlying mass  lesion or other structural abnormality on this noncontrast examination. Associated small volume subarachnoid hemorrhage within the adjacent right temporal region. Surrounding vasogenic edema with partial mass effect on the atrium of the right lateral ventricle and trace 3 mm right-to-left shift. No hydrocephalus or trapping. Basilar cisterns remain patent. Trace intraventricular hemorrhage noted as well, likely related to redistribution. No other evidence for acute intracranial hemorrhage. No acute or subacute infarct elsewhere within the brain. Gray-white matter differentiation otherwise maintained. No other foci of susceptibility artifact to suggest acute or chronic intracranial blood products. No mass lesion or extra-axial fluid collection. Pituitary gland suprasellar region normal. Midline structures intact. Vascular: Major intracranial vascular flow voids are well maintained. Skull and upper cervical spine: Craniocervical junction within normal limits. Bone marrow signal intensity normal. No scalp soft tissue abnormality. Sinuses/Orbits: Globes orbital soft tissues within normal limits. Mild mucosal thickening noted within the ethmoidal air cells. Paranasal sinuses are otherwise clear. Small bilateral mastoid effusions noted. Visualized nasopharynx unremarkable. Other: None. MRA HEAD FINDINGS Anterior circulation: Examination degraded by motion artifact and positioning. Visualized distal cervical segments of the internal carotid arteries are patent with antegrade flow. Petrous segments patent bilaterally. Atheromatous irregularity seen within the carotid siphons without hemodynamically significant stenosis. Irregular 5 mm focal outpouching extending laterally from the cavernous right ICA consistent with a small aneurysm (series 19, image 57). A1 segments patent bilaterally. Normal anterior communicating artery complex. Both anterior cerebral arteries grossly patent to their distal aspects without significant  stenosis. No M1 stenosis or occlusion. Normal MCA bifurcations. Distal MCA branches well perfused and fairly symmetric. Posterior circulation: Visualized distal V4 segments patent without stenosis. Right vertebral artery slightly dominant. Basilar patent to its distal aspect without stenosis. Superior cerebellar arteries patent bilaterally. Both PCAs primarily supplied via the basilar well perfused or distal aspects. Anatomic variants: None significant. No vascular abnormality seen underlying the right cerebral hemorrhage. IMPRESSION: MRI HEAD IMPRESSION: 1. 25 mL intraparenchymal hematoma centered at the right temporoccipital region. No visible underlying mass lesion or other structural abnormality on this noncontrast examination. Associated vasogenic edema with trace 3 mm of right-to-left shift. 2. Associated small volume subarachnoid hemorrhage within the adjacent right cerebral hemisphere. Trace intraventricular hemorrhage likely related to redistribution. No hydrocephalus or trapping. 3. Underlying age-related cerebral atrophy with moderately advanced chronic microvascular ischemic disease. 4. Small remote left cerebellar infarct. MRA HEAD IMPRESSION: 1. No vascular abnormality seen underlying the right cerebral hemorrhage. Negative intracranial MRA 2. For large vessel occlusion. Mild  atheromatous irregularity about the carotid siphons without hemodynamically significant or correctable stenosis. 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 Electronically Signed   By: Jeannine Boga M.D.   On: 07/21/2021 23:27   MR BRAIN WO CONTRAST  Result Date: 07/21/2021 CLINICAL DATA:  Initial evaluation for altered mental status. EXAM: MRI HEAD WITHOUT CONTRAST MRA HEAD WITHOUT CONTRAST TECHNIQUE: Multiplanar, multi-echo pulse sequences of the brain and surrounding structures were acquired without intravenous contrast. Angiographic images of the Circle of Willis were acquired using MRA technique  without intravenous contrast. COMPARISON:  Prior CT from earlier the same day. FINDINGS: MRI HEAD FINDINGS Brain: Generalized age-related cerebral atrophy. Patchy and confluent T2/FLAIR hyperintensity involving the periventricular deep white matter both cerebral hemispheres as well as the pons, consistent with chronic small vessel ischemic disease, moderately advanced in nature. Small remote left cerebellar infarct noted. Previously identified intraparenchymal hematoma centered at the left temporal occipital region again seen. Hemorrhage measures approximately 3.7 x 3.7 x 3.7 cm (estimated volume 25 mL). No visible underlying mass lesion or other structural abnormality on this noncontrast examination. Associated small volume subarachnoid hemorrhage within the adjacent right temporal region. Surrounding vasogenic edema with partial mass effect on the atrium of the right lateral ventricle and trace 3 mm right-to-left shift. No hydrocephalus or trapping. Basilar cisterns remain patent. Trace intraventricular hemorrhage noted as well, likely related to redistribution. No other evidence for acute intracranial hemorrhage. No acute or subacute infarct elsewhere within the brain. Gray-white matter differentiation otherwise maintained. No other foci of susceptibility artifact to suggest acute or chronic intracranial blood products. No mass lesion or extra-axial fluid collection. Pituitary gland suprasellar region normal. Midline structures intact. Vascular: Major intracranial vascular flow voids are well maintained. Skull and upper cervical spine: Craniocervical junction within normal limits. Bone marrow signal intensity normal. No scalp soft tissue abnormality. Sinuses/Orbits: Globes orbital soft tissues within normal limits. Mild mucosal thickening noted within the ethmoidal air cells. Paranasal sinuses are otherwise clear. Small bilateral mastoid effusions noted. Visualized nasopharynx unremarkable. Other: None. MRA HEAD  FINDINGS Anterior circulation: Examination degraded by motion artifact and positioning. Visualized distal cervical segments of the internal carotid arteries are patent with antegrade flow. Petrous segments patent bilaterally. Atheromatous irregularity seen within the carotid siphons without hemodynamically significant stenosis. Irregular 5 mm focal outpouching extending laterally from the cavernous right ICA consistent with a small aneurysm (series 19, image 57). A1 segments patent bilaterally. Normal anterior communicating artery complex. Both anterior cerebral arteries grossly patent to their distal aspects without significant stenosis. No M1 stenosis or occlusion. Normal MCA bifurcations. Distal MCA branches well perfused and fairly symmetric. Posterior circulation: Visualized distal V4 segments patent without stenosis. Right vertebral artery slightly dominant. Basilar patent to its distal aspect without stenosis. Superior cerebellar arteries patent bilaterally. Both PCAs primarily supplied via the basilar well perfused or distal aspects. Anatomic variants: None significant. No vascular abnormality seen underlying the right cerebral hemorrhage. IMPRESSION: MRI HEAD IMPRESSION: 1. 25 mL intraparenchymal hematoma centered at the right temporoccipital region. No visible underlying mass lesion or other structural abnormality on this noncontrast examination. Associated vasogenic edema with trace 3 mm of right-to-left shift. 2. Associated small volume subarachnoid hemorrhage within the adjacent right cerebral hemisphere. Trace intraventricular hemorrhage likely related to redistribution. No hydrocephalus or trapping. 3. Underlying age-related cerebral atrophy with moderately advanced chronic microvascular ischemic disease. 4. Small remote left cerebellar infarct. MRA HEAD IMPRESSION: 1. No vascular abnormality seen underlying the right cerebral hemorrhage. Negative intracranial MRA  2. For large vessel occlusion. Mild  atheromatous irregularity about the carotid siphons without hemodynamically significant or correctable stenosis. 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 Electronically Signed   By: Jeannine Boga M.D.   On: 07/21/2021 23:27   DG Chest Port 1 View  Result Date: 07/19/2021 CLINICAL DATA:  Suspected sepsis.  Altered mental status. EXAM: PORTABLE CHEST 1 VIEW COMPARISON:  None. FINDINGS: The heart is enlarged. There is a retrocardiac hiatal hernia. Patchy airspace opacities throughout the right lung and at the left lung base. There is background interstitial thickening. No significant pleural effusion. No pneumothorax. The bones are under mineralized. No acute osseous findings. IMPRESSION: 1. Patchy airspace opacities throughout the right lung and at the left lung base. Differential considerations include multifocal pneumonia, including atypical organisms, or asymmetric pulmonary edema. 2. Cardiomegaly and hiatal hernia. Electronically Signed   By: Keith Rake M.D.   On: 07/19/2021 21:52   ECHOCARDIOGRAM COMPLETE  Result Date: 07/22/2021    ECHOCARDIOGRAM REPORT   Patient Name:   Lauren Conner Date of Exam: 07/22/2021 Medical Rec #:  [id number]   Height:       63.0 in Accession #:    [id number]  Weight:       99.9 lb Date of Birth:  02/03/1941   BSA:          1.439 m Patient Age:    53 years    BP:           121/63 mmHg Patient Gender: F           HR:           97 bpm. Exam Location:  Inpatient Procedure: 2D Echo, Cardiac Doppler and Color Doppler Indications:    Stroke  History:        Patient has no prior history of Echocardiogram examinations.                 Risk Factors:Hypertension.  Sonographer:    Jyl Heinz Referring Phys: [id number] [place]  1. Left ventricular ejection fraction, by estimation, is 65 to 70%. Left ventricular ejection fraction by 2D MOD biplane is 65.2 %. The left ventricle has normal function. The left ventricle has no regional wall motion  abnormalities. Left ventricular diastolic parameters are consistent with Grade I diastolic dysfunction (impaired relaxation).  2. Right ventricular systolic function is low normal. The right ventricular size is normal. There is severely elevated pulmonary artery systolic pressure. The estimated right ventricular systolic pressure is 17.6 mmHg.  3. The mitral valve is grossly normal. Trivial mitral valve regurgitation.  4. The aortic valve is tricuspid. Aortic valve regurgitation is trivial. Aortic valve sclerosis is present, with no evidence of aortic valve stenosis. Aortic regurgitation PHT measures 509 msec.  5. The inferior vena cava is dilated in size with >50% respiratory variability, suggesting right atrial pressure of 8 mmHg. Comparison(s): No prior Echocardiogram. FINDINGS  Left Ventricle: Left ventricular ejection fraction, by estimation, is 65 to 70%. Left ventricular ejection fraction by 2D MOD biplane is 65.2 %. The left ventricle has normal function. The left ventricle has no regional wall motion abnormalities. The left ventricular internal cavity size was normal in size. There is no left ventricular hypertrophy. Left ventricular diastolic parameters are consistent with Grade I diastolic dysfunction (impaired relaxation). Indeterminate filling pressures. Right Ventricle: The right ventricular size is normal. No increase in right ventricular wall thickness. Right ventricular systolic function is low normal. There  is severely elevated pulmonary artery systolic pressure. The tricuspid regurgitant velocity is 3.91 m/s, and with an assumed right atrial pressure of 8 mmHg, the estimated right ventricular systolic pressure is 26.7 mmHg. Left Atrium: Left atrial size was normal in size. Right Atrium: Right atrial size was normal in size. Pericardium: There is no evidence of pericardial effusion. Mitral Valve: The mitral valve is grossly normal. Trivial mitral valve regurgitation. Tricuspid Valve: The tricuspid  valve is grossly normal. Tricuspid valve regurgitation is trivial. Aortic Valve: The aortic valve is tricuspid. Aortic valve regurgitation is trivial. Aortic regurgitation PHT measures 509 msec. Aortic valve sclerosis is present, with no evidence of aortic valve stenosis. Aortic valve peak gradient measures 7.6 mmHg. Pulmonic Valve: The pulmonic valve was grossly normal. Pulmonic valve regurgitation is not visualized. Aorta: The aortic root and ascending aorta are structurally normal, with no evidence of dilitation. Venous: The inferior vena cava is dilated in size with greater than 50% respiratory variability, suggesting right atrial pressure of 8 mmHg. IAS/Shunts: No atrial level shunt detected by color flow Doppler.  LEFT VENTRICLE PLAX 2D                        Biplane EF (MOD) LVIDd:         4.00 cm         LV Biplane EF:   Left LVIDs:         2.50 cm                          ventricular LV PW:         1.00 cm                          ejection LV IVS:        1.00 cm                          fraction by LVOT diam:     2.00 cm                          2D MOD LV SV:         67                               biplane is LV SV Index:   47                               65.2 %. LVOT Area:     3.14 cm                                Diastology                                LV e' medial:    5.22 cm/s LV Volumes (MOD)               LV E/e' medial:  12.0 LV vol d, MOD    59.4 ml       LV e' lateral:   6.64 cm/s A2C:  LV E/e' lateral: 9.4 LV vol d, MOD    60.8 ml A4C: LV vol s, MOD    20.3 ml A2C: LV vol s, MOD    20.7 ml A4C: LV SV MOD A2C:   39.1 ml LV SV MOD A4C:   60.8 ml LV SV MOD BP:    39.4 ml RIGHT VENTRICLE             IVC RV Basal diam:  3.30 cm     IVC diam: 2.00 cm RV Mid diam:    3.10 cm RV S prime:     10.40 cm/s TAPSE (M-mode): 2.3 cm LEFT ATRIUM             Index        RIGHT ATRIUM           Index LA diam:        3.80 cm 2.64 cm/m   RA Area:     17.20 cm LA Vol (A2C):   27.3 ml  18.97 ml/m  RA Volume:   45.10 ml  31.33 ml/m LA Vol (A4C):   44.1 ml 30.64 ml/m LA Biplane Vol: 36.8 ml 25.57 ml/m  AORTIC VALVE AV Area (Vmax): 2.62 cm AV Vmax:        138.00 cm/s AV Peak Grad:   7.6 mmHg LVOT Vmax:      115.00 cm/s LVOT Vmean:     79.400 cm/s LVOT VTI:       0.214 m AI PHT:         509 msec  AORTA Ao Root diam: 3.20 cm Ao Asc diam:  3.00 cm MITRAL VALVE               TRICUSPID VALVE MV Area (PHT): 3.68 cm    TR Peak grad:   61.2 mmHg MV Decel Time: 206 msec    TR Vmax:        391.00 cm/s MV E velocity: 62.60 cm/s MV A velocity: 83.10 cm/s  SHUNTS MV E/A ratio:  0.75        Systemic VTI:  0.21 m                            Systemic Diam: 2.00 cm Lyman Bishop MD Electronically signed by Lyman Bishop MD Signature Date/Time: 07/22/2021/10:47:58 AM    Final     Lab Data:  CBC: Recent Labs  Lab 07/19/21 2120 07/20/21 0745 07/21/21 0414 07/22/21 0424 07/23/21 0119  WBC 17.1* 13.6* 11.0* 8.4 9.3  HGB 10.2* 9.1* 7.6* 7.9* 9.0*  HCT 31.8* 27.5* 23.1* 24.0* 27.8*  MCV 95.5 93.5 92.0 94.9 96.2  PLT 343 273 238 193 244   Basic Metabolic Panel: Recent Labs  Lab 07/19/21 2200 07/20/21 0745 07/21/21 0414 07/21/21 1746 07/21/21 2337 07/22/21 0424 07/23/21 0119  NA 126* 129* 133* 130* 132* 132* 133*  K 4.7 4.5 4.1  --   --  4.3 4.0  CL 97* 102 105  --   --  105 105  CO2 17* 16* 16*  --   --  18* 18*  GLUCOSE 157* 116* 90  --   --  51* 95  BUN 41* 39* 47*  --   --  46* 42*  CREATININE 2.13* 2.07* 2.39*  --   --  2.54* 2.59*  CALCIUM 8.9 8.3* 8.1*  --   --  7.9* 8.2*  MG  --   --   --   --   --  1.7  --   PHOS  --   --   --   --   --  4.0  --    GFR: Estimated Creatinine Clearance: 12.6 mL/min (A) (by C-G formula based on SCr of 2.59 mg/dL (H)). Liver Function Tests: Recent Labs  Lab 07/19/21 2200 07/21/21 0414  AST 35 20  ALT 15 12  ALKPHOS 68 53  BILITOT 0.7 0.6  PROT 7.9 6.0*  ALBUMIN 3.1* 2.4*   No results for input(s): LIPASE, AMYLASE in the last 168  hours. No results for input(s): AMMONIA in the last 168 hours. Coagulation Profile: Recent Labs  Lab 07/20/21 0016  INR 1.2   Cardiac Enzymes: No results for input(s): CKTOTAL, CKMB, CKMBINDEX, TROPONINI in the last 168 hours. BNP (last 3 results) No results for input(s): PROBNP in the last 8760 hours. HbA1C: Recent Labs    07/23/21 0119  HGBA1C 5.1   CBG: Recent Labs  Lab 07/22/21 0746 07/22/21 0836 07/22/21 1146 07/23/21 0118  GLUCAP 44* 111* 77 96   Lipid Profile: Recent Labs    07/23/21 0119  CHOL 157  HDL 45  LDLCALC 94  TRIG 90  CHOLHDL 3.5   Thyroid Function Tests: Recent Labs    07/22/21 1106  TSH 10.941*   Anemia Panel: No results for input(s): VITAMINB12, FOLATE, FERRITIN, TIBC, IRON, RETICCTPCT in the last 72 hours. Urine analysis:    Component Value Date/Time   COLORURINE STRAW (A) 07/19/2021 2117   APPEARANCEUR CLEAR (A) 07/19/2021 2117   LABSPEC 1.012 07/19/2021 2117   PHURINE 6.0 07/19/2021 2117   GLUCOSEU NEGATIVE 07/19/2021 2117   HGBUR SMALL (A) 07/19/2021 2117   BILIRUBINUR NEGATIVE 07/19/2021 2117   KETONESUR NEGATIVE 07/19/2021 2117   PROTEINUR >=300 (A) 07/19/2021 2117   NITRITE NEGATIVE 07/19/2021 2117   LEUKOCYTESUR NEGATIVE 07/19/2021 2117     Lauren Conner M.D. [facility] 07/23/2021, 1:28 PM  Available via Epic secure chat 7am-7pm After 7 pm, please refer to night coverage provider listed on amion.

## 2021-07-23 NOTE — Evaluation (Signed)
Occupational Therapy Evaluation ?Patient Details ?Name: Lauren Conner ?MRN: [id number] ?DOB: 1940/06/21 ?Today's Date: 07/23/2021 ? ? ?History of Present Illness 81 year old female  who presented to [facility] with altered mental status and frequent syncope, CT revealed right parietotemporal intraparenchymal hemorrhage PMH:  MAI 2 years ago, CKD stage IV  ? ?Clinical Impression ?  ?Patient admitted for the diagnosis above.  PTA she lived aline and needed no assist with ADL/IADL, or mobility.  Visual deficit, low BP and balance issues are the primary deficits. Currently she is needing up to Min A for basic bed mobility and stand pivot tranfers at RW level, and up to Mod A for lower body ADL from a sit/stand level.  OT will follow in the acute setting, and AIR is recommended for high intensity post acute rehab to maximize her functional status for an eventual return home.     ?   ? ?Recommendations for follow up therapy are one component of a multi-disciplinary discharge planning process, led by the attending physician.  Recommendations may be updated based on patient status, additional functional criteria and insurance authorization.  ? ?Follow Up Recommendations ? Acute inpatient rehab (3hours/day)  ?  ?Assistance Recommended at Discharge Frequent or constant Supervision/Assistance  ?Patient can return home with the following A little help with walking and/or transfers;A little help with bathing/dressing/bathroom;Help with stairs or ramp for entrance;Assist for transportation;Assistance with cooking/housework;Direct supervision/assist for financial management;Direct supervision/assist for medications management ? ?  ?Functional Status Assessment ? Patient has had a recent decline in their functional status and demonstrates the ability to make significant improvements in function in a reasonable and predictable amount of time.  ?Equipment Recommendations ? Tub/shower seat  ?  ?Recommendations for Other Services   ? ? ?   ?Precautions / Restrictions Precautions ?Precautions: Fall ?Precaution Comments: orthostatic BP, L neglect/field cut ?Restrictions ?Weight Bearing Restrictions: No  ? ?  ? ?Mobility Bed Mobility ?Overal bed mobility: Needs Assistance ?Bed Mobility: Supine to Sit ?  ?Sidelying to sit: Min assist ?  ?  ?  ?  ?  ? ?Transfers ?Overall transfer level: Needs assistance ?  ?Transfers: Sit to/from Stand, Bed to chair/wheelchair/BSC ?Sit to Stand: Min assist ?  ?  ?Step pivot transfers: Min assist ?  ?  ?General transfer comment: continues with orthostasis 111/66 lying, 111/68 sitting, and 74/58 standing ?  ? ?  ?Balance Overall balance assessment: Needs assistance ?Sitting-balance support: Bilateral upper extremity supported, Feet supported ?Sitting balance-Leahy Scale: Fair ?  ?  ?Standing balance support: Bilateral upper extremity supported ?Standing balance-Leahy Scale: Poor ?  ?  ?  ?  ?  ?  ?  ?  ?  ?  ?  ?  ?   ? ?ADL either performed or assessed with clinical judgement  ? ?ADL Overall ADL's : Needs assistance/impaired ?  ?  ?Grooming: Wash/dry hands;Wash/dry face;Set up;Sitting ?  ?Upper Body Bathing: Min guard;Sitting ?  ?Lower Body Bathing: Moderate assistance;Sit to/from stand ?  ?Upper Body Dressing : Minimal assistance;Sitting ?  ?Lower Body Dressing: Moderate assistance;Sit to/from stand ?  ?Toilet Transfer: Minimal assistance;BSC/3in1;Stand-pivot ?  ?  ?  ?  ?  ?  ?   ? ? ? ?Vision Baseline Vision/History: 1 Wears glasses ?Vision Assessment?: Yes ?Tracking/Visual Pursuits: Requires cues, head turns, or add eye shifts to track;Decreased smoothness of vertical tracking;Decreased smoothness of horizontal tracking ?Visual Fields: Left visual field deficit;Impaired-to be further tested in functional context  ?   ?Perception Perception ?Perception: Impaired ?  Inattention/Neglect: Does not attend to left visual field ?  ?Praxis Praxis ?Praxis Impairment Details: Initiation ?  ? ?Pertinent Vitals/Pain Pain  Assessment ?Pain Assessment: No/denies pain  ? ? ? ?Hand Dominance Right ?  ?Extremity/Trunk Assessment Upper Extremity Assessment ?Upper Extremity Assessment: Overall WFL for tasks assessed ?  ?Lower Extremity Assessment ?Lower Extremity Assessment: Defer to PT evaluation ?  ?Cervical / Trunk Assessment ?Cervical / Trunk Assessment: Kyphotic ?  ?Communication Communication ?Communication: No difficulties ?  ?Cognition Arousal/Alertness: Awake/alert ?Behavior During Therapy: [facility] for tasks assessed/performed ?Overall Cognitive Status: Impaired/Different from baseline ?Area of Impairment: Problem solving ?  ?  ?  ?  ?  ?  ?  ?  ?  ?Current Attention Level: Sustained ?  ?Following Commands: Follows one step commands with increased time ?  ?Awareness: Emergent ?Problem Solving: Slow processing, Difficulty sequencing ?  ?  ?  ?General Comments  orthostatic bp ? ?  ?Exercises   ?  ?Shoulder Instructions    ? ? ?Home Living Family/patient expects to be discharged to:: Private residence ?Living Arrangements: Alone ?Available Help at Discharge: Family;Available 24 hours/day ?Type of Home: House ?Home Access: Stairs to enter ?Entrance Stairs-Number of Steps: 2 ?Entrance Stairs-Rails: None ?Home Layout: One level ?  ?  ?Bathroom Shower/Tub: Walk-in shower ?  ?Bathroom Toilet: Standard ?Bathroom Accessibility: Yes ?How Accessible: Accessible via walker ?Home Equipment: None ?  ?  ? Lives With: Alone ? ?  ?Prior Functioning/Environment Prior Level of Function : Independent/Modified Independent ?  ?  ?  ?  ?  ?  ?Mobility Comments: driving, yard work, mowing the yard ?ADLs Comments: independent ?  ? ?  ?  ?OT Problem List: Decreased strength;Decreased activity tolerance;Impaired balance (sitting and/or standing);Impaired vision/perception;Decreased knowledge of use of DME or AE;Decreased safety awareness;Decreased cognition ?  ?   ?OT Treatment/Interventions: Self-care/ADL training;Therapeutic activities;Therapeutic  exercise;Visual/perceptual remediation/compensation;DME and/or AE instruction;Patient/family education;Balance training  ?  ?OT Goals(Current goals can be found in the care plan section) Acute Rehab OT Goals ?Patient Stated Goal: To get back to living by myself ?OT Goal Formulation: With patient ?Time For Goal Achievement: 08/06/21 ?Potential to Achieve Goals: Good ?ADL Goals ?Pt Will Perform Grooming: with supervision;standing ?Pt Will Perform Lower Body Bathing: with supervision;sit to/from stand ?Pt Will Perform Lower Body Dressing: with supervision;sit to/from stand ?Pt Will Transfer to Toilet: with supervision;ambulating;regular height toilet ?Pt Will Perform Toileting - Clothing Manipulation and hygiene: with modified independence;sitting/lateral leans  ?OT Frequency: Min 2X/week ?  ? ?Co-evaluation   ?  ?  ?  ?  ? ?  ?AM-PAC OT "6 Clicks" Daily Activity     ?Outcome Measure Help from another person eating meals?: None ?Help from another person taking care of personal grooming?: None ?Help from another person toileting, which includes using toliet, bedpan, or urinal?: A Little ?Help from another person bathing (including washing, rinsing, drying)?: A Lot ?Help from another person to put on and taking off regular upper body clothing?: A Little ?Help from another person to put on and taking off regular lower body clothing?: A Lot ?6 Click Score: 18 ?  ?End of Session Equipment Utilized During Treatment: Rolling walker (2 wheels) ?Nurse Communication: Mobility status ? ?Activity Tolerance: Patient tolerated treatment well ?Patient left: in chair;with call bell/phone within reach;with chair alarm set;with family/visitor present ? ?OT Visit Diagnosis: Unsteadiness on feet (R26.81);Muscle weakness (generalized) (M62.81);Other symptoms and signs involving cognitive function;Low vision, both eyes (H54.2)  ?              ?  Time: 8335-8251 ?OT Time Calculation (min): 35 min ?Charges:  OT General Charges ?$OT Visit: 1  Visit ?OT Evaluation ?$OT Eval Moderate Complexity: 1 Mod ? ?07/23/2021 ? ?RP, OTR/L ? ?Acute Rehabilitation Services ? ?Office:  [phone] ? ? ?Lauren Conner ?07/23/2021, 2:19 PM ?

## 2021-07-23 NOTE — Progress Notes (Addendum)
STROKE TEAM PROGRESS NOTE   INTERVAL HISTORY Her son is at the bedside. Repeat CT stable parietal/temporal lobe hemorrhage and SAH, IVH.  Moved to 3 W. Yesterday, patient is in good spirits today.  Alert and oriented x4, neuro status remains unchanged.  MRI with contrast when creatinine improves.  Vitals:   07/23/21 0000 07/23/21 0129 07/23/21 0344 07/23/21 0745  BP: 128/74 124/76 (!) 146/83 124/81  Pulse: 73 84 83 86  Resp: 14 20 18 18   Temp: 97.7 F (36.5 C) (!) 97.4 F (36.3 C) 97.9 F (36.6 C) 97.7 F (36.5 C)  TempSrc: Oral Oral Oral Oral  SpO2: 100% 95% 98% 95%   CBC:  Recent Labs  Lab 07/22/21 0424 07/23/21 0119  WBC 8.4 9.3  HGB 7.9* 9.0*  HCT 24.0* 27.8*  MCV 94.9 96.2  PLT 193 683    Basic Metabolic Panel:  Recent Labs  Lab 07/22/21 0424 07/23/21 0119  NA 132* 133*  K 4.3 4.0  CL 105 105  CO2 18* 18*  GLUCOSE 51* 95  BUN 46* 42*  CREATININE 2.54* 2.59*  CALCIUM 7.9* 8.2*  MG 1.7  --   PHOS 4.0  --     Lipid Panel:  Recent Labs  Lab 07/23/21 0119  CHOL 157  TRIG 90  HDL 45  CHOLHDL 3.5  VLDL 18  LDLCALC 94   HgbA1c:  Recent Labs  Lab 07/23/21 0119  HGBA1C 5.1   Urine Drug Screen: No results for input(s): LABOPIA, COCAINSCRNUR, LABBENZ, AMPHETMU, THCU, LABBARB in the last 168 hours.  Alcohol Level No results for input(s): ETH in the last 168 hours.  IMAGING past 24 hours CT HEAD WO CONTRAST  Result Date: 07/22/2021 CLINICAL DATA:  Intracranial hemorrhage. EXAM: CT HEAD WITHOUT CONTRAST TECHNIQUE: Contiguous axial images were obtained from the base of the skull through the vertex without intravenous contrast. RADIATION DOSE REDUCTION: This exam was performed according to the departmental dose-optimization program which includes automated exposure control, adjustment of the mA and/or kV according to patient size and/or use of iterative reconstruction technique. COMPARISON:  CT head without contrast 07/21/2021. MR head and MRA head  07/21/2021. FINDINGS: Brain: Hemorrhage involving the right parietal and temporal lobe is not significantly changed in size, measuring 4.0 x 3.2 x 4.4 cm. Blood products have begun to lay are. Surrounding edema and mass effect is similar the prior studies. Effacement of the sulci the posterior horn of the right lateral ventricle noted. Minimal intraventricular blood products are unchanged. Subarachnoid blood along the right temporal lobe is also stable. No new hemorrhage is present. One 2 mm midline shift is stable. No significant extra-axial fluid is present on the left hemisphere. The brainstem and cerebellum are within normal limits. Vascular: Atherosclerotic calcifications are present within the cavernous internal carotid arteries bilaterally. No hyperdense vessel is present. Skull: Calvarium is intact. No focal lytic or blastic lesions are present. No significant extracranial soft tissue lesion is present. Sinuses/Orbits: Fluid is present inferior right mastoid air cells. Scratched at fluid is present the inferior mastoid air cells bilaterally. This is increased slightly. Obstructing lesions are present. The paranasal sinuses and mastoid air cells are otherwise clear. The globes and orbits are within normal limits. IMPRESSION: 1. Stable appearance of right parietal and temporal lobe hemorrhage with surrounding edema and mass effect. 2. Stable subarachnoid blood along the right temporal lobe. 3. Stable minimal intraventricular blood products. 4. No new hemorrhage. 5. Slight increase in bilateral mastoid effusions. Electronically Signed   By: Harrell Gave  Mattern M.D.   On: 07/22/2021 15:01   ECHOCARDIOGRAM COMPLETE  Result Date: 07/22/2021    ECHOCARDIOGRAM REPORT   Patient Name:   Lauren Conner Date of Exam: 07/22/2021 Medical Rec #:  [id number]   Height:       63.0 in Accession #:    [id number]  Weight:       99.9 lb Date of Birth:  [birth year]   BSA:          1.439 m Patient Age:    81 years    BP:            121/63 mmHg Patient Gender: F           HR:           97 bpm. Exam Location:  Inpatient Procedure: 2D Echo, Cardiac Doppler and Color Doppler Indications:    Stroke  History:        Patient has no prior history of Echocardiogram examinations.                 Risk Factors:Hypertension.  Sonographer:    Jyl Heinz Referring Phys: [id number] [place]  1. Left ventricular ejection fraction, by estimation, is 65 to 70%. Left ventricular ejection fraction by 2D MOD biplane is 65.2 %. The left ventricle has normal function. The left ventricle has no regional wall motion abnormalities. Left ventricular diastolic parameters are consistent with Grade I diastolic dysfunction (impaired relaxation).  2. Right ventricular systolic function is low normal. The right ventricular size is normal. There is severely elevated pulmonary artery systolic pressure. The estimated right ventricular systolic pressure is 83.6 mmHg.  3. The mitral valve is grossly normal. Trivial mitral valve regurgitation.  4. The aortic valve is tricuspid. Aortic valve regurgitation is trivial. Aortic valve sclerosis is present, with no evidence of aortic valve stenosis. Aortic regurgitation PHT measures 509 msec.  5. The inferior vena cava is dilated in size with >50% respiratory variability, suggesting right atrial pressure of 8 mmHg. Comparison(s): No prior Echocardiogram. FINDINGS  Left Ventricle: Left ventricular ejection fraction, by estimation, is 65 to 70%. Left ventricular ejection fraction by 2D MOD biplane is 65.2 %. The left ventricle has normal function. The left ventricle has no regional wall motion abnormalities. The left ventricular internal cavity size was normal in size. There is no left ventricular hypertrophy. Left ventricular diastolic parameters are consistent with Grade I diastolic dysfunction (impaired relaxation). Indeterminate filling pressures. Right Ventricle: The right ventricular size is normal. No increase in right  ventricular wall thickness. Right ventricular systolic function is low normal. There is severely elevated pulmonary artery systolic pressure. The tricuspid regurgitant velocity is 3.91 m/s, and with an assumed right atrial pressure of 8 mmHg, the estimated right ventricular systolic pressure is 62.9 mmHg. Left Atrium: Left atrial size was normal in size. Right Atrium: Right atrial size was normal in size. Pericardium: There is no evidence of pericardial effusion. Mitral Valve: The mitral valve is grossly normal. Trivial mitral valve regurgitation. Tricuspid Valve: The tricuspid valve is grossly normal. Tricuspid valve regurgitation is trivial. Aortic Valve: The aortic valve is tricuspid. Aortic valve regurgitation is trivial. Aortic regurgitation PHT measures 509 msec. Aortic valve sclerosis is present, with no evidence of aortic valve stenosis. Aortic valve peak gradient measures 7.6 mmHg. Pulmonic Valve: The pulmonic valve was grossly normal. Pulmonic valve regurgitation is not visualized. Aorta: The aortic root and ascending aorta are structurally normal, with no evidence of dilitation. Venous: The inferior vena cava  is dilated in size with greater than 50% respiratory variability, suggesting right atrial pressure of 8 mmHg. IAS/Shunts: No atrial level shunt detected by color flow Doppler.  LEFT VENTRICLE PLAX 2D                        Biplane EF (MOD) LVIDd:         4.00 cm         LV Biplane EF:   Left LVIDs:         2.50 cm                          ventricular LV PW:         1.00 cm                          ejection LV IVS:        1.00 cm                          fraction by LVOT diam:     2.00 cm                          2D MOD LV SV:         67                               biplane is LV SV Index:   47                               65.2 %. LVOT Area:     3.14 cm                                Diastology                                LV e' medial:    5.22 cm/s LV Volumes (MOD)               LV E/e' medial:   12.0 LV vol d, MOD    59.4 ml       LV e' lateral:   6.64 cm/s A2C:                           LV E/e' lateral: 9.4 LV vol d, MOD    60.8 ml A4C: LV vol s, MOD    20.3 ml A2C: LV vol s, MOD    20.7 ml A4C: LV SV MOD A2C:   39.1 ml LV SV MOD A4C:   60.8 ml LV SV MOD BP:    39.4 ml RIGHT VENTRICLE             IVC RV Basal diam:  3.30 cm     IVC diam: 2.00 cm RV Mid diam:    3.10 cm RV S prime:     10.40 cm/s TAPSE (M-mode): 2.3 cm LEFT ATRIUM             Index        RIGHT ATRIUM  Index LA diam:        3.80 cm 2.64 cm/m   RA Area:     17.20 cm LA Vol (A2C):   27.3 ml 18.97 ml/m  RA Volume:   45.10 ml  31.33 ml/m LA Vol (A4C):   44.1 ml 30.64 ml/m LA Biplane Vol: 36.8 ml 25.57 ml/m  AORTIC VALVE AV Area (Vmax): 2.62 cm AV Vmax:        138.00 cm/s AV Peak Grad:   7.6 mmHg LVOT Vmax:      115.00 cm/s LVOT Vmean:     79.400 cm/s LVOT VTI:       0.214 m AI PHT:         509 msec  AORTA Ao Root diam: 3.20 cm Ao Asc diam:  3.00 cm MITRAL VALVE               TRICUSPID VALVE MV Area (PHT): 3.68 cm    TR Peak grad:   61.2 mmHg MV Decel Time: 206 msec    TR Vmax:        391.00 cm/s MV E velocity: 62.60 cm/s MV A velocity: 83.10 cm/s  SHUNTS MV E/A ratio:  0.75        Systemic VTI:  0.21 m                            Systemic Diam: 2.00 cm Lyman Bishop MD Electronically signed by Lyman Bishop MD Signature Date/Time: 07/22/2021/10:47:58 AM    Final     PHYSICAL EXAM  Physical Exam  Constitutional: Appears well-developed and well-nourished. Cardiovascular: Normal rate and regular rhythm.  Respiratory: Effort normal, non-labored breathing  Neuro: Mental Status: Patient is awake, alert, oriented to person, place, month, year, and situation. Psychomotor slowing No signs of aphasia or neglect Cranial Nerves: II: Pupils are equal, round, and reactive to light.  Left lower quadrantanopia.  III,IV, VI: EOMI without ptosis or diploplia.  V: Facial sensation is symmetric to temperature VII: Facial movement is  symmetric resting and smiling VIII: Hearing is intact to voice X: Palate elevates symmetrically XI: Shoulder shrug is symmetric. XII: Tongue protrudes midline without atrophy or fasciculations.  Motor: Tone is normal. Bulk is normal. 5/5 strength was present in all four extremities.  Sensory: Sensation is symmetric to light touch and temperature in the arms and legs. No extinction to DSS present.  Cerebellar: FNF left side mild dysmetria  ASSESSMENT/PLAN Lauren Conner is a 81 y.o. female with history of CKD4, anemia, paroxysmal SVT, hypothyroidism, bronchiectasis, HTN presenting initially to [facility] with weakness, fever, tachycardia, BP 154/112, and confusion. Head CT then revealed a right parietal lobe ICH with mass effect and an adjacent subarachnoid hemorrhage. Plan for MRI with contrast once creatinine improves.  Per records baseline creatinine is around 1.7. CR 2.13-2.59  ICH:  Right parietal lobe ICH with adjacent SAH and trace IVH, etiology unclear, concerning for mass Code Stroke Large area of acute ICH within the RIGHT temporoparietal lobe 4.0 x 4.0 x 4.1 cm with surrounding vasogenic edema and mild effacement of the occipital horn and atrium of the RIGHT lateral ventricle. Small amount of subarachnoid hemorrhage within sulci at the anterior RIGHT temporal lobe. 3 mm of RIGHT to LEFT midline shift Repeat CT- Stable appearance of right parietal and temporal lobe hemorrhage, SAH, and IVH with surrounding edema and mass effect. MRI  25 mL intraparenchymal hematoma centered at the right temporoccipital region. No visible  underlying mass lesion or other structural abnormality on this noncontrast examination. Associated vasogenic edema with trace 3 mm of right-to-left shift. Associated small volume subarachnoid hemorrhage within the adjacent right cerebral hemisphere. Trace intraventricular hemorrhage likely related to redistribution. Small remote left cerebellar infarct. Not able to perform MRI  or CT with contrast given elevated Cre, will wait improvement of Cre  MRA   Mild atheromatous irregularity about the carotid siphons without hemodynamically significant or correctable stenosis. 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 MD to update note as needed.   Janine Ores, DNP, FNP-BC [facility] Pager: [phone]  ATTENDING NOTE: I reviewed above note and agree with the assessment and plan. Pt was seen and examined.   Son at bedside.  Patient sitting in bed, awake alert, mental status much improved, in high spirit.  Still has left lower quadrantanopsia but otherwise neuro stable.  CT repeat showed stable hematoma and cerebral edema.  Creatinine still not improving, will hold off MRI with contrast.  Once creatinine improved may consider MRI with contrast to rule out brain mass.  PT therapy recommend CIR.  Patient yesterday BP high, put on amlodipine and low-dose metoprolol.  Patient stated that 1 year ago she had orthostatic hypotension.  Today she was with PT also found to have orthostatic hypotension.  BP meds discontinued.  Continue BP monitoring.  Put on TED hose.  For detailed assessment and plan, please refer to above  as I have made changes wherever appropriate.   Rosalin Hawking, MD PhD Stroke Neurology 07/23/2021 7:08 PM     To contact Stroke Continuity provider, please refer to [email or web address] After hours, contact General Neurology

## 2021-07-24 ENCOUNTER — Inpatient Hospital Stay (HOSPITAL_COMMUNITY): Payer: Medicare Other

## 2021-07-24 DIAGNOSIS — N179 Acute kidney failure, unspecified: Secondary | ICD-10-CM

## 2021-07-24 LAB — CBC
HCT: 22.2 % — ABNORMAL LOW (ref 36.0–46.0)
Hemoglobin: 7.1 g/dL — ABNORMAL LOW (ref 12.0–15.0)
MCH: 31 pg (ref 26.0–34.0)
MCHC: 32 g/dL (ref 30.0–36.0)
MCV: 96.9 fL (ref 80.0–100.0)
Platelets: 203 10*3/uL (ref 150–400)
RBC: 2.29 MIL/uL — ABNORMAL LOW (ref 3.87–5.11)
RDW: 13.6 % (ref 11.5–15.5)
WBC: 6.5 10*3/uL (ref 4.0–10.5)
nRBC: 0 % (ref 0.0–0.2)

## 2021-07-24 LAB — URINALYSIS, ROUTINE W REFLEX MICROSCOPIC
Bilirubin Urine: NEGATIVE
Glucose, UA: NEGATIVE mg/dL
Ketones, ur: NEGATIVE mg/dL
Leukocytes,Ua: NEGATIVE
Nitrite: NEGATIVE
Protein, ur: 100 mg/dL — AB
Specific Gravity, Urine: 1.005 (ref 1.005–1.030)
pH: 5 (ref 5.0–8.0)

## 2021-07-24 LAB — BASIC METABOLIC PANEL
Anion gap: 8 (ref 5–15)
BUN: 40 mg/dL — ABNORMAL HIGH (ref 8–23)
CO2: 18 mmol/L — ABNORMAL LOW (ref 22–32)
Calcium: 7.5 mg/dL — ABNORMAL LOW (ref 8.9–10.3)
Chloride: 108 mmol/L (ref 98–111)
Creatinine, Ser: 2.78 mg/dL — ABNORMAL HIGH (ref 0.44–1.00)
GFR, Estimated: 17 mL/min — ABNORMAL LOW (ref >60)
Glucose, Bld: 109 mg/dL — ABNORMAL HIGH (ref 70–99)
Potassium: 3.8 mmol/L (ref 3.5–5.1)
Sodium: 134 mmol/L — ABNORMAL LOW (ref 135–145)

## 2021-07-24 LAB — CULTURE, BLOOD (ROUTINE X 2)
Culture: NO GROWTH
Culture: NO GROWTH
Special Requests: ADEQUATE

## 2021-07-24 LAB — PROTEIN / CREATININE RATIO, URINE
Creatinine, Urine: 40.12 mg/dL
Protein Creatinine Ratio: 1.82 mg/mg{Cre} — ABNORMAL HIGH (ref 0.00–0.15)
Total Protein, Urine: 73 mg/dL

## 2021-07-24 MED ORDER — DARBEPOETIN ALFA 200 MCG/0.4ML IJ SOSY
200.0000 ug | PREFILLED_SYRINGE | Freq: Once | INTRAMUSCULAR | Status: AC
  Administered 2021-07-24: 15:00:00 200 ug via SUBCUTANEOUS
  Filled 2021-07-24: qty 0.4

## 2021-07-24 MED ORDER — SODIUM CHLORIDE 0.9 % IV SOLN: INTRAVENOUS | Status: DC

## 2021-07-24 NOTE — PMR Pre-admission (Signed)
PMR Admission Coordinator Pre-Admission Assessment  Patient: Lauren Conner is an 81 y.o., female MRN: [id number] DOB: 10/09/1940 Height:   Weight:    Insurance Information HMO:     PPO:      PCP:      IPA:      80/20:      OTHER:  PRIMARY: Medicare A/Conner      Policy#: [id number]      Subscriber: pt CM Name:       Phone#:      Fax#:  Pre-Cert#: verified online      Employer:  Benefits:  Phone #:      Name:  Eff. Date: A/Conner 10/17/06     Deduct: $1600      Out of Pocket Max: n/a      Life Max: n/a CIR: 100%      SNF: 20 full days  Outpatient: 80%     Co-Ins: 20% Home Health: 100%      Co-Pay:  DME: 80%     Co-Ins: 20% Providers: pt choice   SECONDARY: Tricare for Life      Policy#: [id number]     Phone#:   Financial Counselor:       Phone#:   The Data Collection Information Summary for patients in Inpatient Rehabilitation Facilities with attached Privacy Act [place] Records was provided and verbally reviewed with: Patient and Family  Emergency Contact Information Contact Information     Name Relation Home Work [place] Son   [phone]   Conner,Lauren Relative   [phone]   Lauren Conner   [phone]   Lauren Conner   [phone]       Current Medical History  Patient Admitting Diagnosis: IPH   History of Present Illness: Lauren Conner is a 81 year old right-handed female with history of anemia of chronic disease paroxysmal SVT, hypothyroidism, hypertension, CKD stage IV followed by Dr.Lehrich with nephrology at [facility] with creatinine baseline 2.0.  Presented to [facility] 07/19/2021 after being found down on her left side with altered mental status and left-sided weakness.  Patient was tachycardic 124 with low-grade fever of 100.5 and blood pressure 154/121, oxygen saturations 100% room air.  Cranial CT scan showed large area of acute intraparenchymal hemorrhage within the right temporal parietal lobe 4.0 x 4.0 x 4.1 cm with surrounding  vasogenic edema and mild effacement of the occipital horn and atrium of the right lateral ventricle.  Small amount of subarachnoid hemorrhage within [place] at the anterior right temporal lobe.  3 mm of right to left midline shift.  Admission chemistry sodium 126 chloride 97 glucose 157 BUN 41 creatinine 2.13, WBC 17,100, hemoglobin 10.2, lactic acid 3.5 troponin 55-33, blood cultures no growth to date, urinalysis negative nitrite, procalcitonin 0.32, COVID-19 and influenza testing negative.  CT of the chest without contrast showed significant motion artifact bilaterally however notable cylindrical and saccular bronchiectatic changes with chronic bronchitic airway bilaterally.  CT of chest abdomen pelvis showed bilateral hydronephrosis and proximal hydroureter.  MRI/MRA of the head 25 mL intraparenchymal hematoma centered at the right temporal occipital region.  No vascular abnormality seen underlying the right cerebral hemorrhage.  Echocardiogram with ejection fraction of 65 to 70% no wall motion abnormalities grade 1 diastolic dysfunction.  Patient was placed on broad-spectrum intravenous antibiotic for suspected sepsis.  Urology services Dr. Diona Conner consulted in regards to urinary retention/significant bilateral hydronephrosis recommended Foley catheter tube and Flomax x2 weeks and follow-up outpatient.  A follow-up renal ultrasound 07/24/2021 revealed  no acute abnormality resolved bilateral hydronephrosis.  Renal services Dr. Moshe Conner consulted in regards to longstanding CKD stage IV with baseline creatinine 2.0 elevating to 2.78 felt to be related to BP variability going from very high to very low normal.  Amlodipine and metoprolol discontinued and lattest creatinine 2.44.  Her antibiotic regimen has been transition to Levaquin 500 mg every 48 hours 07/23/2021 per PCCM x14 days.  RVP negative, blood cultures negative to date.  Anemia of chronic disease A's hemoglobin 6.9 awaiting plan transfusion.  She is  tolerating a regular consistency diet.  Therapy evaluations completed and pt was recommended for CIR.    Complete NIHSS TOTAL: 0  Patient's medical record from [facility] has been reviewed by the rehabilitation admission coordinator and physician.  Past Medical History  No past medical history on file.  Has the patient had major surgery during 100 days prior to admission? No  Family History   family history is not on file.  Current Medications  Current Facility-Administered Medications:    0.9 %  sodium chloride infusion, , Intravenous, Continuous, Rai, Ripudeep K, MD, Last Rate: 100 mL/hr at 07/24/21 0123, New Bag at 07/24/21 0123   acetaminophen (TYLENOL) tablet 650 mg, 650 mg, Oral, Q4H PRN **OR** acetaminophen (TYLENOL) 160 MG/5ML solution 650 mg, 650 mg, Per Tube, Q4H PRN **OR** acetaminophen (TYLENOL) suppository 650 mg, 650 mg, Rectal, Q4H PRN, Lauren Conner, [place], MD   arformoterol (BROVANA) nebulizer solution 15 mcg, 15 mcg, Nebulization, BID, Lauren Jackson B, MD, 15 mcg at 07/24/21 0740   Chlorhexidine Gluconate Cloth 2 % PADS 6 each, 6 each, Topical, Daily, Rai, Ripudeep K, MD, 6 each at 07/23/21 1026   levofloxacin (LEVAQUIN) tablet 500 mg, 500 mg, Oral, Q48H, Lauren Conner, RPH, 500 mg at 07/23/21 1713   levothyroxine (SYNTHROID) tablet 75 mcg, 75 mcg, Oral, Q0600, Lauren Starr, MD, 75 mcg at 07/24/21 0620   ondansetron (ZOFRAN) injection 4 mg, 4 mg, Intravenous, Q8H PRN, Lauren Kindle, MD, 4 mg at 07/21/21 1857   pantoprazole (PROTONIX) EC tablet 40 mg, 40 mg, Oral, Daily, Lauren Hawking, MD, 40 mg at 07/24/21 0947   revefenacin (YUPELRI) nebulizer solution 175 mcg, 175 mcg, Nebulization, Daily, Lauren Jackson B, MD, 175 mcg at 07/24/21 0740   senna-docusate (Senokot-S) tablet 1 tablet, 1 tablet, Oral, BID, Lauren Kindle, MD, 1 tablet at 07/24/21 0947   sodium bicarbonate tablet 650 mg, 650 mg, Oral, TID, Rai, Ripudeep K, MD, 650 mg at 07/24/21 0947   tamsulosin  (FLOMAX) capsule 0.4 mg, 0.4 mg, Oral, Daily, Rai, Ripudeep K, MD, 0.4 mg at 07/24/21 0947  Patients Current Diet:  Diet Order             Diet regular Room service appropriate? Yes; Fluid consistency: Thin  Diet effective now                   Precautions / Restrictions Precautions Precautions: Fall Precaution Comments: orthostatic BP, L neglect/field cut Restrictions Weight Bearing Restrictions: No   Has the patient had 2 or more falls or a fall with injury in the past year? No  Prior Activity Level Community (5-7x/wk): very active, doing her own yard work, no DME used at baseline, driving  Prior Functional Level Self Care: Did the patient need help bathing, dressing, using the toilet or eating? Independent  Indoor Mobility: Did the patient need assistance with walking from room to room (with or without device)? Independent  Stairs: Did the patient need assistance with  internal or external stairs (with or without device)? Independent  Functional Cognition: Did the patient need help planning regular tasks such as shopping or remembering to take medications? Independent  Patient Information Are you of Hispanic, Latino/a,or Spanish origin?: A. No, not of Hispanic, Latino/a, or Spanish origin What is your race?: A. White Do you need or want an interpreter to communicate with a doctor or health care staff?: 0. No  Patient's Response To:  Health Literacy and Transportation Is the patient able to respond to health literacy and transportation needs?: Yes Health Literacy - How often do you need to have someone help you when you read instructions, pamphlets, or other written material from your doctor or pharmacy?: Never In the past 12 months, has lack of transportation kept you from medical appointments or from getting medications?: No In the past 12 months, has lack of transportation kept you from meetings, work, or from getting things needed for daily living?: No  Water quality scientist / [place] Devices/Equipment: None Home Equipment: None  Prior Device Use: Indicate devices/aids used by the patient prior to current illness, exacerbation or injury? None of the above  Current Functional Level Cognition  Arousal/Alertness: Awake/alert Overall Cognitive Status: Impaired/Different from baseline Current Attention Level: Sustained Orientation Level: Oriented X4 Following Commands: Follows one step commands with increased time Safety/Judgement: Decreased awareness of safety, Decreased awareness of deficits (L neglect) General Comments: pt initially with severe L neglect however with education and instruction to turn head to the L and follow with eyes pt demo'd some carry over Attention: Sustained Sustained Attention: Appears intact (suspect she will breakdown in function) Memory:  (TBA) Awareness: Impaired Awareness Impairment: Intellectual impairment, Anticipatory impairment Problem Solving:  (very basic intact) Safety/Judgment: Impaired    Extremity Assessment (includes Sensation/Coordination)  Upper Extremity Assessment: Overall WFL for tasks assessed  Lower Extremity Assessment: Defer to PT evaluation    ADLs  Overall ADL's : Needs assistance/impaired Grooming: Wash/dry hands, Wash/dry face, Set up, Sitting Upper Body Bathing: Min guard, Sitting Lower Body Bathing: Moderate assistance, Sit to/from stand Upper Body Dressing : Minimal assistance, Sitting Lower Body Dressing: Moderate assistance, Sit to/from stand Toilet Transfer: Minimal assistance, BSC/3in1, Stand-pivot    Mobility  Overal bed mobility: Needs Assistance Bed Mobility: Supine to Sit Rolling: Min assist Sidelying to sit: Min assist Sit to supine: Max assist (due to pt becoming orthostatic) General bed mobility comments: pt requiring minA and max verbal and tactile cues when rolling to the L as pt with L neglect and difficulty finding the rail, modA for trunk  elevation due to inability to push up with L UE due to inattention requiring max tactile cues, maxA to return to supine due to drop in bp    Transfers  Overall transfer level: Needs assistance Transfers: Sit to/from Stand, Bed to chair/wheelchair/BSC Sit to Stand: Min assist Bed to/from chair/wheelchair/BSC transfer type:: Step pivot Step pivot transfers: Min assist General transfer comment: continues with orthostasis 111/66 lying, 111/68 sitting, and 74/58 standing    Ambulation / Gait / Stairs / Wheelchair Mobility  Ambulation/Gait General Gait Details: unable this date    Posture / Balance Dynamic Sitting Balance Sitting balance - Comments: pt initially close min guard, with onset of dizziness pt leaning to the R onto therapist Balance Overall balance assessment: Needs assistance Sitting-balance support: Bilateral upper extremity supported, Feet supported Sitting balance-Leahy Scale: Fair Sitting balance - Comments: pt initially close min guard, with onset of dizziness pt leaning to the R  onto therapist Standing balance support: Bilateral upper extremity supported Standing balance-Leahy Scale: Poor    Special needs/care consideration N/a   Previous Home Environment (from acute therapy documentation) Living Arrangements: Alone  Lives With: Alone Available Help at Discharge: Family, Available 24 hours/day Type of Home: House Home Layout: One level Home Access: Stairs to enter Entrance Stairs-Rails: None Entrance Stairs-Number of Steps: 2 Bathroom Shower/Tub: Multimedia programmer: Standard Bathroom Accessibility: Yes How Accessible: Accessible via walker [place]: No  Discharge Living Setting Plans for Discharge Living Setting: Patient's home, Lives with (comment) (will either have family stay with her or go stay with family (set up for her home)) Type of Home at Discharge: House Discharge Home Layout: One level Discharge Home Access: Stairs to  enter Entrance Stairs-Rails: None Entrance Stairs-Number of Steps: 2 Discharge Bathroom Shower/Tub: Walk-in shower Discharge Bathroom Toilet: Standard Discharge Bathroom Accessibility: Yes How Accessible: Accessible via walker Does the patient have any problems obtaining your medications?: No  Social/Family/Support Systems Anticipated Caregiver: family (sons, neices, nephews, etc) Anticipated Caregiver's Contact Information: [name] (son) [phone]; [name] (son) [phone] Ability/Limitations of Caregiver: n/a Caregiver Availability: 24/7 Discharge Plan Discussed with Primary Caregiver: Yes Is Caregiver In Agreement with Plan?: Yes Does Caregiver/Family have Issues with Lodging/Transportation while Pt is in Rehab?: No  Goals Patient/Family Goal for Rehab: PT/OT/SLP supervision to mod I Expected length of stay: 8-11 days Pt/Family Agrees to Admission and willing to participate: Yes Program Orientation Provided & Reviewed with Pt/Caregiver Including Roles  & Responsibilities: Yes  Decrease burden of Care through IP rehab admission: n/a  Possible need for SNF placement upon discharge: n/a  Patient Condition: I have reviewed medical records from [facility], spoken with CSW, and patient and family member. I met with patient at the bedside for inpatient rehabilitation assessment.  Patient will benefit from ongoing PT, OT, and SLP, can actively participate in 3 hours of therapy a day 5 days of the week, and can make measurable gains during the admission.  Patient will also benefit from the coordinated team approach during an Inpatient Acute Rehabilitation admission.  The patient will receive intensive therapy as well as Rehabilitation physician, nursing, social worker, and care management interventions.  Due to bladder management, bowel management, safety, skin/wound care, disease management, medication administration, pain management, and patient education the patient requires 24 hour a day  rehabilitation nursing.  The patient is currently min assist with mobility and basic ADLs.  Discharge setting and therapy post discharge at home with home health is anticipated.  Patient has agreed to participate in the Acute Inpatient Rehabilitation Program and will admit Saturday 3/11.  Preadmission Screen Completed By:  Michel Santee, PT, DPT 07/24/2021 10:54 AM ______________________________________________________________________   Discussed status with Dr. Naaman Plummer on 07/25/21  at 11:23 AM  and received approval for admission today.  Admission Coordinator:  Michel Santee, PT, DPT time 11:23 AM Sudie Grumbling 07/25/21    Assessment/Plan: Diagnosis: IPH Does the need for close, 24 hr/day Medical supervision in concert with the patient's rehab needs make it unreasonable for this patient to be served in a less intensive setting? Yes Co-Morbidities requiring supervision/potential complications: htn, ckd iv, PSVT Due to bladder management, bowel management, safety, skin/wound care, disease management, medication administration, pain management, and patient education, does the patient require 24 hr/day rehab nursing? Yes Does the patient require coordinated care of a physician, rehab nurse, PT, OT, and SLP to address physical and functional deficits in the context of the above medical diagnosis(es)?  Yes Addressing deficits in the following areas: balance, endurance, locomotion, strength, transferring, bowel/bladder control, bathing, dressing, feeding, grooming, toileting, cognition, speech, and psychosocial support Can the patient actively participate in an intensive therapy program of at least 3 hrs of therapy 5 days a week? Yes The potential for patient to make measurable gains while on inpatient rehab is excellent Anticipated functional outcomes upon discharge from inpatient rehab: modified independent and supervision PT, modified independent and supervision OT, modified independent and supervision  SLP Estimated rehab length of stay to reach the above functional goals is: 8-11 days Anticipated discharge destination: Home 10. Overall Rehab/Functional Prognosis: excellent   MD Signature: Meredith Staggers, MD, [place] Director Rehabilitation Services 07/26/2021

## 2021-07-24 NOTE — Progress Notes (Signed)
Physical Therapy Treatment ?Patient Details ?Name: Lauren Conner ?MRN: [id number] ?DOB: Jan 15, 1941 ?Today's Date: 07/24/2021 ? ? 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 ?  ?PT Comments  ? ? Pt has made good progress from initial evaluation.  Emphasis on transition to EOB, Sitting balance/standing balance work and pre-gait activity, limited from gait from continuance of orthostatic BP issues. ?   ?Recommendations for follow up therapy are one component of a multi-disciplinary discharge planning process, led by the attending physician.  Recommendations may be updated based on patient status, additional functional criteria and insurance authorization. ? ?Follow Up Recommendations ? Acute inpatient rehab (3hours/day) ?  ?  ?Assistance Recommended at Discharge Frequent or constant Supervision/Assistance  ?Patient can return home with the following A little help with walking and/or transfers;A little help with bathing/dressing/bathroom;Assistance with cooking/housework;Assist for transportation;Help with stairs or ramp for entrance ?  ?Equipment Recommendations ? Rolling walker (2 wheels);BSC/3in1  ?  ?Recommendations for Other Services Rehab consult ? ? ?  ?Precautions / Restrictions Precautions ?Precautions: Fall ?Precaution Comments: orthostatic BP, L inattension/field cut ?Restrictions ?Weight Bearing Restrictions: No  ?  ? ?Mobility ? Bed Mobility ?  ?Bed Mobility: Supine to Sit, Sit to Supine ?  ?  ?Supine to sit: Min guard ?Sit to supine: Min guard ?  ?General bed mobility comments: min cuing for sequencing and direction.  pt not needing physical assist ?  ? ?Transfers ?Overall transfer level: Needs assistance ?  ?Transfers: Sit to/from Stand, Bed to chair/wheelchair/BSC ?Sit to Stand: Min assist ?Stand pivot transfers: Min assist ?  ?  ?  ?   ?General transfer comment: cues for hand placement, helping pt determine when to sit down.  BP in standing 2nd trial 89/63  symptomatic and significantly worse symptoms 2nd trial. ?  ? ?Ambulation/Gait ?Ambulation/Gait assistance: Min assist ?Gait Distance (Feet): 3 Feet (x2 with min A while feeling well, light mod when pt with BP symptoms.) ?Assistive device: 1 person hand held assist ?Gait Pattern/deviations: Step-to pattern ?  ?Gait velocity interpretation: <1.31 ft/sec, indicative of household ambulator ?Pre-gait activities: At bedside working on w/shift and stepping until symptomatic with low BP ?General Gait Details: mildly unsteady, tentative steps, a little wider BOS.  Pt showing most incoordination with w/shift L to move R LE. ? ? ?Stairs ?  ?  ?  ?  ?  ? ? ?Wheelchair Mobility ?  ? ?Modified Rankin (Stroke Patients Only) ?Modified Rankin (Stroke Patients Only) ?Modified Rankin: Moderately severe disability ? ? ?  ?Balance Overall balance assessment: Needs assistance ?Sitting-balance support: No upper extremity supported, Feet supported, Feet unsupported ?Sitting balance-Leahy Scale: Good ?Sitting balance - Comments: at EOB bringing each leg up to donn sock without LOB or need for self support with UE's ?  ?Standing balance support: Bilateral upper extremity supported, Single extremity supported, During functional activity ?Standing balance-Leahy Scale: Poor ?Standing balance comment: Pt lost balance completing a few BERG activities for balance. ?  ?  ?  ?  ?  ?  ?  ?  ?  ?  ?  ?  ? ?  ?Cognition Arousal/Alertness: Awake/alert ?Behavior During Therapy: [facility] for tasks assessed/performed ?Overall Cognitive Status: Impaired/Different from baseline ?  ?  ?  ?  ?  ?  ?  ?  ?  ?  ?Current Attention Level: Selective ?  ?  Following Commands: Follows one step commands consistently, Follows one step commands with increased time ?Safety/Judgement: Decreased awareness of safety, Decreased awareness of  deficits ?Awareness: Emergent ?Problem Solving: Slow processing ?General Comments: inattension improved ?  ?  ? ?  ?Exercises   ? ?  ?General Comments General comments (skin integrity, edema, etc.): pt still orthostatic.  Standing reading 2nd trial 89/63, down from 123/70's in supine. ?  ?  ? ?Pertinent Vitals/Pain Pain Assessment ?Pain Assessment: Faces ?Pain Intervention(s): Monitored during session  ? ? ?Home Living   ?  ?  ?  ?  ?  ?  ?  ?  ?  ?   ?  ?Prior Function    ?  ?  ?   ? ?PT Goals (current goals can now be found in the care plan section) Acute Rehab PT Goals ?Patient Stated Goal: get better ?PT Goal Formulation: With patient/family ?Time For Goal Achievement: 08/06/21 ?Potential to Achieve Goals: Good ?Progress towards PT goals: Progressing toward goals ? ?  ?Frequency ? ? ? Min 4X/week ? ? ? ?  ?PT Plan Current plan remains appropriate  ? ? ?Co-evaluation   ?  ?  ?  ?  ? ?  ?AM-PAC PT "6 Clicks" Mobility   ?Outcome Measure ? Help needed turning from your back to your side while in a flat bed without using bedrails?: A Little ?Help needed moving from lying on your back to sitting on the side of a flat bed without using bedrails?: A Little ?Help needed moving to and from a bed to a chair (including a wheelchair)?: A Lot ?Help needed standing up from a chair using your arms (e.g., wheelchair or bedside chair)?: A Little ?Help needed to walk in hospital room?: A Lot ?Help needed climbing 3-5 steps with a railing? : A Lot ?6 Click Score: 15 ? ?  ?End of Session   ?  ?Patient left: in bed;with call bell/phone within reach;with bed alarm set;with family/visitor present ?Nurse Communication: Mobility status ?PT Visit Diagnosis: Unsteadiness on feet (R26.81);Other symptoms and signs involving the nervous system (R29.898);Difficulty in walking, not elsewhere classified (R26.2) ?  ? ? ?Time: 5643-3295 ?PT Time Calculation (min) (ACUTE ONLY): 28 min ? ?Charges:  $Therapeutic Activity: 8-22 mins ?$Neuromuscular  Re-education: 8-22 mins          ?          ? ?07/24/2021 ? ?Ginger Carne., PT ?Acute Rehabilitation Services ?[phone]  (pager) ?[phone]  (office) ? ? ?Tessie Fass Antwon Rochin ?07/24/2021, 12:00 PM ? ?

## 2021-07-24 NOTE — Care Management Important Message (Signed)
Important Message ? ?Patient Details  ?Name: Lauren Conner ?MRN: [id number] ?Date of Birth: 08/22/1940 ? ? ?Medicare Important Message Given:  Yes ? ? ? ? ?Tomeeka Plaugher ?07/24/2021, 1:22 PM ?

## 2021-07-24 NOTE — Progress Notes (Addendum)
Speech Language Pathology Treatment: Dysphagia ; Cognitive-linguistic  ?Patient Details ?Name: Lauren Conner ?MRN: [id number] ?DOB: 05-Aug-1940 ?Today's Date: 07/24/2021 ?Time: 7544-9201 ?SLP Time Calculation (min) (ACUTE ONLY): 20 min ? ?Assessment / Plan / Recommendation ?Clinical Impression ? Pt was seen for treatment with her niece present. She reported that she been tolerating the current diet without significant difficulty, but still clears her throat intermittently. Throat clearing was inconsistently noted across consistencies. The potential impact of the hiatal hernia was discussed, but it was agreed that a modified barium swallow study will be continued to rule out pharyngeal dysfunction. Pt was oriented x4 and demonstrated 100% accuracy with problem solving related to safety. Pt required significant support for accurate completion of time management problems. Accuracy was demonstrated after multiple repetitions, and prompts for reasoning. Pt expressed that her performance was surprising and that she would not have had this level of difficulty PTA. MBS is scheduled for today at 1230 and SLP will continue to follow pt.   ?  ?HPI HPI: 81 yo F with end-stage renal failure with CKD, cardiac dysfunction with arrhythmia and SVT, hypothyroidism, moderate to severe protein calorie malnutrition, chronic bronchiectasis, She was admitted with altered mental status and confusion. She was found to have leukocytosis with elevated lactate and metabolic acidosis. Chest CT with cylindrical and saccular bronchiectatic changes with chronic bronchitic airways bilaterally with mucoid impaction and bibasilar fibrotic changes and large hiatal hernia. MRI 25 mL intraparenchymal hematoma centered at the right temporoccipital region. associated small volume subarachnoid hemorrhage within the adjacent right cerebral hemisphere, underlying age-related cerebral atrophy with moderately advanced  chronic microvascular ischemic disease, small  remote left cerebellar infarct ?  ?   ?SLP Plan ? Continue with current plan of care ? ?  ?  ?Recommendations for follow up therapy are one component of a multi-disciplinary discharge planning process, led by the attending physician.  Recommendations may be updated based on patient status, additional functional criteria and insurance authorization. ?  ? ?Recommendations  ?Diet recommendations: Regular;Thin liquid ?Liquids provided via: Cup;Straw ?Medication Administration: Whole meds with liquid ?Supervision: Patient able to self feed ?Compensations: Minimize environmental distractions;Slow rate;Small sips/bites ?Postural Changes and/or Swallow Maneuvers: Seated upright 90 degrees  ?   ?    ?   ? ? ? ? Oral Care Recommendations: Oral care BID ?Follow Up Recommendations: Acute inpatient rehab (3hours/day) ?Assistance recommended at discharge: Frequent or constant Supervision/Assistance ?SLP Visit Diagnosis: Dysphagia, unspecified (R13.10); cognitive-communication disorder ?Plan: Continue with current plan of care ? ? ? ? ?  ?  ?Braleigh Massoud I. Hardin Negus, [place], CCC-SLP ?Acute Rehabilitation Services ?Office number [phone] ?Pager [phone] ? ? ?Lauren Conner ? ?07/24/2021, 9:27 AM ? ? ?

## 2021-07-24 NOTE — Plan of Care (Signed)
?  Problem: Education: ?Goal: Knowledge of disease or condition will improve ?Outcome: Progressing ?Goal: Knowledge of secondary prevention will improve (SELECT ALL) ?Outcome: Progressing ?Goal: Knowledge of patient specific risk factors will improve (INDIVIDUALIZE FOR PATIENT) ?Outcome: Progressing ?  ?Problem: Safety: ?Goal: Ability to remain free from injury will improve ?Outcome: Progressing ?  ?

## 2021-07-24 NOTE — Progress Notes (Signed)
Modified Barium Swallow Progress Note ? ?Patient Details  ?Name: Lauren Conner ?MRN: [id number] ?Date of Birth: 1941-04-15 ? ?Today's Date: 07/24/2021 ? ?Modified Barium Swallow completed.  Full report located under Chart Review in the Imaging Section. ? ?Brief recommendations include the following: ? ?Clinical Impression ? Pt was seen in radiology suite for modified barium swallow study. Trials of puree solids, regular texture solids, a 65mm barium tablet, and thin liquids via cup and straw were administered. Pt presented with mild pharyngoesophageal dysphagia characterized by reduction in pharyngeal constriction, and PES distension. A CP bar was intermittently noted and residue was noted at the level of the PES. Solid residue was cleared with a liquid wash and liquid residue was reduced with secondary swallows. It is recommended that the pt's current diet of regular solids and thin liquids be continued at this time. SLP will see patient once more for education; however, it is anticipated that further skilled SLP services for swallowing will likely not be needed beyond that. ?  ?Swallow Evaluation Recommendations ? ?   ? ? SLP Diet Recommendations: Regular solids;Thin liquid ? ? Liquid Administration via: Cup;Straw ? ? Medication Administration: Whole meds with liquid ? ? Supervision: Patient able to self feed ? ? Compensations: Small sips/bites;Slow rate ? ? Postural Changes: Seated upright at 90 degrees ? ? Oral Care Recommendations: Oral care BID ? ?   ? ?Hiilani Jetter I. Hardin Negus, [place], CCC-SLP ?Acute Rehabilitation Services ?Office number [phone] ?Pager [phone] ? ? ?Lauren Conner ?07/24/2021,2:01 PM ? ?  ? ?

## 2021-07-24 NOTE — Progress Notes (Signed)
[facility]                                                                              Patient Demographics  Lauren Conner, is a 81 y.o. female, DOB - 06-Jul-1940, AYT:[id number]  Admit date - 07/21/2021   Admitting Physician Lauren Kindle, MD  Outpatient Primary MD for the patient is Lauren Conner  Outpatient specialists:   LOS - 3  days   Medical records reviewed and are as summarized below:          Brief summary   Patient is a 81 year old female with history of SVT, HTN, bronchiectasis, MAI infection followed at [facility] and completed 2 years of Zithromax and rifampin, CKD stage IV initially presented to [facility] after a fall with altered mental status.  Per family she is normally mobile and very independent.  Family found her confused, on the floor with bruising to the face and was brought to ED.  Patient was found to be encephalopathic, febrile.  Chest x-ray with multifocal opacities.  She was admitted, remained encephalopathic hence CT head was obtained which showed large right temporoparietal IPH with 3 mm midline shift and vasogenic edema.  Patient was transferred to [facility] for further management. 3/5 presented to [facility], admitted to [facility] 3/6 txfr to [facility] for 3% after [place] showed large IPH with edema and midline shift 3/7 Looks better this morning, awake and passed swallow  3/8 TRH assumed Conner at [place] Problem: Acute encephalopathy, acute right temporoparietal IPH in the setting of fall -Possibly hypertensive although may be traumatic.  Neurology following. -MRI/MRA without contrast do not suggest underlying vascular abnormality. -Repeat CT head 3/7 showed stable appearance of right parietal and temporal lobe hemorrhage with surrounding edema and mass effect.  Stable subarachnoid blood along the right temporal lobe. -Not able to perform MRI or CT with contrast due to elevated creatinine -MRA showed mild atheromatous irregularity  about the carotid systems without hemodynamically significant or correctable stenosis.  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 -2D echo showed EF of 65 to 70% -Currently alert and oriented, at baseline mental status, confirmed with son at the bedside -PT OT evaluation-> orthostatic positive, severe dizziness, recommended rehab consult-> CIR consult placed  Active Problems:   Pneumonia of both lungs due to infectious organism, in the setting of chronic bronchiectasis -Followed at [facility], currently not on chronic antibiotics -Patient followed by PCCM, recommended Levaquin for 14 days for likely CAP with possible bronchiectasis exacerbation -RVP negative, blood cultures negative to date -Continue Yupelri and Brovana nebs.  Recommended to stop outpatient inhaled steroids in the setting of bronchiectasis     Acute urinary retention with significant bilateral hydronephrosis -Patient was found to have acute kidney injury on CKD stage IV, UA negative for UTI - CT abdomen pelvis showed marked severity bilateral hydronephrosis, proximal hydroureter in the setting of markedly distended urinary bladder -Foley catheter was placed, negative balance of 1.7 L -Continue Flomax -Discussed with urology on-call, Dr. Diona Conner, on 3/8 recommended continue Foley catheter and Flomax.  Patient will need at least 2  weeks, recommended follow-up in the office for voiding trial  Acute kidney injury on CKD stage IV with NAG metabolic acidosis -Follows nephrology at [facility], baseline creatinine around 2 (BUN 40, creatinine 2.1 per labs on 07/04/2021 - Lauren Conner) -Creatinine 2.1 on 07/19/2021, however creatinine has continued to trend up, 2.7 today -Nephrology consulted, will follow recommendations  Hypothyroidism -Continue Synthroid   Moderate protein calorie malnutrition Estimated body mass index is 17.69 kg/m as calculated from the following:   Height as of 07/19/21: 5\' 3"  (1.6 m).   Weight as of 07/19/21:  45.3 kg.  Code Status: Full CODE STATUS DVT Prophylaxis:  Place TED hose Start: 07/23/21 1309 SCD's Start: 07/21/21 1713   Level of Conner: Level of Conner: Progressive Family Communication: Discussed all imaging results, lab results, explained to the patient's niece at the bedside   Disposition Plan:     Status is: Inpatient Remains inpatient appropriate because: Work-up in progress, not safe for discharge.    Time Spent in minutes 45 minutes Procedures:  MRI brain  Consultants:   PCCM Neurology Nephrology  Antimicrobials:  Levaquin 500 mg daily 07/21/2021--->     Medications  Scheduled Meds:  arformoterol  15 mcg Nebulization BID   Chlorhexidine Gluconate Cloth  6 each Topical Daily   levofloxacin  500 mg Oral Q48H   levothyroxine  75 mcg Oral Q0600   pantoprazole  40 mg Oral Daily   revefenacin  175 mcg Nebulization Daily   senna-docusate  1 tablet Oral BID   sodium bicarbonate  650 mg Oral TID   tamsulosin  0.4 mg Oral Daily   Continuous Infusions:  sodium chloride 100 mL/hr at 07/24/21 1534   PRN Meds:.acetaminophen **OR** acetaminophen (TYLENOL) oral liquid 160 mg/5 mL **OR** acetaminophen, ondansetron (ZOFRAN) IV      Subjective:   Lauren Conner was seen and examined today.  Somewhat anxious today however overall she has been improving since admission.  Niece at the bedside.  No fevers or chills, no nausea vomiting or diarrhea.  Objective:   Vitals:   07/23/21 2328 07/24/21 0417 07/24/21 0740 07/24/21 1154  BP: (!) 102/53 (!) 109/51  131/79  Pulse: 85 86  87  Resp: 15 15  16   Temp: 98.2 F (36.8 C) 98.8 F (37.1 C)  (!) 97.5 F (36.4 C)  TempSrc: Oral Oral  Oral  SpO2: 94% 98% 97% 97%    Intake/Output Summary (Last 24 hours) at 07/24/2021 1551 Last data filed at 07/24/2021 0950 Gross per 24 hour  Intake 701.88 ml  Output --  Net 701.88 ml     Wt Readings from Last 3 Encounters:  07/19/21 45.3 kg    Physical Exam General: Alert and oriented  x 3, NAD Cardiovascular: S1 S2 clear, RRR. No pedal edema b/l Respiratory: CTAB, no wheezing, rales or rhonchi Gastrointestinal: Soft, nontender, nondistended, NBS Ext: no pedal edema bilaterally Neuro: no new deficits Skin: No rashes Psych: Normal affect and demeanor, alert and oriented x3   Data Reviewed:  I have personally reviewed following labs and imaging studies  Micro Results Recent Results (from the past 240 hour(s))  Resp Panel by RT-PCR (Flu A&B, Covid) Nasopharyngeal Swab     Status: None   Collection Time: 07/19/21  9:17 PM   Specimen: Nasopharyngeal Swab; Nasopharyngeal(NP) swabs in vial transport medium  Result Value Ref Range Status   SARS Coronavirus 2 by RT PCR NEGATIVE NEGATIVE Final    Comment: (NOTE) SARS-CoV-2 target nucleic acids are NOT DETECTED.  The SARS-CoV-2  RNA is generally detectable in upper respiratory specimens during the acute phase of infection. The lowest concentration of SARS-CoV-2 viral copies this assay can detect is 138 copies/mL. A negative result does not preclude SARS-Cov-2 infection and should not be used as the sole basis for treatment or other patient management decisions. A negative result may occur with  improper specimen collection/handling, submission of specimen other than nasopharyngeal swab, presence of viral mutation(s) within the areas targeted by this assay, and inadequate number of viral copies(<138 copies/mL). A negative result must be combined with clinical observations, patient history, and epidemiological information. The expected result is Negative.  Fact Sheet for Patients:  EntrepreneurPulse.com.au  Fact Sheet for Healthcare Providers:  IncredibleEmployment.be  This test is no t yet approved or cleared by the [place] FDA and  has been authorized for detection and/or diagnosis of SARS-CoV-2 by FDA under an Emergency Use Authorization (EUA). This EUA will remain  in effect  (meaning this test can be used) for the duration of the COVID-19 declaration under Section 564(b)(1) of the Act, 21 U.S.C.section 360bbb-3(b)(1), unless the authorization is terminated  or revoked sooner.       Influenza A by PCR NEGATIVE NEGATIVE Final   Influenza B by PCR NEGATIVE NEGATIVE Final    Comment: (NOTE) The Xpert Xpress SARS-CoV-2/FLU/RSV plus assay is intended as an aid in the diagnosis of influenza from Nasopharyngeal swab specimens and should not be used as a sole basis for treatment. Nasal washings and aspirates are unacceptable for Xpert Xpress SARS-CoV-2/FLU/RSV testing.  Fact Sheet for Patients: EntrepreneurPulse.com.au  Fact Sheet for Healthcare Providers: IncredibleEmployment.be  This test is not yet approved or cleared by the [place] FDA and has been authorized for detection and/or diagnosis of SARS-CoV-2 by FDA under an Emergency Use Authorization (EUA). This EUA will remain in effect (meaning this test can be used) for the duration of the COVID-19 declaration under Section 564(b)(1) of the Act, 21 U.S.C. section 360bbb-3(b)(1), unless the authorization is terminated or revoked.  Performed at [facility], [address] [place], [place] [address]   Culture, blood (Routine x 2)     Status: None   Collection Time: 07/19/21  9:23 PM   Specimen: BLOOD  Result Value Ref Range Status   Specimen Description BLOOD RIGHT ANTECUBITAL  Final   Special Requests   Final    BOTTLES DRAWN AEROBIC AND ANAEROBIC Blood Culture adequate volume   Culture   Final    NO GROWTH 5 DAYS Performed at [facility], [address] [place], [place] [address]    Report Status 07/24/2021 FINAL  Final  Culture, blood (Routine x 2)     Status: None   Collection Time: 07/19/21  9:28 PM   Specimen: BLOOD  Result Value Ref Range Status   Specimen Description BLOOD BLOOD LEFT FOREARM  Final   Special Requests   Final     BOTTLES DRAWN AEROBIC AND ANAEROBIC Blood Culture results may not be optimal due to an excessive volume of blood received in culture bottles   Culture   Final    NO GROWTH 5 DAYS Performed at [facility], [address], [place], [place] [address]    Report Status 07/24/2021 FINAL  Final  Urine Culture     Status: None   Collection Time: 07/20/21  7:45 AM   Specimen: Urine, Clean Catch  Result Value Ref Range Status   Specimen Description   Final    URINE, CLEAN CATCH  Performed at [facility], [address], [place], [place] [address]    Special Requests   Final    NONE Performed at [facility], [address] [place], [place] [address]    Culture   Final    NO GROWTH Performed at [facility], [place] [address] [place], [place] [address]    Report Status 07/21/2021 FINAL  Final  Respiratory (~20 pathogens) panel by PCR     Status: None   Collection Time: 07/20/21  8:15 AM   Specimen: Nasopharyngeal Swab; Respiratory  Result Value Ref Range Status   Adenovirus NOT DETECTED NOT DETECTED Final   Coronavirus 229E NOT DETECTED NOT DETECTED Final    Comment: (NOTE) The Coronavirus on the Respiratory Panel, DOES NOT test for the novel  Coronavirus (2019 nCoV)    Coronavirus HKU1 NOT DETECTED NOT DETECTED Final   Coronavirus NL63 NOT DETECTED NOT DETECTED Final   Coronavirus OC43 NOT DETECTED NOT DETECTED Final   Metapneumovirus NOT DETECTED NOT DETECTED Final   Rhinovirus / Enterovirus NOT DETECTED NOT DETECTED Final   Influenza A NOT DETECTED NOT DETECTED Final   Influenza B NOT DETECTED NOT DETECTED Final   Parainfluenza Virus 1 NOT DETECTED NOT DETECTED Final   Parainfluenza Virus 2 NOT DETECTED NOT DETECTED Final   Parainfluenza Virus 3 NOT DETECTED NOT DETECTED Final   Parainfluenza Virus 4 NOT DETECTED NOT DETECTED Final   Respiratory Syncytial Virus NOT DETECTED NOT DETECTED Final   Bordetella pertussis NOT DETECTED NOT DETECTED  Final   Bordetella Parapertussis NOT DETECTED NOT DETECTED Final   Chlamydophila pneumoniae NOT DETECTED NOT DETECTED Final   Mycoplasma pneumoniae NOT DETECTED NOT DETECTED Final    Comment: Performed at [facility], [place] [address] [place], [place] [address]  MRSA Next Gen by PCR, Nasal     Status: None   Collection Time: 07/20/21  8:15 AM   Specimen: Nasopharyngeal Swab; Nasal Swab  Result Value Ref Range Status   MRSA by PCR Next Gen NOT DETECTED NOT DETECTED Final    Comment: (NOTE) The GeneXpert MRSA Assay (FDA approved for NASAL specimens only), is one component of a comprehensive MRSA colonization surveillance program. It is not intended to diagnose MRSA infection nor to guide or monitor treatment for MRSA infections. Test performance is not FDA approved in patients less than 2 years old. Performed at [facility], [address] [place], [place] [address]   MRSA Next Gen by PCR, Nasal     Status: None   Collection Time: 07/21/21  5:44 PM   Specimen: Nasal Mucosa; Nasal Swab  Result Value Ref Range Status   MRSA by PCR Next Gen NOT DETECTED NOT DETECTED Final    Comment: (NOTE) The GeneXpert MRSA Assay (FDA approved for NASAL specimens only), is one component of a comprehensive MRSA colonization surveillance program. It is not intended to diagnose MRSA infection nor to guide or monitor treatment for MRSA infections. Test performance is not FDA approved in patients less than 84 years old. Performed at [facility], [place] [address], [place], [place] [address]     Radiology Reports CT ABDOMEN PELVIS WO CONTRAST  Result Date: 07/20/2021 CLINICAL DATA:  Altered mental status with fever, tachycardia and tachypnea. EXAM: CT ABDOMEN AND PELVIS WITHOUT CONTRAST TECHNIQUE: Multidetector CT imaging of the abdomen and pelvis was performed following the standard protocol without IV contrast. RADIATION DOSE REDUCTION: This exam was performed according to the  departmental  dose-optimization program which includes automated exposure control, adjustment of the mA and/or kV according to patient size and/or use of iterative reconstruction technique. COMPARISON:  None. FINDINGS: Lower chest: The study is markedly limited secondary to patient motion. Marked severity multifocal infiltrates and extensive bronchiectasis are seen throughout both lung bases. Hepatobiliary: 13 mm and 18 mm diameter cysts are seen within the anterior aspect of the right lobe of the liver. A 10 mm cystic appearing area is seen within the inferior aspect of the right lobe. There is mild central intrahepatic biliary dilatation. The gallbladder is mildly distended without evidence of gallstones, gallbladder wall thickening or pericholecystic fluid. Pancreas: Unremarkable. No pancreatic ductal dilatation or surrounding inflammatory changes. Spleen: Punctate calcified granulomas are seen scattered throughout the parenchyma of a small spleen. Adrenals/Urinary Tract: Adrenal glands are unremarkable. Kidneys are normal in size. A 2.0 cm diameter cyst is seen along the posterior aspect of the mid left kidney. There is marked severity bilateral hydronephrosis and proximal hydroureter without obstructing renal calculi. Bilateral nonspecific perinephric inflammatory fat stranding is seen. The urinary bladder is markedly distended. Stomach/Bowel: There is a large hiatal hernia. Appendix appears normal. No evidence of bowel wall thickening, distention, or inflammatory changes. Noninflamed diverticula are seen throughout the large bowel. Vascular/Lymphatic: Mild aortic atherosclerosis. No enlarged abdominal or pelvic lymph nodes. Reproductive: Uterus and bilateral adnexa are unremarkable. Other: No abdominal wall hernia or abnormality. No abdominopelvic ascites. Musculoskeletal: Marked severity multilevel degenerative changes are seen throughout the lumbar spine. IMPRESSION: 1. Marked severity bibasilar multifocal  infiltrates. 2. Marked severity bilateral hydronephrosis and proximal hydroureter in the setting of a markedly distended urinary bladder. 3. Large hiatal hernia. 4. Colonic diverticulosis. 5. Marked severity multilevel degenerative changes throughout the lumbar spine. 6. Aortic atherosclerosis. Aortic Atherosclerosis (ICD10-I70.0). Electronically Signed   By: Virgina Norfolk M.D.   On: 07/20/2021 02:02   CT HEAD WO CONTRAST  Result Date: 07/22/2021 CLINICAL DATA:  Intracranial hemorrhage. EXAM: CT HEAD WITHOUT CONTRAST TECHNIQUE: Contiguous axial images were obtained from the base of the skull through the vertex without intravenous contrast. RADIATION DOSE REDUCTION: This exam was performed according to the departmental dose-optimization program which includes automated exposure control, adjustment of the mA and/or kV according to patient size and/or use of iterative reconstruction technique. COMPARISON:  CT head without contrast 07/21/2021. MR head and MRA head 07/21/2021. FINDINGS: Brain: Hemorrhage involving the right parietal and temporal lobe is not significantly changed in size, measuring 4.0 x 3.2 x 4.4 cm. Blood products have begun to lay are. Surrounding edema and mass effect is similar the prior studies. Effacement of the sulci the posterior horn of the right lateral ventricle noted. Minimal intraventricular blood products are unchanged. Subarachnoid blood along the right temporal lobe is also stable. No new hemorrhage is present. One 2 mm midline shift is stable. No significant extra-axial fluid is present on the left hemisphere. The brainstem and cerebellum are within normal limits. Vascular: Atherosclerotic calcifications are present within the cavernous internal carotid arteries bilaterally. No hyperdense vessel is present. Skull: Calvarium is intact. No focal lytic or blastic lesions are present. No significant extracranial soft tissue lesion is present. Sinuses/Orbits: Fluid is present inferior  right mastoid air cells. Scratched at fluid is present the inferior mastoid air cells bilaterally. This is increased slightly. Obstructing lesions are present. The paranasal sinuses and mastoid air cells are otherwise clear. The globes and orbits are within normal limits. IMPRESSION: 1. Stable appearance of right parietal and temporal lobe hemorrhage with surrounding edema  and mass effect. 2. Stable subarachnoid blood along the right temporal lobe. 3. Stable minimal intraventricular blood products. 4. No new hemorrhage. 5. Slight increase in bilateral mastoid effusions. Electronically Signed   By: San Morelle M.D.   On: 07/22/2021 15:01   CT HEAD WO CONTRAST (5MM)  Result Date: 07/21/2021 CLINICAL DATA:  Tension-type headache EXAM: CT HEAD WITHOUT CONTRAST TECHNIQUE: Contiguous axial images were obtained from the base of the skull through the vertex without intravenous contrast. RADIATION DOSE REDUCTION: This exam was performed according to the departmental dose-optimization program which includes automated exposure control, adjustment of the mA and/or kV according to patient size and/or use of iterative reconstruction technique. COMPARISON:  None FINDINGS: Brain: Generalized atrophy. Mild effacement of the occipital horn and atrium of the RIGHT lateral ventricle. Remaining ventricular system normal appearance. Large area of acute intraparenchymal hemorrhage identified within the RIGHT temporoparietal lobe 4.0 x 4.0 x 4.1 cm with surrounding vasogenic edema. Additionally, small amount of subarachnoid hemorrhage is seen within sulci at the anterior RIGHT temporal lobe. No intraventricular extension of hemorrhage. 3 mm of RIGHT to LEFT midline shift. Underlying small vessel chronic ischemic changes of deep cerebral white matter. No focal mass identified. Vascular: Atherosclerotic calcification of internal carotid arteries at skull base Skull: Intact Sinuses/Orbits: Clear Other: N/A IMPRESSION: Large area  of acute intraparenchymal hemorrhage within the RIGHT temporoparietal lobe 4.0 x 4.0 x 4.1 cm with surrounding vasogenic edema and mild effacement of the occipital horn and atrium of the RIGHT lateral ventricle. Small amount of subarachnoid hemorrhage within sulci at the anterior RIGHT temporal lobe. 3 mm of RIGHT to LEFT midline shift. Underlying small vessel chronic ischemic changes of deep cerebral white matter. Critical Value/emergent results were called by telephone at the time of interpretation on 07/21/2021 at 12:56 pm to provider Laurey Arrow MD, who verbally acknowledged these results. Electronically Signed   By: Lavonia Dana M.D.   On: 07/21/2021 12:57   CT CHEST WO CONTRAST  Result Date: 07/20/2021 CLINICAL DATA:  81 year old female with history of community-acquired pneumonia. EXAM: CT CHEST WITHOUT CONTRAST TECHNIQUE: Multidetector CT imaging of the chest was performed following the standard protocol without IV contrast. RADIATION DOSE REDUCTION: This exam was performed according to the departmental dose-optimization program which includes automated exposure control, adjustment of the mA and/or kV according to patient size and/or use of iterative reconstruction technique. COMPARISON:  No priors. FINDINGS: Cardiovascular: Heart size is normal. There is no significant pericardial fluid, thickening or pericardial calcification. Aortic atherosclerosis. No coronary artery calcifications. Mediastinum/Nodes: No pathologically enlarged mediastinal or hilar lymph nodes. Please note that accurate exclusion of hilar adenopathy is limited on noncontrast CT scans. Large hiatal hernia. No axillary lymphadenopathy. Lungs/Pleura: Study is limited by considerable patient respiratory motion. With these limitations in mind, there are widespread areas of cylindrical, varicose and occasional cystic bronchiectasis, with extensive thickening of the peribronchovascular interstitium, regional areas of architectural distortion, and  extensive areas of peribronchovascular micro and macronodularity, most compatible with infected areas of mucoid impaction. No pleural effusions. Upper Abdomen: Aortic atherosclerosis. Musculoskeletal: There are no aggressive appearing lytic or blastic lesions noted in the visualized portions of the skeleton. IMPRESSION: 1. 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 Given the widespread micro and macronodularity, acute superinfection is not excluded. 2. Large hiatal hernia. 3. Aortic atherosclerosis. Aortic Atherosclerosis (ICD10-I70.0). Electronically Signed   By: Vinnie Langton M.D.   On: 07/20/2021 09:11  MR ANGIO HEAD WO CONTRAST  Result Date: 07/21/2021 CLINICAL DATA:  Initial evaluation for altered mental status. EXAM: MRI HEAD WITHOUT CONTRAST MRA HEAD WITHOUT CONTRAST TECHNIQUE: Multiplanar, multi-echo pulse sequences of the brain and surrounding structures were acquired without intravenous contrast. Angiographic images of the Circle of Willis were acquired using MRA technique without intravenous contrast. COMPARISON:  Prior CT from earlier the same day. FINDINGS: MRI HEAD FINDINGS Brain: Generalized age-related cerebral atrophy. Patchy and confluent T2/FLAIR hyperintensity involving the periventricular deep white matter both cerebral hemispheres as well as the pons, consistent with chronic small vessel ischemic disease, moderately advanced in nature. Small remote left cerebellar infarct noted. Previously identified intraparenchymal hematoma centered at the left temporal occipital region again seen. Hemorrhage measures approximately 3.7 x 3.7 x 3.7 cm (estimated volume 25 mL). No visible underlying mass lesion or other structural abnormality on this noncontrast examination. Associated small volume subarachnoid hemorrhage within the adjacent right temporal region. Surrounding vasogenic edema with  partial mass effect on the atrium of the right lateral ventricle and trace 3 mm right-to-left shift. No hydrocephalus or trapping. Basilar cisterns remain patent. Trace intraventricular hemorrhage noted as well, likely related to redistribution. No other evidence for acute intracranial hemorrhage. No acute or subacute infarct elsewhere within the brain. Gray-white matter differentiation otherwise maintained. No other foci of susceptibility artifact to suggest acute or chronic intracranial blood products. No mass lesion or extra-axial fluid collection. Pituitary gland suprasellar region normal. Midline structures intact. Vascular: Major intracranial vascular flow voids are well maintained. Skull and upper cervical spine: Craniocervical junction within normal limits. Bone marrow signal intensity normal. No scalp soft tissue abnormality. Sinuses/Orbits: Globes orbital soft tissues within normal limits. Mild mucosal thickening noted within the ethmoidal air cells. Paranasal sinuses are otherwise clear. Small bilateral mastoid effusions noted. Visualized nasopharynx unremarkable. Other: None. MRA HEAD FINDINGS Anterior circulation: Examination degraded by motion artifact and positioning. Visualized distal cervical segments of the internal carotid arteries are patent with antegrade flow. Petrous segments patent bilaterally. Atheromatous irregularity seen within the carotid siphons without hemodynamically significant stenosis. Irregular 5 mm focal outpouching extending laterally from the cavernous right ICA consistent with a small aneurysm (series 19, image 57). A1 segments patent bilaterally. Normal anterior communicating artery complex. Both anterior cerebral arteries grossly patent to their distal aspects without significant stenosis. No M1 stenosis or occlusion. Normal MCA bifurcations. Distal MCA branches well perfused and fairly symmetric. Posterior circulation: Visualized distal V4 segments patent without stenosis.  Right vertebral artery slightly dominant. Basilar patent to its distal aspect without stenosis. Superior cerebellar arteries patent bilaterally. Both PCAs primarily supplied via the basilar well perfused or distal aspects. Anatomic variants: None significant. No vascular abnormality seen underlying the right cerebral hemorrhage. IMPRESSION: MRI HEAD IMPRESSION: 1. 25 mL intraparenchymal hematoma centered at the right temporoccipital region. No visible underlying mass lesion or other structural abnormality on this noncontrast examination. Associated vasogenic edema with trace 3 mm of right-to-left shift. 2. Associated small volume subarachnoid hemorrhage within the adjacent right cerebral hemisphere. Trace intraventricular hemorrhage likely related to redistribution. No hydrocephalus or trapping. 3. Underlying age-related cerebral atrophy with moderately advanced chronic microvascular ischemic disease. 4. Small remote left cerebellar infarct. MRA HEAD IMPRESSION: 1. No vascular abnormality seen underlying the right cerebral hemorrhage. Negative intracranial MRA 2. For large vessel occlusion. Mild atheromatous irregularity about the carotid siphons without hemodynamically significant or correctable stenosis. 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 Electronically Signed   By: Pincus Badder.D.  On: 07/21/2021 23:27   MR BRAIN WO CONTRAST  Result Date: 07/21/2021 CLINICAL DATA:  Initial evaluation for altered mental status. EXAM: MRI HEAD WITHOUT CONTRAST MRA HEAD WITHOUT CONTRAST TECHNIQUE: Multiplanar, multi-echo pulse sequences of the brain and surrounding structures were acquired without intravenous contrast. Angiographic images of the Circle of Willis were acquired using MRA technique without intravenous contrast. COMPARISON:  Prior CT from earlier the same day. FINDINGS: MRI HEAD FINDINGS Brain: Generalized age-related cerebral atrophy. Patchy and confluent T2/FLAIR  hyperintensity involving the periventricular deep white matter both cerebral hemispheres as well as the pons, consistent with chronic small vessel ischemic disease, moderately advanced in nature. Small remote left cerebellar infarct noted. Previously identified intraparenchymal hematoma centered at the left temporal occipital region again seen. Hemorrhage measures approximately 3.7 x 3.7 x 3.7 cm (estimated volume 25 mL). No visible underlying mass lesion or other structural abnormality on this noncontrast examination. Associated small volume subarachnoid hemorrhage within the adjacent right temporal region. Surrounding vasogenic edema with partial mass effect on the atrium of the right lateral ventricle and trace 3 mm right-to-left shift. No hydrocephalus or trapping. Basilar cisterns remain patent. Trace intraventricular hemorrhage noted as well, likely related to redistribution. No other evidence for acute intracranial hemorrhage. No acute or subacute infarct elsewhere within the brain. Gray-white matter differentiation otherwise maintained. No other foci of susceptibility artifact to suggest acute or chronic intracranial blood products. No mass lesion or extra-axial fluid collection. Pituitary gland suprasellar region normal. Midline structures intact. Vascular: Major intracranial vascular flow voids are well maintained. Skull and upper cervical spine: Craniocervical junction within normal limits. Bone marrow signal intensity normal. No scalp soft tissue abnormality. Sinuses/Orbits: Globes orbital soft tissues within normal limits. Mild mucosal thickening noted within the ethmoidal air cells. Paranasal sinuses are otherwise clear. Small bilateral mastoid effusions noted. Visualized nasopharynx unremarkable. Other: None. MRA HEAD FINDINGS Anterior circulation: Examination degraded by motion artifact and positioning. Visualized distal cervical segments of the internal carotid arteries are patent with antegrade  flow. Petrous segments patent bilaterally. Atheromatous irregularity seen within the carotid siphons without hemodynamically significant stenosis. Irregular 5 mm focal outpouching extending laterally from the cavernous right ICA consistent with a small aneurysm (series 19, image 57). A1 segments patent bilaterally. Normal anterior communicating artery complex. Both anterior cerebral arteries grossly patent to their distal aspects without significant stenosis. No M1 stenosis or occlusion. Normal MCA bifurcations. Distal MCA branches well perfused and fairly symmetric. Posterior circulation: Visualized distal V4 segments patent without stenosis. Right vertebral artery slightly dominant. Basilar patent to its distal aspect without stenosis. Superior cerebellar arteries patent bilaterally. Both PCAs primarily supplied via the basilar well perfused or distal aspects. Anatomic variants: None significant. No vascular abnormality seen underlying the right cerebral hemorrhage. IMPRESSION: MRI HEAD IMPRESSION: 1. 25 mL intraparenchymal hematoma centered at the right temporoccipital region. No visible underlying mass lesion or other structural abnormality on this noncontrast examination. Associated vasogenic edema with trace 3 mm of right-to-left shift. 2. Associated small volume subarachnoid hemorrhage within the adjacent right cerebral hemisphere. Trace intraventricular hemorrhage likely related to redistribution. No hydrocephalus or trapping. 3. Underlying age-related cerebral atrophy with moderately advanced chronic microvascular ischemic disease. 4. Small remote left cerebellar infarct. MRA HEAD IMPRESSION: 1. No vascular abnormality seen underlying the right cerebral hemorrhage. Negative intracranial MRA 2. For large vessel occlusion. Mild atheromatous irregularity about the carotid siphons without hemodynamically significant or correctable stenosis. 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  Electronically Signed  By: Jeannine Boga M.D.   On: 07/21/2021 23:27   US RENAL  Result Date: 07/24/2021 CLINICAL DATA:  Acute kidney injury. EXAM: RENAL / URINARY TRACT ULTRASOUND COMPLETE COMPARISON:  CT abdomen and pelvis dated July 20, 2021. FINDINGS: Right Kidney: Renal measurements: 10.2 x 4.4 x 3.7 cm = volume: 86 mL. Increased echogenicity. No mass or hydronephrosis visualized. Multiple cysts measuring up to 1.7 cm. Left Kidney: Renal measurements: 9.7 x 5.2 x 4.3 cm = volume: 113 mL. Increased echogenicity. No mass or hydronephrosis visualized. Multiple cysts measuring up to 1.6 cm. Bladder: Decompressed by Foley catheter. Other: None. IMPRESSION: 1. No acute abnormality. Resolved bilateral hydronephrosis after Foley catheter placement. 2. Increased bilateral renal echogenicity, consistent with medical renal disease. Electronically Signed   By: Titus Dubin M.D.   On: 07/24/2021 15:31   DG Chest Port 1 View  Result Date: 07/19/2021 CLINICAL DATA:  Suspected sepsis.  Altered mental status. EXAM: PORTABLE CHEST 1 VIEW COMPARISON:  None. FINDINGS: The heart is enlarged. There is a retrocardiac hiatal hernia. Patchy airspace opacities throughout the right lung and at the left lung base. There is background interstitial thickening. No significant pleural effusion. No pneumothorax. The bones are under mineralized. No acute osseous findings. IMPRESSION: 1. Patchy airspace opacities throughout the right lung and at the left lung base. Differential considerations include multifocal pneumonia, including atypical organisms, or asymmetric pulmonary edema. 2. Cardiomegaly and hiatal hernia. Electronically Signed   By: Keith Rake M.D.   On: 07/19/2021 21:52   DG Swallowing Func-Speech Pathology  Result Date: 07/24/2021 Table formatting from the original result was not included. Objective Swallowing Evaluation: Type of Study: MBS-Modified Barium Swallow Study  Patient Details Name: Lauren Conner  MRN: [id number] Date of Birth: [birth year] Today's Date: 07/24/2021 Time: SLP Start Time (ACUTE ONLY): 68 -SLP Stop Time (ACUTE ONLY): 1247 SLP Time Calculation (min) (ACUTE ONLY): 17 min Past Medical History: No past medical history on file. Past Surgical History: No past surgical history on file. HPI: 81 yo F with end-stage renal failure with CKD, cardiac dysfunction with arrhythmia and SVT, hypothyroidism, moderate to severe protein calorie malnutrition, chronic bronchiectasis, She was admitted with altered mental status and confusion. She was found to have leukocytosis with elevated lactate and metabolic acidosis. Chest CT with cylindrical and saccular bronchiectatic changes with chronic bronchitic airways bilaterally with mucoid impaction and bibasilar fibrotic changes and large hiatal hernia. MRI 25 mL intraparenchymal hematoma centered at the right temporoccipital region. associated small volume subarachnoid hemorrhage within the adjacent right cerebral hemisphere, underlying age-related cerebral atrophy with moderately advanced  chronic microvascular ischemic disease, small remote left cerebellar infarct.  Subjective: Pt alert, confusion evident. Yelling for people who are not present in room. Eyes closed intermittently. Cleared with RN. Son present.  Recommendations for follow up therapy are one component of a multi-disciplinary discharge planning process, led by the attending physician.  Recommendations may be updated based on patient status, additional functional criteria and insurance authorization. Assessment / Plan / Recommendation Clinical Impressions 07/24/2021 Clinical Impression Pt was seen in radiology suite for modified barium swallow study. Trials of puree solids, regular texture solids, a 66mm barium tablet, and thin liquids via cup and straw were administered. Pt presented with mild pharyngoesophageal dysphagia characterized by reduction in pharyngeal constriction, and PES distension. A CP bar was  intermittently noted and residue was noted at the level of the PES. Solid residue was cleared with a liquid wash and liquid residue was reduced with secondary swallows.  It is recommended that the pt's current diet of regular solids and thin liquids be continued at this time. SLP will see patient once more for education; however, it is anticipated that further skilled SLP services for swallowing will likely not be needed beyond that. SLP Visit Diagnosis Dysphagia, pharyngoesophageal phase (R13.14) Attention and concentration deficit following -- Frontal lobe and executive function deficit following -- Impact on safety and function Mild aspiration risk   Treatment Recommendations 07/24/2021 Treatment Recommendations Therapy as outlined in treatment plan below   Prognosis 07/24/2021 Prognosis for Safe Diet Advancement Good Barriers to Reach Goals Cognitive deficits Barriers/Prognosis Comment -- Diet Recommendations 07/24/2021 SLP Diet Recommendations Regular solids;Thin liquid Liquid Administration via Cup;Straw Medication Administration Whole meds with liquid Compensations Small sips/bites;Slow rate Postural Changes Seated upright at 90 degrees   Other Recommendations 07/24/2021 Recommended Consults -- Oral Conner Recommendations Oral Conner BID Other Recommendations -- Follow Up Recommendations Acute inpatient rehab (3hours/day) Assistance recommended at discharge Frequent or constant Supervision/Assistance Functional Status Assessment Patient has had a recent decline in their functional status and demonstrates the ability to make significant improvements in function in a reasonable and predictable amount of time. Frequency and Duration  07/24/2021 Speech Therapy Frequency (ACUTE ONLY) min 1 x/week Treatment Duration 1 week   Oral Phase 07/24/2021 Oral Phase WFL Oral - Pudding Teaspoon -- Oral - Pudding Cup -- Oral - Honey Teaspoon -- Oral - Honey Cup -- Oral - Nectar Teaspoon -- Oral - Nectar Cup -- Oral - Nectar Straw -- Oral -  Thin Teaspoon -- Oral - Thin Cup -- Oral - Thin Straw -- Oral - Puree -- Oral - Mech Soft -- Oral - Regular -- Oral - Multi-Consistency -- Oral - Pill -- Oral Phase - Comment --  Pharyngeal Phase 07/24/2021 Pharyngeal Phase WFL Pharyngeal- Pudding Teaspoon -- Pharyngeal -- Pharyngeal- Pudding Cup -- Pharyngeal -- Pharyngeal- Honey Teaspoon -- Pharyngeal -- Pharyngeal- Honey Cup -- Pharyngeal -- Pharyngeal- Nectar Teaspoon -- Pharyngeal -- Pharyngeal- Nectar Cup -- Pharyngeal -- Pharyngeal- Nectar Straw -- Pharyngeal -- Pharyngeal- Thin Teaspoon -- Pharyngeal -- Pharyngeal- Thin Cup Pharyngeal residue - cp segment;Reduced pharyngeal peristalsis Pharyngeal -- Pharyngeal- Thin Straw Pharyngeal residue - cp segment;Reduced pharyngeal peristalsis Pharyngeal -- Pharyngeal- Puree Pharyngeal residue - cp segment;Reduced pharyngeal peristalsis Pharyngeal -- Pharyngeal- Mechanical Soft -- Pharyngeal -- Pharyngeal- Regular -- Pharyngeal -- Pharyngeal- Multi-consistency -- Pharyngeal -- Pharyngeal- Pill -- Pharyngeal -- Pharyngeal Comment --  Cervical Esophageal Phase  07/24/2021 Cervical Esophageal Phase Impaired Pudding Teaspoon -- Pudding Cup -- Honey Teaspoon -- Honey Cup -- Nectar Teaspoon -- Nectar Cup -- Nectar Straw -- Thin Teaspoon -- Thin Cup Prominent cricopharyngeal segment;Reduced cricopharyngeal relaxation Thin Straw Prominent cricopharyngeal segment;Reduced cricopharyngeal relaxation Puree -- Mechanical Soft -- Regular -- Multi-consistency -- Pill -- Cervical Esophageal Comment -- Tobie Poet I. Hardin Negus, [place], [place] Office number [phone] Pager [place] 07/24/2021, 2:07 PM                     ECHOCARDIOGRAM COMPLETE  Result Date: 07/22/2021    ECHOCARDIOGRAM REPORT   Patient Name:   TAIYLOR VIRDEN Date of Exam: 07/22/2021 Medical Rec #:  [id number]   Height:       63.0 in Accession #:    [id number]  Weight:       99.9 lb Date of Birth:  1941/03/04   BSA:          1.439  m Patient Age:    30  years    BP:           121/63 mmHg Patient Gender: F           HR:           97 bpm. Exam Location:  Inpatient Procedure: 2D Echo, Cardiac Doppler and Color Doppler Indications:    Stroke  History:        Patient has no prior history of Echocardiogram examinations.                 Risk Factors:Hypertension.  Sonographer:    Jyl Heinz Referring Phys: [id number] [place]  1. Left ventricular ejection fraction, by estimation, is 65 to 70%. Left ventricular ejection fraction by 2D MOD biplane is 65.2 %. The left ventricle has normal function. The left ventricle has no regional wall motion abnormalities. Left ventricular diastolic parameters are consistent with Grade I diastolic dysfunction (impaired relaxation).  2. Right ventricular systolic function is low normal. The right ventricular size is normal. There is severely elevated pulmonary artery systolic pressure. The estimated right ventricular systolic pressure is 63.1 mmHg.  3. The mitral valve is grossly normal. Trivial mitral valve regurgitation.  4. The aortic valve is tricuspid. Aortic valve regurgitation is trivial. Aortic valve sclerosis is present, with no evidence of aortic valve stenosis. Aortic regurgitation PHT measures 509 msec.  5. The inferior vena cava is dilated in size with >50% respiratory variability, suggesting right atrial pressure of 8 mmHg. Comparison(s): No prior Echocardiogram. FINDINGS  Left Ventricle: Left ventricular ejection fraction, by estimation, is 65 to 70%. Left ventricular ejection fraction by 2D MOD biplane is 65.2 %. The left ventricle has normal function. The left ventricle has no regional wall motion abnormalities. The left ventricular internal cavity size was normal in size. There is no left ventricular hypertrophy. Left ventricular diastolic parameters are consistent with Grade I diastolic dysfunction (impaired relaxation). Indeterminate filling pressures. Right Ventricle: The right  ventricular size is normal. No increase in right ventricular wall thickness. Right ventricular systolic function is low normal. There is severely elevated pulmonary artery systolic pressure. The tricuspid regurgitant velocity is 3.91 m/s, and with an assumed right atrial pressure of 8 mmHg, the estimated right ventricular systolic pressure is 49.7 mmHg. Left Atrium: Left atrial size was normal in size. Right Atrium: Right atrial size was normal in size. Pericardium: There is no evidence of pericardial effusion. Mitral Valve: The mitral valve is grossly normal. Trivial mitral valve regurgitation. Tricuspid Valve: The tricuspid valve is grossly normal. Tricuspid valve regurgitation is trivial. Aortic Valve: The aortic valve is tricuspid. Aortic valve regurgitation is trivial. Aortic regurgitation PHT measures 509 msec. Aortic valve sclerosis is present, with no evidence of aortic valve stenosis. Aortic valve peak gradient measures 7.6 mmHg. Pulmonic Valve: The pulmonic valve was grossly normal. Pulmonic valve regurgitation is not visualized. Aorta: The aortic root and ascending aorta are structurally normal, with no evidence of dilitation. Venous: The inferior vena cava is dilated in size with greater than 50% respiratory variability, suggesting right atrial pressure of 8 mmHg. IAS/Shunts: No atrial level shunt detected by color flow Doppler.  LEFT VENTRICLE PLAX 2D                        Biplane EF (MOD) LVIDd:         4.00 cm         LV Biplane EF:   Left LVIDs:  2.50 cm                          ventricular LV PW:         1.00 cm                          ejection LV IVS:        1.00 cm                          fraction by LVOT diam:     2.00 cm                          2D MOD LV SV:         67                               biplane is LV SV Index:   47                               65.2 %. LVOT Area:     3.14 cm                                Diastology                                LV e' medial:    5.22  cm/s LV Volumes (MOD)               LV E/e' medial:  12.0 LV vol d, MOD    59.4 ml       LV e' lateral:   6.64 cm/s A2C:                           LV E/e' lateral: 9.4 LV vol d, MOD    60.8 ml A4C: LV vol s, MOD    20.3 ml A2C: LV vol s, MOD    20.7 ml A4C: LV SV MOD A2C:   39.1 ml LV SV MOD A4C:   60.8 ml LV SV MOD BP:    39.4 ml RIGHT VENTRICLE             IVC RV Basal diam:  3.30 cm     IVC diam: 2.00 cm RV Mid diam:    3.10 cm RV S prime:     10.40 cm/s TAPSE (M-mode): 2.3 cm LEFT ATRIUM             Index        RIGHT ATRIUM           Index LA diam:        3.80 cm 2.64 cm/m   RA Area:     17.20 cm LA Vol (A2C):   27.3 ml 18.97 ml/m  RA Volume:   45.10 ml  31.33 ml/m LA Vol (A4C):   44.1 ml 30.64 ml/m LA Biplane Vol: 36.8 ml 25.57 ml/m  AORTIC VALVE AV Area (Vmax): 2.62 cm AV Vmax:        138.00 cm/s AV Peak Grad:   7.6 mmHg LVOT  Vmax:      115.00 cm/s LVOT Vmean:     79.400 cm/s LVOT VTI:       0.214 m AI PHT:         509 msec  AORTA Ao Root diam: 3.20 cm Ao Asc diam:  3.00 cm MITRAL VALVE               TRICUSPID VALVE MV Area (PHT): 3.68 cm    TR Peak grad:   61.2 mmHg MV Decel Time: 206 msec    TR Vmax:        391.00 cm/s MV E velocity: 62.60 cm/s MV A velocity: 83.10 cm/s  SHUNTS MV E/A ratio:  0.75        Systemic VTI:  0.21 m                            Systemic Diam: 2.00 cm Lyman Bishop MD Electronically signed by Lyman Bishop MD Signature Date/Time: 07/22/2021/10:47:58 AM    Final     Lab Data:  CBC: Recent Labs  Lab 07/20/21 0745 07/21/21 0414 07/22/21 0424 07/23/21 0119 07/24/21 0043  WBC 13.6* 11.0* 8.4 9.3 6.5  HGB 9.1* 7.6* 7.9* 9.0* 7.1*  HCT 27.5* 23.1* 24.0* 27.8* 22.2*  MCV 93.5 92.0 94.9 96.2 96.9  PLT 273 238 193 241 355   Basic Metabolic Panel: Recent Labs  Lab 07/20/21 0745 07/21/21 0414 07/21/21 1746 07/21/21 2337 07/22/21 0424 07/23/21 0119 07/24/21 0043  NA 129* 133* 130* 132* 132* 133* 134*  K 4.5 4.1  --   --  4.3 4.0 3.8  CL 102 105  --   --  105  105 108  CO2 16* 16*  --   --  18* 18* 18*  GLUCOSE 116* 90  --   --  51* 95 109*  BUN 39* 47*  --   --  46* 42* 40*  CREATININE 2.07* 2.39*  --   --  2.54* 2.59* 2.78*  CALCIUM 8.3* 8.1*  --   --  7.9* 8.2* 7.5*  MG  --   --   --   --  1.7  --   --   PHOS  --   --   --   --  4.0  --   --    GFR: Estimated Creatinine Clearance: 11.7 mL/min (A) (by C-G formula based on SCr of 2.78 mg/dL (H)). Liver Function Tests: Recent Labs  Lab 07/19/21 2200 07/21/21 0414  AST 35 20  ALT 15 12  ALKPHOS 68 53  BILITOT 0.7 0.6  PROT 7.9 6.0*  ALBUMIN 3.1* 2.4*   No results for input(s): LIPASE, AMYLASE in the last 168 hours. No results for input(s): AMMONIA in the last 168 hours. Coagulation Profile: Recent Labs  Lab 07/20/21 0016  INR 1.2   Cardiac Enzymes: No results for input(s): CKTOTAL, CKMB, CKMBINDEX, TROPONINI in the last 168 hours. BNP (last 3 results) No results for input(s): PROBNP in the last 8760 hours. HbA1C: Recent Labs    07/23/21 0119  HGBA1C 5.1   CBG: Recent Labs  Lab 07/22/21 0746 07/22/21 0836 07/22/21 1146 07/23/21 0118  GLUCAP 44* 111* 77 96   Lipid Profile: Recent Labs    07/23/21 0119  CHOL 157  HDL 45  LDLCALC 94  TRIG 90  CHOLHDL 3.5   Thyroid Function Tests: Recent Labs    07/22/21 1106  TSH 10.941*   Anemia Panel: No  results for input(s): VITAMINB12, FOLATE, FERRITIN, TIBC, IRON, RETICCTPCT in the last 72 hours. Urine analysis:    Component Value Date/Time   COLORURINE STRAW (A) 07/24/2021 1346   APPEARANCEUR CLEAR 07/24/2021 1346   LABSPEC 1.005 07/24/2021 1346   PHURINE 5.0 07/24/2021 1346   GLUCOSEU NEGATIVE 07/24/2021 1346   HGBUR SMALL (A) 07/24/2021 1346   BILIRUBINUR NEGATIVE 07/24/2021 1346   KETONESUR NEGATIVE 07/24/2021 1346   PROTEINUR 100 (A) 07/24/2021 1346   NITRITE NEGATIVE 07/24/2021 1346   LEUKOCYTESUR NEGATIVE 07/24/2021 1346     Paytyn Mesta M.D. [facility] 07/24/2021, 3:51 PM  Available via  Epic secure chat 7am-7pm After 7 pm, please refer to night coverage provider listed on amion.

## 2021-07-24 NOTE — Progress Notes (Signed)
Inpatient Rehab Admissions Coordinator:  ? ?Met with patient and her niece Vinnie Level) at the bedside to discuss CIR recommendations and goals/expectations of CIR stay.  I reviewed 3 hrs/day of therapy, physician follow up, and average length of stay 2 weeks (dependent on progress).  I also reviewed likely goals of supervision to mod I, and pt and Vinnie Level state they have already begun discussing support and pt will have 24/7 if needed.  They mentioned a possible nephrology consult for AKI so I will touch base with medical team regarding timing of workup and transition to CIR when ready.  Beds available.   ? ?Shann Medal, PT, DPT ?Admissions Coordinator ?[phone] ?07/24/21  ?10:50 AM ? ?

## 2021-07-24 NOTE — Consult Note (Signed)
[facility] Renal Consultation Note  Requesting MD: Rai Indication for Consultation: A on CRF  HPI:  Lauren Conner is a 81 y.o. female with past medical history significant for HTN, bronchiectasis/MAI, hypothyroidism but also CKD-  Follows with Dr.Lehrich with nephrology at [facility]-  termed CKD of unknown etiology -  fairly stable and followed yearly-  last seen in March of 22.  Crt  in Feb of 23 was 2.1 , in June of 22 was 1.9 and in May of 2021 1.9 and in 2017 crt 1.5. she was admitted on 07/19/21 for weakness and confusion-  had fever/maybe PNA-  crt of 2.1-  fairly stable-  was found to have bilat hydro-  foley cath was placed.  She was treated in the ICU for sepsis-  cefepime/vanc but then changed pretty quickly to levaquin-  also found to have ICH and adjacent SAH-  has improved from PNA and ICH- at baseline MS.  I am asked to see because crt at baseline of 2.0 has increased slowly daily to now 2.78 today -  foley has remained in place.  Urine on 3/4 showed protein but no cells-  urine output seems more than adequate -  2800 over 3/7-3/8-  not recorded since. Initial BP was high-  then was soft in the low 100's -  amlodipine and lopressor held on 3/8  Creatinine, Ser  Date/Time Value Ref Range Status  07/24/2021 12:43 AM 2.78 (H) 0.44 - 1.00 mg/dL Final  07/23/2021 01:19 AM 2.59 (H) 0.44 - 1.00 mg/dL Final  07/22/2021 04:24 AM 2.54 (H) 0.44 - 1.00 mg/dL Final  07/21/2021 04:14 AM 2.39 (H) 0.44 - 1.00 mg/dL Final  07/20/2021 07:45 AM 2.07 (H) 0.44 - 1.00 mg/dL Final  07/19/2021 10:00 PM 2.13 (H) 0.44 - 1.00 mg/dL Final     PMHx:  No past medical history on file.  No past surgical history on file.  Family Hx: No family history on file.  Social History:  has no history on file for tobacco use, alcohol use, and drug use.  Allergies:  Allergies  Allergen Reactions   Ethambutol Other (See Comments)    Patient reports cramping   Iodinated Contrast Media Rash     Medications: Prior to Admission medications   Medication Sig Start Date End Date Taking? Authorizing Provider  albuterol (PROVENTIL) (2.5 MG/3ML) 0.083% nebulizer solution Take 2.5 mg by nebulization every 6 (six) hours as needed for wheezing.    [provider]  albuterol (VENTOLIN HFA) 108 (90 Base) MCG/ACT inhaler Inhale 2 puffs into the lungs every 4 (four) hours as needed for wheezing.    [provider]  fluticasone-salmeterol (ADVAIR DISKUS) 250-50 MCG/ACT AEPB Inhale 1 puff into the lungs every 12 (twelve) hours.    [provider]  levofloxacin (LEVAQUIN) 500 MG/100ML SOLN Inject 100 mLs (500 mg total) into the vein every other day. 07/22/21   Wouk, Ailene Rud, MD  levothyroxine (SYNTHROID) 75 MCG tablet Take 75 mcg by mouth daily. 07/08/21   [provider]  niCARdipine in saline (CARDENE-IV) 20-0.86 MG/200ML-% SOLN Inject 3-15 mg/hr into the vein continuous. 07/21/21   Wouk, Ailene Rud, MD    I have reviewed the patient's current medications.  Labs:  Results for orders placed or performed during the hospital encounter of 07/21/21 (from the past 48 hour(s))  Glucose, capillary     Status: None   Collection Time: 07/23/21  1:18 AM  Result Value Ref Range   Glucose-Capillary 96 70 -  99 mg/dL    Comment: Glucose reference range applies only to samples taken after fasting for at least 8 hours.  CBC     Status: Abnormal   Collection Time: 07/23/21  1:19 AM  Result Value Ref Range   WBC 9.3 4.0 - 10.5 K/uL   RBC 2.89 (L) 3.87 - 5.11 MIL/uL   Hemoglobin 9.0 (L) 12.0 - 15.0 g/dL   HCT 27.8 (L) 36.0 - 46.0 %   MCV 96.2 80.0 - 100.0 fL   MCH 31.1 26.0 - 34.0 pg   MCHC 32.4 30.0 - 36.0 g/dL   RDW 13.7 11.5 - 15.5 %   Platelets 241 150 - 400 K/uL   nRBC 0.0 0.0 - 0.2 %    Comment: Performed at [facility], [place] [address] [place], [place] [address]  Basic metabolic panel     Status: Abnormal   Collection Time: 07/23/21  1:19 AM  Result  Value Ref Range   Sodium 133 (L) 135 - 145 mmol/L   Potassium 4.0 3.5 - 5.1 mmol/L   Chloride 105 98 - 111 mmol/L   CO2 18 (L) 22 - 32 mmol/L   Glucose, Bld 95 70 - 99 mg/dL    Comment: Glucose reference range applies only to samples taken after fasting for at least 8 hours.   BUN 42 (H) 8 - 23 mg/dL   Creatinine, Ser 2.59 (H) 0.44 - 1.00 mg/dL   Calcium 8.2 (L) 8.9 - 10.3 mg/dL   GFR, Estimated 18 (L) >60 mL/min    Comment: (NOTE) Calculated using the CKD-EPI Creatinine Equation (2021)    Anion gap 10 5 - 15    Comment: Performed at [facility] [address] [place], [place] [address]  Lipid panel     Status: None   Collection Time: 07/23/21  1:19 AM  Result Value Ref Range   Cholesterol 157 0 - 200 mg/dL   Triglycerides 90 <150 mg/dL   HDL 45 >40 mg/dL   Total CHOL/HDL Ratio 3.5 RATIO   VLDL 18 0 - 40 mg/dL   LDL Cholesterol 94 0 - 99 mg/dL    Comment:        Total Cholesterol/HDL:CHD Risk Coronary Heart Disease Risk Table                     Men   Women  1/2 Average Risk   3.4   3.3  Average Risk       5.0   4.4  2 X Average Risk   9.6   7.1  3 X Average Risk  23.4   11.0        Use the calculated Patient Ratio above and the CHD Risk Table to determine the patient's CHD Risk.        ATP III CLASSIFICATION (LDL):  <100     mg/dL   Optimal  100-129  mg/dL   Near or Above                    Optimal  130-159  mg/dL   Borderline  160-189  mg/dL   High  >190     mg/dL   Very High Performed at [facility] [address] [place], [place] [address]   Hemoglobin A1c     Status: None   Collection Time: 07/23/21  1:19 AM  Result Value Ref Range   Hgb A1c MFr Bld 5.1 4.8 - 5.6 %  Comment: (NOTE) Pre diabetes:          5.7%-6.4%  Diabetes:              >6.4%  Glycemic control for   <7.0% adults with diabetes    Mean Plasma Glucose 99.67 mg/dL    Comment: Performed at [facility] [address], [place], [place] [address]  CBC     Status:  Abnormal   Collection Time: 07/24/21 12:43 AM  Result Value Ref Range   WBC 6.5 4.0 - 10.5 K/uL   RBC 2.29 (L) 3.87 - 5.11 MIL/uL   Hemoglobin 7.1 (L) 12.0 - 15.0 g/dL   HCT 22.2 (L) 36.0 - 46.0 %   MCV 96.9 80.0 - 100.0 fL   MCH 31.0 26.0 - 34.0 pg   MCHC 32.0 30.0 - 36.0 g/dL   RDW 13.6 11.5 - 15.5 %   Platelets 203 150 - 400 K/uL   nRBC 0.0 0.0 - 0.2 %    Comment: Performed at [facility], [place] [address] [place], [place] [address]  Basic metabolic panel     Status: Abnormal   Collection Time: 07/24/21 12:43 AM  Result Value Ref Range   Sodium 134 (L) 135 - 145 mmol/L   Potassium 3.8 3.5 - 5.1 mmol/L   Chloride 108 98 - 111 mmol/L   CO2 18 (L) 22 - 32 mmol/L   Glucose, Bld 109 (H) 70 - 99 mg/dL    Comment: Glucose reference range applies only to samples taken after fasting for at least 8 hours.   BUN 40 (H) 8 - 23 mg/dL   Creatinine, Ser 2.78 (H) 0.44 - 1.00 mg/dL   Calcium 7.5 (L) 8.9 - 10.3 mg/dL   GFR, Estimated 17 (L) >60 mL/min    Comment: (NOTE) Calculated using the CKD-EPI Creatinine Equation (2021)    Anion gap 8 5 - 15    Comment: Performed at [facility] [address], [place], [place] [address]     ROS:  A comprehensive review of systems was negative.  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  Has experienced over the last 4 days some worsening of renal function, nonoliguric and not uremic.  What I suspect is that the BP variability-  going from very high to low normal is our issue here.  I agree with d/c of amlodipine and metoprolol.  I am going to also recheck a U/A and a renal ultrasound to make sure the foley has led to resolution of her hydronephrosis.  Her good uop is a good sign for eventual recovery  2. Hypertension/volume  - seems euvolemic-  BP was soft, now BP meds have been stopped  3. Anemia  -  hgb dropped significantly from 3/8 to 3/9 ?  Will check iron stores and give a dose of ESA    Lambert Keto A Tarrin Menn 07/24/2021, 1:24 PM

## 2021-07-24 NOTE — Progress Notes (Addendum)
STROKE TEAM PROGRESS NOTE  ? ?INTERVAL HISTORY ?Her niece is at the bedside. Pt sitting in bed, AAO x 3, in good spirit. Cre still elevated, not good candidate for MRI with contrast. Will consider once Cre improved. Agree with Renal consult. Pending CIR ? ?Vitals:  ? 07/23/21 2328 07/24/21 0417 07/24/21 0740 07/24/21 1154  ?BP: (!) 102/53 (!) 109/51  131/79  ?Pulse: 85 86  87  ?Resp: 15 15  16   ?Temp: 98.2 ?F (36.8 ?C) 98.8 ?F (37.1 ?C)  (!) 97.5 ?F (36.4 ?C)  ?TempSrc: Oral Oral  Oral  ?SpO2: 94% 98% 97% 97%  ? ?CBC:  ?Recent Labs  ?Lab 07/23/21 ?0119 07/24/21 ?7782  ?WBC 9.3 6.5  ?HGB 9.0* 7.1*  ?HCT 27.8* 22.2*  ?MCV 96.2 96.9  ?PLT 241 203  ? ?Basic Metabolic Panel:  ?Recent Labs  ?Lab 07/22/21 ?0424 07/23/21 ?0119 07/24/21 ?4235  ?NA 132* 133* 134*  ?K 4.3 4.0 3.8  ?CL 105 105 108  ?CO2 18* 18* 18*  ?GLUCOSE 51* 95 109*  ?BUN 46* 42* 40*  ?CREATININE 2.54* 2.59* 2.78*  ?CALCIUM 7.9* 8.2* 7.5*  ?MG 1.7  --   --   ?PHOS 4.0  --   --   ? ?Lipid Panel:  ?Recent Labs  ?Lab 07/23/21 ?0119  ?CHOL 157  ?TRIG 90  ?HDL 45  ?CHOLHDL 3.5  ?VLDL 18  ?Mc[place]osh 94  ? ?HgbA1c:  ?Recent Labs  ?Lab 07/23/21 ?0119  ?HGBA1C 5.1  ? ?Urine Drug Screen: No results for input(s): LABOPIA, COCAINSCRNUR, LABBENZ, AMPHETMU, THCU, LABBARB in the last 168 hours.  ?Alcohol Level No results for input(s): ETH in the last 168 hours. ? ?IMAGING past 24 hours ?No results found. ? ?PHYSICAL EXAM ? ?Physical Exam  ?Constitutional: Appears well-developed and well-nourished. ?Cardiovascular: Normal rate and regular rhythm.  ?Respiratory: Effort normal, non-labored breathing ? ?Neuro: ?Mental Status: ?Patient is awake, alert, oriented to person, place, month, year, and situation. ?Psychomotor slowing ?No signs of aphasia or neglect ?Cranial Nerves: ?II: Pupils are equal, round, and reactive to light.  Left lower quadrantanopia.  ?III,IV, VI: EOMI without ptosis or diploplia.  ?V: Facial sensation is symmetric to temperature ?VII: Facial movement is  symmetric resting and smiling ?VIII: Hearing is intact to voice ?X: Palate elevates symmetrically ?XI: Shoulder shrug is symmetric. ?XII: Tongue protrudes midline without atrophy or fasciculations.  ?Motor: ?Tone is normal. Bulk is normal. 5/5 strength was present in all four extremities.  ?Sensory: ?Sensation is symmetric to light touch and temperature in the arms and legs. No extinction to DSS present.  ?Cerebellar: ?FTN intact  ? ?ASSESSMENT/PLAN ?Lauren Conner is a 81 y.o. female with history of CKD4, anemia, paroxysmal SVT, hypothyroidism, bronchiectasis, HTN presenting initially to [facility] with weakness, fever, tachycardia, BP 154/112, and confusion. Head CT then revealed a right parietal lobe ICH with mass effect and an adjacent subarachnoid hemorrhage. Plan for MRI with contrast once creatinine improves.  Per records baseline creatinine is around 1.7. CR 2.13-2.59 ? 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 Small amount of subarachnoid hemorrhage within sulci at the anterior RIGHT temporal lobe. 3 mm of RIGHT to LEFT midline shift ?Repeat CT- Stable appearance of right parietal and temporal lobe hemorrhage,  SAH, and IVH with surrounding edema and mass effect. ?MRI  25 mL intraparenchymal hematoma centered at the right temporoccipital region. No visible underlying mass lesion or other structural abnormality on this noncontrast examination. Associated vasogenic edema with trace 3 mm of right-to-left shift. Associated small volume subarachnoid hemorrhage within the adjacent right cerebral hemisphere. Trace intraventricular hemorrhage likely related to redistribution. Small remote left cerebellar infarct. ?Consider MRI with contrast once Cre  improves either as outpt or in CIR to rule out brain mass.  ?MRA   Mild atheromatous irregularity about the carotid siphons without hemodynamically significant or correctable stenosis. 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 ?2D Echo EF 65-70%, lt and rt atria normal in size ?VTE prophylaxis - SCDs ?Therapy recommendations:  CIR ?Disposition:  pending ? ?Hypertension ?Orthostatic hypotension ?Home meds:  None ?off metoprolol tartrate and amlodipine ?Stable ?TED hose ?BP under 160 ?Long term BP goal normotensive ? ?CKD stage 4 ?management by primary team ?Creatinine 2.13->2.07->2.39->2.54-> 2.59->2.78 ?On IV fluid-NS 100 ml/hr ?Agree with renal consult ?Consider MRI with contrast once Cre improves either as outpt or in CIR to rule out brain mass.  ? ?Other Stroke Risk Factors ?Advanced Age >/= 60  ?PSVT ? ?Other Active Problems ?Hypothyroidism- resume synthroid ?CAP with baseline bronchiectasis ?Levaquin- managed by primary team ?Urinary retention- management by primary team ?Foley catheter placed ?Urine cx negative ?Leukocytosis improved ?Flomax initiated, attempt to remove Foley today ? ?Hospital day # 3 ? ?Neurology will sign off. Please call with questions. Pt will follow up with stroke clinic Dr. Leonie Man at [facility] in about 4 weeks. Thanks for the consult. ? ? ?Rosalin Hawking, MD PhD ?Stroke Neurology ?07/24/2021 ?2:05 PM ? ? ? ? ?To contact Stroke Continuity provider, please refer to [email or web address] ?After hours, contact General Neurology ? ?

## 2021-07-25 ENCOUNTER — Inpatient Hospital Stay (HOSPITAL_COMMUNITY): Payer: Medicare Other

## 2021-07-25 LAB — CBC
HCT: 20.8 % — ABNORMAL LOW (ref 36.0–46.0)
Hemoglobin: 6.9 g/dL — CL (ref 12.0–15.0)
MCH: 31.8 pg (ref 26.0–34.0)
MCHC: 33.2 g/dL (ref 30.0–36.0)
MCV: 95.9 fL (ref 80.0–100.0)
Platelets: 203 10*3/uL (ref 150–400)
RBC: 2.17 MIL/uL — ABNORMAL LOW (ref 3.87–5.11)
RDW: 13.8 % (ref 11.5–15.5)
WBC: 6.2 10*3/uL (ref 4.0–10.5)
nRBC: 0 % (ref 0.0–0.2)

## 2021-07-25 LAB — HEMOGLOBIN AND HEMATOCRIT, BLOOD
HCT: 31.7 % — ABNORMAL LOW (ref 36.0–46.0)
Hemoglobin: 11 g/dL — ABNORMAL LOW (ref 12.0–15.0)

## 2021-07-25 LAB — BASIC METABOLIC PANEL
Anion gap: 6 (ref 5–15)
BUN: 35 mg/dL — ABNORMAL HIGH (ref 8–23)
CO2: 18 mmol/L — ABNORMAL LOW (ref 22–32)
Calcium: 7.5 mg/dL — ABNORMAL LOW (ref 8.9–10.3)
Chloride: 112 mmol/L — ABNORMAL HIGH (ref 98–111)
Creatinine, Ser: 2.44 mg/dL — ABNORMAL HIGH (ref 0.44–1.00)
GFR, Estimated: 20 mL/min — ABNORMAL LOW (ref >60)
Glucose, Bld: 80 mg/dL (ref 70–99)
Potassium: 3.9 mmol/L (ref 3.5–5.1)
Sodium: 136 mmol/L (ref 135–145)

## 2021-07-25 LAB — ABO/RH: ABO/RH(D): O POS

## 2021-07-25 LAB — IRON AND TIBC
Iron: 35 ug/dL (ref 28–170)
Saturation Ratios: 15 % (ref 10.4–31.8)
TIBC: 235 ug/dL — ABNORMAL LOW (ref 250–450)
UIBC: 200 ug/dL

## 2021-07-25 LAB — PREPARE RBC (CROSSMATCH)

## 2021-07-25 LAB — MAGNESIUM: Magnesium: 1.9 mg/dL (ref 1.7–2.4)

## 2021-07-25 LAB — FERRITIN: Ferritin: 45 ng/mL (ref 11–307)

## 2021-07-25 IMAGING — CT CT ABD-PELV W/O CM
2 of 4 series · 16 of 46 positions shown, 18 images · non-contrast
Comparison: CT [DATE]

CLINICAL DATA: Abdominal pain, acute, nonlocalized anemia, unclear
etiology, r/o RPH



[Series 3: a/p w/o 5mm · axial · non-contrast · 0.84mm/px · z∈[+1000,+1380]mm · 13 of 84 slices shown, 15 images]
[im 4/84  soft-tissue]
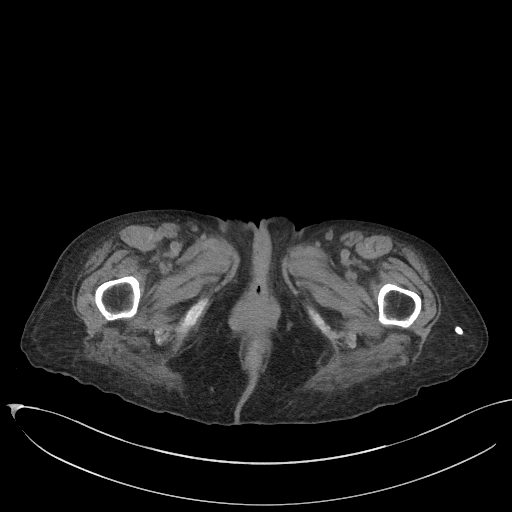
[im 4/84  bone]
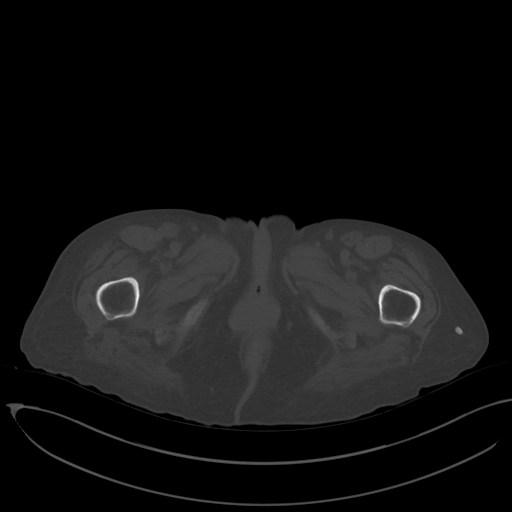
[im 10/84  soft-tissue]
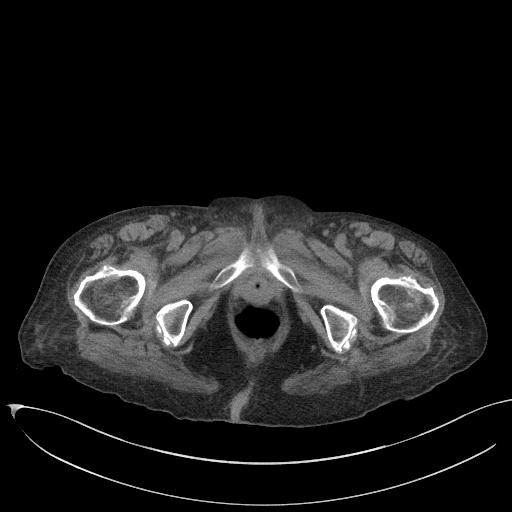
[im 16/84  soft-tissue]
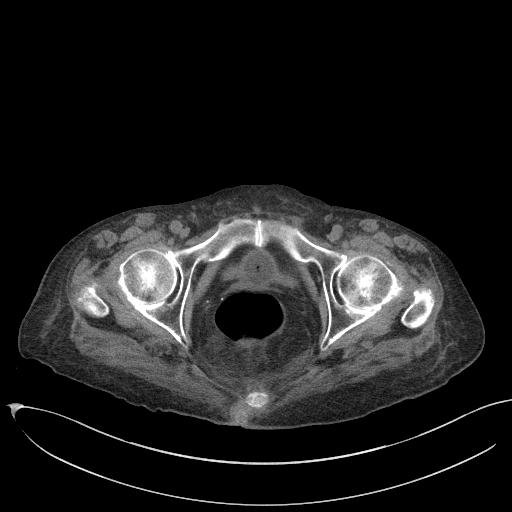
[im 23/84  soft-tissue]
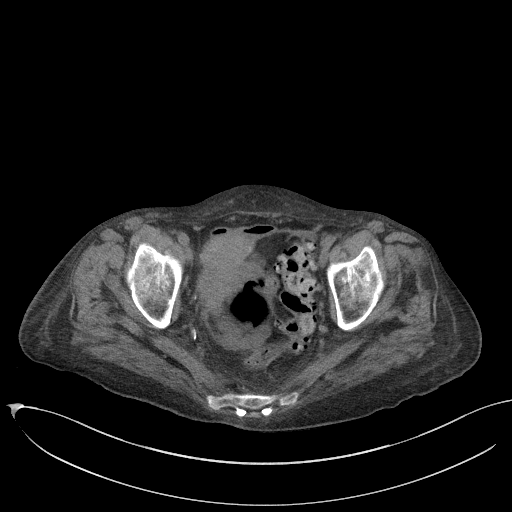
[im 29/84  soft-tissue]
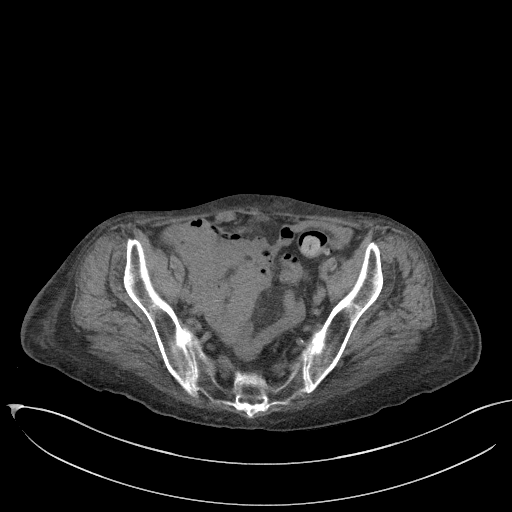
[im 36/84  soft-tissue]
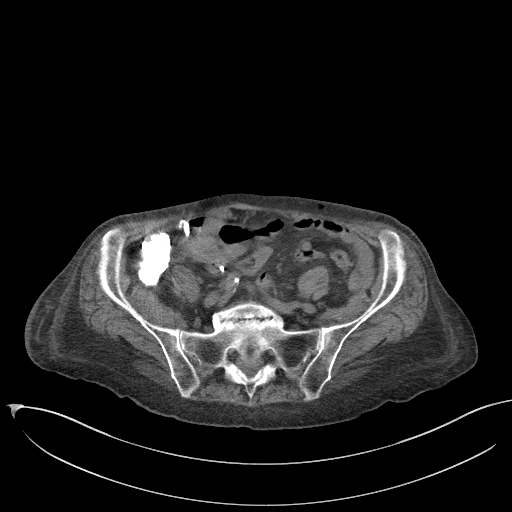
[im 42/84  soft-tissue]
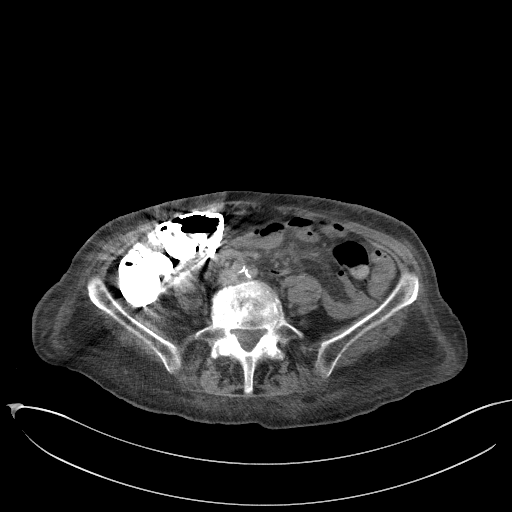
[im 48/84  soft-tissue]
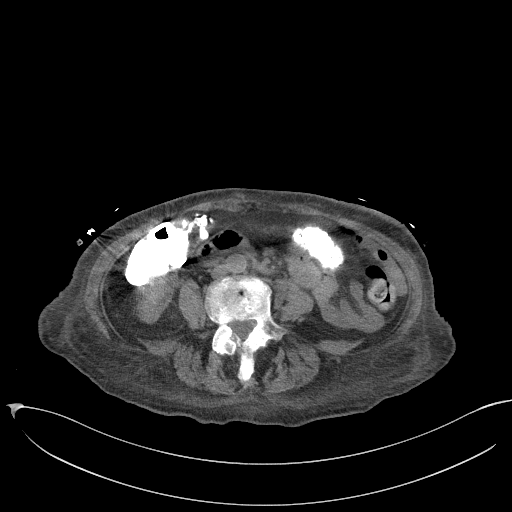
[im 55/84  soft-tissue]
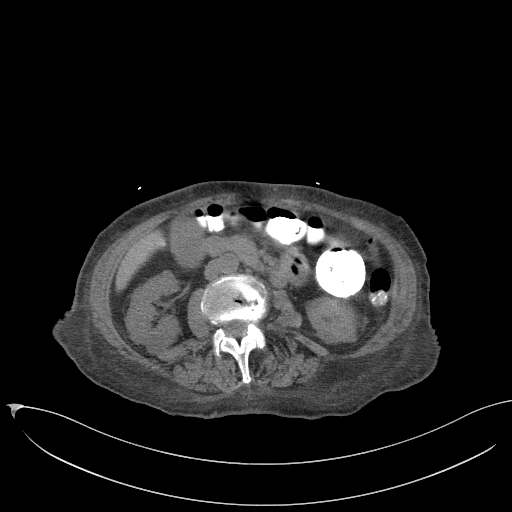
[im 55/84  bone]
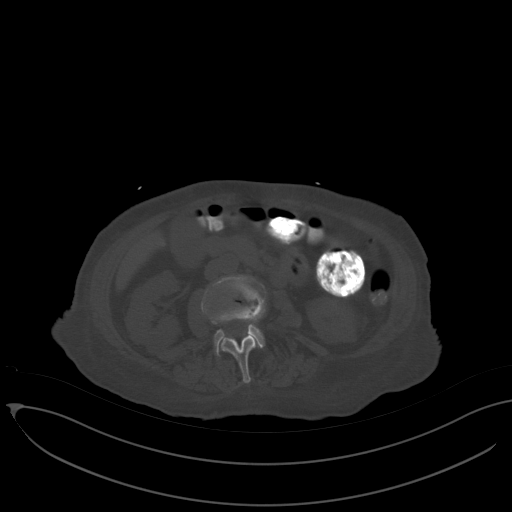
[im 61/84  soft-tissue]
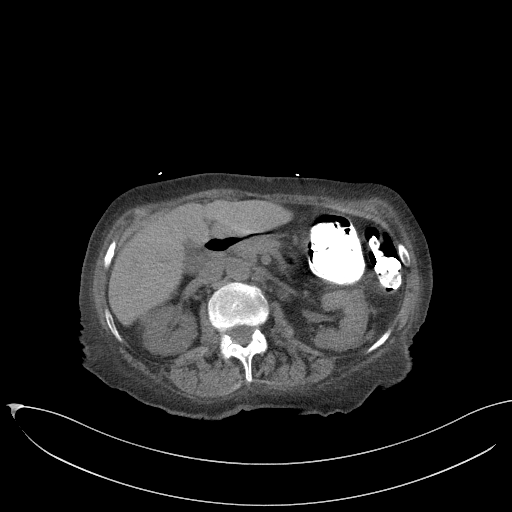
[im 68/84  soft-tissue]
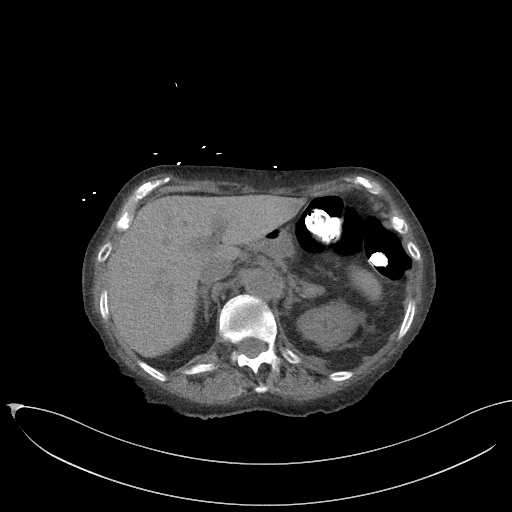
[im 74/84  soft-tissue]
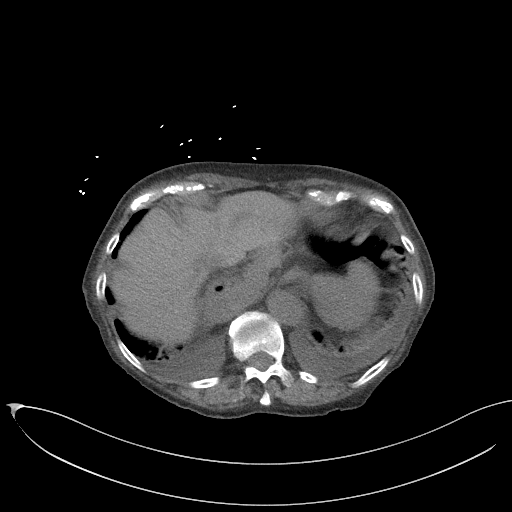
[im 80/84  soft-tissue]
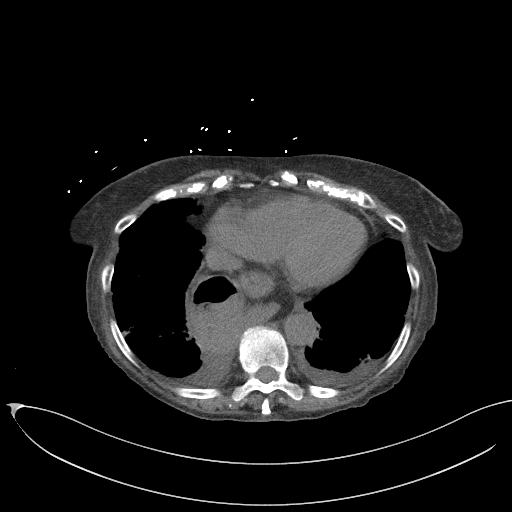

[Series 6: a/p w/o cor · coronal · non-contrast · 0.82mm/px · 3 of 145 slices shown]
[im 49/145  soft-tissue]
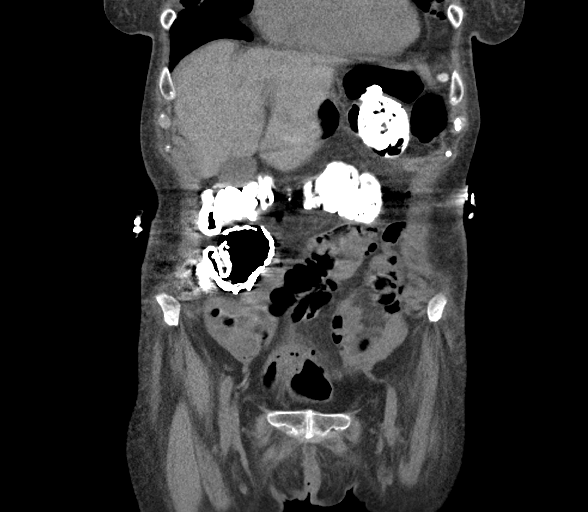
[im 65/145  soft-tissue]
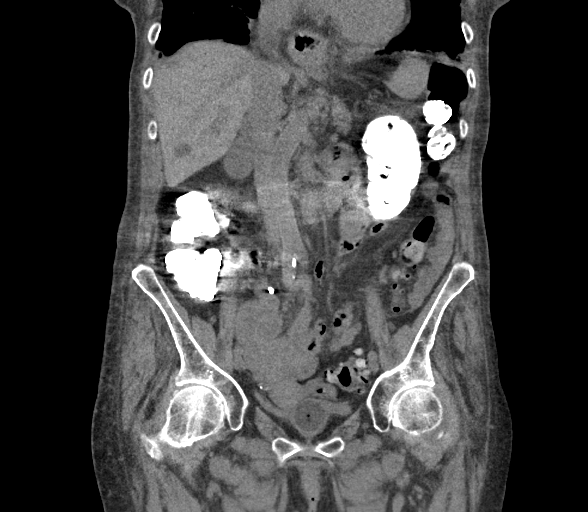
[im 81/145  soft-tissue]
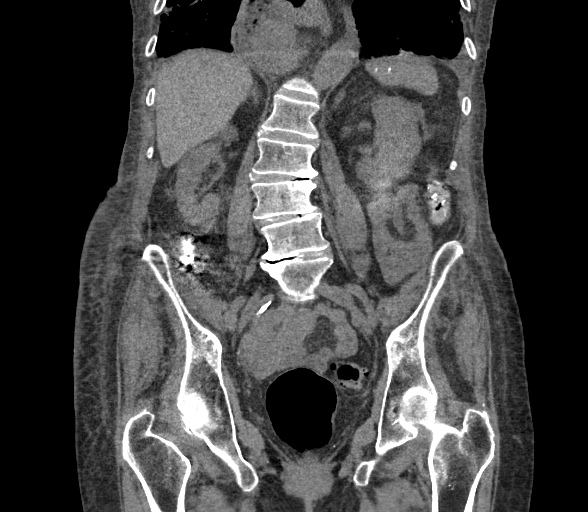

[16 of 46 positions shown; findings below may reference images not displayed]

FINDINGS: Lower chest: Large hiatal hernia. Parenchymal findings in the lung
bases are similar to recent chest CT and consistent with patient's
history of chronic atypical mycobacterial infection. Small bilateral
pleural effusions.

Hepatobiliary: Unchanged hepatic cysts. The gallbladder is
unremarkable.

Pancreas: Unremarkable. No pancreatic ductal dilatation or
surrounding inflammatory changes.

Spleen: Normal in size.  Multiple calcified splenic granulomas.

Adrenals/Urinary Tract: Adrenal glands are unremarkable. Interval
placement of the Foley catheter with decompression of the bladder
and renal collecting systems. Unchanged multiple bilateral renal
cysts.

Stomach/Bowel: Large hiatal hernia with intrathoracic stomach. There
is no evidence of bowel obstruction.There is retained barium within
the colon. The appendix is not definitively visualized. Extensive
sigmoid diverticulosis without evidence of acute diverticulitis.

Vascular/Lymphatic: Aortoiliac atherosclerotic calcifications. No
AAA. No lymphadenopathy.

Reproductive: Unremarkable.

Other: No free air. No abdominopelvic ascites. No evidence of
retroperitoneal hematoma as clinically questioned.

Musculoskeletal: No acute osseous abnormality. No suspicious lytic
or blastic lesions. Multilevel degenerative changes of the spine.
IMPRESSION: No acute abdominopelvic abnormality.

Interval Foley catheter placement with decompression of the bladder
and renal collecting systems.

Colonic diverticulosis.

Unchanged large hiatal hernia. Unchanged parenchymal findings the
lung bases consistent with chronic atypical mycobacterial infection.
Small bilateral pleural effusions.

## 2021-07-25 IMAGING — CT CT HEAD W/O CM
4 series · 15 of 47 positions shown, 17 images · non-contrast
Comparison: Three days ago

CLINICAL DATA: Subarachnoid hemorrhage follow-up



[Series 2: head without · axial · non-contrast · 0.44mm/px · z∈[-93,+27]mm · 7 of 33 slices shown, 9 images]
[im 5/33  brain]
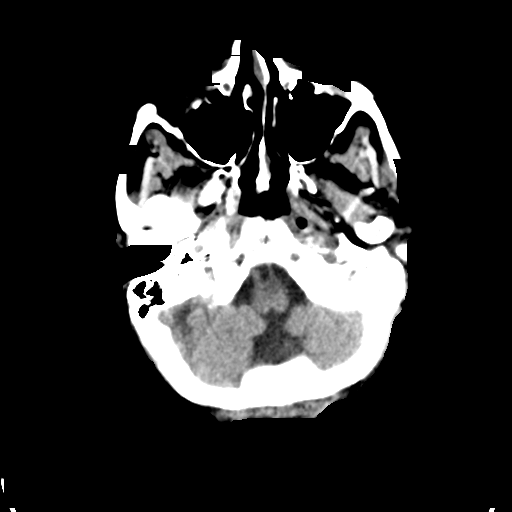
[im 5/33  bone]
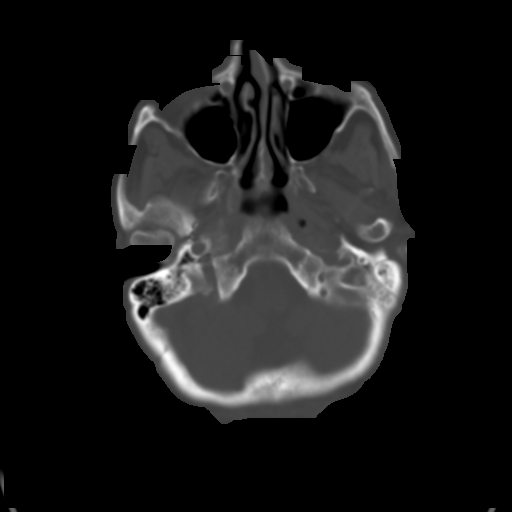
[im 9/33  brain]
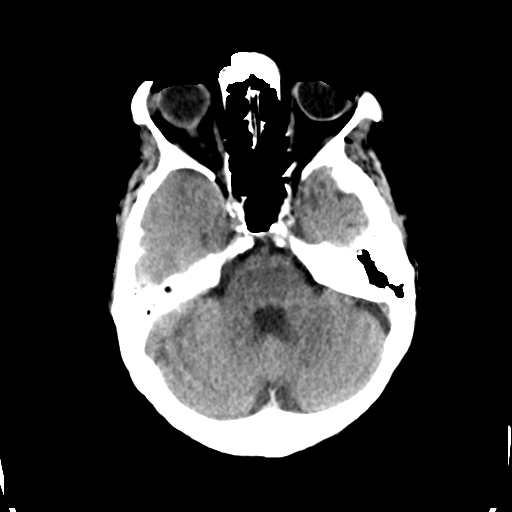
[im 13/33  brain]
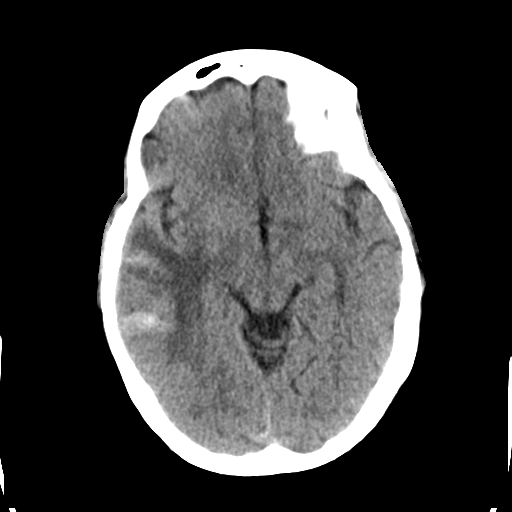
[im 17/33  brain]
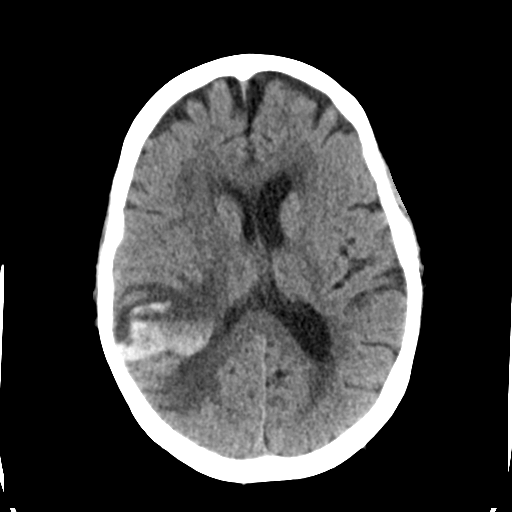
[im 21/33  brain]
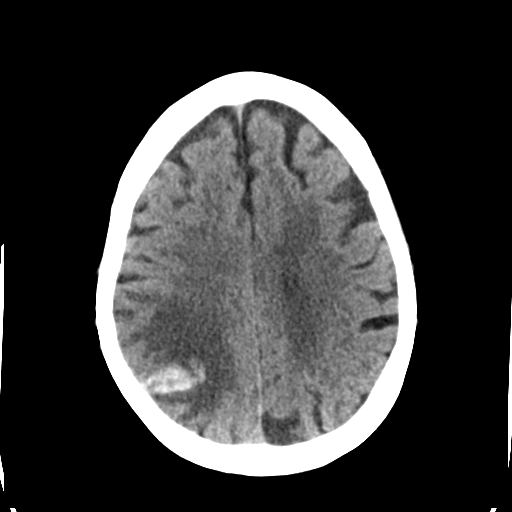
[im 21/33  bone]
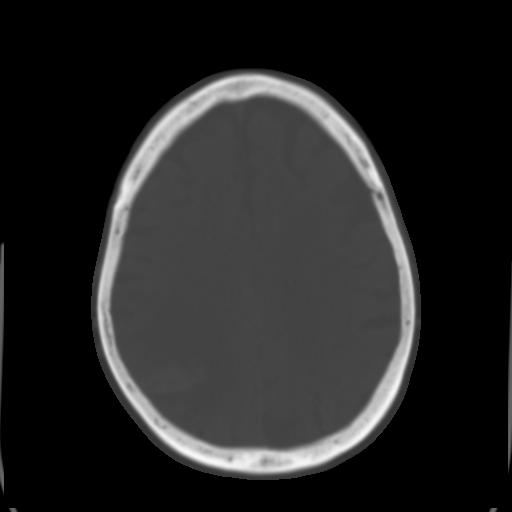
[im 25/33  brain]
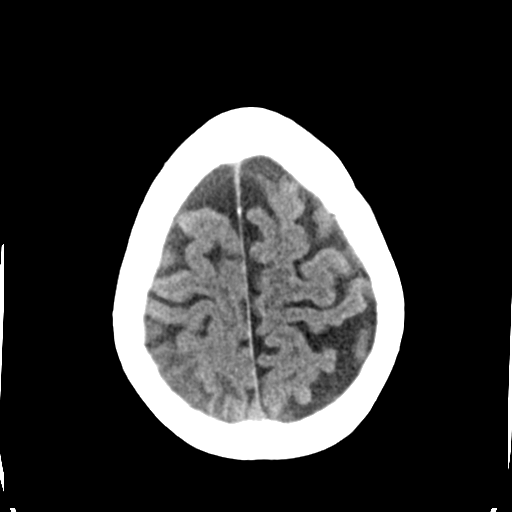
[im 29/33  brain]
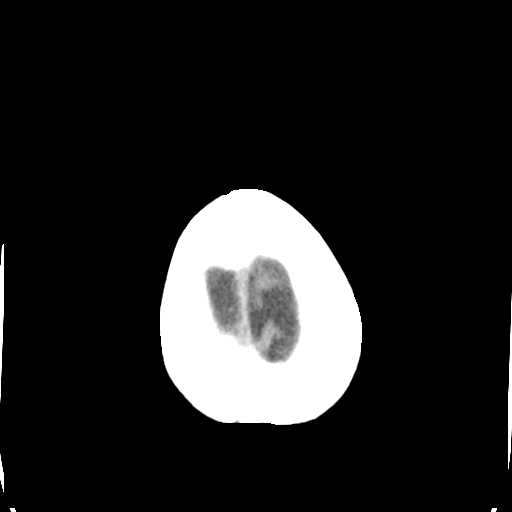

[Series 3: head bone · axial · 0.44mm/px · z∈[-97,-81]mm · 2 of 81 slices shown]
[im 9/81  bone]
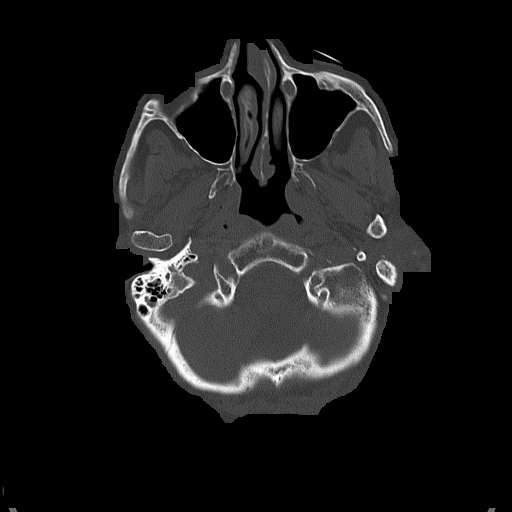
[im 17/81  bone]
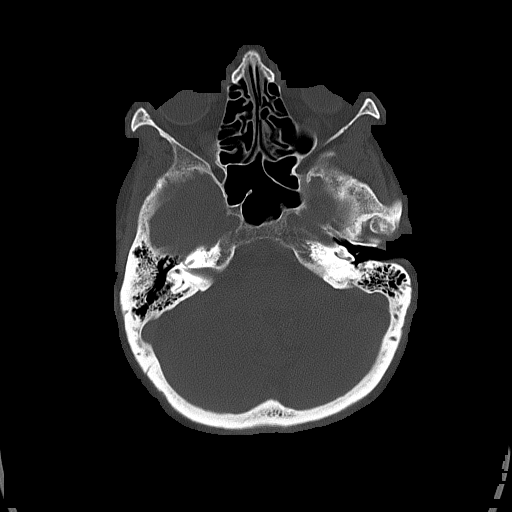

[Series 4: head without cor · coronal · non-contrast · 0.34mm/px · 3 of 69 slices shown]
[im 23/69  brain]
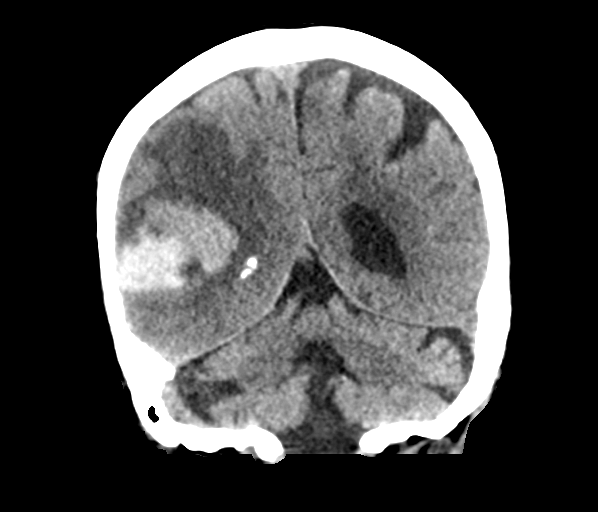
[im 31/69  brain]
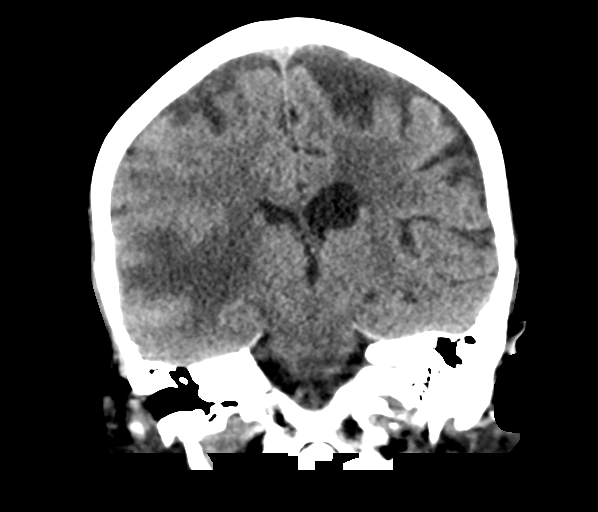
[im 38/69  brain]
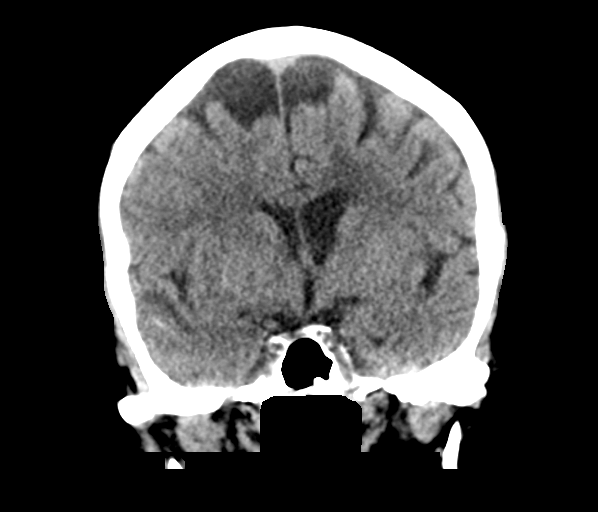

[Series 5: head without sag · sagittal · non-contrast · 0.35mm/px · 3 of 67 slices shown]
[im 23/67  brain]
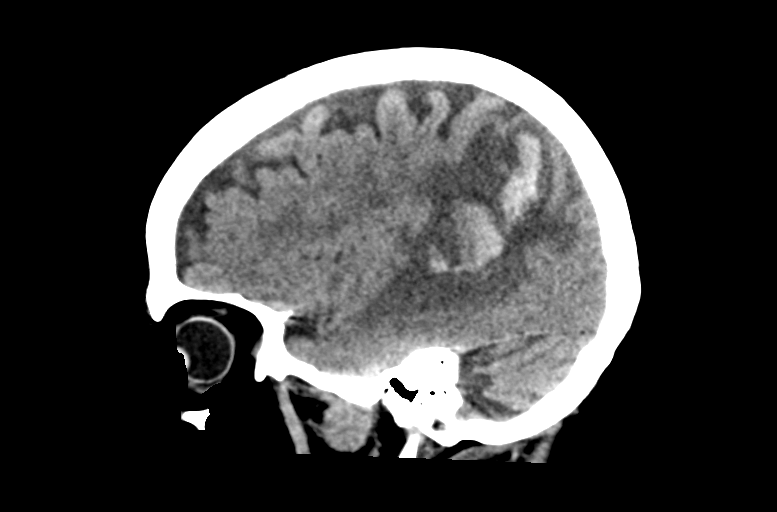
[im 34/67  brain]
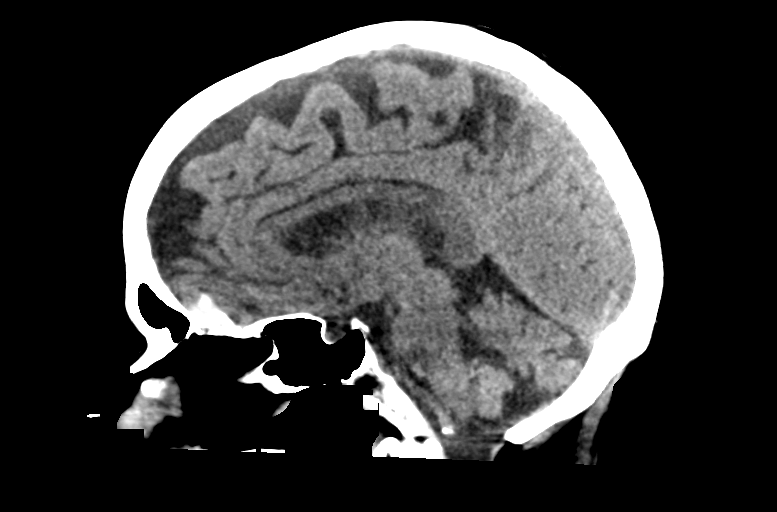
[im 45/67  brain]
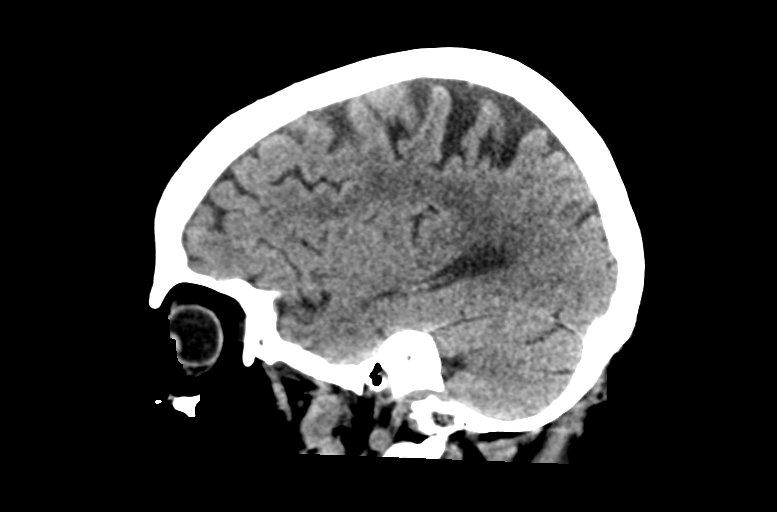

[15 of 47 positions shown; findings below may reference images not displayed]

FINDINGS: Brain: Unchanged shape and extent of parenchymal hemorrhage in the
right temporal and parietal lobes with irregular shape limiting
accurate measurement. Small volume overlying subarachnoid blood.
Midline shift measures 3 mm. No entrapment or interval infarct

Vascular: Atheromatous calcification

Skull: Normal. Negative for fracture or focal lesion.

Sinuses/Orbits: No acute finding.
IMPRESSION: Unchanged right cerebral hemorrhage with adjacent edema. Midline
shift measures 3 mm.

## 2021-07-25 MED ORDER — SODIUM CHLORIDE 0.9% IV SOLUTION: Freq: Once | INTRAVENOUS | Status: DC

## 2021-07-25 MED ORDER — SALINE SPRAY 0.65 % NA SOLN
1.0000 | NASAL | Status: DC | PRN
  Administered 2021-07-25: 1 via NASAL
  Filled 2021-07-25: qty 44

## 2021-07-25 MED ORDER — POLYETHYLENE GLYCOL 3350 17 G PO PACK
17.0000 g | PACK | Freq: Every day | ORAL | Status: DC
  Administered 2021-07-25 – 2021-07-26 (×2): 17 g via ORAL
  Filled 2021-07-25 (×2): qty 1

## 2021-07-25 MED ORDER — BISACODYL 10 MG RE SUPP
10.0000 mg | Freq: Once | RECTAL | Status: AC
  Administered 2021-07-25: 10 mg via RECTAL
  Filled 2021-07-25: qty 1

## 2021-07-25 MED ORDER — SODIUM CHLORIDE 0.9% IV SOLUTION: Freq: Once | INTRAVENOUS | Status: AC

## 2021-07-25 NOTE — Progress Notes (Signed)
Inpatient Rehab Admissions Coordinator:  ? ?I have a bed for this pt to admit to CIR on Saturday (3/11).  Dr. Tana Coast, Dr. Erlinda Hong, and Dr. Moshe Cipro in agreement.  Rehab MD to assess pt and confirm admission on Saturday AM.  Floor RN can call CIR at [phone] for report after 12pm on Saturday.  I have let pt/family and case manager know.   ? ?Shann Medal, PT, DPT ?Admissions Coordinator ?[phone] ?07/25/21  ?11:20 AM  ? ?

## 2021-07-25 NOTE — Plan of Care (Signed)
?  Problem: Education: ?Goal: Knowledge of disease or condition will improve ?Outcome: Progressing ?Goal: Knowledge of secondary prevention will improve (SELECT ALL) ?Outcome: Progressing ?  ?Problem: Safety: ?Goal: Ability to remain free from injury will improve ?Outcome: Progressing ?  ?

## 2021-07-25 NOTE — Progress Notes (Signed)
Occupational Therapy Treatment ?Patient Details ?Name: Lauren Conner ?MRN: [id number] ?DOB: [birth year] ?Today's Date: 07/25/2021 ? ? 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  ?OT comments ? Patient making good progress with OT treatment. Patient was min guard and verbal cues to get to EOB and transferred to recliner with increased time and min assist. Patient's BP was 150/81 seated in recliner and stood for less than a minute with BP 134/86 and patient stating she felt dizzy. Patient is good candidate for AIR to increase independence with self care and functional transfers.   ? ?Recommendations for follow up therapy are one component of a multi-disciplinary discharge planning process, led by the attending physician.  Recommendations may be updated based on patient status, additional functional criteria and insurance authorization. ?   ?Follow Up Recommendations ? Acute inpatient rehab (3hours/day)  ?  ?Assistance Recommended at Discharge Frequent or constant Supervision/Assistance  ?Patient can return home with the following ? A little help with walking and/or transfers;A little help with bathing/dressing/bathroom;Help with stairs or ramp for entrance;Assist for transportation;Assistance with cooking/housework;Direct supervision/assist for financial management;Direct supervision/assist for medications management ?  ?Equipment Recommendations ? Tub/shower seat  ?  ?Recommendations for Other Services   ? ?  ?Precautions / Restrictions Precautions ?Precautions: Fall ?Precaution Comments: orthostatic BP, L inattension/field cut ?Restrictions ?Weight Bearing Restrictions: No  ? ? ?  ? ?Mobility Bed Mobility ?Overal bed mobility: Needs Assistance ?Bed Mobility: Supine to Sit ?  ?  ?Supine to sit: Min guard ?  ?  ?General bed mobility comments: verbal  cues and min guard ?  ? ?Transfers ?Overall transfer level: Needs assistance ?Equipment used: None ?Transfers: Sit to/from Stand, Bed to chair/wheelchair/BSC ?Sit to Stand: Min assist ?  ?  ?Step pivot transfers: Min assist ?  ?  ?General transfer comment: Performed transfer to recliner from EOB and perform one stand from EOB with complaints of dizziness ?  ?  ?Balance Overall balance assessment: Needs assistance ?Sitting-balance support: No upper extremity supported, Feet supported, Feet unsupported ?Sitting balance-Leahy Scale: Good ?  ?  ?Standing balance support: Single extremity supported, During functional activity ?Standing balance-Leahy Scale: Poor ?Standing balance comment: stood from recliner to check BP with min assist for balance ?  ?  ?  ?  ?  ?  ?  ?  ?  ?  ?  ?   ? ?ADL either performed or assessed with clinical judgement  ? ?ADL Overall ADL's : Needs assistance/impaired ?  ?  ?  ?  ?  ?  ?  ?  ?Upper Body Dressing : Minimal assistance;Sitting ?Upper Body Dressing Details (indicate cue type and reason): donned gown to cover back ?Lower Body Dressing: Minimal assistance ?  ?  ?  ?  ?  ?  ?  ?  ?General ADL Comments: limited standing due to orthostatic ?  ? ?Extremity/Trunk Assessment   ?  ?  ?  ?  ?  ? ?Vision   ?Tracking/Visual Pursuits: Requires cues, head turns, or add eye shifts to track;Decreased smoothness of vertical tracking;Decreased smoothness of horizontal tracking ?Visual Fields: Left visual field deficit;Impaired-to be further tested in functional context ?  ?Perception   ?  ?Praxis   ?  ? ?Cognition Arousal/Alertness: Awake/alert ?Behavior During Therapy: [facility] for tasks assessed/performed ?Overall Cognitive Status: Impaired/Different from baseline ?Area of  Impairment: Problem solving ?  ?  ?  ?  ?  ?  ?  ?  ?  ?Current Attention Level: Selective ?  ?Following Commands: Follows one step commands consistently, Follows one step commands with increased time ?Safety/Judgement: Decreased  awareness of safety, Decreased awareness of deficits ?Awareness: Emergent ?Problem Solving: Slow processing ?General Comments: able to recall sit to stand technique and hand placement ?  ?  ?   ?Exercises   ? ?  ?Shoulder Instructions   ? ? ?  ?General Comments BP after transfer to recliner seated 150/81, standing 134/86 for less than one minute of standing  ? ? ?Pertinent Vitals/ Pain       Pain Assessment ?Pain Assessment: No/denies pain ?Facial Expression: smiling or inexpressive ?Pain Intervention(s): Monitored during session ? ?Home Living   ?  ?  ?  ?  ?  ?  ?  ?  ?  ?  ?  ?  ?  ?  ?  ?  ?  ?  ? ?  ?Prior Functioning/Environment    ?  ?  ?  ?   ? ?Frequency ? Min 2X/week  ? ? ? ? ?  ?Progress Toward Goals ? ?OT Goals(current goals can now be found in the care plan section) ? Progress towards OT goals: Progressing toward goals ? ?Acute Rehab OT Goals ?Patient Stated Goal: go to rehab ?OT Goal Formulation: With patient ?Time For Goal Achievement: 08/06/21 ?Potential to Achieve Goals: Good ?ADL Goals ?Pt Will Perform Grooming: with supervision;standing ?Pt Will Perform Lower Body Bathing: with supervision;sit to/from stand ?Pt Will Perform Lower Body Dressing: with supervision;sit to/from stand ?Pt Will Transfer to Toilet: with supervision;ambulating;regular height toilet ?Pt Will Perform Toileting - Clothing Manipulation and hygiene: with modified independence;sitting/lateral leans  ?Plan Discharge plan remains appropriate   ? ?Co-evaluation ? ? ?   ?  ?  ?  ?  ? ?  ?AM-PAC OT "6 Clicks" Daily Activity     ?Outcome Measure ? ? Help from another person eating meals?: None ?Help from another person taking care of personal grooming?: None ?Help from another person toileting, which includes using toliet, bedpan, or urinal?: A Little ?Help from another person bathing (including washing, rinsing, drying)?: A Lot ?Help from another person to put on and taking off regular upper body clothing?: A Little ?Help from  another person to put on and taking off regular lower body clothing?: A Lot ?6 Click Score: 18 ? ?  ?End of Session   ? ?OT Visit Diagnosis: Unsteadiness on feet (R26.81);Muscle weakness (generalized) (M62.81);Other symptoms and signs involving cognitive function;Low vision, both eyes (H54.2) ?  ?Activity Tolerance Patient tolerated treatment well ?  ?Patient Left in chair;with call bell/phone within reach;with chair alarm set;with family/visitor present ?  ?Nurse Communication Mobility status ?  ? ?   ? ?Time: 9794-8016 ?OT Time Calculation (min): 24 min ? ?Charges: OT General Charges ?$OT Visit: 1 Visit ?OT Treatments ?$Therapeutic Activity: 23-37 mins ? ?Lodema Hong, OTA ?Acute Rehabilitation Services  ?Pager [phone] ?Office [phone] ? ? ?[place] ?07/25/2021, 9:24 AM ?

## 2021-07-25 NOTE — Progress Notes (Signed)
Physical Therapy Treatment ?Patient Details ?Name: Lauren Conner ?MRN: [id number] ?DOB: [birth year] ?Today's Date: 07/25/2021 ? ? 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 ?  ?PT Comments  ? ? Pt making good gains, still limited by mild to moderate orthostatic hypotension.  Getting blood.  Hopefully this will make the difference, though pt is able to mobilize and stay upright longer today than last treatment.  Emphasis today on transitions, safe sit to stand, standing balance and assisted gait with the IV pole. ?   ?Recommendations for follow up therapy are one component of a multi-disciplinary discharge planning process, led by the attending physician.  Recommendations may be updated based on patient status, additional functional criteria and insurance authorization. ? ?Follow Up Recommendations ? Acute inpatient rehab (3hours/day) ?  ?  ?Assistance Recommended at Discharge Frequent or constant Supervision/Assistance  ?Patient can return home with the following A little help with walking and/or transfers;A little help with bathing/dressing/bathroom;Assistance with cooking/housework;Assist for transportation;Help with stairs or ramp for entrance ?  ?Equipment Recommendations ? Rolling walker (2 wheels);BSC/3in1  ?  ?Recommendations for Other Services Rehab consult ? ? ?  ?Precautions / Restrictions Precautions ?Precautions: Fall ?Precaution Comments: orthostatic BP, L inattension/field cut  ?  ? ?Mobility ? Bed Mobility ?Overal bed mobility: Needs Assistance ?Bed Mobility: Supine to Sit, Sit to Supine ?  ?  ?Supine to sit: Min guard ?Sit to supine: Min guard ?  ?  ?  ? ?Transfers ?Overall transfer level: Needs assistance ?Equipment used: None ?Transfers: Sit to/from Stand ?Sit to Stand: Min assist ?  ?  ?  ?  ?  ?  ?   ? ?Ambulation/Gait ?Ambulation/Gait assistance: Min assist ?Gait Distance (Feet): 20 Feet (then 22' after recovery from orthostatic changes to BP) ?Assistive device: IV Pole, 1 person hand held assist ?Gait Pattern/deviations: Step-through pattern ?  ?Gait velocity interpretation: <1.8 ft/sec, indicate of risk for recurrent falls ?  ?General Gait Details: mildly unsteady, tentative steps, a little wider BOS. ? ? ?Stairs ?  ?  ?  ?  ?  ? ? ?Wheelchair Mobility ?  ? ?Modified Rankin (Stroke Patients Only) ?Modified Rankin (Stroke Patients Only) ?Modified Rankin: Moderately severe disability ? ? ?  ?Balance Overall balance assessment: Needs assistance ?Sitting-balance support: No upper extremity supported, Feet supported, Feet unsupported ?Sitting balance-Leahy Scale: Good ?  ?  ?Standing balance support: Single extremity supported, During functional activity ?Standing balance-Leahy Scale: Poor ?  ?  ?  ?  ?  ?  ?  ?  ?  ?  ?  ?  ?  ? ?  ?Cognition Arousal/Alertness: Awake/alert ?Behavior During Therapy: [facility] for tasks assessed/performed ?Overall Cognitive Status: Impaired/Different from baseline ?Area of Impairment: Problem solving ?  ?  ?  ?  ?  ?  ?  ?  ?  ?Current Attention Level: Selective ?  ?Following Commands: Follows one step commands consistently ?  ?Awareness: Emergent ?Problem Solving: Slow processing ?  ?  ?  ? ?  ?Exercises   ? ?  ?General Comments   ?  ?  ? ?Pertinent Vitals/Pain Pain Assessment ?Pain Assessment: Faces ?Faces Pain Scale: No hurt ?Pain Intervention(s): Monitored during session  ? ? ?Home Living   ?  ?  ?  ?  ?  ?  ?  ?  ?  ?   ?  ?  Prior Function    ?  ?  ?   ? ?PT Goals (current goals can now be found in the care plan section) Acute Rehab PT Goals ?Patient Stated Goal: get better ?PT Goal Formulation: With patient/family ?Time For Goal Achievement: 08/06/21 ?Potential to Achieve Goals: Good ?Progress towards PT goals: Progressing toward goals ? ?  ?Frequency ? ? ? Min 4X/week ? ? ? ?  ?PT  Plan Current plan remains appropriate  ? ? ?Co-evaluation   ?  ?  ?  ?  ? ?  ?AM-PAC PT "6 Clicks" Mobility   ?Outcome Measure ? Help needed turning from your back to your side while in a flat bed without using bedrails?: A Little ?Help needed moving from lying on your back to sitting on the side of a flat bed without using bedrails?: A Little ?Help needed moving to and from a bed to a chair (including a wheelchair)?: A Lot ?Help needed standing up from a chair using your arms (e.g., wheelchair or bedside chair)?: A Little ?Help needed to walk in hospital room?: A Lot ?Help needed climbing 3-5 steps with a railing? : A Lot ?6 Click Score: 15 ? ?  ?End of Session   ?Activity Tolerance: Treatment limited secondary to medical complications (Comment) (orthostatic BP's continue) ?Patient left: in bed;with call bell/phone within reach;with bed alarm set;with family/visitor present ?Nurse Communication: Mobility status ?PT Visit Diagnosis: Unsteadiness on feet (R26.81);Other symptoms and signs involving the nervous system (R29.898);Difficulty in walking, not elsewhere classified (R26.2) ?  ? ? ?Time: 2878-6767 ?PT Time Calculation (min) (ACUTE ONLY): 20 min ? ?Charges:  $Gait Training: 8-22 mins          ?          ? ?07/25/2021 ? ?Ginger Carne., PT ?Acute Rehabilitation Services ?[phone]  (pager) ?[phone]  (office) ? ? ?[name] ?07/25/2021, 1:47 PM ? ?

## 2021-07-25 NOTE — Progress Notes (Signed)
Tele called she is having wide QRS Vtach 5 beats. Went to her room, she is sitting on her bed. Denies chest pain or discomfort or any unusual feeling. Verbalize she had some palpitations earlier but not at this time. Messaged Dr. Cyd Silence via Shea Evans. ?

## 2021-07-25 NOTE — H&P (Incomplete)
Physical Medicine and Rehabilitation Admission H&P   CC: Encephalopathy  HPI: JERA HEADINGS is a 81 year old right-handed female with history of anemia of chronic disease paroxysmal SVT, hypothyroidism, hypertension, CKD stage IV followed by Dr.Lehrich with nephrology at [facility] with creatinine baseline 2.0.  Per chart review patient lives alone.  1 level home 2 steps to enter.  Independent and active prior to admission.  Son and daughter-in-law are planning on moving in with her after discharge to provide assistance as needed.  Presented to [facility] 07/19/2021 after being found down on her left side with altered mental status and left-sided weakness.  Patient was tachycardic 124 with low-grade fever of 100.5 and blood pressure 154/121, oxygen saturations 100% room air.  Cranial CT scan showed large area of acute intraparenchymal hemorrhage within the right temporal parietal lobe 4.0 x 4.0 x 4.1 cm with surrounding vasogenic edema and mild effacement of the occipital horn and atrium of the right lateral ventricle.  Small amount of subarachnoid hemorrhage within [place] at the anterior right temporal lobe.  3 mm of right to left midline shift.  Admission chemistry sodium 126 chloride 97 glucose 157 BUN 41 creatinine 2.13, WBC 17,100, hemoglobin 10.2, lactic acid 3.5 troponin 55-33, blood cultures no growth to date, urinalysis negative nitrite, procalcitonin 0.32, COVID-19 and influenza testing negative.  CT of the chest without contrast showed significant motion artifact bilaterally however notable cylindrical and saccular bronchiectatic changes with chronic bronchitic airway bilaterally.  CT of chest abdomen pelvis showed bilateral hydronephrosis and proximal hydroureter.  MRI/MRA of the head 25 mL intraparenchymal hematoma centered at the right temporal occipital region.  No vascular abnormality seen underlying the right cerebral hemorrhage.  Echocardiogram with ejection fraction of 65 to 70% no wall motion  abnormalities grade 1 diastolic dysfunction.  Patient was placed on broad-spectrum intravenous antibiotic for suspected sepsis.  Urology services Dr. Diona Fanti consulted in regards to urinary retention/significant bilateral hydronephrosis recommended Foley catheter tube and Flomax x2 weeks and follow-up outpatient.  A follow-up renal ultrasound 07/24/2021 revealed no acute abnormality resolved bilateral hydronephrosis.  Renal services Dr. Moshe Cipro consulted in regards to longstanding CKD stage IV with baseline creatinine 2.0 elevating to 2.78 felt to be related to BP variability going from very high to very low normal.  Amlodipine and metoprolol discontinued and lattest creatinine 2.44.  Her antibiotic regimen has been transition to Levaquin 500 mg every 48 hours 07/23/2021 per PCCM x14 days.  RVP negative, blood cultures negative to date.  Anemia of chronic disease A's hemoglobin 6.9 awaiting plan transfusion.  She is tolerating a regular consistency diet.  Therapy evaluations completed due to patient decreased functional mobility left side weakness was admitted for a comprehensive rehab program. Currently complains of weakness.   Review of Systems  Constitutional:  Negative for chills and fever.  HENT:  Negative for hearing loss.   Eyes:  Negative for blurred vision and double vision.  Respiratory:  Negative for cough and shortness of breath.   Cardiovascular:  Positive for leg swelling. Negative for chest pain and palpitations.  Gastrointestinal:  Positive for constipation. Negative for heartburn, nausea and vomiting.  Genitourinary:  Positive for flank pain. Negative for dysuria and hematuria.  Musculoskeletal:  Positive for myalgias.  Skin:  Negative for rash.  Neurological:  Positive for speech change and weakness.  All other systems reviewed and are negative. No past medical history on file. No past surgical history on file. No family history on file. Social History:  has no  history on file  for tobacco use, alcohol use, and drug use. Allergies:  Allergies  Allergen Reactions   Ethambutol Other (See Comments)    Patient reports cramping   Iodinated Contrast Media Rash   Medications Prior to Admission  Medication Sig Dispense Refill   albuterol (PROVENTIL) (2.5 MG/3ML) 0.083% nebulizer solution Take 2.5 mg by nebulization every 6 (six) hours as needed for wheezing.     albuterol (VENTOLIN HFA) 108 (90 Base) MCG/ACT inhaler Inhale 2 puffs into the lungs every 4 (four) hours as needed for wheezing.     fluticasone-salmeterol (ADVAIR DISKUS) 250-50 MCG/ACT AEPB Inhale 1 puff into the lungs every 12 (twelve) hours.     levofloxacin (LEVAQUIN) 500 MG/100ML SOLN Inject 100 mLs (500 mg total) into the vein every other day. 1000 mL    levothyroxine (SYNTHROID) 75 MCG tablet Take 75 mcg by mouth daily.     niCARdipine in saline (CARDENE-IV) 20-0.86 MG/200ML-% SOLN Inject 3-15 mg/hr into the vein continuous.        Home: Home Living Family/patient expects to be discharged to:: Private residence Living Arrangements: Alone Available Help at Discharge: Family, Available 24 hours/day Type of Home: House Home Access: Stairs to enter Technical brewer of Steps: 2 Entrance Stairs-Rails: None Home Layout: One level Bathroom Shower/Tub: Multimedia programmer: Programmer, systems: Yes Home Equipment: None  Lives With: Alone   Functional History: Prior Function Prior Level of Function : Independent/Modified Independent Mobility Comments: driving, yard work, mowing the yard ADLs Comments: independent  Functional Status:  Mobility: Bed Mobility Overal bed mobility: Needs Assistance Bed Mobility: Supine to Sit, Sit to Supine Rolling: Min assist Sidelying to sit: Min assist Supine to sit: Min guard Sit to supine: Min guard General bed mobility comments: min cuing for sequencing and direction.  pt not needing physical assist Transfers Overall transfer  level: Needs assistance Transfers: Sit to/from Stand, Bed to chair/wheelchair/BSC Sit to Stand: Min assist Bed to/from chair/wheelchair/BSC transfer type:: Stand pivot Stand pivot transfers: Min assist Step pivot transfers: Min assist General transfer comment: cues for hand placement, helping pt determine when to sit down.  BP in standing 2nd trial 89/63  symptomatic and significantly worse symptoms 2nd trial. Ambulation/Gait Ambulation/Gait assistance: Min assist Gait Distance (Feet): 3 Feet (x2 with min A while feeling well, light mod when pt with BP symptoms.) Assistive device: 1 person hand held assist Gait Pattern/deviations: Step-to pattern General Gait Details: mildly unsteady, tentative steps, a little wider BOS.  Pt showing most incoordination with w/shift L to move R LE. Gait velocity interpretation: <1.31 ft/sec, indicative of household ambulator Pre-gait activities: At bedside working on w/shift and stepping until symptomatic with low BP    ADL: ADL Overall ADL's : Needs assistance/impaired Grooming: Wash/dry hands, Wash/dry face, Set up, Sitting Upper Body Bathing: Min guard, Sitting Lower Body Bathing: Moderate assistance, Sit to/from stand Upper Body Dressing : Minimal assistance, Sitting Lower Body Dressing: Moderate assistance, Sit to/from stand Toilet Transfer: Minimal assistance, BSC/3in1, Stand-pivot  Cognition: Cognition Overall Cognitive Status: Impaired/Different from baseline Arousal/Alertness: Awake/alert Orientation Level: Oriented X4 Year:  (initially 2013, self corrected) Month: March Day of Week: Incorrect Attention: Sustained Sustained Attention: Appears intact (suspect she will breakdown in function) Memory:  (TBA) Awareness: Impaired Awareness Impairment: Intellectual impairment, Anticipatory impairment Problem Solving:  (very basic intact) Safety/Judgment: Impaired Cognition Arousal/Alertness: Awake/alert Behavior During Therapy: WFL for  tasks assessed/performed Overall Cognitive Status: Impaired/Different from baseline Area of Impairment: Problem solving Current Attention Level: Selective Following Commands:  Follows one step commands consistently, Follows one step commands with increased time Safety/Judgement: Decreased awareness of safety, Decreased awareness of deficits Awareness: Emergent Problem Solving: Slow processing General Comments: inattension improved  Physical Exam: Blood pressure 140/72, pulse 100, temperature 98.1 F (36.7 C), temperature source Oral, resp. rate 15, SpO2 96 %. Physical Exam Genitourinary:    Comments: Foley tube in place Neurological:     Comments: Patient is alert.  She appears somewhat anxious.  Makes eye contact with examiner and follows commands.  Provides name and age.   Results for orders placed or performed during the hospital encounter of 07/21/21 (from the past 48 hour(s))  CBC     Status: Abnormal   Collection Time: 07/24/21 12:43 AM  Result Value Ref Range   WBC 6.5 4.0 - 10.5 K/uL   RBC 2.29 (L) 3.87 - 5.11 MIL/uL   Hemoglobin 7.1 (L) 12.0 - 15.0 g/dL   HCT 22.2 (L) 36.0 - 46.0 %   MCV 96.9 80.0 - 100.0 fL   MCH 31.0 26.0 - 34.0 pg   MCHC 32.0 30.0 - 36.0 g/dL   RDW 13.6 11.5 - 15.5 %   Platelets 203 150 - 400 K/uL   nRBC 0.0 0.0 - 0.2 %    Comment: Performed at [facility], [place] [address] [place], [place] [address]  Basic metabolic panel     Status: Abnormal   Collection Time: 07/24/21 12:43 AM  Result Value Ref Range   Sodium 134 (L) 135 - 145 mmol/L   Potassium 3.8 3.5 - 5.1 mmol/L   Chloride 108 98 - 111 mmol/L   CO2 18 (L) 22 - 32 mmol/L   Glucose, Bld 109 (H) 70 - 99 mg/dL    Comment: Glucose reference range applies only to samples taken after fasting for at least 8 hours.   BUN 40 (H) 8 - 23 mg/dL   Creatinine, Ser 2.78 (H) 0.44 - 1.00 mg/dL   Calcium 7.5 (L) 8.9 - 10.3 mg/dL   GFR, Estimated 17 (L) >60 mL/min    Comment: (NOTE) Calculated  using the CKD-EPI Creatinine Equation (2021)    Anion gap 8 5 - 15    Comment: Performed at [facility] [address], [place], [place] [address]  Urinalysis, Routine w reflex microscopic Urine, Catheterized     Status: Abnormal   Collection Time: 07/24/21  1:46 PM  Result Value Ref Range   Color, Urine STRAW (A) YELLOW   APPearance CLEAR CLEAR   Specific Gravity, Urine 1.005 1.005 - 1.030   pH 5.0 5.0 - 8.0   Glucose, UA NEGATIVE NEGATIVE mg/dL   Hgb urine dipstick SMALL (A) NEGATIVE   Bilirubin Urine NEGATIVE NEGATIVE   Ketones, ur NEGATIVE NEGATIVE mg/dL   Protein, ur 100 (A) NEGATIVE mg/dL   Nitrite NEGATIVE NEGATIVE   Leukocytes,Ua NEGATIVE NEGATIVE   WBC, UA 0-5 0 - 5 WBC/hpf   Bacteria, UA RARE (A) NONE SEEN   WBC Clumps PRESENT    Mucus PRESENT     Comment: Performed at [facility] [address], [place], [place] [address]  Protein / creatinine ratio, urine     Status: Abnormal   Collection Time: 07/24/21  1:46 PM  Result Value Ref Range   Creatinine, Urine 40.12 mg/dL   Total Protein, Urine 73 mg/dL    Comment: NO NORMAL RANGE ESTABLISHED FOR THIS TEST   Protein Creatinine Ratio 1.82 (H) 0.00 - 0.15 mg/mg[Cre]  Comment: Performed at [facility], [place] [address], [place], [place] [address]   US RENAL  Result Date: 07/24/2021 CLINICAL DATA:  Acute kidney injury. EXAM: RENAL / URINARY TRACT ULTRASOUND COMPLETE COMPARISON:  CT abdomen and pelvis dated July 20, 2021. FINDINGS: Right Kidney: Renal measurements: 10.2 x 4.4 x 3.7 cm = volume: 86 mL. Increased echogenicity. No mass or hydronephrosis visualized. Multiple cysts measuring up to 1.7 cm. Left Kidney: Renal measurements: 9.7 x 5.2 x 4.3 cm = volume: 113 mL. Increased echogenicity. No mass or hydronephrosis visualized. Multiple cysts measuring up to 1.6 cm. Bladder: Decompressed by Foley catheter. Other: None. IMPRESSION: 1. No acute abnormality. Resolved bilateral hydronephrosis after Foley  catheter placement. 2. Increased bilateral renal echogenicity, consistent with medical renal disease. Electronically Signed   By: Titus Dubin M.D.   On: 07/24/2021 15:31   DG Swallowing Func-Speech Pathology  Result Date: 07/24/2021 Table formatting from the original result was not included. Objective Swallowing Evaluation: Type of Study: MBS-Modified Barium Swallow Study  Patient Details Name: SAKINAH ROSAMOND MRN: [id number] Date of Birth: Sep 10, 1940 Today's Date: 07/24/2021 Time: SLP Start Time (ACUTE ONLY): 50 -SLP Stop Time (ACUTE ONLY): 1247 SLP Time Calculation (min) (ACUTE ONLY): 17 min Past Medical History: No past medical history on file. Past Surgical History: No past surgical history on file. HPI: 81 yo F with end-stage renal failure with CKD, cardiac dysfunction with arrhythmia and SVT, hypothyroidism, moderate to severe protein calorie malnutrition, chronic bronchiectasis, She was admitted with altered mental status and confusion. She was found to have leukocytosis with elevated lactate and metabolic acidosis. Chest CT with cylindrical and saccular bronchiectatic changes with chronic bronchitic airways bilaterally with mucoid impaction and bibasilar fibrotic changes and large hiatal hernia. MRI 25 mL intraparenchymal hematoma centered at the right temporoccipital region. associated small volume subarachnoid hemorrhage within the adjacent right cerebral hemisphere, underlying age-related cerebral atrophy with moderately advanced  chronic microvascular ischemic disease, small remote left cerebellar infarct.  Subjective: Pt alert, confusion evident. Yelling for people who are not present in room. Eyes closed intermittently. Cleared with RN. Son present.  Recommendations for follow up therapy are one component of a multi-disciplinary discharge planning process, led by the attending physician.  Recommendations may be updated based on patient status, additional functional criteria and insurance  authorization. Assessment / Plan / Recommendation Clinical Impressions 07/24/2021 Clinical Impression Pt was seen in radiology suite for modified barium swallow study. Trials of puree solids, regular texture solids, a 76mm barium tablet, and thin liquids via cup and straw were administered. Pt presented with mild pharyngoesophageal dysphagia characterized by reduction in pharyngeal constriction, and PES distension. A CP bar was intermittently noted and residue was noted at the level of the PES. Solid residue was cleared with a liquid wash and liquid residue was reduced with secondary swallows. It is recommended that the pt's current diet of regular solids and thin liquids be continued at this time. SLP will see patient once more for education; however, it is anticipated that further skilled SLP services for swallowing will likely not be needed beyond that. SLP Visit Diagnosis Dysphagia, pharyngoesophageal phase (R13.14) Attention and concentration deficit following -- Frontal lobe and executive function deficit following -- Impact on safety and function Mild aspiration risk   Treatment Recommendations 07/24/2021 Treatment Recommendations Therapy as outlined in treatment plan below   Prognosis 07/24/2021 Prognosis for Safe Diet Advancement Good Barriers to Reach Goals Cognitive deficits Barriers/Prognosis Comment -- Diet Recommendations 07/24/2021 SLP Diet Recommendations Regular  solids;Thin liquid Liquid Administration via Cup;Straw Medication Administration Whole meds with liquid Compensations Small sips/bites;Slow rate Postural Changes Seated upright at 90 degrees   Other Recommendations 07/24/2021 Recommended Consults -- Oral Care Recommendations Oral care BID Other Recommendations -- Follow Up Recommendations Acute inpatient rehab (3hours/day) Assistance recommended at discharge Frequent or constant Supervision/Assistance Functional Status Assessment Patient has had a recent decline in their functional status and  demonstrates the ability to make significant improvements in function in a reasonable and predictable amount of time. Frequency and Duration  07/24/2021 Speech Therapy Frequency (ACUTE ONLY) min 1 x/week Treatment Duration 1 week   Oral Phase 07/24/2021 Oral Phase WFL Oral - Pudding Teaspoon -- Oral - Pudding Cup -- Oral - Honey Teaspoon -- Oral - Honey Cup -- Oral - Nectar Teaspoon -- Oral - Nectar Cup -- Oral - Nectar Straw -- Oral - Thin Teaspoon -- Oral - Thin Cup -- Oral - Thin Straw -- Oral - Puree -- Oral - Mech Soft -- Oral - Regular -- Oral - Multi-Consistency -- Oral - Pill -- Oral Phase - Comment --  Pharyngeal Phase 07/24/2021 Pharyngeal Phase WFL Pharyngeal- Pudding Teaspoon -- Pharyngeal -- Pharyngeal- Pudding Cup -- Pharyngeal -- Pharyngeal- Honey Teaspoon -- Pharyngeal -- Pharyngeal- Honey Cup -- Pharyngeal -- Pharyngeal- Nectar Teaspoon -- Pharyngeal -- Pharyngeal- Nectar Cup -- Pharyngeal -- Pharyngeal- Nectar Straw -- Pharyngeal -- Pharyngeal- Thin Teaspoon -- Pharyngeal -- Pharyngeal- Thin Cup Pharyngeal residue - cp segment;Reduced pharyngeal peristalsis Pharyngeal -- Pharyngeal- Thin Straw Pharyngeal residue - cp segment;Reduced pharyngeal peristalsis Pharyngeal -- Pharyngeal- Puree Pharyngeal residue - cp segment;Reduced pharyngeal peristalsis Pharyngeal -- Pharyngeal- Mechanical Soft -- Pharyngeal -- Pharyngeal- Regular -- Pharyngeal -- Pharyngeal- Multi-consistency -- Pharyngeal -- Pharyngeal- Pill -- Pharyngeal -- Pharyngeal Comment --  Cervical Esophageal Phase  07/24/2021 Cervical Esophageal Phase Impaired Pudding Teaspoon -- Pudding Cup -- Honey Teaspoon -- Honey Cup -- Nectar Teaspoon -- Nectar Cup -- Nectar Straw -- Thin Teaspoon -- Thin Cup Prominent cricopharyngeal segment;Reduced cricopharyngeal relaxation Thin Straw Prominent cricopharyngeal segment;Reduced cricopharyngeal relaxation Puree -- Mechanical Soft -- Regular -- Multi-consistency -- Pill -- Cervical Esophageal Comment -- Tobie Poet  I. Hardin Negus, [place], [place] Office number [phone] Pager [phone] Horton Marshall 07/24/2021, 2:07 PM                        Blood pressure 140/72, pulse 100, temperature 98.1 F (36.7 C), temperature source Oral, resp. rate 15, SpO2 96 %.  Medical Problem List and Plan: 1. Functional deficits secondary to right parietal lobe ICH with adjacent SAH and trace IVH suspect related to fall versus hypertensive crisis  -patient may  shower  -ELOS/Goals:  2.  Antithrombotics: -DVT/anticoagulation:  Mechanical: Antiembolism stockings, thigh (TED hose) Bilateral lower extremities  -antiplatelet therapy: N/A 3. Pain Management: Tylenol as needed 4. Mood: Provide emotional support  -antipsychotic agents: N/A 5. Neuropsych: This patient is capable of making decisions on her own behalf. 6. Skin/Wound Care: Routine skin checks 7. Fluids/Electrolytes/Nutrition: Routine in and outs with follow-up chemistries 8.  CAP with baseline bronchiectasis.  Follow-up PCCM.  Complete 14-day course Levaquin initiated 07/23/2021.  Continue inhalers as directed 9.  Acute urinary retention with significant bilateral hydronephrosis.  Follow-up 07/23/2021 Dr. Diona Fanti.  Recommend CONTINUE FOLEY CATHETER TUBE  and Flomax for at least 2 weeks and follow-up outpatient.  Follow-up renal ultrasound 07/24/2021 showed resolved bilateral hydronephrosis 10.  AKI on CKD stage IV.  Patient followed by Dr.Lehrich at [facility] with  baseline creatinine 2.0.  Currently seen by Dr. Moshe Cipro while at [facility].  Currently with worsening of renal function suspect related to BP variability with amlodipine and metoprolol discontinued.  Follow-up chemistries 11.  Hypertension.  Amlodipine and Lopressor currently discontinued.  Continue to monitor 12.  Hypothyroidism.  Synthroid 13.  History of cardiac dysfunction with SVT.  Cardiac rate controlled. 14.  Anemia of chronic disease.  Follow-up CBC.   Transfused 2 units 07/25/2021 patient had been receiving Aranesp every 2 weeks  Cathlyn Parsons, PA-C 07/25/2021

## 2021-07-25 NOTE — Progress Notes (Signed)
.rdr          [facility]                                                                              Patient Demographics  Lauren Conner, is a 81 y.o. female, DOB - Sep 24, 1940, FUX:[id number]  Admit date - 07/21/2021   Admitting Physician Jacky Kindle, MD  Outpatient Primary MD for the patient is [place], [facility]  Outpatient specialists:   LOS - 4  days   Medical records reviewed and are as summarized below:          Brief summary   Patient is a 81 year old female with history of SVT, HTN, bronchiectasis, MAI infection followed at [facility] and completed 2 years of Zithromax and rifampin, CKD stage IV initially presented to [facility] after a fall with altered mental status.  Per family she is normally mobile and very independent.  Family found her confused, on the floor with bruising to the face and was brought to ED.  Patient was found to be encephalopathic, febrile.  Chest x-ray with multifocal opacities.  She was admitted, remained encephalopathic hence CT head was obtained which showed large right temporoparietal IPH with 3 mm midline shift and vasogenic edema.  Patient was transferred to [facility] for further management. 3/5 presented to [facility], admitted to [facility] 3/6 txfr to [facility] for 3% after [place] showed large IPH with edema and midline shift 3/7 Looks better this morning, awake and passed swallow  3/8 TRH assumed care at [place] Problem: Acute encephalopathy, acute right temporoparietal IPH in the setting of fall -Possibly hypertensive although may be traumatic.  Neurology following. -MRI/MRA without contrast do not suggest underlying vascular abnormality. -Repeat CT head 3/7 showed stable appearance of right parietal and temporal lobe hemorrhage with surrounding edema and mass effect.  Stable subarachnoid blood along the right temporal lobe. -Not able to perform MRI or CT with contrast due to elevated creatinine -MRA showed mild atheromatous irregularity  about the carotid systems without hemodynamically significant or correctable stenosis.  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 -2D echo showed EF of 65 to 70% -Currently alert and oriented, mental status at baseline. -PT recommended CIR - CT head done per family's request, unchanged right cerebral hemorrhage with adjacent edema, midline shift measures 3 mm  Active Problems:   Pneumonia of both lungs due to infectious organism, in the setting of chronic bronchiectasis -Followed at [facility], currently not on chronic antibiotics -Patient followed by PCCM, recommended Levaquin for 14 days for likely CAP with possible bronchiectasis exacerbation -RVP negative, blood cultures negative to date -Continue Yupelri and Brovana nebs.  Recommended to stop outpatient inhaled steroids in the setting of bronchiectasis     Acute urinary retention with significant bilateral hydronephrosis -Patient was found to have acute kidney injury on CKD stage IV, UA negative for UTI - CT abdomen pelvis showed marked severity bilateral hydronephrosis, proximal hydroureter in the setting of markedly distended urinary bladder -Foley catheter was placed, continue Flomax -Discussed with urology on-call, Dr. Diona Fanti, on 3/8 recommended continue Foley catheter and Flomax.  Patient will need at least 2 weeks,  recommended follow-up in the office for voiding trial -CT abdomen today showed decompression of the bladder and resolution of the hydronephrosis  Acute kidney injury on CKD stage IV with NAG metabolic acidosis -Follows nephrology at [facility], baseline creatinine around 2 (BUN 40, creatinine 2.1 per labs on 07/04/2021 - [facility]) -Creatinine 2.1 on 07/19/2021, nephrology was consulted due to creatinine worsening -Patient was placed on IV fluid hydration, increased yesterday and has been drinking plenty of fluids, creatinine improved to 2.4 (2.7 on 3/9) -Receiving 2 units of blood, IV fluids discontinued   Acute  on chronic anemia, likely due to CKD -No obvious bleeding, no hematuria, hematemesis, hematochezia or melena.  Possible worsening due to hemodilution, has received aggressive IV fluid hydration in the last 48 to 48 hours. -Hemoglobin 6.9, transfused 2 units packed RBC, follow H&H.  Baseline hemoglobin around 9 -Received ESA on 3/9 -CT abdomen obtained, ruled out [facility], has diverticulosis. -Anemia panel consistent with anemia of chronic disease, follow FOBT, continue PPI   Hypothyroidism -Continue Synthroid   Moderate protein calorie malnutrition Estimated body mass index is 17.69 kg/m as calculated from the following:   Height as of 07/19/21: 5\' 3"  (1.6 m).   Weight as of 07/19/21: 45.3 kg.  Code Status: Full CODE STATUS DVT Prophylaxis:  Place TED hose Start: 07/23/21 1309 SCD's Start: 07/21/21 1713   Level of Care: Level of care: Progressive Family Communication: Discussed all imaging results, lab results, explained to the patient's niece at the bedside today   Disposition Plan:     Status is: Inpatient Remains inpatient appropriate because: Work-up in progress, not safe for discharge.     Time Spent in minutes 35  Procedures:  MRI brain  Consultants:   PCCM Neurology Nephrology  Antimicrobials:  Levaquin 500 mg daily 07/21/2021--->     Medications  Scheduled Meds:  sodium chloride   Intravenous Once   arformoterol  15 mcg Nebulization BID   Chlorhexidine Gluconate Cloth  6 each Topical Daily   levofloxacin  500 mg Oral Q48H   levothyroxine  75 mcg Oral Q0600   pantoprazole  40 mg Oral Daily   polyethylene glycol  17 g Oral Daily   revefenacin  175 mcg Nebulization Daily   senna-docusate  1 tablet Oral BID   sodium bicarbonate  650 mg Oral TID   tamsulosin  0.4 mg Oral Daily   Continuous Infusions:  sodium chloride Stopped (07/25/21 1021)   PRN Meds:.acetaminophen **OR** acetaminophen (TYLENOL) oral liquid 160 mg/5 mL **OR** acetaminophen, ondansetron  (ZOFRAN) IV, sodium chloride      Subjective:   Maggy Wyble was seen and examined today.  Has been drinking a lot of water.  Niece at the bedside, mental status at baseline, alert and oriented.  No abdominal pain, nausea vomiting.  Constipated.  No obvious bleeding.  Niece at the bedside.    Objective:   Vitals:   07/25/21 0736 07/25/21 1145 07/25/21 1202 07/25/21 1217  BP:  105/71 (!) 144/67 134/70  Pulse:  95 85 86  Resp:  16 16 16   Temp:  (!) 97.5 F (36.4 C) (!) 97.4 F (36.3 C) (!) 97.4 F (36.3 C)  TempSrc:  Oral Oral Oral  SpO2: 96% 97% 98%     Intake/Output Summary (Last 24 hours) at 07/25/2021 1410 Last data filed at 07/25/2021 1021 Gross per 24 hour  Intake 2151.09 ml  Output 2200 ml  Net -48.91 ml     Wt Readings from Last 3 Encounters:  07/19/21 45.3  kg    Physical Exam General: Alert and oriented x 3, NAD Cardiovascular: S1 S2 clear, RRR. No pedal edema b/l Respiratory: Fine bibasilar crackles Gastrointestinal: Soft, nontender, nondistended, NBS Ext: no pedal edema bilaterally Neuro: no new deficits   Data Reviewed:  I have personally reviewed following labs and imaging studies  Micro Results Recent Results (from the past 240 hour(s))  Resp Panel by RT-PCR (Flu A&B, Covid) Nasopharyngeal Swab     Status: None   Collection Time: 07/19/21  9:17 PM   Specimen: Nasopharyngeal Swab; Nasopharyngeal(NP) swabs in vial transport medium  Result Value Ref Range Status   SARS Coronavirus 2 by RT PCR NEGATIVE NEGATIVE Final    Comment: (NOTE) SARS-CoV-2 target nucleic acids are NOT DETECTED.  The SARS-CoV-2 RNA is generally detectable in upper respiratory specimens during the acute phase of infection. The lowest concentration of SARS-CoV-2 viral copies this assay can detect is 138 copies/mL. A negative result does not preclude SARS-Cov-2 infection and should not be used as the sole basis for treatment or other patient management decisions. A negative result  may occur with  improper specimen collection/handling, submission of specimen other than nasopharyngeal swab, presence of viral mutation(s) within the areas targeted by this assay, and inadequate number of viral copies(<138 copies/mL). A negative result must be combined with clinical observations, patient history, and epidemiological information. The expected result is Negative.  Fact Sheet for Patients:  EntrepreneurPulse.com.au  Fact Sheet for Healthcare Providers:  IncredibleEmployment.be  This test is no t yet approved or cleared by the [place] FDA and  has been authorized for detection and/or diagnosis of SARS-CoV-2 by FDA under an Emergency Use Authorization (EUA). This EUA will remain  in effect (meaning this test can be used) for the duration of the COVID-19 declaration under Section 564(b)(1) of the Act, 21 U.S.C.section 360bbb-3(b)(1), unless the authorization is terminated  or revoked sooner.       Influenza A by PCR NEGATIVE NEGATIVE Final   Influenza B by PCR NEGATIVE NEGATIVE Final    Comment: (NOTE) The Xpert Xpress SARS-CoV-2/FLU/RSV plus assay is intended as an aid in the diagnosis of influenza from Nasopharyngeal swab specimens and should not be used as a sole basis for treatment. Nasal washings and aspirates are unacceptable for Xpert Xpress SARS-CoV-2/FLU/RSV testing.  Fact Sheet for Patients: EntrepreneurPulse.com.au  Fact Sheet for Healthcare Providers: IncredibleEmployment.be  This test is not yet approved or cleared by the [place] FDA and has been authorized for detection and/or diagnosis of SARS-CoV-2 by FDA under an Emergency Use Authorization (EUA). This EUA will remain in effect (meaning this test can be used) for the duration of the COVID-19 declaration under Section 564(b)(1) of the Act, 21 U.S.C. section 360bbb-3(b)(1), unless the authorization is terminated  or revoked.  Performed at [facility], [address] [place], [place] [address]   Culture, blood (Routine x 2)     Status: None   Collection Time: 07/19/21  9:23 PM   Specimen: BLOOD  Result Value Ref Range Status   Specimen Description BLOOD RIGHT ANTECUBITAL  Final   Special Requests   Final    BOTTLES DRAWN AEROBIC AND ANAEROBIC Blood Culture adequate volume   Culture   Final    NO GROWTH 5 DAYS Performed at [facility], [address], [place], [place] [address]    Report Status 07/24/2021 FINAL  Final  Culture, blood (Routine x 2)     Status: None   Collection Time: 07/19/21  9:28 PM   Specimen: BLOOD  Result Value Ref Range Status   Specimen Description BLOOD BLOOD LEFT FOREARM  Final   Special Requests   Final    BOTTLES DRAWN AEROBIC AND ANAEROBIC Blood Culture results may not be optimal due to an excessive volume of blood received in culture bottles   Culture   Final    NO GROWTH 5 DAYS Performed at [facility], [address] [place], [place] [address]    Report Status 07/24/2021 FINAL  Final  Urine Culture     Status: None   Collection Time: 07/20/21  7:45 AM   Specimen: Urine, Clean Catch  Result Value Ref Range Status   Specimen Description   Final    URINE, CLEAN CATCH Performed at [facility], [address] [place], [place] [address]    Special Requests   Final    NONE Performed at [facility], [address] [place], [place] [address]    Culture   Final    NO GROWTH Performed at [facility], [place] [address], [place], [place] [address]    Report Status 07/21/2021 FINAL  Final  Respiratory (~20 pathogens) panel by PCR     Status: None   Collection Time: 07/20/21  8:15 AM   Specimen: Nasopharyngeal Swab; Respiratory  Result Value Ref Range Status   Adenovirus NOT DETECTED NOT DETECTED Final   Coronavirus 229E NOT DETECTED NOT DETECTED Final    Comment: (NOTE) The Coronavirus on the  Respiratory Panel, DOES NOT test for the novel  Coronavirus (2019 nCoV)    Coronavirus HKU1 NOT DETECTED NOT DETECTED Final   Coronavirus NL63 NOT DETECTED NOT DETECTED Final   Coronavirus OC43 NOT DETECTED NOT DETECTED Final   Metapneumovirus NOT DETECTED NOT DETECTED Final   Rhinovirus / Enterovirus NOT DETECTED NOT DETECTED Final   Influenza A NOT DETECTED NOT DETECTED Final   Influenza B NOT DETECTED NOT DETECTED Final   Parainfluenza Virus 1 NOT DETECTED NOT DETECTED Final   Parainfluenza Virus 2 NOT DETECTED NOT DETECTED Final   Parainfluenza Virus 3 NOT DETECTED NOT DETECTED Final   Parainfluenza Virus 4 NOT DETECTED NOT DETECTED Final   Respiratory Syncytial Virus NOT DETECTED NOT DETECTED Final   Bordetella pertussis NOT DETECTED NOT DETECTED Final   Bordetella Parapertussis NOT DETECTED NOT DETECTED Final   Chlamydophila pneumoniae NOT DETECTED NOT DETECTED Final   Mycoplasma pneumoniae NOT DETECTED NOT DETECTED Final    Comment: Performed at [facility], [place] [address] [place], [place] [address]  MRSA Next Gen by PCR, Nasal     Status: None   Collection Time: 07/20/21  8:15 AM   Specimen: Nasopharyngeal Swab; Nasal Swab  Result Value Ref Range Status   MRSA by PCR Next Gen NOT DETECTED NOT DETECTED Final    Comment: (NOTE) The GeneXpert MRSA Assay (FDA approved for NASAL specimens only), is one component of a comprehensive MRSA colonization surveillance program. It is not intended to diagnose MRSA infection nor to guide or monitor treatment for MRSA infections. Test performance is not FDA approved in patients less than 73 years old. Performed at [facility], [address] [place], [place] [address]   MRSA Next Gen by PCR, Nasal     Status: None   Collection Time: 07/21/21  5:44 PM   Specimen: Nasal Mucosa; Nasal Swab  Result Value Ref Range Status   MRSA by PCR Next Gen NOT DETECTED NOT DETECTED Final  Comment: (NOTE) The GeneXpert MRSA Assay  (FDA approved for NASAL specimens only), is one component of a comprehensive MRSA colonization surveillance program. It is not intended to diagnose MRSA infection nor to guide or monitor treatment for MRSA infections. Test performance is not FDA approved in patients less than 95 years old. Performed at [facility], [place] [address], [place], [place] [address]     Radiology Reports CT ABDOMEN PELVIS WO CONTRAST  Result Date: 07/25/2021 CLINICAL DATA:  Abdominal pain, acute, nonlocalized anemia, unclear etiology, r/o RPH EXAM: CT ABDOMEN AND PELVIS WITHOUT CONTRAST TECHNIQUE: Multidetector CT imaging of the abdomen and pelvis was performed following the standard protocol without IV contrast. RADIATION DOSE REDUCTION: This exam was performed according to the departmental dose-optimization program which includes automated exposure control, adjustment of the mA and/or kV according to patient size and/or use of iterative reconstruction technique. COMPARISON:  CT 07/20/2021 FINDINGS: Lower chest: Large hiatal hernia. 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 Small bilateral pleural effusions. Hepatobiliary: Unchanged hepatic cysts. The gallbladder is unremarkable. Pancreas: Unremarkable. No pancreatic ductal dilatation or surrounding inflammatory changes. Spleen: Normal in size.  Multiple calcified splenic granulomas. Adrenals/Urinary Tract: Adrenal glands are unremarkable. Interval placement of the Foley catheter with decompression of the bladder and renal collecting systems. Unchanged multiple bilateral renal cysts. Stomach/Bowel: Large hiatal hernia with intrathoracic stomach. There is no evidence of bowel obstruction.There is retained barium within the colon. The appendix is not definitively visualized. Extensive sigmoid diverticulosis without evidence of acute diverticulitis.  Vascular/Lymphatic: Aortoiliac atherosclerotic calcifications. No AAA. No lymphadenopathy. Reproductive: Unremarkable. Other: No free air. No abdominopelvic ascites. No evidence of retroperitoneal hematoma as clinically questioned. Musculoskeletal: No acute osseous abnormality. No suspicious lytic or blastic lesions. Multilevel degenerative changes of the spine. IMPRESSION: No acute abdominopelvic abnormality. Interval Foley catheter placement with decompression of the bladder and renal collecting systems. Colonic diverticulosis. Unchanged large hiatal hernia. Unchanged parenchymal findings the lung bases consistent with chronic atypical mycobacterial infection. Small bilateral pleural effusions. Electronically Signed   By: Maurine Simmering M.D.   On: 07/25/2021 10:56   CT ABDOMEN PELVIS WO CONTRAST  Result Date: 07/20/2021 CLINICAL DATA:  Altered mental status with fever, tachycardia and tachypnea. EXAM: CT ABDOMEN AND PELVIS WITHOUT CONTRAST TECHNIQUE: Multidetector CT imaging of the abdomen and pelvis was performed following the standard protocol without IV contrast. RADIATION DOSE REDUCTION: This exam was performed according to the departmental dose-optimization program which includes automated exposure control, adjustment of the mA and/or kV according to patient size and/or use of iterative reconstruction technique. COMPARISON:  None. FINDINGS: Lower chest: The study is markedly limited secondary to patient motion. Marked severity multifocal infiltrates and extensive bronchiectasis are seen throughout both lung bases. Hepatobiliary: 13 mm and 18 mm diameter cysts are seen within the anterior aspect of the right lobe of the liver. A 10 mm cystic appearing area is seen within the inferior aspect of the right lobe. There is mild central intrahepatic biliary dilatation. The gallbladder is mildly distended without evidence of gallstones, gallbladder wall thickening or pericholecystic fluid. Pancreas: Unremarkable. No  pancreatic ductal dilatation or surrounding inflammatory changes. Spleen: Punctate calcified granulomas are seen scattered throughout the parenchyma of a small spleen. Adrenals/Urinary Tract: Adrenal glands are unremarkable. Kidneys are normal in size. A 2.0 cm diameter cyst is seen along the posterior aspect of the mid left kidney. There is marked severity bilateral hydronephrosis and proximal  hydroureter without obstructing renal calculi. Bilateral nonspecific perinephric inflammatory fat stranding is seen. The urinary bladder is markedly distended. Stomach/Bowel: There is a large hiatal hernia. Appendix appears normal. No evidence of bowel wall thickening, distention, or inflammatory changes. Noninflamed diverticula are seen throughout the large bowel. Vascular/Lymphatic: Mild aortic atherosclerosis. No enlarged abdominal or pelvic lymph nodes. Reproductive: Uterus and bilateral adnexa are unremarkable. Other: No abdominal wall hernia or abnormality. No abdominopelvic ascites. Musculoskeletal: Marked severity multilevel degenerative changes are seen throughout the lumbar spine. IMPRESSION: 1. Marked severity bibasilar multifocal infiltrates. 2. Marked severity bilateral hydronephrosis and proximal hydroureter in the setting of a markedly distended urinary bladder. 3. Large hiatal hernia. 4. Colonic diverticulosis. 5. Marked severity multilevel degenerative changes throughout the lumbar spine. 6. Aortic atherosclerosis. Aortic Atherosclerosis (ICD10-I70.0). Electronically Signed   By: Virgina Norfolk M.D.   On: 07/20/2021 02:02   CT HEAD WO CONTRAST (5MM)  Result Date: 07/25/2021 CLINICAL DATA:  Subarachnoid hemorrhage follow-up EXAM: CT HEAD WITHOUT CONTRAST TECHNIQUE: Contiguous axial images were obtained from the base of the skull through the vertex without intravenous contrast. RADIATION DOSE REDUCTION: This exam was performed according to the departmental dose-optimization program which includes  automated exposure control, adjustment of the mA and/or kV according to patient size and/or use of iterative reconstruction technique. COMPARISON:  Three days ago FINDINGS: Brain: Unchanged shape and extent of parenchymal hemorrhage in the right temporal and parietal lobes with irregular shape limiting accurate measurement. Small volume overlying subarachnoid blood. Midline shift measures 3 mm. No entrapment or interval infarct Vascular: Atheromatous calcification Skull: Normal. Negative for fracture or focal lesion. Sinuses/Orbits: No acute finding. IMPRESSION: Unchanged right cerebral hemorrhage with adjacent edema. Midline shift measures 3 mm. Electronically Signed   By: Jorje Guild M.D.   On: 07/25/2021 11:28   CT HEAD WO CONTRAST  Result Date: 07/22/2021 CLINICAL DATA:  Intracranial hemorrhage. EXAM: CT HEAD WITHOUT CONTRAST TECHNIQUE: Contiguous axial images were obtained from the base of the skull through the vertex without intravenous contrast. RADIATION DOSE REDUCTION: This exam was performed according to the departmental dose-optimization program which includes automated exposure control, adjustment of the mA and/or kV according to patient size and/or use of iterative reconstruction technique. COMPARISON:  CT head without contrast 07/21/2021. MR head and MRA head 07/21/2021. FINDINGS: Brain: Hemorrhage involving the right parietal and temporal lobe is not significantly changed in size, measuring 4.0 x 3.2 x 4.4 cm. Blood products have begun to lay are. Surrounding edema and mass effect is similar the prior studies. Effacement of the sulci the posterior horn of the right lateral ventricle noted. Minimal intraventricular blood products are unchanged. Subarachnoid blood along the right temporal lobe is also stable. No new hemorrhage is present. One 2 mm midline shift is stable. No significant extra-axial fluid is present on the left hemisphere. The brainstem and cerebellum are within normal limits.  Vascular: Atherosclerotic calcifications are present within the cavernous internal carotid arteries bilaterally. No hyperdense vessel is present. Skull: Calvarium is intact. No focal lytic or blastic lesions are present. No significant extracranial soft tissue lesion is present. Sinuses/Orbits: Fluid is present inferior right mastoid air cells. Scratched at fluid is present the inferior mastoid air cells bilaterally. This is increased slightly. Obstructing lesions are present. The paranasal sinuses and mastoid air cells are otherwise clear. The globes and orbits are within normal limits. IMPRESSION: 1. Stable appearance of right parietal and temporal lobe hemorrhage with surrounding edema and mass effect. 2. Stable subarachnoid blood along the right temporal  lobe. 3. Stable minimal intraventricular blood products. 4. No new hemorrhage. 5. Slight increase in bilateral mastoid effusions. Electronically Signed   By: San Morelle M.D.   On: 07/22/2021 15:01   CT HEAD WO CONTRAST (5MM)  Result Date: 07/21/2021 CLINICAL DATA:  Tension-type headache EXAM: CT HEAD WITHOUT CONTRAST TECHNIQUE: Contiguous axial images were obtained from the base of the skull through the vertex without intravenous contrast. RADIATION DOSE REDUCTION: This exam was performed according to the departmental dose-optimization program which includes automated exposure control, adjustment of the mA and/or kV according to patient size and/or use of iterative reconstruction technique. COMPARISON:  None FINDINGS: Brain: Generalized atrophy. Mild effacement of the occipital horn and atrium of the RIGHT lateral ventricle. Remaining ventricular system normal appearance. Large area of acute intraparenchymal hemorrhage identified within the RIGHT temporoparietal lobe 4.0 x 4.0 x 4.1 cm with surrounding vasogenic edema. Additionally, small amount of subarachnoid hemorrhage is seen within sulci at the anterior RIGHT temporal lobe. No intraventricular  extension of hemorrhage. 3 mm of RIGHT to LEFT midline shift. Underlying small vessel chronic ischemic changes of deep cerebral white matter. No focal mass identified. Vascular: Atherosclerotic calcification of internal carotid arteries at skull base Skull: Intact Sinuses/Orbits: Clear Other: N/A IMPRESSION: Large area of acute intraparenchymal hemorrhage within the RIGHT temporoparietal lobe 4.0 x 4.0 x 4.1 cm with surrounding vasogenic edema and mild effacement of the occipital horn and atrium of the RIGHT lateral ventricle. Small amount of subarachnoid hemorrhage within sulci at the anterior RIGHT temporal lobe. 3 mm of RIGHT to LEFT midline shift. Underlying small vessel chronic ischemic changes of deep cerebral white matter. Critical Value/emergent results were called by telephone at the time of interpretation on 07/21/2021 at 12:56 pm to provider Laurey Arrow MD, who verbally acknowledged these results. Electronically Signed   By: Lavonia Dana M.D.   On: 07/21/2021 12:57   CT CHEST WO CONTRAST  Result Date: 07/20/2021 CLINICAL DATA:  81 year old female with history of community-acquired pneumonia. EXAM: CT CHEST WITHOUT CONTRAST TECHNIQUE: Multidetector CT imaging of the chest was performed following the standard protocol without IV contrast. RADIATION DOSE REDUCTION: This exam was performed according to the departmental dose-optimization program which includes automated exposure control, adjustment of the mA and/or kV according to patient size and/or use of iterative reconstruction technique. COMPARISON:  No priors. FINDINGS: Cardiovascular: Heart size is normal. There is no significant pericardial fluid, thickening or pericardial calcification. Aortic atherosclerosis. No coronary artery calcifications. Mediastinum/Nodes: No pathologically enlarged mediastinal or hilar lymph nodes. Please note that accurate exclusion of hilar adenopathy is limited on noncontrast CT scans. Large hiatal hernia. No axillary  lymphadenopathy. Lungs/Pleura: Study is limited by considerable patient respiratory motion. With these limitations in mind, there are widespread areas of cylindrical, varicose and occasional cystic bronchiectasis, with extensive thickening of the peribronchovascular interstitium, regional areas of architectural distortion, and extensive areas of peribronchovascular micro and macronodularity, most compatible with infected areas of mucoid impaction. No pleural effusions. Upper Abdomen: Aortic atherosclerosis. Musculoskeletal: There are no aggressive appearing lytic or blastic lesions noted in the visualized portions of the skeleton. IMPRESSION: 1. 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 Given the widespread micro and macronodularity, acute superinfection is not excluded. 2. Large hiatal hernia. 3. Aortic atherosclerosis. Aortic Atherosclerosis (ICD10-I70.0). Electronically Signed   By: Vinnie Langton M.D.   On: 07/20/2021 09:11   MR ANGIO HEAD WO CONTRAST  Result Date: 07/21/2021  CLINICAL DATA:  Initial evaluation for altered mental status. EXAM: MRI HEAD WITHOUT CONTRAST MRA HEAD WITHOUT CONTRAST TECHNIQUE: Multiplanar, multi-echo pulse sequences of the brain and surrounding structures were acquired without intravenous contrast. Angiographic images of the Circle of Willis were acquired using MRA technique without intravenous contrast. COMPARISON:  Prior CT from earlier the same day. FINDINGS: MRI HEAD FINDINGS Brain: Generalized age-related cerebral atrophy. Patchy and confluent T2/FLAIR hyperintensity involving the periventricular deep white matter both cerebral hemispheres as well as the pons, consistent with chronic small vessel ischemic disease, moderately advanced in nature. Small remote left cerebellar infarct noted. Previously identified intraparenchymal hematoma centered at the left temporal  occipital region again seen. Hemorrhage measures approximately 3.7 x 3.7 x 3.7 cm (estimated volume 25 mL). No visible underlying mass lesion or other structural abnormality on this noncontrast examination. Associated small volume subarachnoid hemorrhage within the adjacent right temporal region. Surrounding vasogenic edema with partial mass effect on the atrium of the right lateral ventricle and trace 3 mm right-to-left shift. No hydrocephalus or trapping. Basilar cisterns remain patent. Trace intraventricular hemorrhage noted as well, likely related to redistribution. No other evidence for acute intracranial hemorrhage. No acute or subacute infarct elsewhere within the brain. Gray-white matter differentiation otherwise maintained. No other foci of susceptibility artifact to suggest acute or chronic intracranial blood products. No mass lesion or extra-axial fluid collection. Pituitary gland suprasellar region normal. Midline structures intact. Vascular: Major intracranial vascular flow voids are well maintained. Skull and upper cervical spine: Craniocervical junction within normal limits. Bone marrow signal intensity normal. No scalp soft tissue abnormality. Sinuses/Orbits: Globes orbital soft tissues within normal limits. Mild mucosal thickening noted within the ethmoidal air cells. Paranasal sinuses are otherwise clear. Small bilateral mastoid effusions noted. Visualized nasopharynx unremarkable. Other: None. MRA HEAD FINDINGS Anterior circulation: Examination degraded by motion artifact and positioning. Visualized distal cervical segments of the internal carotid arteries are patent with antegrade flow. Petrous segments patent bilaterally. Atheromatous irregularity seen within the carotid siphons without hemodynamically significant stenosis. Irregular 5 mm focal outpouching extending laterally from the cavernous right ICA consistent with a small aneurysm (series 19, image 57). A1 segments patent bilaterally.  Normal anterior communicating artery complex. Both anterior cerebral arteries grossly patent to their distal aspects without significant stenosis. No M1 stenosis or occlusion. Normal MCA bifurcations. Distal MCA branches well perfused and fairly symmetric. Posterior circulation: Visualized distal V4 segments patent without stenosis. Right vertebral artery slightly dominant. Basilar patent to its distal aspect without stenosis. Superior cerebellar arteries patent bilaterally. Both PCAs primarily supplied via the basilar well perfused or distal aspects. Anatomic variants: None significant. No vascular abnormality seen underlying the right cerebral hemorrhage. IMPRESSION: MRI HEAD IMPRESSION: 1. 25 mL intraparenchymal hematoma centered at the right temporoccipital region. No visible underlying mass lesion or other structural abnormality on this noncontrast examination. Associated vasogenic edema with trace 3 mm of right-to-left shift. 2. Associated small volume subarachnoid hemorrhage within the adjacent right cerebral hemisphere. Trace intraventricular hemorrhage likely related to redistribution. No hydrocephalus or trapping. 3. Underlying age-related cerebral atrophy with moderately advanced chronic microvascular ischemic disease. 4. Small remote left cerebellar infarct. MRA HEAD IMPRESSION: 1. No vascular abnormality seen underlying the right cerebral hemorrhage. Negative intracranial MRA 2. For large vessel occlusion. Mild atheromatous irregularity about the carotid siphons without hemodynamically significant or correctable stenosis. 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 Electronically Signed   By: Jeannine Boga M.D.   On: 07/21/2021 23:27   MR BRAIN  WO CONTRAST  Result Date: 07/21/2021 CLINICAL DATA:  Initial evaluation for altered mental status. EXAM: MRI HEAD WITHOUT CONTRAST MRA HEAD WITHOUT CONTRAST TECHNIQUE: Multiplanar, multi-echo pulse sequences of the brain and  surrounding structures were acquired without intravenous contrast. Angiographic images of the Circle of Willis were acquired using MRA technique without intravenous contrast. COMPARISON:  Prior CT from earlier the same day. FINDINGS: MRI HEAD FINDINGS Brain: Generalized age-related cerebral atrophy. Patchy and confluent T2/FLAIR hyperintensity involving the periventricular deep white matter both cerebral hemispheres as well as the pons, consistent with chronic small vessel ischemic disease, moderately advanced in nature. Small remote left cerebellar infarct noted. Previously identified intraparenchymal hematoma centered at the left temporal occipital region again seen. Hemorrhage measures approximately 3.7 x 3.7 x 3.7 cm (estimated volume 25 mL). No visible underlying mass lesion or other structural abnormality on this noncontrast examination. Associated small volume subarachnoid hemorrhage within the adjacent right temporal region. Surrounding vasogenic edema with partial mass effect on the atrium of the right lateral ventricle and trace 3 mm right-to-left shift. No hydrocephalus or trapping. Basilar cisterns remain patent. Trace intraventricular hemorrhage noted as well, likely related to redistribution. No other evidence for acute intracranial hemorrhage. No acute or subacute infarct elsewhere within the brain. Gray-white matter differentiation otherwise maintained. No other foci of susceptibility artifact to suggest acute or chronic intracranial blood products. No mass lesion or extra-axial fluid collection. Pituitary gland suprasellar region normal. Midline structures intact. Vascular: Major intracranial vascular flow voids are well maintained. Skull and upper cervical spine: Craniocervical junction within normal limits. Bone marrow signal intensity normal. No scalp soft tissue abnormality. Sinuses/Orbits: Globes orbital soft tissues within normal limits. Mild mucosal thickening noted within the ethmoidal air  cells. Paranasal sinuses are otherwise clear. Small bilateral mastoid effusions noted. Visualized nasopharynx unremarkable. Other: None. MRA HEAD FINDINGS Anterior circulation: Examination degraded by motion artifact and positioning. Visualized distal cervical segments of the internal carotid arteries are patent with antegrade flow. Petrous segments patent bilaterally. Atheromatous irregularity seen within the carotid siphons without hemodynamically significant stenosis. Irregular 5 mm focal outpouching extending laterally from the cavernous right ICA consistent with a small aneurysm (series 19, image 57). A1 segments patent bilaterally. Normal anterior communicating artery complex. Both anterior cerebral arteries grossly patent to their distal aspects without significant stenosis. No M1 stenosis or occlusion. Normal MCA bifurcations. Distal MCA branches well perfused and fairly symmetric. Posterior circulation: Visualized distal V4 segments patent without stenosis. Right vertebral artery slightly dominant. Basilar patent to its distal aspect without stenosis. Superior cerebellar arteries patent bilaterally. Both PCAs primarily supplied via the basilar well perfused or distal aspects. Anatomic variants: None significant. No vascular abnormality seen underlying the right cerebral hemorrhage. IMPRESSION: MRI HEAD IMPRESSION: 1. 25 mL intraparenchymal hematoma centered at the right temporoccipital region. No visible underlying mass lesion or other structural abnormality on this noncontrast examination. Associated vasogenic edema with trace 3 mm of right-to-left shift. 2. Associated small volume subarachnoid hemorrhage within the adjacent right cerebral hemisphere. Trace intraventricular hemorrhage likely related to redistribution. No hydrocephalus or trapping. 3. Underlying age-related cerebral atrophy with moderately advanced chronic microvascular ischemic disease. 4. Small remote left cerebellar infarct. MRA HEAD  IMPRESSION: 1. No vascular abnormality seen underlying the right cerebral hemorrhage. Negative intracranial MRA 2. For large vessel occlusion. Mild atheromatous irregularity about the carotid siphons without hemodynamically significant or correctable stenosis. 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 Electronically Signed   By: Pincus Badder.D.  On: 07/21/2021 23:27   US RENAL  Result Date: 07/24/2021 CLINICAL DATA:  Acute kidney injury. EXAM: RENAL / URINARY TRACT ULTRASOUND COMPLETE COMPARISON:  CT abdomen and pelvis dated July 20, 2021. FINDINGS: Right Kidney: Renal measurements: 10.2 x 4.4 x 3.7 cm = volume: 86 mL. Increased echogenicity. No mass or hydronephrosis visualized. Multiple cysts measuring up to 1.7 cm. Left Kidney: Renal measurements: 9.7 x 5.2 x 4.3 cm = volume: 113 mL. Increased echogenicity. No mass or hydronephrosis visualized. Multiple cysts measuring up to 1.6 cm. Bladder: Decompressed by Foley catheter. Other: None. IMPRESSION: 1. No acute abnormality. Resolved bilateral hydronephrosis after Foley catheter placement. 2. Increased bilateral renal echogenicity, consistent with medical renal disease. Electronically Signed   By: Titus Dubin M.D.   On: 07/24/2021 15:31   DG Chest Port 1 View  Result Date: 07/19/2021 CLINICAL DATA:  Suspected sepsis.  Altered mental status. EXAM: PORTABLE CHEST 1 VIEW COMPARISON:  None. FINDINGS: The heart is enlarged. There is a retrocardiac hiatal hernia. Patchy airspace opacities throughout the right lung and at the left lung base. There is background interstitial thickening. No significant pleural effusion. No pneumothorax. The bones are under mineralized. No acute osseous findings. IMPRESSION: 1. Patchy airspace opacities throughout the right lung and at the left lung base. Differential considerations include multifocal pneumonia, including atypical organisms, or asymmetric pulmonary edema. 2. Cardiomegaly and hiatal  hernia. Electronically Signed   By: Keith Rake M.D.   On: 07/19/2021 21:52   DG Swallowing Func-Speech Pathology  Result Date: 07/24/2021 Table formatting from the original result was not included. Objective Swallowing Evaluation: Type of Study: MBS-Modified Barium Swallow Study  Patient Details Name: ZYAIRAH WACHA MRN: [id number] Date of Birth: Oct 05, 1940 Today's Date: 07/24/2021 Time: SLP Start Time (ACUTE ONLY): 28 -SLP Stop Time (ACUTE ONLY): 1247 SLP Time Calculation (min) (ACUTE ONLY): 17 min Past Medical History: No past medical history on file. Past Surgical History: No past surgical history on file. HPI: 81 yo F with end-stage renal failure with CKD, cardiac dysfunction with arrhythmia and SVT, hypothyroidism, moderate to severe protein calorie malnutrition, chronic bronchiectasis, She was admitted with altered mental status and confusion. She was found to have leukocytosis with elevated lactate and metabolic acidosis. Chest CT with cylindrical and saccular bronchiectatic changes with chronic bronchitic airways bilaterally with mucoid impaction and bibasilar fibrotic changes and large hiatal hernia. MRI 25 mL intraparenchymal hematoma centered at the right temporoccipital region. associated small volume subarachnoid hemorrhage within the adjacent right cerebral hemisphere, underlying age-related cerebral atrophy with moderately advanced  chronic microvascular ischemic disease, small remote left cerebellar infarct.  Subjective: Pt alert, confusion evident. Yelling for people who are not present in room. Eyes closed intermittently. Cleared with RN. Son present.  Recommendations for follow up therapy are one component of a multi-disciplinary discharge planning process, led by the attending physician.  Recommendations may be updated based on patient status, additional functional criteria and insurance authorization. Assessment / Plan / Recommendation Clinical Impressions 07/24/2021 Clinical Impression Pt was  seen in radiology suite for modified barium swallow study. Trials of puree solids, regular texture solids, a 59mm barium tablet, and thin liquids via cup and straw were administered. Pt presented with mild pharyngoesophageal dysphagia characterized by reduction in pharyngeal constriction, and PES distension. A CP bar was intermittently noted and residue was noted at the level of the PES. Solid residue was cleared with a liquid wash and liquid residue was reduced with secondary swallows. It is recommended that the pt's current  diet of regular solids and thin liquids be continued at this time. SLP will see patient once more for education; however, it is anticipated that further skilled SLP services for swallowing will likely not be needed beyond that. SLP Visit Diagnosis Dysphagia, pharyngoesophageal phase (R13.14) Attention and concentration deficit following -- Frontal lobe and executive function deficit following -- Impact on safety and function Mild aspiration risk   Treatment Recommendations 07/24/2021 Treatment Recommendations Therapy as outlined in treatment plan below   Prognosis 07/24/2021 Prognosis for Safe Diet Advancement Good Barriers to Reach Goals Cognitive deficits Barriers/Prognosis Comment -- Diet Recommendations 07/24/2021 SLP Diet Recommendations Regular solids;Thin liquid Liquid Administration via Cup;Straw Medication Administration Whole meds with liquid Compensations Small sips/bites;Slow rate Postural Changes Seated upright at 90 degrees   Other Recommendations 07/24/2021 Recommended Consults -- Oral Care Recommendations Oral care BID Other Recommendations -- Follow Up Recommendations Acute inpatient rehab (3hours/day) Assistance recommended at discharge Frequent or constant Supervision/Assistance Functional Status Assessment Patient has had a recent decline in their functional status and demonstrates the ability to make significant improvements in function in a reasonable and predictable amount of  time. Frequency and Duration  07/24/2021 Speech Therapy Frequency (ACUTE ONLY) min 1 x/week Treatment Duration 1 week   Oral Phase 07/24/2021 Oral Phase WFL Oral - Pudding Teaspoon -- Oral - Pudding Cup -- Oral - Honey Teaspoon -- Oral - Honey Cup -- Oral - Nectar Teaspoon -- Oral - Nectar Cup -- Oral - Nectar Straw -- Oral - Thin Teaspoon -- Oral - Thin Cup -- Oral - Thin Straw -- Oral - Puree -- Oral - Mech Soft -- Oral - Regular -- Oral - Multi-Consistency -- Oral - Pill -- Oral Phase - Comment --  Pharyngeal Phase 07/24/2021 Pharyngeal Phase WFL Pharyngeal- Pudding Teaspoon -- Pharyngeal -- Pharyngeal- Pudding Cup -- Pharyngeal -- Pharyngeal- Honey Teaspoon -- Pharyngeal -- Pharyngeal- Honey Cup -- Pharyngeal -- Pharyngeal- Nectar Teaspoon -- Pharyngeal -- Pharyngeal- Nectar Cup -- Pharyngeal -- Pharyngeal- Nectar Straw -- Pharyngeal -- Pharyngeal- Thin Teaspoon -- Pharyngeal -- Pharyngeal- Thin Cup Pharyngeal residue - cp segment;Reduced pharyngeal peristalsis Pharyngeal -- Pharyngeal- Thin Straw Pharyngeal residue - cp segment;Reduced pharyngeal peristalsis Pharyngeal -- Pharyngeal- Puree Pharyngeal residue - cp segment;Reduced pharyngeal peristalsis Pharyngeal -- Pharyngeal- Mechanical Soft -- Pharyngeal -- Pharyngeal- Regular -- Pharyngeal -- Pharyngeal- Multi-consistency -- Pharyngeal -- Pharyngeal- Pill -- Pharyngeal -- Pharyngeal Comment --  Cervical Esophageal Phase  07/24/2021 Cervical Esophageal Phase Impaired Pudding Teaspoon -- Pudding Cup -- Honey Teaspoon -- Honey Cup -- Nectar Teaspoon -- Nectar Cup -- Nectar Straw -- Thin Teaspoon -- Thin Cup Prominent cricopharyngeal segment;Reduced cricopharyngeal relaxation Thin Straw Prominent cricopharyngeal segment;Reduced cricopharyngeal relaxation Puree -- Mechanical Soft -- Regular -- Multi-consistency -- Pill -- Cervical Esophageal Comment -- Tobie Poet I. Hardin Negus, [place], [place] Office number [phone] Pager [place] 07/24/2021, 2:07 PM                     ECHOCARDIOGRAM COMPLETE  Result Date: 07/22/2021    ECHOCARDIOGRAM REPORT   Patient Name:   Lauren Conner Date of Exam: 07/22/2021 Medical Rec #:  [id number]   Height:       63.0 in Accession #:    [id number]  Weight:       99.9 lb Date of Birth:  1941/02/11   BSA:          1.439 m Patient Age:    63 years    BP:  121/63 mmHg Patient Gender: F           HR:           97 bpm. Exam Location:  Inpatient Procedure: 2D Echo, Cardiac Doppler and Color Doppler Indications:    Stroke  History:        Patient has no prior history of Echocardiogram examinations.                 Risk Factors:Hypertension.  Sonographer:    Jyl Heinz Referring Phys: [id number] [place]  1. Left ventricular ejection fraction, by estimation, is 65 to 70%. Left ventricular ejection fraction by 2D MOD biplane is 65.2 %. The left ventricle has normal function. The left ventricle has no regional wall motion abnormalities. Left ventricular diastolic parameters are consistent with Grade I diastolic dysfunction (impaired relaxation).  2. Right ventricular systolic function is low normal. The right ventricular size is normal. There is severely elevated pulmonary artery systolic pressure. The estimated right ventricular systolic pressure is 23.7 mmHg.  3. The mitral valve is grossly normal. Trivial mitral valve regurgitation.  4. The aortic valve is tricuspid. Aortic valve regurgitation is trivial. Aortic valve sclerosis is present, with no evidence of aortic valve stenosis. Aortic regurgitation PHT measures 509 msec.  5. The inferior vena cava is dilated in size with >50% respiratory variability, suggesting right atrial pressure of 8 mmHg. Comparison(s): No prior Echocardiogram. FINDINGS  Left Ventricle: Left ventricular ejection fraction, by estimation, is 65 to 70%. Left ventricular ejection fraction by 2D MOD biplane is 65.2 %. The left ventricle has normal function. The left  ventricle has no regional wall motion abnormalities. The left ventricular internal cavity size was normal in size. There is no left ventricular hypertrophy. Left ventricular diastolic parameters are consistent with Grade I diastolic dysfunction (impaired relaxation). Indeterminate filling pressures. Right Ventricle: The right ventricular size is normal. No increase in right ventricular wall thickness. Right ventricular systolic function is low normal. There is severely elevated pulmonary artery systolic pressure. The tricuspid regurgitant velocity is 3.91 m/s, and with an assumed right atrial pressure of 8 mmHg, the estimated right ventricular systolic pressure is 62.8 mmHg. Left Atrium: Left atrial size was normal in size. Right Atrium: Right atrial size was normal in size. Pericardium: There is no evidence of pericardial effusion. Mitral Valve: The mitral valve is grossly normal. Trivial mitral valve regurgitation. Tricuspid Valve: The tricuspid valve is grossly normal. Tricuspid valve regurgitation is trivial. Aortic Valve: The aortic valve is tricuspid. Aortic valve regurgitation is trivial. Aortic regurgitation PHT measures 509 msec. Aortic valve sclerosis is present, with no evidence of aortic valve stenosis. Aortic valve peak gradient measures 7.6 mmHg. Pulmonic Valve: The pulmonic valve was grossly normal. Pulmonic valve regurgitation is not visualized. Aorta: The aortic root and ascending aorta are structurally normal, with no evidence of dilitation. Venous: The inferior vena cava is dilated in size with greater than 50% respiratory variability, suggesting right atrial pressure of 8 mmHg. IAS/Shunts: No atrial level shunt detected by color flow Doppler.  LEFT VENTRICLE PLAX 2D                        Biplane EF (MOD) LVIDd:         4.00 cm         LV Biplane EF:   Left LVIDs:         2.50 cm  ventricular LV PW:         1.00 cm                          ejection LV IVS:        1.00 cm                           fraction by LVOT diam:     2.00 cm                          2D MOD LV SV:         67                               biplane is LV SV Index:   47                               65.2 %. LVOT Area:     3.14 cm                                Diastology                                LV e' medial:    5.22 cm/s LV Volumes (MOD)               LV E/e' medial:  12.0 LV vol d, MOD    59.4 ml       LV e' lateral:   6.64 cm/s A2C:                           LV E/e' lateral: 9.4 LV vol d, MOD    60.8 ml A4C: LV vol s, MOD    20.3 ml A2C: LV vol s, MOD    20.7 ml A4C: LV SV MOD A2C:   39.1 ml LV SV MOD A4C:   60.8 ml LV SV MOD BP:    39.4 ml RIGHT VENTRICLE             IVC RV Basal diam:  3.30 cm     IVC diam: 2.00 cm RV Mid diam:    3.10 cm RV S prime:     10.40 cm/s TAPSE (M-mode): 2.3 cm LEFT ATRIUM             Index        RIGHT ATRIUM           Index LA diam:        3.80 cm 2.64 cm/m   RA Area:     17.20 cm LA Vol (A2C):   27.3 ml 18.97 ml/m  RA Volume:   45.10 ml  31.33 ml/m LA Vol (A4C):   44.1 ml 30.64 ml/m LA Biplane Vol: 36.8 ml 25.57 ml/m  AORTIC VALVE AV Area (Vmax): 2.62 cm AV Vmax:        138.00 cm/s AV Peak Grad:   7.6 mmHg LVOT Vmax:      115.00 cm/s LVOT Vmean:     79.400 cm/s LVOT VTI:       0.214 m AI  PHT:         509 msec  AORTA Ao Root diam: 3.20 cm Ao Asc diam:  3.00 cm MITRAL VALVE               TRICUSPID VALVE MV Area (PHT): 3.68 cm    TR Peak grad:   61.2 mmHg MV Decel Time: 206 msec    TR Vmax:        391.00 cm/s MV E velocity: 62.60 cm/s MV A velocity: 83.10 cm/s  SHUNTS MV E/A ratio:  0.75        Systemic VTI:  0.21 m                            Systemic Diam: 2.00 cm Lyman Bishop MD Electronically signed by Lyman Bishop MD Signature Date/Time: 07/22/2021/10:47:58 AM    Final     Lab Data:  CBC: Recent Labs  Lab 07/21/21 0414 07/22/21 0424 07/23/21 0119 07/24/21 0043 07/25/21 0553  WBC 11.0* 8.4 9.3 6.5 6.2  HGB 7.6* 7.9* 9.0* 7.1* 6.9*  HCT 23.1* 24.0* 27.8*  22.2* 20.8*  MCV 92.0 94.9 96.2 96.9 95.9  PLT 238 193 241 203 976   Basic Metabolic Panel: Recent Labs  Lab 07/21/21 0414 07/21/21 1746 07/21/21 2337 07/22/21 0424 07/23/21 0119 07/24/21 0043 07/25/21 0553  NA 133*   < > 132* 132* 133* 134* 136  K 4.1  --   --  4.3 4.0 3.8 3.9  CL 105  --   --  105 105 108 112*  CO2 16*  --   --  18* 18* 18* 18*  GLUCOSE 90  --   --  51* 95 109* 80  BUN 47*  --   --  46* 42* 40* 35*  CREATININE 2.39*  --   --  2.54* 2.59* 2.78* 2.44*  CALCIUM 8.1*  --   --  7.9* 8.2* 7.5* 7.5*  MG  --   --   --  1.7  --   --  1.9  PHOS  --   --   --  4.0  --   --   --    < > = values in this interval not displayed.   GFR: Estimated Creatinine Clearance: 13.4 mL/min (A) (by C-G formula based on SCr of 2.44 mg/dL (H)). Liver Function Tests: Recent Labs  Lab 07/19/21 2200 07/21/21 0414  AST 35 20  ALT 15 12  ALKPHOS 68 53  BILITOT 0.7 0.6  PROT 7.9 6.0*  ALBUMIN 3.1* 2.4*   No results for input(s): LIPASE, AMYLASE in the last 168 hours. No results for input(s): AMMONIA in the last 168 hours. Coagulation Profile: Recent Labs  Lab 07/20/21 0016  INR 1.2   Cardiac Enzymes: No results for input(s): CKTOTAL, CKMB, CKMBINDEX, TROPONINI in the last 168 hours. BNP (last 3 results) No results for input(s): PROBNP in the last 8760 hours. HbA1C: Recent Labs    07/23/21 0119  HGBA1C 5.1   CBG: Recent Labs  Lab 07/22/21 0746 07/22/21 0836 07/22/21 1146 07/23/21 0118  GLUCAP 44* 111* 77 96   Lipid Profile: Recent Labs    07/23/21 0119  CHOL 157  HDL 45  LDLCALC 94  TRIG 90  CHOLHDL 3.5   Thyroid Function Tests: No results for input(s): TSH, T4TOTAL, FREET4, T3FREE, THYROIDAB in the last 72 hours.  Anemia Panel: Recent Labs    07/25/21 0553  FERRITIN 45  TIBC 235*  IRON 35   Urine analysis:    Component Value Date/Time   COLORURINE STRAW (A) 07/24/2021 1346   APPEARANCEUR CLEAR 07/24/2021 1346   LABSPEC 1.005 07/24/2021 1346    PHURINE 5.0 07/24/2021 1346   GLUCOSEU NEGATIVE 07/24/2021 1346   HGBUR SMALL (A) 07/24/2021 1346   BILIRUBINUR NEGATIVE 07/24/2021 1346   KETONESUR NEGATIVE 07/24/2021 1346   PROTEINUR 100 (A) 07/24/2021 1346   NITRITE NEGATIVE 07/24/2021 1346   LEUKOCYTESUR NEGATIVE 07/24/2021 1346     Ansel Ferrall M.D. [facility] 07/25/2021, 2:10 PM  Available via Epic secure chat 7am-7pm After 7 pm, please refer to night coverage provider listed on amion.

## 2021-07-25 NOTE — Progress Notes (Signed)
[facility] ROUNDING NOTE   Subjective:   Interval History: 81 year old female history of hypertension bronchiectasis hypothyroidism CKD of unknown etiology followed at [facility].  Baseline serum creatinine about 2 mg/dL.  Was admitted with hypotension and acute kidney injury.  Renal ultrasound no evidence of hydronephrosis bilateral cysts.  Foley catheter in place.  Urine output 1000 cc 07/24/2021  Blood pressure 140/72 pulse 95 temperature 98.1 O2 sats 96% room air  Sodium 136 potassium 3.9 chloride 112 CO2 18 BUN 35 creatinine 2.44 glucose 80 calcium 7.5 magnesium 1.9 hemoglobin 6.9   Objective:  Vital signs in last 24 hours:  Temp:  [97.5 F (36.4 C)-98.1 F (36.7 C)] 98.1 F (36.7 C) (03/10 0344) Pulse Rate:  [87-102] 100 (03/10 0344) Resp:  [15-17] 15 (03/10 0344) BP: (130-140)/(66-81) 140/72 (03/10 0344) SpO2:  [96 %-100 %] 96 % (03/10 0736)  Weight change:  There were no vitals filed for this visit.  Intake/Output: I/O last 3 completed shifts: In: 2681.1 [P.O.:580; I.V.:2101.1] Out: 1000 [Urine:1000]   Intake/Output this shift:  No intake/output data recorded.  CVS- RRR no murmurs rubs or gallops RS- CTA no wheezes or rales ABD- BS present soft non-distended EXT- no edema   Basic Metabolic Panel: Recent Labs  Lab 07/21/21 0414 07/21/21 1746 07/21/21 2337 07/22/21 0424 07/23/21 0119 07/24/21 0043 07/25/21 0553  NA 133*   < > 132* 132* 133* 134* 136  K 4.1  --   --  4.3 4.0 3.8 3.9  CL 105  --   --  105 105 108 112*  CO2 16*  --   --  18* 18* 18* 18*  GLUCOSE 90  --   --  51* 95 109* 80  BUN 47*  --   --  46* 42* 40* 35*  CREATININE 2.39*  --   --  2.54* 2.59* 2.78* 2.44*  CALCIUM 8.1*  --   --  7.9* 8.2* 7.5* 7.5*  MG  --   --   --  1.7  --   --  1.9  PHOS  --   --   --  4.0  --   --   --    < > = values in this interval not displayed.    Liver Function Tests: Recent Labs  Lab 07/19/21 2200 07/21/21 0414  AST 35 20  ALT 15 12   ALKPHOS 68 53  BILITOT 0.7 0.6  PROT 7.9 6.0*  ALBUMIN 3.1* 2.4*   No results for input(s): LIPASE, AMYLASE in the last 168 hours. No results for input(s): AMMONIA in the last 168 hours.  CBC: Recent Labs  Lab 07/21/21 0414 07/22/21 0424 07/23/21 0119 07/24/21 0043 07/25/21 0553  WBC 11.0* 8.4 9.3 6.5 6.2  HGB 7.6* 7.9* 9.0* 7.1* 6.9*  HCT 23.1* 24.0* 27.8* 22.2* 20.8*  MCV 92.0 94.9 96.2 96.9 95.9  PLT 238 193 241 203 203    Cardiac Enzymes: No results for input(s): CKTOTAL, CKMB, CKMBINDEX, TROPONINI in the last 168 hours.  BNP: Invalid input(s): POCBNP  CBG: Recent Labs  Lab 07/22/21 0746 07/22/21 0836 07/22/21 1146 07/23/21 0118  GLUCAP 44* 111* 23 96    Microbiology: Results for orders placed or performed during the hospital encounter of 07/21/21  MRSA Next Gen by PCR, Nasal     Status: None   Collection Time: 07/21/21  5:44 PM   Specimen: Nasal Mucosa; Nasal Swab  Result Value Ref Range Status   MRSA by PCR Next Gen NOT DETECTED NOT  DETECTED Final    Comment: (NOTE) The GeneXpert MRSA Assay (FDA approved for NASAL specimens only), is one component of a comprehensive MRSA colonization surveillance program. It is not intended to diagnose MRSA infection nor to guide or monitor treatment for MRSA infections. Test performance is not FDA approved in patients less than 75 years old. Performed at [facility], [place] [address], [place], [place] [address]     Coagulation Studies: No results for input(s): LABPROT, INR in the last 72 hours.  Urinalysis: Recent Labs    07/24/21 1346  COLORURINE STRAW*  LABSPEC 1.005  PHURINE 5.0  GLUCOSEU NEGATIVE  HGBUR SMALL*  BILIRUBINUR NEGATIVE  KETONESUR NEGATIVE  PROTEINUR 100*  NITRITE NEGATIVE  LEUKOCYTESUR NEGATIVE      Imaging: US RENAL  Result Date: 07/24/2021 CLINICAL DATA:  Acute kidney injury. EXAM: RENAL / URINARY TRACT ULTRASOUND COMPLETE COMPARISON:  CT abdomen and pelvis dated July 20, 2021. FINDINGS: Right Kidney: Renal measurements: 10.2 x 4.4 x 3.7 cm = volume: 86 mL. Increased echogenicity. No mass or hydronephrosis visualized. Multiple cysts measuring up to 1.7 cm. Left Kidney: Renal measurements: 9.7 x 5.2 x 4.3 cm = volume: 113 mL. Increased echogenicity. No mass or hydronephrosis visualized. Multiple cysts measuring up to 1.6 cm. Bladder: Decompressed by Foley catheter. Other: None. IMPRESSION: 1. No acute abnormality. Resolved bilateral hydronephrosis after Foley catheter placement. 2. Increased bilateral renal echogenicity, consistent with medical renal disease. Electronically Signed   By: Titus Dubin M.D.   On: 07/24/2021 15:31   DG Swallowing Func-Speech Pathology  Result Date: 07/24/2021 Table formatting from the original result was not included. Objective Swallowing Evaluation: Type of Study: MBS-Modified Barium Swallow Study  Patient Details Name: Lauren Conner MRN: [id number] Date of Birth: May 02, 1941 Today's Date: 07/24/2021 Time: SLP Start Time (ACUTE ONLY): 84 -SLP Stop Time (ACUTE ONLY): 1247 SLP Time Calculation (min) (ACUTE ONLY): 17 min Past Medical History: No past medical history on file. Past Surgical History: No past surgical history on file. HPI: 81 yo F with end-stage renal failure with CKD, cardiac dysfunction with arrhythmia and SVT, hypothyroidism, moderate to severe protein calorie malnutrition, chronic bronchiectasis, She was admitted with altered mental status and confusion. She was found to have leukocytosis with elevated lactate and metabolic acidosis. Chest CT with cylindrical and saccular bronchiectatic changes with chronic bronchitic airways bilaterally with mucoid impaction and bibasilar fibrotic changes and large hiatal hernia. MRI 25 mL intraparenchymal hematoma centered at the right temporoccipital region. associated small volume subarachnoid hemorrhage within the adjacent right cerebral hemisphere, underlying age-related cerebral atrophy with  moderately advanced  chronic microvascular ischemic disease, small remote left cerebellar infarct.  Subjective: Pt alert, confusion evident. Yelling for people who are not present in room. Eyes closed intermittently. Cleared with RN. Son present.  Recommendations for follow up therapy are one component of a multi-disciplinary discharge planning process, led by the attending physician.  Recommendations may be updated based on patient status, additional functional criteria and insurance authorization. Assessment / Plan / Recommendation Clinical Impressions 07/24/2021 Clinical Impression Pt was seen in radiology suite for modified barium swallow study. Trials of puree solids, regular texture solids, a 6mm barium tablet, and thin liquids via cup and straw were administered. Pt presented with mild pharyngoesophageal dysphagia characterized by reduction in pharyngeal constriction, and PES distension. A CP bar was intermittently noted and residue was noted at the level of the PES. Solid residue was cleared with a liquid wash and liquid residue was reduced with  secondary swallows. It is recommended that the pt's current diet of regular solids and thin liquids be continued at this time. SLP will see patient once more for education; however, it is anticipated that further skilled SLP services for swallowing will likely not be needed beyond that. SLP Visit Diagnosis Dysphagia, pharyngoesophageal phase (R13.14) Attention and concentration deficit following -- Frontal lobe and executive function deficit following -- Impact on safety and function Mild aspiration risk   Treatment Recommendations 07/24/2021 Treatment Recommendations Therapy as outlined in treatment plan below   Prognosis 07/24/2021 Prognosis for Safe Diet Advancement Good Barriers to Reach Goals Cognitive deficits Barriers/Prognosis Comment -- Diet Recommendations 07/24/2021 SLP Diet Recommendations Regular solids;Thin liquid Liquid Administration via Cup;Straw Medication  Administration Whole meds with liquid Compensations Small sips/bites;Slow rate Postural Changes Seated upright at 90 degrees   Other Recommendations 07/24/2021 Recommended Consults -- Oral Care Recommendations Oral care BID Other Recommendations -- Follow Up Recommendations Acute inpatient rehab (3hours/day) Assistance recommended at discharge Frequent or constant Supervision/Assistance Functional Status Assessment Patient has had a recent decline in their functional status and demonstrates the ability to make significant improvements in function in a reasonable and predictable amount of time. Frequency and Duration  07/24/2021 Speech Therapy Frequency (ACUTE ONLY) min 1 x/week Treatment Duration 1 week   Oral Phase 07/24/2021 Oral Phase WFL Oral - Pudding Teaspoon -- Oral - Pudding Cup -- Oral - Honey Teaspoon -- Oral - Honey Cup -- Oral - Nectar Teaspoon -- Oral - Nectar Cup -- Oral - Nectar Straw -- Oral - Thin Teaspoon -- Oral - Thin Cup -- Oral - Thin Straw -- Oral - Puree -- Oral - Mech Soft -- Oral - Regular -- Oral - Multi-Consistency -- Oral - Pill -- Oral Phase - Comment --  Pharyngeal Phase 07/24/2021 Pharyngeal Phase WFL Pharyngeal- Pudding Teaspoon -- Pharyngeal -- Pharyngeal- Pudding Cup -- Pharyngeal -- Pharyngeal- Honey Teaspoon -- Pharyngeal -- Pharyngeal- Honey Cup -- Pharyngeal -- Pharyngeal- Nectar Teaspoon -- Pharyngeal -- Pharyngeal- Nectar Cup -- Pharyngeal -- Pharyngeal- Nectar Straw -- Pharyngeal -- Pharyngeal- Thin Teaspoon -- Pharyngeal -- Pharyngeal- Thin Cup Pharyngeal residue - cp segment;Reduced pharyngeal peristalsis Pharyngeal -- Pharyngeal- Thin Straw Pharyngeal residue - cp segment;Reduced pharyngeal peristalsis Pharyngeal -- Pharyngeal- Puree Pharyngeal residue - cp segment;Reduced pharyngeal peristalsis Pharyngeal -- Pharyngeal- Mechanical Soft -- Pharyngeal -- Pharyngeal- Regular -- Pharyngeal -- Pharyngeal- Multi-consistency -- Pharyngeal -- Pharyngeal- Pill -- Pharyngeal -- Pharyngeal  Comment --  Cervical Esophageal Phase  07/24/2021 Cervical Esophageal Phase Impaired Pudding Teaspoon -- Pudding Cup -- Honey Teaspoon -- Honey Cup -- Nectar Teaspoon -- Nectar Cup -- Nectar Straw -- Thin Teaspoon -- Thin Cup Prominent cricopharyngeal segment;Reduced cricopharyngeal relaxation Thin Straw Prominent cricopharyngeal segment;Reduced cricopharyngeal relaxation Puree -- Mechanical Soft -- Regular -- Multi-consistency -- Pill -- Cervical Esophageal Comment -- Tobie Poet I. Hardin Negus, [place], [place] Office number [phone] Pager [phone] Horton Marshall 07/24/2021, 2:07 PM                       Medications:    sodium chloride 100 mL/hr at 07/25/21 0315    sodium chloride   Intravenous Once   arformoterol  15 mcg Nebulization BID   Chlorhexidine Gluconate Cloth  6 each Topical Daily   levofloxacin  500 mg Oral Q48H   levothyroxine  75 mcg Oral Q0600   pantoprazole  40 mg Oral Daily   revefenacin  175 mcg Nebulization Daily   senna-docusate  1 tablet Oral  BID   sodium bicarbonate  650 mg Oral TID   tamsulosin  0.4 mg Oral Daily   acetaminophen **OR** acetaminophen (TYLENOL) oral liquid 160 mg/5 mL **OR** acetaminophen, ondansetron (ZOFRAN) IV, sodium chloride  Assessment/ Plan:  Assessment/Plan: 81 year old WF with bronchiectasis and stage 4 CKD at baseline-  crt 2.0-  now with A on CRF in the setting of hosp for PNA/ICH/sepsis 1.Renal- pt with longstanding history of CKD-  followed by [facility] nephrology-  baseline crt around 2.  Has experienced over the last 4 days some worsening of renal function, nonoliguric and not uremic.  What I suspect is that the BP variability-  going from very high to low normal is our issue here.  I agree with d/c of amlodipine and metoprolol.   Renal ultrasound unremarkable but multiple cysts in both kidneys.  Urine microscopy bland.  Renal function appears to have stabilized. 2. Hypertension/volume  - seems euvolemic-  BP was soft,  now BP meds have been stopped  3. Anemia  - hgb dropped significantly from 3/8 to 3/9 ?  Iron saturations 15%.  Last hemoglobin 6.9.  Would recommend blood transfusion.  Work-up for blood loss anemia    LOS: 4 Sherril Croon @TODAY @8 :33 AM

## 2021-07-25 NOTE — Progress Notes (Signed)
Lab called for critical result of hemoglobin 6.9 . No active bleeding noted. Patient states she feels discomfort or tightness on her lower abdomen below the umbilicus but no pain on it. She also said she is having stuffy nose, noticed her voice change like something there is a drainage. Messaged Dr. Cyd Silence via Shea Evans. ?

## 2021-07-25 NOTE — Progress Notes (Signed)
HOSPITAL MEDICINE OVERNIGHT EVENT NOTE   ? ?Notified by nursing that patient's hemoglobin this morning 6.9, down from 7.1 yesterday. ? ?Nursing reports no evidence of gross bleeding.  Patient denies any chest pain or shortness of breath.  Patient is hemodynamically stable. ? ?Significant anemia likely secondary to chronic kidney disease. ? ?We will order a type and screen followed by a 1 unit packed red blood cell transfusion.  Continue to monitor for any evidence of bleeding. ? ?Vernelle Emerald  MD ?[facility]  ? ? ? ? ? ? ? ? ? ? ?

## 2021-07-26 ENCOUNTER — Encounter (HOSPITAL_COMMUNITY): Payer: Self-pay | Admitting: Physical Medicine & Rehabilitation

## 2021-07-26 ENCOUNTER — Inpatient Hospital Stay (HOSPITAL_COMMUNITY)
Admission: RE | Admit: 2021-07-26 | Discharge: 2021-08-05 | DRG: 056 | Disposition: A | Discharge status: Home / Self Care | Payer: Medicare Other | Source: Intra-hospital | Attending: Physical Medicine & Rehabilitation | Admitting: Physical Medicine & Rehabilitation

## 2021-07-26 ENCOUNTER — Other Ambulatory Visit: Payer: Self-pay

## 2021-07-26 DIAGNOSIS — N184 Chronic kidney disease, stage 4 (severe): Secondary | ICD-10-CM | POA: Diagnosis present

## 2021-07-26 DIAGNOSIS — R339 Retention of urine, unspecified: Secondary | ICD-10-CM | POA: Diagnosis present

## 2021-07-26 DIAGNOSIS — I619 Nontraumatic intracerebral hemorrhage, unspecified: Secondary | ICD-10-CM | POA: Diagnosis not present

## 2021-07-26 DIAGNOSIS — E039 Hypothyroidism, unspecified: Secondary | ICD-10-CM | POA: Diagnosis present

## 2021-07-26 DIAGNOSIS — I129 Hypertensive chronic kidney disease with stage 1 through stage 4 chronic kidney disease, or unspecified chronic kidney disease: Secondary | ICD-10-CM | POA: Diagnosis present

## 2021-07-26 DIAGNOSIS — J189 Pneumonia, unspecified organism: Secondary | ICD-10-CM | POA: Diagnosis present

## 2021-07-26 DIAGNOSIS — R519 Headache, unspecified: Secondary | ICD-10-CM | POA: Diagnosis present

## 2021-07-26 DIAGNOSIS — K5901 Slow transit constipation: Secondary | ICD-10-CM | POA: Diagnosis present

## 2021-07-26 DIAGNOSIS — J479 Bronchiectasis, uncomplicated: Secondary | ICD-10-CM | POA: Diagnosis present

## 2021-07-26 DIAGNOSIS — D631 Anemia in chronic kidney disease: Secondary | ICD-10-CM | POA: Diagnosis present

## 2021-07-26 DIAGNOSIS — I951 Orthostatic hypotension: Secondary | ICD-10-CM | POA: Diagnosis not present

## 2021-07-26 DIAGNOSIS — Z79899 Other long term (current) drug therapy: Secondary | ICD-10-CM | POA: Diagnosis not present

## 2021-07-26 DIAGNOSIS — I69054 Hemiplegia and hemiparesis following nontraumatic subarachnoid hemorrhage affecting left non-dominant side: Secondary | ICD-10-CM | POA: Diagnosis present

## 2021-07-26 DIAGNOSIS — D638 Anemia in other chronic diseases classified elsewhere: Secondary | ICD-10-CM | POA: Diagnosis not present

## 2021-07-26 DIAGNOSIS — I10 Essential (primary) hypertension: Secondary | ICD-10-CM | POA: Diagnosis not present

## 2021-07-26 LAB — BPAM RBC
Blood Product Expiration Date: 202304052359
Blood Product Expiration Date: 202304052359
ISSUE DATE / TIME: 202303101156
ISSUE DATE / TIME: 202303101704
Unit Type and Rh: 5100
Unit Type and Rh: 5100

## 2021-07-26 LAB — BASIC METABOLIC PANEL
Anion gap: 7 (ref 5–15)
BUN: 32 mg/dL — ABNORMAL HIGH (ref 8–23)
CO2: 19 mmol/L — ABNORMAL LOW (ref 22–32)
Calcium: 8.1 mg/dL — ABNORMAL LOW (ref 8.9–10.3)
Chloride: 110 mmol/L (ref 98–111)
Creatinine, Ser: 2.47 mg/dL — ABNORMAL HIGH (ref 0.44–1.00)
GFR, Estimated: 19 mL/min — ABNORMAL LOW (ref >60)
Glucose, Bld: 82 mg/dL (ref 70–99)
Potassium: 4.2 mmol/L (ref 3.5–5.1)
Sodium: 136 mmol/L (ref 135–145)

## 2021-07-26 LAB — CBC
HCT: 31.8 % — ABNORMAL LOW (ref 36.0–46.0)
Hemoglobin: 10.9 g/dL — ABNORMAL LOW (ref 12.0–15.0)
MCH: 31.1 pg (ref 26.0–34.0)
MCHC: 34.3 g/dL (ref 30.0–36.0)
MCV: 90.6 fL (ref 80.0–100.0)
Platelets: 226 10*3/uL (ref 150–400)
RBC: 3.51 MIL/uL — ABNORMAL LOW (ref 3.87–5.11)
RDW: 15.5 % (ref 11.5–15.5)
WBC: 7.5 10*3/uL (ref 4.0–10.5)
nRBC: 0 % (ref 0.0–0.2)

## 2021-07-26 LAB — TYPE AND SCREEN
ABO/RH(D): O POS
Antibody Screen: NEGATIVE
Unit division: 0
Unit division: 0

## 2021-07-26 MED ORDER — ACETAMINOPHEN 160 MG/5ML PO SOLN
650.0000 mg | ORAL | Status: DC | PRN
Start: 2021-07-26 — End: 2021-07-28

## 2021-07-26 MED ORDER — TAMSULOSIN HCL 0.4 MG PO CAPS: 0.4000 mg | ORAL_CAPSULE | Freq: Every day | ORAL | Status: DC

## 2021-07-26 MED ORDER — LEVOFLOXACIN 500 MG PO TABS
500.0000 mg | ORAL_TABLET | ORAL | Status: AC
  Administered 2021-07-27 – 2021-08-02 (×4): 500 mg via ORAL
  Filled 2021-07-26 (×5): qty 1

## 2021-07-26 MED ORDER — SODIUM BICARBONATE 650 MG PO TABS
650.0000 mg | ORAL_TABLET | Freq: Three times a day (TID) | ORAL | Status: DC
  Administered 2021-07-26 – 2021-08-05 (×30): 650 mg via ORAL
  Filled 2021-07-26 (×34): qty 1

## 2021-07-26 MED ORDER — LEVOTHYROXINE SODIUM 75 MCG PO TABS
75.0000 ug | ORAL_TABLET | Freq: Every day | ORAL | Status: DC
  Administered 2021-07-27 – 2021-08-01 (×6): 75 ug via ORAL
  Filled 2021-07-26 (×6): qty 1

## 2021-07-26 MED ORDER — SODIUM BICARBONATE 650 MG PO TABS: 650.0000 mg | ORAL_TABLET | Freq: Three times a day (TID) | ORAL | Status: DC

## 2021-07-26 MED ORDER — PANTOPRAZOLE SODIUM 40 MG PO TBEC: 40.0000 mg | DELAYED_RELEASE_TABLET | Freq: Every day | ORAL | Status: DC

## 2021-07-26 MED ORDER — REVEFENACIN 175 MCG/3ML IN SOLN
175.0000 ug | Freq: Every day | RESPIRATORY_TRACT | Status: DC
  Administered 2021-07-27 – 2021-08-05 (×9): 175 ug via RESPIRATORY_TRACT
  Filled 2021-07-26 (×10): qty 3

## 2021-07-26 MED ORDER — LEVOFLOXACIN 500 MG PO TABS: 500.0000 mg | ORAL_TABLET | ORAL | Status: DC

## 2021-07-26 MED ORDER — AMLODIPINE BESYLATE 2.5 MG PO TABS
2.5000 mg | ORAL_TABLET | Freq: Every day | ORAL | Status: DC
  Administered 2021-07-26: 2.5 mg via ORAL
  Filled 2021-07-26: qty 1

## 2021-07-26 MED ORDER — ACETAMINOPHEN 650 MG RE SUPP: 650.0000 mg | RECTAL | Status: DC | PRN

## 2021-07-26 MED ORDER — ARFORMOTEROL TARTRATE 15 MCG/2ML IN NEBU: 15.0000 ug | INHALATION_SOLUTION | Freq: Two times a day (BID) | RESPIRATORY_TRACT | Status: DC

## 2021-07-26 MED ORDER — SALINE SPRAY 0.65 % NA SOLN
1.0000 | NASAL | Status: DC | PRN
  Filled 2021-07-26: qty 44

## 2021-07-26 MED ORDER — PANTOPRAZOLE SODIUM 40 MG PO TBEC
40.0000 mg | DELAYED_RELEASE_TABLET | Freq: Every day | ORAL | Status: DC
  Administered 2021-07-27 – 2021-08-05 (×10): 40 mg via ORAL
  Filled 2021-07-26 (×10): qty 1

## 2021-07-26 MED ORDER — REVEFENACIN 175 MCG/3ML IN SOLN: 175.0000 ug | Freq: Every day | RESPIRATORY_TRACT | Status: DC

## 2021-07-26 MED ORDER — PANTOPRAZOLE SODIUM 40 MG PO TBEC
40.0000 mg | DELAYED_RELEASE_TABLET | Freq: Every day | ORAL | Status: DC
Start: 2021-07-26 — End: 2021-07-26

## 2021-07-26 MED ORDER — SALINE SPRAY 0.65 % NA SOLN: 1.0000 | NASAL | 0 refills | Status: AC | PRN

## 2021-07-26 MED ORDER — TAMSULOSIN HCL 0.4 MG PO CAPS
0.4000 mg | ORAL_CAPSULE | Freq: Every day | ORAL | Status: DC
  Administered 2021-07-27 – 2021-08-05 (×10): 0.4 mg via ORAL
  Filled 2021-07-26 (×10): qty 1

## 2021-07-26 MED ORDER — AMLODIPINE BESYLATE 2.5 MG PO TABS: 2.5000 mg | ORAL_TABLET | Freq: Every day | ORAL | Status: DC

## 2021-07-26 MED ORDER — ACETAMINOPHEN 325 MG PO TABS
650.0000 mg | ORAL_TABLET | ORAL | Status: DC | PRN
  Administered 2021-07-26 – 2021-07-28 (×4): 650 mg via ORAL
  Filled 2021-07-26 (×4): qty 2

## 2021-07-26 MED ORDER — ARFORMOTEROL TARTRATE 15 MCG/2ML IN NEBU
15.0000 ug | INHALATION_SOLUTION | Freq: Two times a day (BID) | RESPIRATORY_TRACT | Status: DC
  Administered 2021-07-26 – 2021-08-04 (×16): 15 ug via RESPIRATORY_TRACT
  Filled 2021-07-26 (×22): qty 2

## 2021-07-26 MED ORDER — POLYETHYLENE GLYCOL 3350 17 G PO PACK: 17.0000 g | PACK | Freq: Every day | ORAL | 0 refills | Status: AC

## 2021-07-26 MED ORDER — SENNOSIDES-DOCUSATE SODIUM 8.6-50 MG PO TABS
1.0000 | ORAL_TABLET | Freq: Two times a day (BID) | ORAL | Status: DC
Start: 2021-07-26 — End: 2021-08-05
  Administered 2021-07-26 – 2021-08-05 (×19): 1 via ORAL
  Filled 2021-07-26 (×20): qty 1

## 2021-07-26 MED ORDER — SENNOSIDES-DOCUSATE SODIUM 8.6-50 MG PO TABS: 1.0000 | ORAL_TABLET | Freq: Two times a day (BID) | ORAL | Status: AC

## 2021-07-26 MED ORDER — POLYETHYLENE GLYCOL 3350 17 G PO PACK
17.0000 g | PACK | Freq: Every day | ORAL | Status: DC
  Administered 2021-07-27 – 2021-08-04 (×8): 17 g via ORAL
  Filled 2021-07-26 (×9): qty 1

## 2021-07-26 MED ORDER — AMLODIPINE BESYLATE 2.5 MG PO TABS
2.5000 mg | ORAL_TABLET | Freq: Every day | ORAL | Status: DC
Start: 2021-07-27 — End: 2021-07-31
  Administered 2021-07-27 – 2021-07-31 (×5): 2.5 mg via ORAL
  Filled 2021-07-26 (×5): qty 1

## 2021-07-26 NOTE — Progress Notes (Signed)
Patient ID: Lauren Conner, female   DOB: 16-Oct-1940, 81 y.o.   MRN: [id number] ?INPATIENT REHABILITATION ADMISSION NOTE ? ? ?Arrival Method: wheelchair  ? ?   ?Mental Orientation:A&Ox 4  ? ? ?Assessment: completed per flowsheet  ? ? ?Skin: completed per flowsheet with [place] RN  ? ? ?IV'S: none present on assessment  ? ? ?Pain: patient denies pain  ? ? ?Tubes and Drains: none  ? ? ?Safety Measures: 3 side rails and bed alarm  ? ? ?Vital Signs: per flowsheet  ? ? ?Height and Weight: 5'3" 125.6 Lbs  ? ? ?Rehab Orientation: completed  ? ? ?Family: niece at bedside eric POA will come to visit later  ? ? ? ?Notes:  ?

## 2021-07-26 NOTE — Progress Notes (Signed)
Inpatient Rehabilitation Admission Medication Review by a Pharmacist ? ?A complete drug regimen review was completed for this patient to identify any potential clinically significant medication issues. ? ?High Risk Drug Classes Is patient taking? Indication by Medication  ?Antipsychotic No   ?Anticoagulant No   ?Antibiotic Yes Levofloxacin 500 mg q48 h  - multifocal PNA  ?Opioid No   ?Antiplatelet No   ?Hypoglycemics/insulin No   ?Vasoactive Medication Yes Amlodipine 2.5mg  daily - HTN  ?Chemotherapy No   ?Other Yes Levothyroxine - hypothyroidism ?Yulperi - PNA ?Brovana- PNA ?Sodium bicarbonate ?Tamsulosin - BPH  ? ? ? ?Type of Medication Issue Identified Description of Issue Recommendation(s)  ?Drug Interaction(s) (clinically significant) ?    ?Duplicate Therapy ?    ?Allergy ?    ?No Medication Administration End Date ? Levofloxacin recommended for a total of 14 days by CCM. Started on 07/21/21, Enter stop date of 08/04/21  ?Incorrect Dose ?    ?Additional Drug Therapy Needed ?    ?Significant med changes from prior encounter (inform family/care partners about these prior to discharge).    ?Other ?    ? ? ?Clinically significant medication issues were identified that warrant physician communication and completion of prescribed/recommended actions by midnight of the next day:  No ? ? ?Time spent performing this drug regimen review (minutes):  20 minutes ? ? ?Thank you for involving pharmacy in this patient's care. ? ?Elita Quick, PharmD ?PGY1 Ambulatory Care Pharmacy Resident ?07/26/2021 2:52 PM ? ?**Pharmacist phone directory can be found on [email or web address] listed under [place]** ? ?

## 2021-07-26 NOTE — Progress Notes (Addendum)
[facility] ROUNDING NOTE   Subjective:   Interval History: 81 year old lady with history of hypertension bronchiectasis hypothyroidism CKD with unknown etiology followed at [facility] with baseline serum creatinine 2 mg/dL.  She admitted with acute on chronic kidney insufficiency.  But her renal function appears to return to baseline.  Blood pressure 149/89 pulse 94 temperature 97.5 O2 sats 96% room air  Sodium 136 potassium 4.2 chloride 110 CO2 19 BUN 32 creatinine 2.47 glucose 82 hemoglobin   10.9    Objective:  Vital signs in last 24 hours:  Temp:  [97.4 F (36.3 C)-98.1 F (36.7 C)] 98.1 F (36.7 C) (03/11 0732) Pulse Rate:  [85-100] 92 (03/11 0732) Resp:  [16-20] 18 (03/11 0732) BP: (105-172)/(67-104) 162/84 (03/11 0732) SpO2:  [96 %-99 %] 96 % (03/11 0732)  Weight change:  There were no vitals filed for this visit.  Intake/Output: I/O last 3 completed shifts: In: 2159.4 [P.O.:50; I.V.:1501.4; Blood:608] Out: 2850 [Urine:2850]   Intake/Output this shift:  Total I/O In: 240 [P.O.:240] Out: -   CVS- RRR no murmurs rubs or gallops RS- CTA no wheezes or rales ABD- BS present soft non-distended EXT- no edema   Basic Metabolic Panel: Recent Labs  Lab 07/22/21 0424 07/23/21 0119 07/24/21 0043 07/25/21 0553 07/26/21 0153  NA 132* 133* 134* 136 136  K 4.3 4.0 3.8 3.9 4.2  CL 105 105 108 112* 110  CO2 18* 18* 18* 18* 19*  GLUCOSE 51* 95 109* 80 82  BUN 46* 42* 40* 35* 32*  CREATININE 2.54* 2.59* 2.78* 2.44* 2.47*  CALCIUM 7.9* 8.2* 7.5* 7.5* 8.1*  MG 1.7  --   --  1.9  --   PHOS 4.0  --   --   --   --     Liver Function Tests: Recent Labs  Lab 07/19/21 2200 07/21/21 0414  AST 35 20  ALT 15 12  ALKPHOS 68 53  BILITOT 0.7 0.6  PROT 7.9 6.0*  ALBUMIN 3.1* 2.4*   No results for input(s): LIPASE, AMYLASE in the last 168 hours. No results for input(s): AMMONIA in the last 168 hours.  CBC: Recent Labs  Lab 07/22/21 0424 07/23/21 0119  07/24/21 0043 07/25/21 0553 07/25/21 2140 07/26/21 0153  WBC 8.4 9.3 6.5 6.2  --  7.5  HGB 7.9* 9.0* 7.1* 6.9* 11.0* 10.9*  HCT 24.0* 27.8* 22.2* 20.8* 31.7* 31.8*  MCV 94.9 96.2 96.9 95.9  --  90.6  PLT 193 241 203 203  --  226    Cardiac Enzymes: No results for input(s): CKTOTAL, CKMB, CKMBINDEX, TROPONINI in the last 168 hours.  BNP: Invalid input(s): POCBNP  CBG: Recent Labs  Lab 07/22/21 0746 07/22/21 0836 07/22/21 1146 07/23/21 0118  GLUCAP 44* 111* 43 96    Microbiology: Results for orders placed or performed during the hospital encounter of 07/21/21  MRSA Next Gen by PCR, Nasal     Status: None   Collection Time: 07/21/21  5:44 PM   Specimen: Nasal Mucosa; Nasal Swab  Result Value Ref Range Status   MRSA by PCR Next Gen NOT DETECTED NOT DETECTED Final    Comment: (NOTE) The GeneXpert MRSA Assay (FDA approved for NASAL specimens only), is one component of a comprehensive MRSA colonization surveillance program. It is not intended to diagnose MRSA infection nor to guide or monitor treatment for MRSA infections. Test performance is not FDA approved in patients less than 16 years old. Performed at [facility], [place] [address],  [place],  [address]     Coagulation Studies: No results for input(s): LABPROT, INR in the last 72 hours.  Urinalysis: Recent Labs    07/24/21 1346  COLORURINE STRAW*  LABSPEC 1.005  PHURINE 5.0  GLUCOSEU NEGATIVE  HGBUR SMALL*  BILIRUBINUR NEGATIVE  KETONESUR NEGATIVE  PROTEINUR 100*  NITRITE NEGATIVE  LEUKOCYTESUR NEGATIVE      Imaging: CT ABDOMEN PELVIS WO CONTRAST  Result Date: 07/25/2021 CLINICAL DATA:  Abdominal pain, acute, nonlocalized anemia, unclear etiology, r/o RPH EXAM: CT ABDOMEN AND PELVIS WITHOUT CONTRAST TECHNIQUE: Multidetector CT imaging of the abdomen and pelvis was performed following the standard protocol without IV contrast. RADIATION DOSE REDUCTION: This exam was performed according to  the departmental dose-optimization program which includes automated exposure control, adjustment of the mA and/or kV according to patient size and/or use of iterative reconstruction technique. COMPARISON:  CT 07/20/2021 FINDINGS: Lower chest: Large hiatal hernia. 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 Small bilateral pleural effusions. Hepatobiliary: Unchanged hepatic cysts. The gallbladder is unremarkable. Pancreas: Unremarkable. No pancreatic ductal dilatation or surrounding inflammatory changes. Spleen: Normal in size.  Multiple calcified splenic granulomas. Adrenals/Urinary Tract: Adrenal glands are unremarkable. Interval placement of the Foley catheter with decompression of the bladder and renal collecting systems. Unchanged multiple bilateral renal cysts. Stomach/Bowel: Large hiatal hernia with intrathoracic stomach. There is no evidence of bowel obstruction.There is retained barium within the colon. The appendix is not definitively visualized. Extensive sigmoid diverticulosis without evidence of acute diverticulitis. Vascular/Lymphatic: Aortoiliac atherosclerotic calcifications. No AAA. No lymphadenopathy. Reproductive: Unremarkable. Other: No free air. No abdominopelvic ascites. No evidence of retroperitoneal hematoma as clinically questioned. Musculoskeletal: No acute osseous abnormality. No suspicious lytic or blastic lesions. Multilevel degenerative changes of the spine. IMPRESSION: No acute abdominopelvic abnormality. Interval Foley catheter placement with decompression of the bladder and renal collecting systems. Colonic diverticulosis. Unchanged large hiatal hernia. Unchanged parenchymal findings the lung bases consistent with chronic atypical mycobacterial infection. Small bilateral pleural effusions. Electronically Signed   By: Maurine Simmering M.D.   On: 07/25/2021 10:56   CT HEAD WO  CONTRAST (5MM)  Result Date: 07/25/2021 CLINICAL DATA:  Subarachnoid hemorrhage follow-up EXAM: CT HEAD WITHOUT CONTRAST TECHNIQUE: Contiguous axial images were obtained from the base of the skull through the vertex without intravenous contrast. RADIATION DOSE REDUCTION: This exam was performed according to the departmental dose-optimization program which includes automated exposure control, adjustment of the mA and/or kV according to patient size and/or use of iterative reconstruction technique. COMPARISON:  Three days ago FINDINGS: Brain: Unchanged shape and extent of parenchymal hemorrhage in the right temporal and parietal lobes with irregular shape limiting accurate measurement. Small volume overlying subarachnoid blood. Midline shift measures 3 mm. No entrapment or interval infarct Vascular: Atheromatous calcification Skull: Normal. Negative for fracture or focal lesion. Sinuses/Orbits: No acute finding. IMPRESSION: Unchanged right cerebral hemorrhage with adjacent edema. Midline shift measures 3 mm. Electronically Signed   By: Jorje Guild M.D.   On: 07/25/2021 11:28   US RENAL  Result Date: 07/24/2021 CLINICAL DATA:  Acute kidney injury. EXAM: RENAL / URINARY TRACT ULTRASOUND COMPLETE COMPARISON:  CT abdomen and pelvis dated July 20, 2021. FINDINGS: Right Kidney: Renal measurements: 10.2 x 4.4 x 3.7 cm = volume: 86 mL. Increased echogenicity. No mass or hydronephrosis visualized. Multiple cysts measuring up to 1.7 cm. Left Kidney: Renal measurements: 9.7 x 5.2 x 4.3 cm = volume: 113 mL. Increased echogenicity.  No mass or hydronephrosis visualized. Multiple cysts measuring up to 1.6 cm. Bladder: Decompressed by Foley catheter. Other: None. IMPRESSION: 1. No acute abnormality. Resolved bilateral hydronephrosis after Foley catheter placement. 2. Increased bilateral renal echogenicity, consistent with medical renal disease. Electronically Signed   By: Titus Dubin M.D.   On: 07/24/2021 15:31   DG  Swallowing Func-Speech Pathology  Result Date: 07/24/2021 Table formatting from the original result was not included. Objective Swallowing Evaluation: Type of Study: MBS-Modified Barium Swallow Study  Patient Details Name: CEDRIC DENISON MRN: [id number] Date of Birth: 16-Jun-1940 Today's Date: 07/24/2021 Time: SLP Start Time (ACUTE ONLY): 83 -SLP Stop Time (ACUTE ONLY): 1247 SLP Time Calculation (min) (ACUTE ONLY): 17 min Past Medical History: No past medical history on file. Past Surgical History: No past surgical history on file. HPI: 81 yo F with end-stage renal failure with CKD, cardiac dysfunction with arrhythmia and SVT, hypothyroidism, moderate to severe protein calorie malnutrition, chronic bronchiectasis, She was admitted with altered mental status and confusion. She was found to have leukocytosis with elevated lactate and metabolic acidosis. Chest CT with cylindrical and saccular bronchiectatic changes with chronic bronchitic airways bilaterally with mucoid impaction and bibasilar fibrotic changes and large hiatal hernia. MRI 25 mL intraparenchymal hematoma centered at the right temporoccipital region. associated small volume subarachnoid hemorrhage within the adjacent right cerebral hemisphere, underlying age-related cerebral atrophy with moderately advanced  chronic microvascular ischemic disease, small remote left cerebellar infarct.  Subjective: Pt alert, confusion evident. Yelling for people who are not present in room. Eyes closed intermittently. Cleared with RN. Son present.  Recommendations for follow up therapy are one component of a multi-disciplinary discharge planning process, led by the attending physician.  Recommendations may be updated based on patient status, additional functional criteria and insurance authorization. Assessment / Plan / Recommendation Clinical Impressions 07/24/2021 Clinical Impression Pt was seen in radiology suite for modified barium swallow study. Trials of puree solids,  regular texture solids, a 40mm barium tablet, and thin liquids via cup and straw were administered. Pt presented with mild pharyngoesophageal dysphagia characterized by reduction in pharyngeal constriction, and PES distension. A CP bar was intermittently noted and residue was noted at the level of the PES. Solid residue was cleared with a liquid wash and liquid residue was reduced with secondary swallows. It is recommended that the pt's current diet of regular solids and thin liquids be continued at this time. SLP will see patient once more for education; however, it is anticipated that further skilled SLP services for swallowing will likely not be needed beyond that. SLP Visit Diagnosis Dysphagia, pharyngoesophageal phase (R13.14) Attention and concentration deficit following -- Frontal lobe and executive function deficit following -- Impact on safety and function Mild aspiration risk   Treatment Recommendations 07/24/2021 Treatment Recommendations Therapy as outlined in treatment plan below   Prognosis 07/24/2021 Prognosis for Safe Diet Advancement Good Barriers to Reach Goals Cognitive deficits Barriers/Prognosis Comment -- Diet Recommendations 07/24/2021 SLP Diet Recommendations Regular solids;Thin liquid Liquid Administration via Cup;Straw Medication Administration Whole meds with liquid Compensations Small sips/bites;Slow rate Postural Changes Seated upright at 90 degrees   Other Recommendations 07/24/2021 Recommended Consults -- Oral Care Recommendations Oral care BID Other Recommendations -- Follow Up Recommendations Acute inpatient rehab (3hours/day) Assistance recommended at discharge Frequent or constant Supervision/Assistance Functional Status Assessment Patient has had a recent decline in their functional status and demonstrates the ability to make significant improvements in function in a reasonable and predictable amount of time. Frequency and Duration  07/24/2021 Speech Therapy Frequency (ACUTE ONLY) min 1  x/week Treatment Duration 1 week   Oral Phase 07/24/2021 Oral Phase WFL Oral - Pudding Teaspoon -- Oral - Pudding Cup -- Oral - Honey Teaspoon -- Oral - Honey Cup -- Oral - Nectar Teaspoon -- Oral - Nectar Cup -- Oral - Nectar Straw -- Oral - Thin Teaspoon -- Oral - Thin Cup -- Oral - Thin Straw -- Oral - Puree -- Oral - Mech Soft -- Oral - Regular -- Oral - Multi-Consistency -- Oral - Pill -- Oral Phase - Comment --  Pharyngeal Phase 07/24/2021 Pharyngeal Phase WFL Pharyngeal- Pudding Teaspoon -- Pharyngeal -- Pharyngeal- Pudding Cup -- Pharyngeal -- Pharyngeal- Honey Teaspoon -- Pharyngeal -- Pharyngeal- Honey Cup -- Pharyngeal -- Pharyngeal- Nectar Teaspoon -- Pharyngeal -- Pharyngeal- Nectar Cup -- Pharyngeal -- Pharyngeal- Nectar Straw -- Pharyngeal -- Pharyngeal- Thin Teaspoon -- Pharyngeal -- Pharyngeal- Thin Cup Pharyngeal residue - cp segment;Reduced pharyngeal peristalsis Pharyngeal -- Pharyngeal- Thin Straw Pharyngeal residue - cp segment;Reduced pharyngeal peristalsis Pharyngeal -- Pharyngeal- Puree Pharyngeal residue - cp segment;Reduced pharyngeal peristalsis Pharyngeal -- Pharyngeal- Mechanical Soft -- Pharyngeal -- Pharyngeal- Regular -- Pharyngeal -- Pharyngeal- Multi-consistency -- Pharyngeal -- Pharyngeal- Pill -- Pharyngeal -- Pharyngeal Comment --  Cervical Esophageal Phase  07/24/2021 Cervical Esophageal Phase Impaired Pudding Teaspoon -- Pudding Cup -- Honey Teaspoon -- Honey Cup -- Nectar Teaspoon -- Nectar Cup -- Nectar Straw -- Thin Teaspoon -- Thin Cup Prominent cricopharyngeal segment;Reduced cricopharyngeal relaxation Thin Straw Prominent cricopharyngeal segment;Reduced cricopharyngeal relaxation Puree -- Mechanical Soft -- Regular -- Multi-consistency -- Pill -- Cervical Esophageal Comment -- Tobie Poet I. Hardin Negus, [place], [place] Office number [phone] Pager [phone] Horton Marshall 07/24/2021, 2:07 PM                       Medications:     sodium  chloride   Intravenous Once   arformoterol  15 mcg Nebulization BID   Chlorhexidine Gluconate Cloth  6 each Topical Daily   levofloxacin  500 mg Oral Q48H   levothyroxine  75 mcg Oral Q0600   pantoprazole  40 mg Oral Daily   polyethylene glycol  17 g Oral Daily   revefenacin  175 mcg Nebulization Daily   senna-docusate  1 tablet Oral BID   sodium bicarbonate  650 mg Oral TID   tamsulosin  0.4 mg Oral Daily   acetaminophen **OR** acetaminophen (TYLENOL) oral liquid 160 mg/5 mL **OR** acetaminophen, ondansetron (ZOFRAN) IV, sodium chloride  Assessment/ Plan:  Acute on chronic renal sufficiency based on serum creatinine at 2 mg/dL.  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 Hypertension/volume appears euvolemic blood pressures were a little low they have returned.  Restarting Norvasc would be reasonable I would use a low-dose of 2.5 mg daily .  Metabolic acidosis continue sodium bicarbonate 3 times daily Anemia does not appear to be an issue at this point.  Status post transfusion   LOS: 5 Sherril Croon @TODAY @10 :28 AM

## 2021-07-26 NOTE — H&P (Signed)
Physical Medicine and Rehabilitation Admission H&P   CC: Encephalopathy  HPI: Lauren Conner is a 81 year old right-handed female with history of anemia of chronic disease paroxysmal SVT, hypothyroidism, hypertension, CKD stage IV followed by Dr.Lehrich with nephrology at [facility] with creatinine baseline 2.0.  Per chart review patient lives alone.  1 level home 2 steps to enter.  Independent and active prior to admission.  Son and daughter-in-law are planning on moving in with her after discharge to provide assistance as needed.  Presented to [facility] 07/19/2021 after being found down on her left side with altered mental status and left-sided weakness.  Patient was tachycardic 124 with low-grade fever of 100.5 and blood pressure 154/121, oxygen saturations 100% room air.  Cranial CT scan showed large area of acute intraparenchymal hemorrhage within the right temporal parietal lobe 4.0 x 4.0 x 4.1 cm with surrounding vasogenic edema and mild effacement of the occipital horn and atrium of the right lateral ventricle.  Small amount of subarachnoid hemorrhage within [place] at the anterior right temporal lobe.  3 mm of right to left midline shift.  Admission chemistry sodium 126 chloride 97 glucose 157 BUN 41 creatinine 2.13, WBC 17,100, hemoglobin 10.2, lactic acid 3.5 troponin 55-33, blood cultures no growth to date, urinalysis negative nitrite, procalcitonin 0.32, COVID-19 and influenza testing negative.  CT of the chest without contrast showed significant motion artifact bilaterally however notable cylindrical and saccular bronchiectatic changes with chronic bronchitic airway bilaterally.  CT of chest abdomen pelvis showed bilateral hydronephrosis and proximal hydroureter.  MRI/MRA of the head 25 mL intraparenchymal hematoma centered at the right temporal occipital region.  No vascular abnormality seen underlying the right cerebral hemorrhage.  Echocardiogram with ejection fraction of 65 to 70% no wall motion  abnormalities grade 1 diastolic dysfunction.  Patient was placed on broad-spectrum intravenous antibiotic for suspected sepsis.  Urology services Dr. Diona Fanti consulted in regards to urinary retention/significant bilateral hydronephrosis recommended Foley catheter tube and Flomax x2 weeks and follow-up outpatient.  A follow-up renal ultrasound 07/24/2021 revealed no acute abnormality resolved bilateral hydronephrosis.  Renal services Dr. Moshe Cipro consulted in regards to longstanding CKD stage IV with baseline creatinine 2.0 elevating to 2.78 felt to be related to BP variability going from very high to very low normal.  Amlodipine and metoprolol discontinued and lattest creatinine 2.44.  Her antibiotic regimen has been transition to Levaquin 500 mg every 48 hours 07/23/2021 per PCCM x14 days.  RVP negative, blood cultures negative to date.  Anemia of chronic disease A's hemoglobin 6.9 awaiting plan transfusion.  She is tolerating a regular consistency diet.  Therapy evaluations completed due to patient decreased functional mobility left side weakness was admitted for a comprehensive rehab program. Currently complains of weakness.   Review of Systems  Constitutional:  Negative for chills and fever.  HENT:  Negative for hearing loss.   Eyes:  Negative for blurred vision and double vision.  Respiratory:  Negative for cough and shortness of breath.   Cardiovascular:  Positive for leg swelling. Negative for chest pain and palpitations.  Gastrointestinal:  Positive for constipation. Negative for heartburn, nausea and vomiting.  Genitourinary:  Positive for flank pain. Negative for dysuria and hematuria.  Musculoskeletal:  Positive for myalgias.  Skin:  Negative for rash.  Neurological:  Positive for speech change and weakness.  All other systems reviewed and are negative. History reviewed. No pertinent past medical history. History reviewed. No pertinent surgical history. History reviewed. No pertinent  family history. Social History:  has no history on file for tobacco use, alcohol use, and drug use. Allergies:  Allergies  Allergen Reactions   Ethambutol Other (See Comments)    Patient reports cramping   Iodinated Contrast Media Rash   Medications Prior to Admission  Medication Sig Dispense Refill   albuterol (PROVENTIL) (2.5 MG/3ML) 0.083% nebulizer solution Take 2.5 mg by nebulization every 6 (six) hours as needed for wheezing.     albuterol (VENTOLIN HFA) 108 (90 Base) MCG/ACT inhaler Inhale 2 puffs into the lungs every 4 (four) hours as needed for wheezing.     amLODipine (NORVASC) 2.5 MG tablet Take 1 tablet (2.5 mg total) by mouth daily.     arformoterol (BROVANA) 15 MCG/2ML NEBU Take 2 mLs (15 mcg total) by nebulization 2 (two) times daily. 120 mL    [START ON 07/27/2021] levofloxacin (LEVAQUIN) 500 MG tablet Take 1 tablet (500 mg total) by mouth every other day.     levothyroxine (SYNTHROID) 75 MCG tablet Take 75 mcg by mouth daily.     [START ON 07/27/2021] pantoprazole (PROTONIX) 40 MG tablet Take 1 tablet (40 mg total) by mouth daily.     [START ON 07/27/2021] polyethylene glycol (MIRALAX / GLYCOLAX) 17 g packet Take 17 g by mouth daily. 14 each 0   [START ON 07/27/2021] revefenacin (YUPELRI) 175 MCG/3ML nebulizer solution Take 3 mLs (175 mcg total) by nebulization daily. 90 mL    senna-docusate (SENOKOT-S) 8.6-50 MG tablet Take 1 tablet by mouth 2 (two) times daily.     sodium bicarbonate 650 MG tablet Take 1 tablet (650 mg total) by mouth 3 (three) times daily.     sodium chloride (OCEAN) 0.65 % SOLN nasal spray Place 1 spray into both nostrils as needed for congestion.  0   [START ON 07/27/2021] tamsulosin (FLOMAX) 0.4 MG CAPS capsule Take 1 capsule (0.4 mg total) by mouth daily. 30 capsule    Home: Home Living Family/patient expects to be discharged to:: Private residence Living Arrangements: Alone Available Help at Discharge: Family, Available 24 hours/day Type of Home:  House Home Access: Stairs to enter Technical brewer of Steps: 2 Entrance Stairs-Rails: None Home Layout: One level Bathroom Shower/Tub: Multimedia programmer: Programmer, systems: Yes Home Equipment: None  Lives With: Alone   Functional History: Prior Function Prior Level of Function : Independent/Modified Independent Mobility Comments: driving, yard work, mowing the yard ADLs Comments: independent   Functional Status:  Mobility: Bed Mobility Overal bed mobility: Needs Assistance Bed Mobility: Supine to Sit, Sit to Supine Rolling: Min assist Sidelying to sit: Min assist Supine to sit: Min guard Sit to supine: Min guard General bed mobility comments: min cuing for sequencing and direction.  pt not needing physical assist Transfers Overall transfer level: Needs assistance Transfers: Sit to/from Stand, Bed to chair/wheelchair/BSC Sit to Stand: Min assist Bed to/from chair/wheelchair/BSC transfer type:: Stand pivot Stand pivot transfers: Min assist Step pivot transfers: Min assist General transfer comment: cues for hand placement, helping pt determine when to sit down.  BP in standing 2nd trial 89/63  symptomatic and significantly worse symptoms 2nd trial. Ambulation/Gait Ambulation/Gait assistance: Min assist Gait Distance (Feet): 3 Feet (x2 with min A while feeling well, light mod when pt with BP symptoms.) Assistive device: 1 person hand held assist Gait Pattern/deviations: Step-to pattern General Gait Details: mildly unsteady, tentative steps, a little wider BOS.  Pt showing most incoordination with w/shift L to move R LE. Gait velocity interpretation: <1.31 ft/sec, indicative of household ambulator Pre-gait  activities: At bedside working on w/shift and stepping until symptomatic with low BP   ADL: ADL Overall ADL's : Needs assistance/impaired Grooming: Wash/dry hands, Wash/dry face, Set up, Sitting Upper Body Bathing: Min guard,  Sitting Lower Body Bathing: Moderate assistance, Sit to/from stand Upper Body Dressing : Minimal assistance, Sitting Lower Body Dressing: Moderate assistance, Sit to/from stand Toilet Transfer: Minimal assistance, BSC/3in1, Stand-pivot   Cognition: Cognition Overall Cognitive Status: Impaired/Different from baseline Arousal/Alertness: Awake/alert Orientation Level: Oriented X4 Year:  (initially 2013, self corrected) Month: March Day of Week: Incorrect Attention: Sustained Sustained Attention: Appears intact (suspect she will breakdown in function) Memory:  (TBA) Awareness: Impaired Awareness Impairment: Intellectual impairment, Anticipatory impairment Problem Solving:  (very basic intact) Safety/Judgment: Impaired Cognition Arousal/Alertness: Awake/alert Behavior During Therapy: WFL for tasks assessed/performed Overall Cognitive Status: Impaired/Different from baseline Area of Impairment: Problem solving Current Attention Level: Selective Following Commands: Follows one step commands consistently, Follows one step commands with increased time Safety/Judgement: Decreased awareness of safety, Decreased awareness of deficits Awareness: Emergent Problem Solving: Slow processing General Comments: inattension improved    Physical Exam: Blood pressure (!) 161/87, pulse 92, temperature 97.6 F (36.4 C), resp. rate 16, SpO2 97 %. Physical Exam Gen: no distress, normal appearing HEENT: oral mucosa pink and moist, NCAT, left lower quadrantanopia Cardio: Reg rate Chest: normal effort, normal rate of breathing Abd: soft, non-distended Ext: no edema Psych: pleasant, normal affect Genitourinary:    Comments: Foley tube in place Neurological:     Comments: Patient is alert.  She appears somewhat anxious.  Makes eye contact with examiner and follows commands.  Aox3. 5/5 strength and sensation intact.   Results for orders placed or performed during the hospital encounter of 07/21/21  (from the past 48 hour(s))  CBC     Status: Abnormal   Collection Time: 07/25/21  5:53 AM  Result Value Ref Range   WBC 6.2 4.0 - 10.5 K/uL   RBC 2.17 (L) 3.87 - 5.11 MIL/uL   Hemoglobin 6.9 (LL) 12.0 - 15.0 g/dL    Comment: REPEATED TO VERIFY THIS CRITICAL RESULT HAS VERIFIED AND BEEN CALLED TO RN JULIET VICTORYAIEOS BY PHONG NGUYEN ON 03 10 2023 AT 0631, AND HAS BEEN READ BACK.     HCT 20.8 (L) 36.0 - 46.0 %   MCV 95.9 80.0 - 100.0 fL   MCH 31.8 26.0 - 34.0 pg   MCHC 33.2 30.0 - 36.0 g/dL   RDW 13.8 11.5 - 15.5 %   Platelets 203 150 - 400 K/uL   nRBC 0.0 0.0 - 0.2 %    Comment: Performed at [facility] [address], [place], [place] [address]  Basic metabolic panel     Status: Abnormal   Collection Time: 07/25/21  5:53 AM  Result Value Ref Range   Sodium 136 135 - 145 mmol/L   Potassium 3.9 3.5 - 5.1 mmol/L   Chloride 112 (H) 98 - 111 mmol/L   CO2 18 (L) 22 - 32 mmol/L   Glucose, Bld 80 70 - 99 mg/dL    Comment: Glucose reference range applies only to samples taken after fasting for at least 8 hours.   BUN 35 (H) 8 - 23 mg/dL   Creatinine, Ser 2.44 (H) 0.44 - 1.00 mg/dL   Calcium 7.5 (L) 8.9 - 10.3 mg/dL   GFR, Estimated 20 (L) >60 mL/min    Comment: (NOTE) Calculated using the CKD-EPI Creatinine Equation (2021)    Anion gap 6 5 - 15  Comment: Performed at [facility], [place] [address] [place], [place] [address]  Iron and TIBC     Status: Abnormal   Collection Time: 07/25/21  5:53 AM  Result Value Ref Range   Iron 35 28 - 170 ug/dL   TIBC 235 (L) 250 - 450 ug/dL   Saturation Ratios 15 10.4 - 31.8 %   UIBC 200 ug/dL    Comment: Performed at [facility], [place] [address] [place], [place] [address]  Ferritin     Status: None   Collection Time: 07/25/21  5:53 AM  Result Value Ref Range   Ferritin 45 11 - 307 ng/mL    Comment: Performed at [facility], [place] [address], [place], [place] [address]  Magnesium     Status: None   Collection  Time: 07/25/21  5:53 AM  Result Value Ref Range   Magnesium 1.9 1.7 - 2.4 mg/dL    Comment: Performed at [facility] [address] [place], [place] [address]  ABO/Rh     Status: None   Collection Time: 07/25/21  5:53 AM  Result Value Ref Range   ABO/RH(D)      O POS Performed at [facility] [address], [place], [place] [address]   Type and screen [place]     Status: None   Collection Time: 07/25/21  7:55 AM  Result Value Ref Range   ABO/RH(D) O POS    Antibody Screen NEG    Sample Expiration 07/28/2021,2359    Unit Number [id number]    Blood Component Type RBC LR PHER1    Unit division 00    Status of Unit ISSUED,FINAL    Transfusion Status OK TO TRANSFUSE    Crossmatch Result      Compatible Performed at [facility], [place] [address], [place], [place] [address]    Unit Number [id number]    Blood Component Type RBC LR PHER1    Unit division 00    Status of Unit ISSUED,FINAL    Transfusion Status OK TO TRANSFUSE    Crossmatch Result Compatible   Prepare RBC (crossmatch)     Status: None   Collection Time: 07/25/21  7:55 AM  Result Value Ref Range   Order Confirmation      ORDER PROCESSED BY BLOOD BANK Performed at [facility], [place] [address], [place], [place] [address]   Prepare RBC (crossmatch)     Status: None   Collection Time: 07/25/21  9:52 AM  Result Value Ref Range   Order Confirmation      BB SAMPLE OR UNITS ALREADY AVAILABLE Performed at [facility], [address] [place], [place] [address]   Prepare RBC (crossmatch)     Status: None   Collection Time: 07/25/21  4:18 PM  Result Value Ref Range   Order Confirmation      ORDER PROCESSED BY BLOOD BANK Performed at [facility], [place] [address] [place], [place] [address]   Hemoglobin and hematocrit, blood     Status: Abnormal   Collection Time: 07/25/21  9:40 PM  Result Value Ref Range   Hemoglobin 11.0 (L) 12.0 - 15.0 g/dL     Comment: REPEATED TO VERIFY POST TRANSFUSION SPECIMEN    HCT 31.7 (L) 36.0 - 46.0 %    Comment: Performed at [facility] [address] [place], [place] [address]  CBC  Status: Abnormal   Collection Time: 07/26/21  1:53 AM  Result Value Ref Range   WBC 7.5 4.0 - 10.5 K/uL   RBC 3.51 (L) 3.87 - 5.11 MIL/uL   Hemoglobin 10.9 (L) 12.0 - 15.0 g/dL   HCT 31.8 (L) 36.0 - 46.0 %   MCV 90.6 80.0 - 100.0 fL   MCH 31.1 26.0 - 34.0 pg   MCHC 34.3 30.0 - 36.0 g/dL   RDW 15.5 11.5 - 15.5 %   Platelets 226 150 - 400 K/uL   nRBC 0.0 0.0 - 0.2 %    Comment: Performed at [facility], [place] [address] [place], [place] [address]  Basic metabolic panel     Status: Abnormal   Collection Time: 07/26/21  1:53 AM  Result Value Ref Range   Sodium 136 135 - 145 mmol/L   Potassium 4.2 3.5 - 5.1 mmol/L   Chloride 110 98 - 111 mmol/L   CO2 19 (L) 22 - 32 mmol/L   Glucose, Bld 82 70 - 99 mg/dL    Comment: Glucose reference range applies only to samples taken after fasting for at least 8 hours.   BUN 32 (H) 8 - 23 mg/dL   Creatinine, Ser 2.47 (H) 0.44 - 1.00 mg/dL   Calcium 8.1 (L) 8.9 - 10.3 mg/dL   GFR, Estimated 19 (L) >60 mL/min    Comment: (NOTE) Calculated using the CKD-EPI Creatinine Equation (2021)    Anion gap 7 5 - 15    Comment: Performed at [facility] [address], [place], [place] [address]   CT ABDOMEN PELVIS WO CONTRAST  Result Date: 07/25/2021 CLINICAL DATA:  Abdominal pain, acute, nonlocalized anemia, unclear etiology, r/o RPH EXAM: CT ABDOMEN AND PELVIS WITHOUT CONTRAST TECHNIQUE: Multidetector CT imaging of the abdomen and pelvis was performed following the standard protocol without IV contrast. RADIATION DOSE REDUCTION: This exam was performed according to the departmental dose-optimization program which includes automated exposure control, adjustment of the mA and/or kV according to patient size and/or use of iterative reconstruction technique. COMPARISON:   CT 07/20/2021 FINDINGS: Lower chest: Large hiatal hernia. 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 Small bilateral pleural effusions. Hepatobiliary: Unchanged hepatic cysts. The gallbladder is unremarkable. Pancreas: Unremarkable. No pancreatic ductal dilatation or surrounding inflammatory changes. Spleen: Normal in size.  Multiple calcified splenic granulomas. Adrenals/Urinary Tract: Adrenal glands are unremarkable. Interval placement of the Foley catheter with decompression of the bladder and renal collecting systems. Unchanged multiple bilateral renal cysts. Stomach/Bowel: Large hiatal hernia with intrathoracic stomach. There is no evidence of bowel obstruction.There is retained barium within the colon. The appendix is not definitively visualized. Extensive sigmoid diverticulosis without evidence of acute diverticulitis. Vascular/Lymphatic: Aortoiliac atherosclerotic calcifications. No AAA. No lymphadenopathy. Reproductive: Unremarkable. Other: No free air. No abdominopelvic ascites. No evidence of retroperitoneal hematoma as clinically questioned. Musculoskeletal: No acute osseous abnormality. No suspicious lytic or blastic lesions. Multilevel degenerative changes of the spine. IMPRESSION: No acute abdominopelvic abnormality. Interval Foley catheter placement with decompression of the bladder and renal collecting systems. Colonic diverticulosis. Unchanged large hiatal hernia. Unchanged parenchymal findings the lung bases consistent with chronic atypical mycobacterial infection. Small bilateral pleural effusions. Electronically Signed   By: Maurine Simmering M.D.   On: 07/25/2021 10:56   CT HEAD WO CONTRAST (5MM)  Result Date: 07/25/2021 CLINICAL DATA:  Subarachnoid hemorrhage follow-up EXAM: CT HEAD WITHOUT CONTRAST TECHNIQUE: Contiguous axial  images were obtained from the base of the skull through the  vertex without intravenous contrast. RADIATION DOSE REDUCTION: This exam was performed according to the departmental dose-optimization program which includes automated exposure control, adjustment of the mA and/or kV according to patient size and/or use of iterative reconstruction technique. COMPARISON:  Three days ago FINDINGS: Brain: Unchanged shape and extent of parenchymal hemorrhage in the right temporal and parietal lobes with irregular shape limiting accurate measurement. Small volume overlying subarachnoid blood. Midline shift measures 3 mm. No entrapment or interval infarct Vascular: Atheromatous calcification Skull: Normal. Negative for fracture or focal lesion. Sinuses/Orbits: No acute finding. IMPRESSION: Unchanged right cerebral hemorrhage with adjacent edema. Midline shift measures 3 mm. Electronically Signed   By: Jorje Guild M.D.   On: 07/25/2021 11:28      Blood pressure (!) 161/87, pulse 92, temperature 97.6 F (36.4 C), resp. rate 16, SpO2 97 %.  Medical Problem List and Plan: 1. 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  Antithrombotics: -DVT/anticoagulation:  Mechanical: Antiembolism stockings, thigh (TED hose) Bilateral lower extremities  -antiplatelet therapy: N/A 3. Pain Management: Tylenol as needed 4. Mood: Provide emotional support  -antipsychotic agents: N/A 5. Neuropsych: This patient is capable of making decisions on her own behalf. 6. Skin/Wound Care: Routine skin checks 7. Fluids/Electrolytes/Nutrition: Routine in and outs with follow-up chemistries 8.  CAP with baseline bronchiectasis.  Follow-up PCCM.  Complete 14-day course Levaquin initiated 07/23/2021.  Continue inhalers as directed 9.  Acute urinary retention with significant bilateral hydronephrosis.  Follow-up 07/23/2021 Dr. Diona Fanti.   Recommend CONTINUE FOLEY CATHETER TUBE  and Flomax for at least 2 weeks and follow-up outpatient.  Follow-up renal ultrasound 07/24/2021 showed resolved bilateral hydronephrosis 10.  AKI on CKD stage IV.  Patient followed by Dr[facility]Lehrich at [facility] with baseline creatinine 2.0.  Currently seen by Dr. Moshe Cipro while at [facility].  Currently with worsening of renal function suspect related to BP variability with amlodipine and metoprolol discontinued.  Follow-up chemistries 11.  Hypertension.  Amlodipine and Lopressor currently discontinued.  Continue to monitor 12.  Hypothyroidism: continue Synthroid 13.  History of cardiac dysfunction with SVT.  Cardiac rate controlled. Monitor HR TID.  14.  Anemia of chronic disease. Hgb stable at 10.9 on 3/11.  Repeat Hgb tomorrow. Transfused 2 units 07/25/2021 patient had been receiving Aranesp every 2 weeks  Cathlyn Parsons, PA-C   I have personally performed a face to face diagnostic evaluation, including, but not limited to relevant history and physical exam findings, of this patient and developed relevant assessment and plan.  Additionally, I have reviewed and concur with the physician assistant's documentation above.  Izora Ribas, MD 07/26/2021

## 2021-07-26 NOTE — Discharge Summary (Signed)
Physician Discharge Summary   Patient: Lauren Conner MRN: [id number] DOB: [birth year]  Admit date:     07/21/2021  Discharge date: 07/26/21  Discharge Physician: Estill Cotta, MD   PCP: [facility]   Recommendations at discharge:   Continue levofloxacin 500 mg p.o. every 48 hours for total 14 days, started on 07/21/21 Continue Yupelri and Brovana nebs.  Pulmonology recommended to stop outpatient inhaled steroids in the setting of bronchiectasis Please continue Foley catheter.  Per Dr. Diona Fanti, she will need at least 2 weeks after Foley catheter, was placed on admission and then had recommended follow-up in the office for voiding trial.  If the patient is in CIR at the end of 2 weeks, please consult urology and remove the Foley for voiding trial.  patient will need MRI or CT head with contrast. Please follow neurology/stroke service recommendations when it would be feasible with her renal function Started amlodipine 2.5 mg daily, please increase dose if BP still elevated  Discharge Diagnoses:    Encephalopathy acute  Acute right temporoparietal IPH   Pneumonia of both lungs due to infectious organism   Acute urinary retention with significant bilateral hydronephrosis  Acute kidney injury with NAG metabolic acidosis  CKD stage IV   Acute on chronic anemia, likely due to CKD and hemodilution    Hypothyroidism    Fall    Moderate protein calorie malnutrition   Mild hyponatremia  Hospital Course: Patient is a 81 year old female with history of SVT, HTN, bronchiectasis, MAI infection followed at [facility] and completed 2 years of Zithromax and rifampin, CKD stage IV initially presented to [facility] after a fall with altered mental status.  Per family she is normally mobile and very independent.  Family found her confused, on the floor with bruising to the face and was brought to ED.  Patient was found to be encephalopathic, febrile.  Chest x-ray with multifocal opacities.  She was admitted,  remained encephalopathic hence CT head was obtained which showed large right temporoparietal IPH with 3 mm midline shift and vasogenic edema.  Patient was transferred to [facility] for further management. 3/5 presented to [facility], admitted to [facility] 3/6 txfr to [facility] for 3% after [place] showed large IPH with edema and midline shift 3/7 Looks better this morning, awake and passed swallow  3/8 TRH assumed care at [facility]  Assessment and Plan:  Acute encephalopathy, acute right temporoparietal IPH in the setting of fall -Possibly hypertensive although may be traumatic.  Neurology following. -MRI/MRA without contrast do not suggest underlying vascular abnormality. -Repeat CT head 3/7 showed stable appearance of right parietal and temporal lobe hemorrhage with surrounding edema and mass effect.  Stable subarachnoid blood along the right temporal lobe. -Not able to perform MRI or CT with contrast due to elevated creatinine.  Renal function is improving. -MRA showed mild atheromatous irregularity about the carotid systems without hemodynamically significant or correctable stenosis.  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 -2D echo showed EF of 65 to 70% -Currently alert and oriented, mental status at baseline. -PT recommended CIR - CT head done per family's request, unchanged right cerebral hemorrhage with adjacent edema, midline shift measures 3 mm      Pneumonia of both lungs due to infectious organism, in the setting of chronic bronchiectasis -Followed at [facility], currently not on chronic antibiotics -Patient followed by PCCM, recommended Levaquin for 14 days for likely CAP with possible bronchiectasis exacerbation -RVP negative, blood cultures negative to date -Continue Maretta Bees  and Brovana nebs.   -Pulmonology recommended to stop outpatient inhaled steroids in the setting of bronchiectasis       Acute urinary retention with significant bilateral hydronephrosis -Patient was found to have acute  kidney injury on CKD stage IV, UA negative for UTI - CT abdomen pelvis showed marked severity bilateral hydronephrosis, proximal hydroureter in the setting of markedly distended urinary bladder -Foley catheter was placed, continue Flomax -Discussed with urology on-call, Dr. Diona Fanti, on 3/8 recommended continue Foley catheter and Flomax.  Patient will need at least 2 weeks, recommended follow-up in the office for voiding trial -CT abdomen 3/10 showed decompression of the bladder and resolution of the hydronephrosis   Acute kidney injury on CKD stage IV with NAG metabolic acidosis -Follows nephrology at [facility], baseline creatinine around 2 (BUN 40, creatinine 2.1 per labs on 07/04/2021 - [facility]) -Creatinine 2.1 on 07/19/2021, nephrology was consulted due to creatinine worsening -Patient was placed on IV fluid hydration, increased rate on 3/9 and has been drinking plenty of fluids, creatinine improved to 2.4 (2.7 on 3/9) -IV fluids have been discontinued, patient is eating and drinking well, continue p.o. diet.     Acute on chronic anemia, likely due to CKD -No obvious bleeding, no hematuria, hematemesis, hematochezia or melena.  Possible worsening due to hemodilution, has received aggressive IV fluid hydration in the last 48 hours. --Received ESA on 3/9 -CT abdomen obtained, ruled out [facility], has diverticulosis. -Received 2 units packed RBC transfusion on 3/10, hemoglobin 10.9 at the time of discharge to CIR -Anemia panel consistent with anemia of chronic disease -Patient had a BM yesterday, did not notice any bleeding -Continue PPI     Hypothyroidism -Continue Synthroid     Moderate protein calorie malnutrition Estimated body mass index is 17.69 kg/m as calculated from the following:   Height as of 07/19/21: 5\' 3"  (1.6 m).   Weight as of 07/19/21: 45.3 kg.   Pain control - [employer] Controlled Substance Reporting System database was reviewed. and patient was instructed, not to drive,  operate heavy machinery, perform activities at heights, swimming or participation in water activities or provide baby-sitting services while on Pain, Sleep and Anxiety Medications; until their outpatient Physician has advised to do so again. Also recommended to not to take more than prescribed Pain, Sleep and Anxiety Medications.  Consultants: Neurology, nephrology, CCM Procedures performed: MRI brain Disposition: CIR Diet recommendation:  Cardiac diet DISCHARGE MEDICATION: Allergies as of 07/26/2021       Reactions   Ethambutol Other (See Comments)   Patient reports cramping   Iodinated Contrast Media Rash        Medication List     STOP taking these medications    Advair Diskus 250-50 MCG/ACT Aepb Generic drug: fluticasone-salmeterol   levofloxacin 500 MG/100ML Soln Commonly known as: LEVAQUIN   niCARdipine in saline 20-0.86 MG/200ML-% Soln Commonly known as: CARDENE-IV       TAKE these medications    albuterol (2.5 MG/3ML) 0.083% nebulizer solution Commonly known as: PROVENTIL Take 2.5 mg by nebulization every 6 (six) hours as needed for wheezing.   albuterol 108 (90 Base) MCG/ACT inhaler Commonly known as: VENTOLIN HFA Inhale 2 puffs into the lungs every 4 (four) hours as needed for wheezing.   amLODipine 2.5 MG tablet Commonly known as: NORVASC Take 1 tablet (2.5 mg total) by mouth daily.   arformoterol 15 MCG/2ML Nebu Commonly known as: BROVANA Take 2 mLs (15 mcg total) by nebulization 2 (two) times daily.   levofloxacin  500 MG tablet Commonly known as: LEVAQUIN Take 1 tablet (500 mg total) by mouth every other day. Start taking on: July 27, 2021   levothyroxine 75 MCG tablet Commonly known as: SYNTHROID Take 75 mcg by mouth daily.   pantoprazole 40 MG tablet Commonly known as: PROTONIX Take 1 tablet (40 mg total) by mouth daily. Start taking on: July 27, 2021   polyethylene glycol 17 g packet Commonly known as: MIRALAX / GLYCOLAX Take 17 g  by mouth daily. Start taking on: July 27, 2021   revefenacin 175 MCG/3ML nebulizer solution Commonly known as: YUPELRI Take 3 mLs (175 mcg total) by nebulization daily. Start taking on: July 27, 2021   senna-docusate 8.6-50 MG tablet Commonly known as: Senokot-S Take 1 tablet by mouth 2 (two) times daily.   sodium bicarbonate 650 MG tablet Take 1 tablet (650 mg total) by mouth 3 (three) times daily.   sodium chloride 0.65 % Soln nasal spray Commonly known as: OCEAN Place 1 spray into both nostrils as needed for congestion.   tamsulosin 0.4 MG Caps capsule Commonly known as: FLOMAX Take 1 capsule (0.4 mg total) by mouth daily. Start taking on: July 27, 2021        Follow-up Information     Garvin Fila, MD. Schedule an appointment as soon as possible for a visit in 1 month(s).   Specialties: Neurology, Radiology Why: stroke clinic Contact information: [address] [place] [address] [place]. Schedule an appointment as soon as possible for a visit in 2 week(s).   Why: for hospital follow-up Contact information: [place] [address] [phone]                Discharge Exam: S: Sitting up in the chair, with family members at the bedside.  Mental status alert and oriented, no acute issues overnight.  Very excited about going to the inpatient rehab today.  Vitals:   07/25/21 2127 07/25/21 2336 07/26/21 0348 07/26/21 0732  BP: (!) 154/86 (!) 172/104 (!) 148/89 (!) 162/84  Pulse: 90 100 94 92  Resp: 16 20 18 18   Temp: 97.7 F (36.5 C) 98 F (36.7 C) (!) 97.5 F (36.4 C) 98.1 F (36.7 C)  TempSrc: Oral Oral Axillary Oral  SpO2: 98% 96% 97% 96%    Physical Exam General: Alert and oriented x 3, NAD Cardiovascular: S1 S2 clear, RRR. No pedal edema b/l Respiratory: CTAB, no wheezing, rales or rhonchi Gastrointestinal: Soft, nontender, nondistended, NBS Ext: no pedal edema  bilaterally Neuro: no new deficits Psych: Normal affect and demeanor, alert and oriented x3    Condition at discharge: good  The results of significant diagnostics from this hospitalization (including imaging, microbiology, ancillary and laboratory) are listed below for reference.   Imaging Studies: CT ABDOMEN PELVIS WO CONTRAST  Result Date: 07/25/2021 CLINICAL DATA:  Abdominal pain, acute, nonlocalized anemia, unclear etiology, r/o RPH EXAM: CT ABDOMEN AND PELVIS WITHOUT CONTRAST TECHNIQUE: Multidetector CT imaging of the abdomen and pelvis was performed following the standard protocol without IV contrast. RADIATION DOSE REDUCTION: This exam was performed according to the departmental dose-optimization program which includes automated exposure control, adjustment of the mA and/or kV according to patient size and/or use of iterative reconstruction technique. COMPARISON:  CT 07/20/2021 FINDINGS: Lower chest: Large hiatal hernia. Parenchymal findings in the lung bases are similar to recent chest CT and consistent with patient's history of chronic  atypical mycobacterial infection. Small bilateral pleural effusions. Hepatobiliary: Unchanged hepatic cysts. The gallbladder is unremarkable. Pancreas: Unremarkable. No pancreatic ductal dilatation or surrounding inflammatory changes. Spleen: Normal in size.  Multiple calcified splenic granulomas. Adrenals/Urinary Tract: Adrenal glands are unremarkable. Interval placement of the Foley catheter with decompression of the bladder and renal collecting systems. Unchanged multiple bilateral renal cysts. Stomach/Bowel: Large hiatal hernia with intrathoracic stomach. There is no evidence of bowel obstruction.There is retained barium within the colon. The appendix is not definitively visualized. Extensive sigmoid diverticulosis without evidence of acute diverticulitis. Vascular/Lymphatic: Aortoiliac atherosclerotic calcifications. No AAA. No lymphadenopathy. Reproductive:  Unremarkable. Other: No free air. No abdominopelvic ascites. No evidence of retroperitoneal hematoma as clinically questioned. Musculoskeletal: No acute osseous abnormality. No suspicious lytic or blastic lesions. Multilevel degenerative changes of the spine. IMPRESSION: No acute abdominopelvic abnormality. Interval Foley catheter placement with decompression of the bladder and renal collecting systems. Colonic diverticulosis. Unchanged large hiatal hernia. Unchanged parenchymal findings the lung bases consistent with chronic atypical mycobacterial infection. Small bilateral pleural effusions. Electronically Signed   By: Maurine Simmering M.D.   On: 07/25/2021 10:56   CT ABDOMEN PELVIS WO CONTRAST  Result Date: 07/20/2021 CLINICAL DATA:  Altered mental status with fever, tachycardia and tachypnea. EXAM: CT ABDOMEN AND PELVIS WITHOUT CONTRAST TECHNIQUE: Multidetector CT imaging of the abdomen and pelvis was performed following the standard protocol without IV contrast. RADIATION DOSE REDUCTION: This exam was performed according to the departmental dose-optimization program which includes automated exposure control, adjustment of the mA and/or kV according to patient size and/or use of iterative reconstruction technique. COMPARISON:  None. FINDINGS: Lower chest: The study is markedly limited secondary to patient motion. Marked severity multifocal infiltrates and extensive bronchiectasis are seen throughout both lung bases. Hepatobiliary: 13 mm and 18 mm diameter cysts are seen within the anterior aspect of the right lobe of the liver. A 10 mm cystic appearing area is seen within the inferior aspect of the right lobe. There is mild central intrahepatic biliary dilatation. The gallbladder is mildly distended without evidence of gallstones, gallbladder wall thickening or pericholecystic fluid. Pancreas: Unremarkable. No pancreatic ductal dilatation or surrounding inflammatory changes. Spleen: Punctate calcified granulomas  are seen scattered throughout the parenchyma of a small spleen. Adrenals/Urinary Tract: Adrenal glands are unremarkable. Kidneys are normal in size. A 2.0 cm diameter cyst is seen along the posterior aspect of the mid left kidney. There is marked severity bilateral hydronephrosis and proximal hydroureter without obstructing renal calculi. Bilateral nonspecific perinephric inflammatory fat stranding is seen. The urinary bladder is markedly distended. Stomach/Bowel: There is a large hiatal hernia. Appendix appears normal. No evidence of bowel wall thickening, distention, or inflammatory changes. Noninflamed diverticula are seen throughout the large bowel. Vascular/Lymphatic: Mild aortic atherosclerosis. No enlarged abdominal or pelvic lymph nodes. Reproductive: Uterus and bilateral adnexa are unremarkable. Other: No abdominal wall hernia or abnormality. No abdominopelvic ascites. Musculoskeletal: Marked severity multilevel degenerative changes are seen throughout the lumbar spine. IMPRESSION: 1. Marked severity bibasilar multifocal infiltrates. 2. Marked severity bilateral hydronephrosis and proximal hydroureter in the setting of a markedly distended urinary bladder. 3. Large hiatal hernia. 4. Colonic diverticulosis. 5. Marked severity multilevel degenerative changes throughout the lumbar spine. 6. Aortic atherosclerosis. Aortic Atherosclerosis (ICD10-I70.0). Electronically Signed   By: Virgina Norfolk M.D.   On: 07/20/2021 02:02   CT HEAD WO CONTRAST (5MM)  Result Date: 07/25/2021 CLINICAL DATA:  Subarachnoid hemorrhage follow-up EXAM: CT HEAD WITHOUT CONTRAST TECHNIQUE: Contiguous axial images were obtained from the base of the  skull through the vertex without intravenous contrast. RADIATION DOSE REDUCTION: This exam was performed according to the departmental dose-optimization program which includes automated exposure control, adjustment of the mA and/or kV according to patient size and/or use of iterative  reconstruction technique. COMPARISON:  Three days ago FINDINGS: Brain: Unchanged shape and extent of parenchymal hemorrhage in the right temporal and parietal lobes with irregular shape limiting accurate measurement. Small volume overlying subarachnoid blood. Midline shift measures 3 mm. No entrapment or interval infarct Vascular: Atheromatous calcification Skull: Normal. Negative for fracture or focal lesion. Sinuses/Orbits: No acute finding. IMPRESSION: Unchanged right cerebral hemorrhage with adjacent edema. Midline shift measures 3 mm. Electronically Signed   By: Jorje Guild M.D.   On: 07/25/2021 11:28   CT HEAD WO CONTRAST  Result Date: 07/22/2021 CLINICAL DATA:  Intracranial hemorrhage. EXAM: CT HEAD WITHOUT CONTRAST TECHNIQUE: Contiguous axial images were obtained from the base of the skull through the vertex without intravenous contrast. RADIATION DOSE REDUCTION: This exam was performed according to the departmental dose-optimization program which includes automated exposure control, adjustment of the mA and/or kV according to patient size and/or use of iterative reconstruction technique. COMPARISON:  CT head without contrast 07/21/2021. MR head and MRA head 07/21/2021. FINDINGS: Brain: Hemorrhage involving the right parietal and temporal lobe is not significantly changed in size, measuring 4.0 x 3.2 x 4.4 cm. Blood products have begun to lay are. Surrounding edema and mass effect is similar the prior studies. Effacement of the sulci the posterior horn of the right lateral ventricle noted. Minimal intraventricular blood products are unchanged. Subarachnoid blood along the right temporal lobe is also stable. No new hemorrhage is present. One 2 mm midline shift is stable. No significant extra-axial fluid is present on the left hemisphere. The brainstem and cerebellum are within normal limits. Vascular: Atherosclerotic calcifications are present within the cavernous internal carotid arteries bilaterally.  No hyperdense vessel is present. Skull: Calvarium is intact. No focal lytic or blastic lesions are present. No significant extracranial soft tissue lesion is present. Sinuses/Orbits: Fluid is present inferior right mastoid air cells. Scratched at fluid is present the inferior mastoid air cells bilaterally. This is increased slightly. Obstructing lesions are present. The paranasal sinuses and mastoid air cells are otherwise clear. The globes and orbits are within normal limits. IMPRESSION: 1. Stable appearance of right parietal and temporal lobe hemorrhage with surrounding edema and mass effect. 2. Stable subarachnoid blood along the right temporal lobe. 3. Stable minimal intraventricular blood products. 4. No new hemorrhage. 5. Slight increase in bilateral mastoid effusions. Electronically Signed   By: San Morelle M.D.   On: 07/22/2021 15:01   CT HEAD WO CONTRAST (5MM)  Result Date: 07/21/2021 CLINICAL DATA:  Tension-type headache EXAM: CT HEAD WITHOUT CONTRAST TECHNIQUE: Contiguous axial images were obtained from the base of the skull through the vertex without intravenous contrast. RADIATION DOSE REDUCTION: This exam was performed according to the departmental dose-optimization program which includes automated exposure control, adjustment of the mA and/or kV according to patient size and/or use of iterative reconstruction technique. COMPARISON:  None FINDINGS: Brain: Generalized atrophy. Mild effacement of the occipital horn and atrium of the RIGHT lateral ventricle. Remaining ventricular system normal appearance. Large area of acute intraparenchymal hemorrhage identified within the RIGHT temporoparietal lobe 4.0 x 4.0 x 4.1 cm with surrounding vasogenic edema. Additionally, small amount of subarachnoid hemorrhage is seen within sulci at the anterior RIGHT temporal lobe. No intraventricular extension of hemorrhage. 3 mm of RIGHT to LEFT midline shift.  Underlying small vessel chronic ischemic changes of  deep cerebral white matter. No focal mass identified. Vascular: Atherosclerotic calcification of internal carotid arteries at skull base Skull: Intact Sinuses/Orbits: Clear Other: N/A IMPRESSION: Large area of acute intraparenchymal hemorrhage within the RIGHT temporoparietal lobe 4.0 x 4.0 x 4.1 cm with surrounding vasogenic edema and mild effacement of the occipital horn and atrium of the RIGHT lateral ventricle. Small amount of subarachnoid hemorrhage within sulci at the anterior RIGHT temporal lobe. 3 mm of RIGHT to LEFT midline shift. Underlying small vessel chronic ischemic changes of deep cerebral white matter. Critical Value/emergent results were called by telephone at the time of interpretation on 07/21/2021 at 12:56 pm to provider Laurey Arrow MD, who verbally acknowledged these results. Electronically Signed   By: Lavonia Dana M.D.   On: 07/21/2021 12:57   CT CHEST WO CONTRAST  Result Date: 07/20/2021 CLINICAL DATA:  81 year old female with history of community-acquired pneumonia. EXAM: CT CHEST WITHOUT CONTRAST TECHNIQUE: Multidetector CT imaging of the chest was performed following the standard protocol without IV contrast. RADIATION DOSE REDUCTION: This exam was performed according to the departmental dose-optimization program which includes automated exposure control, adjustment of the mA and/or kV according to patient size and/or use of iterative reconstruction technique. COMPARISON:  No priors. FINDINGS: Cardiovascular: Heart size is normal. There is no significant pericardial fluid, thickening or pericardial calcification. Aortic atherosclerosis. No coronary artery calcifications. Mediastinum/Nodes: No pathologically enlarged mediastinal or hilar lymph nodes. Please note that accurate exclusion of hilar adenopathy is limited on noncontrast CT scans. Large hiatal hernia. No axillary lymphadenopathy. Lungs/Pleura: Study is limited by considerable patient respiratory motion. With these limitations in  mind, there are widespread areas of cylindrical, varicose and occasional cystic bronchiectasis, with extensive thickening of the peribronchovascular interstitium, regional areas of architectural distortion, and extensive areas of peribronchovascular micro and macronodularity, most compatible with infected areas of mucoid impaction. No pleural effusions. Upper Abdomen: Aortic atherosclerosis. Musculoskeletal: There are no aggressive appearing lytic or blastic lesions noted in the visualized portions of the skeleton. IMPRESSION: 1. 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 Given the widespread micro and macronodularity, acute superinfection is not excluded. 2. Large hiatal hernia. 3. Aortic atherosclerosis. Aortic Atherosclerosis (ICD10-I70.0). Electronically Signed   By: Vinnie Langton M.D.   On: 07/20/2021 09:11   MR ANGIO HEAD WO CONTRAST  Result Date: 07/21/2021 CLINICAL DATA:  Initial evaluation for altered mental status. EXAM: MRI HEAD WITHOUT CONTRAST MRA HEAD WITHOUT CONTRAST TECHNIQUE: Multiplanar, multi-echo pulse sequences of the brain and surrounding structures were acquired without intravenous contrast. Angiographic images of the Circle of Willis were acquired using MRA technique without intravenous contrast. COMPARISON:  Prior CT from earlier the same day. FINDINGS: MRI HEAD FINDINGS Brain: Generalized age-related cerebral atrophy. Patchy and confluent T2/FLAIR hyperintensity involving the periventricular deep white matter both cerebral hemispheres as well as the pons, consistent with chronic small vessel ischemic disease, moderately advanced in nature. Small remote left cerebellar infarct noted. Previously identified intraparenchymal hematoma centered at the left temporal occipital region again seen. Hemorrhage measures approximately 3.7 x 3.7 x 3.7 cm (estimated volume 25 mL). No visible  underlying mass lesion or other structural abnormality on this noncontrast examination. Associated small volume subarachnoid hemorrhage within the adjacent right temporal region. Surrounding vasogenic edema with partial mass effect on the atrium of the right lateral ventricle and trace 3 mm right-to-left shift. No hydrocephalus or trapping. Basilar cisterns  remain patent. Trace intraventricular hemorrhage noted as well, likely related to redistribution. No other evidence for acute intracranial hemorrhage. No acute or subacute infarct elsewhere within the brain. Gray-white matter differentiation otherwise maintained. No other foci of susceptibility artifact to suggest acute or chronic intracranial blood products. No mass lesion or extra-axial fluid collection. Pituitary gland suprasellar region normal. Midline structures intact. Vascular: Major intracranial vascular flow voids are well maintained. Skull and upper cervical spine: Craniocervical junction within normal limits. Bone marrow signal intensity normal. No scalp soft tissue abnormality. Sinuses/Orbits: Globes orbital soft tissues within normal limits. Mild mucosal thickening noted within the ethmoidal air cells. Paranasal sinuses are otherwise clear. Small bilateral mastoid effusions noted. Visualized nasopharynx unremarkable. Other: None. MRA HEAD FINDINGS Anterior circulation: Examination degraded by motion artifact and positioning. Visualized distal cervical segments of the internal carotid arteries are patent with antegrade flow. Petrous segments patent bilaterally. Atheromatous irregularity seen within the carotid siphons without hemodynamically significant stenosis. Irregular 5 mm focal outpouching extending laterally from the cavernous right ICA consistent with a small aneurysm (series 19, image 57). A1 segments patent bilaterally. Normal anterior communicating artery complex. Both anterior cerebral arteries grossly patent to their distal aspects  without significant stenosis. No M1 stenosis or occlusion. Normal MCA bifurcations. Distal MCA branches well perfused and fairly symmetric. Posterior circulation: Visualized distal V4 segments patent without stenosis. Right vertebral artery slightly dominant. Basilar patent to its distal aspect without stenosis. Superior cerebellar arteries patent bilaterally. Both PCAs primarily supplied via the basilar well perfused or distal aspects. Anatomic variants: None significant. No vascular abnormality seen underlying the right cerebral hemorrhage. IMPRESSION: MRI HEAD IMPRESSION: 1. 25 mL intraparenchymal hematoma centered at the right temporoccipital region. No visible underlying mass lesion or other structural abnormality on this noncontrast examination. Associated vasogenic edema with trace 3 mm of right-to-left shift. 2. Associated small volume subarachnoid hemorrhage within the adjacent right cerebral hemisphere. Trace intraventricular hemorrhage likely related to redistribution. No hydrocephalus or trapping. 3. Underlying age-related cerebral atrophy with moderately advanced chronic microvascular ischemic disease. 4. Small remote left cerebellar infarct. MRA HEAD IMPRESSION: 1. No vascular abnormality seen underlying the right cerebral hemorrhage. Negative intracranial MRA 2. For large vessel occlusion. Mild atheromatous irregularity about the carotid siphons without hemodynamically significant or correctable stenosis. 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 Electronically Signed   By: Jeannine Boga M.D.   On: 07/21/2021 23:27   MR BRAIN WO CONTRAST  Result Date: 07/21/2021 CLINICAL DATA:  Initial evaluation for altered mental status. EXAM: MRI HEAD WITHOUT CONTRAST MRA HEAD WITHOUT CONTRAST TECHNIQUE: Multiplanar, multi-echo pulse sequences of the brain and surrounding structures were acquired without intravenous contrast. Angiographic images of the Circle of Willis were acquired  using MRA technique without intravenous contrast. COMPARISON:  Prior CT from earlier the same day. FINDINGS: MRI HEAD FINDINGS Brain: Generalized age-related cerebral atrophy. Patchy and confluent T2/FLAIR hyperintensity involving the periventricular deep white matter both cerebral hemispheres as well as the pons, consistent with chronic small vessel ischemic disease, moderately advanced in nature. Small remote left cerebellar infarct noted. Previously identified intraparenchymal hematoma centered at the left temporal occipital region again seen. Hemorrhage measures approximately 3.7 x 3.7 x 3.7 cm (estimated volume 25 mL). No visible underlying mass lesion or other structural abnormality on this noncontrast examination. Associated small volume subarachnoid hemorrhage within the adjacent right temporal region. Surrounding vasogenic edema with partial mass effect on the atrium of the right lateral ventricle and trace 3 mm right-to-left shift.  No hydrocephalus or trapping. Basilar cisterns remain patent. Trace intraventricular hemorrhage noted as well, likely related to redistribution. No other evidence for acute intracranial hemorrhage. No acute or subacute infarct elsewhere within the brain. Gray-white matter differentiation otherwise maintained. No other foci of susceptibility artifact to suggest acute or chronic intracranial blood products. No mass lesion or extra-axial fluid collection. Pituitary gland suprasellar region normal. Midline structures intact. Vascular: Major intracranial vascular flow voids are well maintained. Skull and upper cervical spine: Craniocervical junction within normal limits. Bone marrow signal intensity normal. No scalp soft tissue abnormality. Sinuses/Orbits: Globes orbital soft tissues within normal limits. Mild mucosal thickening noted within the ethmoidal air cells. Paranasal sinuses are otherwise clear. Small bilateral mastoid effusions noted. Visualized nasopharynx unremarkable.  Other: None. MRA HEAD FINDINGS Anterior circulation: Examination degraded by motion artifact and positioning. Visualized distal cervical segments of the internal carotid arteries are patent with antegrade flow. Petrous segments patent bilaterally. Atheromatous irregularity seen within the carotid siphons without hemodynamically significant stenosis. Irregular 5 mm focal outpouching extending laterally from the cavernous right ICA consistent with a small aneurysm (series 19, image 57). A1 segments patent bilaterally. Normal anterior communicating artery complex. Both anterior cerebral arteries grossly patent to their distal aspects without significant stenosis. No M1 stenosis or occlusion. Normal MCA bifurcations. Distal MCA branches well perfused and fairly symmetric. Posterior circulation: Visualized distal V4 segments patent without stenosis. Right vertebral artery slightly dominant. Basilar patent to its distal aspect without stenosis. Superior cerebellar arteries patent bilaterally. Both PCAs primarily supplied via the basilar well perfused or distal aspects. Anatomic variants: None significant. No vascular abnormality seen underlying the right cerebral hemorrhage. IMPRESSION: MRI HEAD IMPRESSION: 1. 25 mL intraparenchymal hematoma centered at the right temporoccipital region. No visible underlying mass lesion or other structural abnormality on this noncontrast examination. Associated vasogenic edema with trace 3 mm of right-to-left shift. 2. Associated small volume subarachnoid hemorrhage within the adjacent right cerebral hemisphere. Trace intraventricular hemorrhage likely related to redistribution. No hydrocephalus or trapping. 3. Underlying age-related cerebral atrophy with moderately advanced chronic microvascular ischemic disease. 4. Small remote left cerebellar infarct. MRA HEAD IMPRESSION: 1. No vascular abnormality seen underlying the right cerebral hemorrhage. Negative intracranial MRA 2. For large  vessel occlusion. Mild atheromatous irregularity about the carotid siphons without hemodynamically significant or correctable stenosis. 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 Electronically Signed   By: Jeannine Boga M.D.   On: 07/21/2021 23:27   US RENAL  Result Date: 07/24/2021 CLINICAL DATA:  Acute kidney injury. EXAM: RENAL / URINARY TRACT ULTRASOUND COMPLETE COMPARISON:  CT abdomen and pelvis dated July 20, 2021. FINDINGS: Right Kidney: Renal measurements: 10.2 x 4.4 x 3.7 cm = volume: 86 mL. Increased echogenicity. No mass or hydronephrosis visualized. Multiple cysts measuring up to 1.7 cm. Left Kidney: Renal measurements: 9.7 x 5.2 x 4.3 cm = volume: 113 mL. Increased echogenicity. No mass or hydronephrosis visualized. Multiple cysts measuring up to 1.6 cm. Bladder: Decompressed by Foley catheter. Other: None. IMPRESSION: 1. No acute abnormality. Resolved bilateral hydronephrosis after Foley catheter placement. 2. Increased bilateral renal echogenicity, consistent with medical renal disease. Electronically Signed   By: Titus Dubin M.D.   On: 07/24/2021 15:31   DG Chest Port 1 View  Result Date: 07/19/2021 CLINICAL DATA:  Suspected sepsis.  Altered mental status. EXAM: PORTABLE CHEST 1 VIEW COMPARISON:  None. FINDINGS: The heart is enlarged. There is a retrocardiac hiatal hernia. Patchy airspace opacities throughout the right lung and  at the left lung base. There is background interstitial thickening. No significant pleural effusion. No pneumothorax. The bones are under mineralized. No acute osseous findings. IMPRESSION: 1. Patchy airspace opacities throughout the right lung and at the left lung base. Differential considerations include multifocal pneumonia, including atypical organisms, or asymmetric pulmonary edema. 2. Cardiomegaly and hiatal hernia. Electronically Signed   By: Keith Rake M.D.   On: 07/19/2021 21:52   DG Swallowing Func-Speech  Pathology  Result Date: 07/24/2021 Table formatting from the original result was not included. Objective Swallowing Evaluation: Type of Study: MBS-Modified Barium Swallow Study  Patient Details Name: Lauren Conner MRN: [id number] Date of Birth: [birth year] Today's Date: 07/24/2021 Time: SLP Start Time (ACUTE ONLY): 60 -SLP Stop Time (ACUTE ONLY): 1247 SLP Time Calculation (min) (ACUTE ONLY): 17 min Past Medical History: No past medical history on file. Past Surgical History: No past surgical history on file. HPI: 81 yo F with end-stage renal failure with CKD, cardiac dysfunction with arrhythmia and SVT, hypothyroidism, moderate to severe protein calorie malnutrition, chronic bronchiectasis, She was admitted with altered mental status and confusion. She was found to have leukocytosis with elevated lactate and metabolic acidosis. Chest CT with cylindrical and saccular bronchiectatic changes with chronic bronchitic airways bilaterally with mucoid impaction and bibasilar fibrotic changes and large hiatal hernia. MRI 25 mL intraparenchymal hematoma centered at the right temporoccipital region. associated small volume subarachnoid hemorrhage within the adjacent right cerebral hemisphere, underlying age-related cerebral atrophy with moderately advanced  chronic microvascular ischemic disease, small remote left cerebellar infarct.  Subjective: Pt alert, confusion evident. Yelling for people who are not present in room. Eyes closed intermittently. Cleared with RN. Son present.  Recommendations for follow up therapy are one component of a multi-disciplinary discharge planning process, led by the attending physician.  Recommendations may be updated based on patient status, additional functional criteria and insurance authorization. Assessment / Plan / Recommendation Clinical Impressions 07/24/2021 Clinical Impression Pt was seen in radiology suite for modified barium swallow study. Trials of puree solids, regular texture solids, a  23mm barium tablet, and thin liquids via cup and straw were administered. Pt presented with mild pharyngoesophageal dysphagia characterized by reduction in pharyngeal constriction, and PES distension. A CP bar was intermittently noted and residue was noted at the level of the PES. Solid residue was cleared with a liquid wash and liquid residue was reduced with secondary swallows. It is recommended that the pt's current diet of regular solids and thin liquids be continued at this time. SLP will see patient once more for education; however, it is anticipated that further skilled SLP services for swallowing will likely not be needed beyond that. SLP Visit Diagnosis Dysphagia, pharyngoesophageal phase (R13.14) Attention and concentration deficit following -- Frontal lobe and executive function deficit following -- Impact on safety and function Mild aspiration risk   Treatment Recommendations 07/24/2021 Treatment Recommendations Therapy as outlined in treatment plan below   Prognosis 07/24/2021 Prognosis for Safe Diet Advancement Good Barriers to Reach Goals Cognitive deficits Barriers/Prognosis Comment -- Diet Recommendations 07/24/2021 SLP Diet Recommendations Regular solids;Thin liquid Liquid Administration via Cup;Straw Medication Administration Whole meds with liquid Compensations Small sips/bites;Slow rate Postural Changes Seated upright at 90 degrees   Other Recommendations 07/24/2021 Recommended Consults -- Oral Care Recommendations Oral care BID Other Recommendations -- Follow Up Recommendations Acute inpatient rehab (3hours/day) Assistance recommended at discharge Frequent or constant Supervision/Assistance Functional Status Assessment Patient has had a recent decline in their functional status and demonstrates the ability to make  significant improvements in function in a reasonable and predictable amount of time. Frequency and Duration  07/24/2021 Speech Therapy Frequency (ACUTE ONLY) min 1 x/week Treatment Duration 1  week   Oral Phase 07/24/2021 Oral Phase WFL Oral - Pudding Teaspoon -- Oral - Pudding Cup -- Oral - Honey Teaspoon -- Oral - Honey Cup -- Oral - Nectar Teaspoon -- Oral - Nectar Cup -- Oral - Nectar Straw -- Oral - Thin Teaspoon -- Oral - Thin Cup -- Oral - Thin Straw -- Oral - Puree -- Oral - Mech Soft -- Oral - Regular -- Oral - Multi-Consistency -- Oral - Pill -- Oral Phase - Comment --  Pharyngeal Phase 07/24/2021 Pharyngeal Phase WFL Pharyngeal- Pudding Teaspoon -- Pharyngeal -- Pharyngeal- Pudding Cup -- Pharyngeal -- Pharyngeal- Honey Teaspoon -- Pharyngeal -- Pharyngeal- Honey Cup -- Pharyngeal -- Pharyngeal- Nectar Teaspoon -- Pharyngeal -- Pharyngeal- Nectar Cup -- Pharyngeal -- Pharyngeal- Nectar Straw -- Pharyngeal -- Pharyngeal- Thin Teaspoon -- Pharyngeal -- Pharyngeal- Thin Cup Pharyngeal residue - cp segment;Reduced pharyngeal peristalsis Pharyngeal -- Pharyngeal- Thin Straw Pharyngeal residue - cp segment;Reduced pharyngeal peristalsis Pharyngeal -- Pharyngeal- Puree Pharyngeal residue - cp segment;Reduced pharyngeal peristalsis Pharyngeal -- Pharyngeal- Mechanical Soft -- Pharyngeal -- Pharyngeal- Regular -- Pharyngeal -- Pharyngeal- Multi-consistency -- Pharyngeal -- Pharyngeal- Pill -- Pharyngeal -- Pharyngeal Comment --  Cervical Esophageal Phase  07/24/2021 Cervical Esophageal Phase Impaired Pudding Teaspoon -- Pudding Cup -- Honey Teaspoon -- Honey Cup -- Nectar Teaspoon -- Nectar Cup -- Nectar Straw -- Thin Teaspoon -- Thin Cup Prominent cricopharyngeal segment;Reduced cricopharyngeal relaxation Thin Straw Prominent cricopharyngeal segment;Reduced cricopharyngeal relaxation Puree -- Mechanical Soft -- Regular -- Multi-consistency -- Pill -- Cervical Esophageal Comment -- Tobie Poet I. Hardin Negus, [place], [place] Office number [phone] Pager [phone] Horton Marshall 07/24/2021, 2:07 PM                     ECHOCARDIOGRAM COMPLETE  Result Date: 07/22/2021     ECHOCARDIOGRAM REPORT   Patient Name:   Lauren Conner Date of Exam: 07/22/2021 Medical Rec #:  [id number]   Height:       63.0 in Accession #:    [id number]  Weight:       99.9 lb Date of Birth:  11/23/1940   BSA:          1.439 m Patient Age:    31 years    BP:           121/63 mmHg Patient Gender: F           HR:           97 bpm. Exam Location:  Inpatient Procedure: 2D Echo, Cardiac Doppler and Color Doppler Indications:    Stroke  History:        Patient has no prior history of Echocardiogram examinations.                 Risk Factors:Hypertension.  Sonographer:    Jyl Heinz Referring Phys: [id number] [place]  1. Left ventricular ejection fraction, by estimation, is 65 to 70%. Left ventricular ejection fraction by 2D MOD biplane is 65.2 %. The left ventricle has normal function. The left ventricle has no regional wall motion abnormalities. Left ventricular diastolic parameters are consistent with Grade I diastolic dysfunction (impaired relaxation).  2. Right ventricular systolic function is low normal. The right ventricular size is normal. There is severely elevated pulmonary artery systolic pressure. The estimated right ventricular systolic pressure  is 69.2 mmHg.  3. The mitral valve is grossly normal. Trivial mitral valve regurgitation.  4. The aortic valve is tricuspid. Aortic valve regurgitation is trivial. Aortic valve sclerosis is present, with no evidence of aortic valve stenosis. Aortic regurgitation PHT measures 509 msec.  5. The inferior vena cava is dilated in size with >50% respiratory variability, suggesting right atrial pressure of 8 mmHg. Comparison(s): No prior Echocardiogram. FINDINGS  Left Ventricle: Left ventricular ejection fraction, by estimation, is 65 to 70%. Left ventricular ejection fraction by 2D MOD biplane is 65.2 %. The left ventricle has normal function. The left ventricle has no regional wall motion abnormalities. The left ventricular internal cavity size was normal in  size. There is no left ventricular hypertrophy. Left ventricular diastolic parameters are consistent with Grade I diastolic dysfunction (impaired relaxation). Indeterminate filling pressures. Right Ventricle: The right ventricular size is normal. No increase in right ventricular wall thickness. Right ventricular systolic function is low normal. There is severely elevated pulmonary artery systolic pressure. The tricuspid regurgitant velocity is 3.91 m/s, and with an assumed right atrial pressure of 8 mmHg, the estimated right ventricular systolic pressure is 65.0 mmHg. Left Atrium: Left atrial size was normal in size. Right Atrium: Right atrial size was normal in size. Pericardium: There is no evidence of pericardial effusion. Mitral Valve: The mitral valve is grossly normal. Trivial mitral valve regurgitation. Tricuspid Valve: The tricuspid valve is grossly normal. Tricuspid valve regurgitation is trivial. Aortic Valve: The aortic valve is tricuspid. Aortic valve regurgitation is trivial. Aortic regurgitation PHT measures 509 msec. Aortic valve sclerosis is present, with no evidence of aortic valve stenosis. Aortic valve peak gradient measures 7.6 mmHg. Pulmonic Valve: The pulmonic valve was grossly normal. Pulmonic valve regurgitation is not visualized. Aorta: The aortic root and ascending aorta are structurally normal, with no evidence of dilitation. Venous: The inferior vena cava is dilated in size with greater than 50% respiratory variability, suggesting right atrial pressure of 8 mmHg. IAS/Shunts: No atrial level shunt detected by color flow Doppler.  LEFT VENTRICLE PLAX 2D                        Biplane EF (MOD) LVIDd:         4.00 cm         LV Biplane EF:   Left LVIDs:         2.50 cm                          ventricular LV PW:         1.00 cm                          ejection LV IVS:        1.00 cm                          fraction by LVOT diam:     2.00 cm                          2D MOD LV SV:         67                                biplane is LV SV Index:  47                               65.2 %. LVOT Area:     3.14 cm                                Diastology                                LV e' medial:    5.22 cm/s LV Volumes (MOD)               LV E/e' medial:  12.0 LV vol d, MOD    59.4 ml       LV e' lateral:   6.64 cm/s A2C:                           LV E/e' lateral: 9.4 LV vol d, MOD    60.8 ml A4C: LV vol s, MOD    20.3 ml A2C: LV vol s, MOD    20.7 ml A4C: LV SV MOD A2C:   39.1 ml LV SV MOD A4C:   60.8 ml LV SV MOD BP:    39.4 ml RIGHT VENTRICLE             IVC RV Basal diam:  3.30 cm     IVC diam: 2.00 cm RV Mid diam:    3.10 cm RV S prime:     10.40 cm/s TAPSE (M-mode): 2.3 cm LEFT ATRIUM             Index        RIGHT ATRIUM           Index LA diam:        3.80 cm 2.64 cm/m   RA Area:     17.20 cm LA Vol (A2C):   27.3 ml 18.97 ml/m  RA Volume:   45.10 ml  31.33 ml/m LA Vol (A4C):   44.1 ml 30.64 ml/m LA Biplane Vol: 36.8 ml 25.57 ml/m  AORTIC VALVE AV Area (Vmax): 2.62 cm AV Vmax:        138.00 cm/s AV Peak Grad:   7.6 mmHg LVOT Vmax:      115.00 cm/s LVOT Vmean:     79.400 cm/s LVOT VTI:       0.214 m AI PHT:         509 msec  AORTA Ao Root diam: 3.20 cm Ao Asc diam:  3.00 cm MITRAL VALVE               TRICUSPID VALVE MV Area (PHT): 3.68 cm    TR Peak grad:   61.2 mmHg MV Decel Time: 206 msec    TR Vmax:        391.00 cm/s MV E velocity: 62.60 cm/s MV A velocity: 83.10 cm/s  SHUNTS MV E/A ratio:  0.75        Systemic VTI:  0.21 m                            Systemic Diam: 2.00 cm Lyman Bishop MD Electronically signed by Lyman Bishop MD Signature Date/Time: 07/22/2021/10:47:58 AM    Final     Microbiology: Results for orders  placed or performed during the hospital encounter of 07/21/21  MRSA Next Gen by PCR, Nasal     Status: None   Collection Time: 07/21/21  5:44 PM   Specimen: Nasal Mucosa; Nasal Swab  Result Value Ref Range Status   MRSA by PCR Next Gen NOT DETECTED NOT DETECTED  Final    Comment: (NOTE) The GeneXpert MRSA Assay (FDA approved for NASAL specimens only), is one component of a comprehensive MRSA colonization surveillance program. It is not intended to diagnose MRSA infection nor to guide or monitor treatment for MRSA infections. Test performance is not FDA approved in patients less than 40 years old. Performed at [facility], [place] [address], [place], [place] [address]     Labs: CBC: Recent Labs  Lab 07/22/21 0424 07/23/21 0119 07/24/21 0043 07/25/21 0553 07/25/21 2140 07/26/21 0153  WBC 8.4 9.3 6.5 6.2  --  7.5  HGB 7.9* 9.0* 7.1* 6.9* 11.0* 10.9*  HCT 24.0* 27.8* 22.2* 20.8* 31.7* 31.8*  MCV 94.9 96.2 96.9 95.9  --  90.6  PLT 193 241 203 203  --  196   Basic Metabolic Panel: Recent Labs  Lab 07/22/21 0424 07/23/21 0119 07/24/21 0043 07/25/21 0553 07/26/21 0153  NA 132* 133* 134* 136 136  K 4.3 4.0 3.8 3.9 4.2  CL 105 105 108 112* 110  CO2 18* 18* 18* 18* 19*  GLUCOSE 51* 95 109* 80 82  BUN 46* 42* 40* 35* 32*  CREATININE 2.54* 2.59* 2.78* 2.44* 2.47*  CALCIUM 7.9* 8.2* 7.5* 7.5* 8.1*  MG 1.7  --   --  1.9  --   PHOS 4.0  --   --   --   --    Liver Function Tests: Recent Labs  Lab 07/19/21 2200 07/21/21 0414  AST 35 20  ALT 15 12  ALKPHOS 68 53  BILITOT 0.7 0.6  PROT 7.9 6.0*  ALBUMIN 3.1* 2.4*   CBG: Recent Labs  Lab 07/22/21 0746 07/22/21 0836 07/22/21 1146 07/23/21 0118  GLUCAP 44* 111* 77 96    Discharge time spent: greater than 30 minutes.  Signed: Estill Cotta, MD [facility] 07/26/2021

## 2021-07-26 NOTE — Progress Notes (Signed)
1400Patient discharged to CIR. Report called to [facility] LPN. Patient's family made aware of discharge. All of patient's belongings sent with patient. ?

## 2021-07-27 MED ORDER — CHLORHEXIDINE GLUCONATE CLOTH 2 % EX PADS
6.0000 | MEDICATED_PAD | Freq: Every day | CUTANEOUS | Status: DC
  Administered 2021-07-27 – 2021-07-30 (×4): 6 via TOPICAL

## 2021-07-27 NOTE — Progress Notes (Signed)
[facility] ROUNDING NOTE   Subjective:   Interval History: 81 year old lady with history of hypertension bronchiectasis hypothyroidism CKD with unknown etiology followed at [facility] with baseline serum creatinine 2 mg/dL.  She admitted with acute on chronic kidney insufficiency.  But her renal function appears to return to baseline.  Blood pressure 159/63 p 100 t 98.7  sats 97 % RA  Sodium 136 potassium 4.2 chloride 110 CO2 19 BUN 32 creatinine 2.47 glucose 82 hemoglobin   10.9    Objective:  Vital signs in last 24 hours:  Temp:  [97.6 F (36.4 C)-98.7 F (37.1 C)] 98.7 F (37.1 C) (03/12 0426) Pulse Rate:  [83-100] 100 (03/12 0426) Resp:  [14-18] 14 (03/12 0426) BP: (148-192)/(78-95) 159/93 (03/12 0426) SpO2:  [95 %-98 %] 97 % (03/12 0820) Weight:  [55.6 kg] 55.6 kg (03/11 1546)  Weight change:  Filed Weights   07/26/21 1433 07/26/21 1546  Weight: 55.6 kg 55.6 kg    Intake/Output: I/O last 3 completed shifts: In: 120 [P.O.:120] Out: 1500 [Urine:1500]   Intake/Output this shift:  Total I/O In: 120 [P.O.:120] Out: -   CVS- RRR no murmurs rubs or gallops RS- CTA no wheezes or rales ABD- BS present soft non-distended EXT- no edema   Basic Metabolic Panel: Recent Labs  Lab 07/22/21 0424 07/23/21 0119 07/24/21 0043 07/25/21 0553 07/26/21 0153  NA 132* 133* 134* 136 136  K 4.3 4.0 3.8 3.9 4.2  CL 105 105 108 112* 110  CO2 18* 18* 18* 18* 19*  GLUCOSE 51* 95 109* 80 82  BUN 46* 42* 40* 35* 32*  CREATININE 2.54* 2.59* 2.78* 2.44* 2.47*  CALCIUM 7.9* 8.2* 7.5* 7.5* 8.1*  MG 1.7  --   --  1.9  --   PHOS 4.0  --   --   --   --      Liver Function Tests: Recent Labs  Lab 07/21/21 0414  AST 20  ALT 12  ALKPHOS 53  BILITOT 0.6  PROT 6.0*  ALBUMIN 2.4*    No results for input(s): LIPASE, AMYLASE in the last 168 hours. No results for input(s): AMMONIA in the last 168 hours.  CBC: Recent Labs  Lab 07/22/21 0424 07/23/21 0119  07/24/21 0043 07/25/21 0553 07/25/21 2140 07/26/21 0153  WBC 8.4 9.3 6.5 6.2  --  7.5  HGB 7.9* 9.0* 7.1* 6.9* 11.0* 10.9*  HCT 24.0* 27.8* 22.2* 20.8* 31.7* 31.8*  MCV 94.9 96.2 96.9 95.9  --  90.6  PLT 193 241 203 203  --  226     Cardiac Enzymes: No results for input(s): CKTOTAL, CKMB, CKMBINDEX, TROPONINI in the last 168 hours.  BNP: Invalid input(s): POCBNP  CBG: Recent Labs  Lab 07/22/21 0746 07/22/21 0836 07/22/21 1146 07/23/21 0118  GLUCAP 44* 111* 47 96     Microbiology: Results for orders placed or performed during the hospital encounter of 07/21/21  MRSA Next Gen by PCR, Nasal     Status: None   Collection Time: 07/21/21  5:44 PM   Specimen: Nasal Mucosa; Nasal Swab  Result Value Ref Range Status   MRSA by PCR Next Gen NOT DETECTED NOT DETECTED Final    Comment: (NOTE) The GeneXpert MRSA Assay (FDA approved for NASAL specimens only), is one component of a comprehensive MRSA colonization surveillance program. It is not intended to diagnose MRSA infection nor to guide or monitor treatment for MRSA infections. Test performance is not FDA approved in patients less than 66 years old.  Performed at [facility], [place] [address], [place], [place] [address]     Coagulation Studies: No results for input(s): LABPROT, INR in the last 72 hours.  Urinalysis: Recent Labs    07/24/21 1346  COLORURINE STRAW*  LABSPEC 1.005  PHURINE 5.0  GLUCOSEU NEGATIVE  HGBUR SMALL*  BILIRUBINUR NEGATIVE  KETONESUR NEGATIVE  PROTEINUR 100*  NITRITE NEGATIVE  LEUKOCYTESUR NEGATIVE       Imaging: CT ABDOMEN PELVIS WO CONTRAST  Result Date: 07/25/2021 CLINICAL DATA:  Abdominal pain, acute, nonlocalized anemia, unclear etiology, r/o RPH EXAM: CT ABDOMEN AND PELVIS WITHOUT CONTRAST TECHNIQUE: Multidetector CT imaging of the abdomen and pelvis was performed following the standard protocol without IV contrast. RADIATION DOSE REDUCTION: This exam was performed  according to the departmental dose-optimization program which includes automated exposure control, adjustment of the mA and/or kV according to patient size and/or use of iterative reconstruction technique. COMPARISON:  CT 07/20/2021 FINDINGS: Lower chest: Large hiatal hernia. 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 Small bilateral pleural effusions. Hepatobiliary: Unchanged hepatic cysts. The gallbladder is unremarkable. Pancreas: Unremarkable. No pancreatic ductal dilatation or surrounding inflammatory changes. Spleen: Normal in size.  Multiple calcified splenic granulomas. Adrenals/Urinary Tract: Adrenal glands are unremarkable. Interval placement of the Foley catheter with decompression of the bladder and renal collecting systems. Unchanged multiple bilateral renal cysts. Stomach/Bowel: Large hiatal hernia with intrathoracic stomach. There is no evidence of bowel obstruction.There is retained barium within the colon. The appendix is not definitively visualized. Extensive sigmoid diverticulosis without evidence of acute diverticulitis. Vascular/Lymphatic: Aortoiliac atherosclerotic calcifications. No AAA. No lymphadenopathy. Reproductive: Unremarkable. Other: No free air. No abdominopelvic ascites. No evidence of retroperitoneal hematoma as clinically questioned. Musculoskeletal: No acute osseous abnormality. No suspicious lytic or blastic lesions. Multilevel degenerative changes of the spine. IMPRESSION: No acute abdominopelvic abnormality. Interval Foley catheter placement with decompression of the bladder and renal collecting systems. Colonic diverticulosis. Unchanged large hiatal hernia. Unchanged parenchymal findings the lung bases consistent with chronic atypical mycobacterial infection. Small bilateral pleural effusions. Electronically Signed   By: Maurine Simmering M.D.   On: 07/25/2021 10:56   CT  HEAD WO CONTRAST (5MM)  Result Date: 07/25/2021 CLINICAL DATA:  Subarachnoid hemorrhage follow-up EXAM: CT HEAD WITHOUT CONTRAST TECHNIQUE: Contiguous axial images were obtained from the base of the skull through the vertex without intravenous contrast. RADIATION DOSE REDUCTION: This exam was performed according to the departmental dose-optimization program which includes automated exposure control, adjustment of the mA and/or kV according to patient size and/or use of iterative reconstruction technique. COMPARISON:  Three days ago FINDINGS: Brain: Unchanged shape and extent of parenchymal hemorrhage in the right temporal and parietal lobes with irregular shape limiting accurate measurement. Small volume overlying subarachnoid blood. Midline shift measures 3 mm. No entrapment or interval infarct Vascular: Atheromatous calcification Skull: Normal. Negative for fracture or focal lesion. Sinuses/Orbits: No acute finding. IMPRESSION: Unchanged right cerebral hemorrhage with adjacent edema. Midline shift measures 3 mm. Electronically Signed   By: Jorje Guild M.D.   On: 07/25/2021 11:28     Medications:     amLODipine  2.5 mg Oral Daily   arformoterol  15 mcg Nebulization BID   levofloxacin  500 mg Oral Q48H   levothyroxine  75 mcg Oral Q0600   pantoprazole  40 mg Oral Daily   polyethylene glycol  17 g Oral Daily   revefenacin  175 mcg Nebulization Daily  senna-docusate  1 tablet Oral BID   sodium bicarbonate  650 mg Oral TID   tamsulosin  0.4 mg Oral Daily   acetaminophen **OR** acetaminophen (TYLENOL) oral liquid 160 mg/5 mL **OR** acetaminophen, sodium chloride  Assessment/ Plan:  Acute on chronic renal sufficiency based on serum creatinine at 2 mg/dL.  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 Hypertension/volume appears euvolemic blood pressures were a little low they have returned.  Restarting Norvasc would be reasonable I would use a low-dose of 2.5  mg daily .  Metabolic acidosis continue sodium bicarbonate 3 times daily Anemia does not appear to be an issue at this point.  Status post transfusion  Will sign off unless there are further questions  LOS: [place] @TODAY @10 :31 AM

## 2021-07-27 NOTE — Discharge Instructions (Signed)
Inpatient Rehab Discharge Instructions  Lauren Conner Discharge date and time: No discharge date for patient encounter.   Activities/Precautions/ Functional Status: Activity: As tolerated Diet: Regular Wound Care: Routine skin checks Functional status:  ___ No restrictions     ___ Walk up steps independently ___ 24/7 supervision/assistance   ___ Walk up steps with assistance ___ Intermittent supervision/assistance  ___ Bathe/dress independently ___ Walk with walker     __x_ Bathe/dress with assistance ___ Walk Independently    ___ Shower independently ___ Walk with assistance    ___ Shower with assistance ___ No alcohol     ___ Return to work/school ________  Special Instructions: No driving smoking or alcohol  Follow-up with urology service Dr. Diona Fanti for voiding trial   My questions have been answered and I understand these instructions. I will adhere to these goals and the provided educational materials after my discharge from the hospital.  Patient/Caregiver Signature _______________________________ Date __________  Clinician Signature _______________________________________ Date __________  Please bring this form and your medication list with you to all your follow-up doctor's appointments.

## 2021-07-27 NOTE — Evaluation (Signed)
Speech Language Pathology Assessment and Plan  Patient Details  Name: Lauren Conner MRN: [id number] Date of Birth: [birth year]  SLP Diagnosis: Cognitive Impairments  Rehab Potential: Good ELOS: 5-7 days    Today's Date: 07/27/2021 SLP Individual Time: 1300-1400 SLP Individual Time Calculation (min): 60 min   Hospital Problem: Principal Problem:   Intraparenchymal hemorrhage of brain [facility])  Past Medical History: History reviewed. No pertinent past medical history. Past Surgical History: History reviewed. No pertinent surgical history.  Assessment / Plan / Recommendation Clinical Impression  Lauren Conner is a 81 year old right-handed Conner with history of anemia of chronic disease paroxysmal SVT, hypothyroidism, hypertension, CKD stage IV followed by Dr.Lehrich with nephrology at [facility] with creatinine baseline 2.0.  Per chart review patient lives alone.  1 level home 2 steps to enter.  Independent and active prior to admission.  Son and daughter-in-law are planning on moving in with her after discharge to provide assistance as needed.  [facility] to [facility] 07/19/2021 after being found down on her left side with altered mental status and left-sided weakness.  Patient was tachycardic 124 with low-grade fever of 100.5 and blood pressure 154/121, oxygen saturations 100% room air.  Cranial CT scan showed large area of acute intraparenchymal hemorrhage within the right temporal parietal lobe 4.0 x 4.0 x 4.1 cm with surrounding vasogenic edema and mild effacement of the occipital horn and atrium of the right lateral ventricle.  Small amount of subarachnoid hemorrhage within [place] at the anterior right temporal lobe.  3 mm of right to left midline shift.  Admission chemistry sodium 126 chloride 97 glucose 157 BUN 41 creatinine 2.13, WBC 17,100, hemoglobin 10.2, lactic acid 3.5 troponin 55-33, blood cultures no growth to date, urinalysis negative nitrite, procalcitonin 0.32, COVID-19 and influenza testing negative.   CT of the chest without contrast showed significant motion artifact bilaterally however notable cylindrical and saccular bronchiectatic changes with chronic bronchitic airway bilaterally.  CT of chest abdomen pelvis showed bilateral hydronephrosis and proximal hydroureter.  MRI/MRA of the head 25 mL intraparenchymal hematoma centered at the right temporal occipital region.  No vascular abnormality seen underlying the right cerebral hemorrhage.  Echocardiogram with ejection fraction of 65 to 70% no wall motion abnormalities grade 1 diastolic dysfunction.  Patient was placed on broad-spectrum intravenous antibiotic for suspected sepsis.  Urology services Dr. Diona Fanti consulted in regards to urinary retention/significant bilateral hydronephrosis recommended Foley catheter tube and Flomax x2 weeks and follow-up outpatient.  A follow-up renal ultrasound 07/24/2021 revealed no acute abnormality resolved bilateral hydronephrosis.  Renal services Dr. Moshe Cipro consulted in regards to longstanding CKD stage IV with baseline creatinine 2.0 elevating to 2.78 felt to be related to BP variability going from very high to very low normal.  Amlodipine and metoprolol discontinued and lattest creatinine 2.44.  Her antibiotic regimen has been transition to Levaquin 500 mg every 48 hours 07/23/2021 per PCCM x14 days.  RVP negative, blood cultures negative to date.  Anemia of chronic disease A's hemoglobin 6.9 awaiting plan transfusion.  She is tolerating a regular consistency diet.  Therapy evaluations completed due to patient decreased functional mobility left side weakness was admitted for a comprehensive rehab program. Currently complains of weakness.  SLP evaluation completed on 07/27/2021 with results as follows:  Pt presents with no overt s/s of aspiration when consuming presentations of her currently prescribed diet.  Her oral phase is efficient and timely for containing and clearing boluses.  She presents with no oral residue  post swallow and no s/s  of esophageal dysphagia were evident with any consistencies assessed.  As a result, I recommend that pt remain on regular textures and thin liquids.  No further ST needs are indicated for dysphagia at this time.    Pt presents with mild higher level cognitive deficits as verified by scores falling within the range of mild-moderate impairment on the attention, visuospatial constructional skills, and calculations subtests of the Cognistat standardized cognitive assessment.  Deficits are characterized by decreased retrieval of information, decreased functional problem solving, and decreased working memory.  Pt's speech is fluent and free from dysarthria or obvious word finding impairment.    Given the abovementioned deficits pt would benefit from skilled ST while inpatient in order to maximize functional independence and reduce burden of care prior to discharge.  Anticipate that pt will need ST follow up at next level of care and at least intermittent assistance for higher level tasks such as medication and financial management.     Skilled Therapeutic Interventions          Cognitive-linguistic evaluation completed with results and recommendations reviewed with patient    SLP Assessment  Patient will need skilled Speech Lanaguage Pathology Services during CIR admission    Recommendations  SLP Diet Recommendations: Age appropriate regular solids;Thin Liquid Administration via: Cup;Straw Medication Administration: Whole meds with liquid Supervision: Patient able to self feed Compensations: Small sips/bites;Slow rate Postural Changes and/or Swallow Maneuvers: Seated upright 90 degrees Oral Care Recommendations: Oral care BID Patient destination: Home Follow up Recommendations: Outpatient SLP Equipment Recommended: None recommended by SLP    SLP Frequency 3 to 5 out of 7 days   SLP Duration  SLP Intensity  SLP Treatment/Interventions 5-7 days  Minumum of 1-2 x/day, 30  to 90 minutes  Cognitive remediation/compensation;Cueing hierarchy;Environmental controls;Functional tasks;Internal/external aids;Patient/family education    Pain Pain Assessment Pain Scale: 0-10 Pain Score: 0-No pain Pain Type: Acute pain (patient indicated she had a head ache) Pain Location: Head Pain Descriptors / Indicators: Dull;Headache Pain Onset: On-going Patients Stated Pain Goal: 1  Prior Functioning Cognitive/Linguistic Baseline: Within functional limits Type of Home: House  Lives With: Alone Available Help at Discharge: Family;Available 24 hours/day Vocation: Retired  Programmer, systems Overall Cognitive Status: Impaired/Different from baseline Arousal/Alertness: Awake/alert Orientation Level: Oriented X4 Year: 2023 Month: March Day of Week: Correct Attention: Sustained Sustained Attention: Appears intact Memory: Impaired Memory Impairment: Decreased recall of new information;Retrieval deficit Decreased Short Term Memory: Verbal basic;Functional basic Immediate Memory Recall: Sock;Blue;Bed Memory Recall Sock: Without Cue Memory Recall Blue: Without Cue Memory Recall Bed: Without Cue Awareness: Appears intact Problem Solving: Impaired Problem Solving Impairment: Functional complex Safety/Judgment: Appears intact [employer] Scales of Cognitive Functioning: Purposeful/appropriate  Comprehension Auditory Comprehension Overall Auditory Comprehension: Appears within functional limits for tasks assessed Expression Expression Primary Mode of Expression: Verbal Verbal Expression Overall Verbal Expression: Appears within functional limits for tasks assessed Written Expression Dominant Hand: Right Oral Motor Oral Motor/Sensory Function Overall Oral Motor/Sensory Function: Within functional limits  Care Tool Care Tool Cognition Ability to hear (with hearing aid or hearing appliances if normally used Ability to hear (with hearing aid or hearing  appliances if normally used): 0. Adequate - no difficulty in normal conservation, social interaction, listening to TV   Expression of Ideas and Wants Expression of Ideas and Wants: 4. Without difficulty (complex and basic) - expresses complex messages without difficulty and with speech that is clear and easy to understand   Understanding Verbal and Non-Verbal Content Understanding Verbal and Non-Verbal  Content: 4. Understands (complex and basic) - clear comprehension without cues or repetitions  Memory/Recall Ability Memory/Recall Ability : Current season;Location of own room;Staff names and faces;That he or she is in a hospital/hospital unit   Bedside Swallowing Assessment General Date of Onset: 07/21/21 Previous Swallow Assessment: mbs 3/9 Diet Prior to this Study: Regular;Thin liquids Temperature Spikes Noted: No Respiratory Status: Room air History of Recent Intubation: No Behavior/Cognition: Alert;Cooperative;Pleasant mood Oral Cavity - Dentition: Adequate natural dentition Self-Feeding Abilities: Able to feed self Vision: Functional for self-feeding Patient Positioning: Upright in chair/Tumbleform Baseline Vocal Quality: Normal Volitional Cough: Strong  Oral Care Assessment   Ice Chips   Thin Liquid Thin Liquid: Within functional limits Nectar Thick   Honey Thick   Puree Puree: Within functional limits Solid Solid: Within functional limits BSE Assessment Risk for Aspiration Impact on safety and function: Mild aspiration risk Other Related Risk Factors: History of esophageal-related issues;Cognitive impairment  Short Term Goals: Week 1: SLP Short Term Goal 1 (Week 1): STG=LTG due to ELOS  Refer to Care Plan for Long Term Goals  Recommendations for other services: None   Discharge Criteria: Patient will be discharged from SLP if patient refuses treatment 3 consecutive times without medical reason, if treatment goals not met, if there is a change in medical status,  if patient makes no progress towards goals or if patient is discharged from hospital.  The above assessment, treatment plan, treatment alternatives and goals were discussed and mutually agreed upon: by patient  Lauren Conner 07/27/2021, 3:57 PM

## 2021-07-27 NOTE — Plan of Care (Signed)
?  Problem: RH Balance ?Goal: LTG Patient will maintain dynamic sitting balance (PT) ?Description: LTG:  Patient will maintain dynamic sitting balance with assistance during mobility activities (PT) ?Flowsheets (Taken 07/27/2021 1219) ?LTG: Pt will maintain dynamic sitting balance during mobility activities with:: Independent ?Goal: LTG Patient will maintain dynamic standing balance (PT) ?Description: LTG:  Patient will maintain dynamic standing balance with assistance during mobility activities (PT) ?Flowsheets (Taken 07/27/2021 1219) ?LTG: Pt will maintain dynamic standing balance during mobility activities with:: Independent with assistive device  ?  ?Problem: Sit to Stand ?Goal: LTG:  Patient will perform sit to stand with assistance level (PT) ?Description: LTG:  Patient will perform sit to stand with assistance level (PT) ?Flowsheets (Taken 07/27/2021 1219) ?LTG: PT will perform sit to stand in preparation for functional mobility with assistance level: Independent with assistive device ?  ?Problem: RH Bed to Chair Transfers ?Goal: LTG Patient will perform bed/chair transfers w/assist (PT) ?Description: LTG: Patient will perform bed to chair transfers with assistance (PT). ?Flowsheets (Taken 07/27/2021 1219) ?LTG: Pt will perform Bed to Chair Transfers with assistance level: Independent with assistive device  ?  ?Problem: RH Car Transfers ?Goal: LTG Patient will perform car transfers with assist (PT) ?Description: LTG: Patient will perform car transfers with assistance (PT). ?Flowsheets (Taken 07/27/2021 1219) ?LTG: Pt will perform car transfers with assist:: Independent with assistive device  ?  ?Problem: RH Ambulation ?Goal: LTG Patient will ambulate in controlled environment (PT) ?Description: LTG: Patient will ambulate in a controlled environment, # of feet with assistance (PT). ?Flowsheets (Taken 07/27/2021 1219) ?LTG: Pt will ambulate in controlled environ  assist needed:: Supervision/Verbal cueing ?LTG:  Ambulation distance in controlled environment: 300 feet during 6MWT to meet MCID using LRAD ?Goal: LTG Patient will ambulate in home environment (PT) ?Description: LTG: Patient will ambulate in home environment, # of feet with assistance (PT). ?Flowsheets (Taken 07/27/2021 1219) ?LTG: Pt will ambulate in home environ  assist needed:: Independent with assistive device ?LTG: Ambulation distance in home environment: 50 ft using LRAD ?Goal: LTG Patient will ambulate in community environment (PT) ?Description: LTG: Patient will ambulate in community environment, # of feet with assistance (PT). ?Flowsheets (Taken 07/27/2021 1219) ?LTG: Pt will ambulate in community environ  assist needed:: Supervision/Verbal cueing ?LTG: Ambulation distance in community environment: >150 ft using LRAD ?  ?Problem: RH Stairs ?Goal: LTG Patient will ambulate up and down stairs w/assist (PT) ?Description: LTG: Patient will ambulate up and down # of stairs with assistance (PT) ?Flowsheets (Taken 07/27/2021 1219) ?LTG: Pt will ambulate up/down stairs assist needed:: Contact Guard/Touching assist ?LTG: Pt will  ambulate up and down number of stairs: 2 steps without rails for home entry, 12 steps with B rails for community access and increased endurance ?  ?

## 2021-07-27 NOTE — Evaluation (Signed)
Physical Therapy Assessment and Plan  Patient Details  Name: Lauren Conner MRN: [id number] Date of Birth: [birth year]  PT Diagnosis: Abnormality of gait, Cognitive deficits, Difficulty walking, Edema, and Muscle weakness Rehab Potential: Excellent ELOS: 5-7 days   Today's Date: 07/27/2021 PT Individual Time: 1005-1105 PT Individual Time Calculation (min): 60 min    Hospital Problem: Principal Problem:   Intraparenchymal hemorrhage of brain [facility])   Past Medical History: History reviewed. No pertinent past medical history. Past Surgical History: History reviewed. No pertinent surgical history.  Assessment & Plan Clinical Impression: Patient is a 81 y.o. year old right-handed female with history of anemia of chronic disease paroxysmal SVT, hypothyroidism, hypertension, CKD stage IV followed by Dr.Lehrich with nephrology at [facility] with creatinine baseline 2.0.  Per chart review patient lives alone.  1 level home 2 steps to enter.  Independent and active prior to admission.  Son and daughter-in-law are planning on moving in with her after discharge to provide assistance as needed.  Presented to [facility] 07/19/2021 after being found down on her left side with altered mental status and left-sided weakness.  Patient was tachycardic 124 with low-grade fever of 100.5 and blood pressure 154/121, oxygen saturations 100% room air.  Cranial CT scan showed large area of acute intraparenchymal hemorrhage within the right temporal parietal lobe 4.0 x 4.0 x 4.1 cm with surrounding vasogenic edema and mild effacement of the occipital horn and atrium of the right lateral ventricle.  Small amount of subarachnoid hemorrhage within [place] at the anterior right temporal lobe.  3 mm of right to left midline shift.  Admission chemistry sodium 126 chloride 97 glucose 157 BUN 41 creatinine 2.13, WBC 17,100, hemoglobin 10.2, lactic acid 3.5 troponin 55-33, blood cultures no growth to date, urinalysis negative nitrite, procalcitonin  0.32, COVID-19 and influenza testing negative.  CT of the chest without contrast showed significant motion artifact bilaterally however notable cylindrical and saccular bronchiectatic changes with chronic bronchitic airway bilaterally.  CT of chest abdomen pelvis showed bilateral hydronephrosis and proximal hydroureter.  MRI/MRA of the head 25 mL intraparenchymal hematoma centered at the right temporal occipital region.  No vascular abnormality seen underlying the right cerebral hemorrhage.  Echocardiogram with ejection fraction of 65 to 70% no wall motion abnormalities grade 1 diastolic dysfunction.  Patient was placed on broad-spectrum intravenous antibiotic for suspected sepsis.  Urology services Dr. Diona Fanti consulted in regards to urinary retention/significant bilateral hydronephrosis recommended Foley catheter tube and Flomax x2 weeks and follow-up outpatient.  A follow-up renal ultrasound 07/24/2021 revealed no acute abnormality resolved bilateral hydronephrosis.  Renal services Dr. Moshe Cipro consulted in regards to longstanding CKD stage IV with baseline creatinine 2.0 elevating to 2.78 felt to be related to BP variability going from very high to very low normal.  Amlodipine and metoprolol discontinued and lattest creatinine 2.44.  Her antibiotic regimen has been transition to Levaquin 500 mg every 48 hours 07/23/2021 per PCCM x14 days.  RVP negative, blood cultures negative to date.  Anemia of chronic disease A's hemoglobin 6.9 awaiting plan transfusion.  She is tolerating a regular consistency diet.  Therapy evaluations completed due to patient decreased functional mobility left side weakness was admitted for a comprehensive rehab program. Patient transferred to CIR on 07/26/2021 .   Patient currently requires  CGA-supervision  with mobility secondary to muscle weakness, decreased cardiorespiratoy endurance, decreased visual perceptual skills, decreased memory, and decreased sitting balance, decreased  standing balance, decreased postural control, and decreased balance strategies.  Prior to hospitalization, patient was  independent  with mobility and lived with Alone in a House home.  Home access is 2Stairs to enter.  Patient will benefit from skilled PT intervention to maximize safe functional mobility, minimize fall risk, and decrease caregiver burden for planned discharge home with intermittent assist.  Anticipate patient will benefit from follow up OP at discharge.  PT - End of Session Activity Tolerance: Tolerates 30+ min activity with multiple rests Endurance Deficit: Yes Endurance Deficit Description: Fatigues with min-mod acticity with mild SOB PT Assessment Rehab Potential (ACUTE/IP ONLY): Excellent PT Barriers to Discharge: Home environment access/layout;Neurogenic Bowel & Bladder;Behavior PT Barriers to Discharge Comments: 2 STE no rails, Foley catheter, decreased STM PT Patient demonstrates impairments in the following area(s): Balance;Perception;Safety;Behavior;Edema;Sensory;Skin Integrity;Endurance;Motor;Nutrition;Pain PT Transfers Functional Problem(s): Bed Mobility;Bed to Chair;Car;Furniture PT Locomotion Functional Problem(s): Ambulation;Wheelchair Mobility;Stairs PT Plan PT Intensity: Minimum of 1-2 x/day ,45 to 90 minutes PT Frequency: 5 out of 7 days PT Duration Estimated Length of Stay: 5-7 days PT Treatment/Interventions: Ambulation/gait training;Cognitive remediation/compensation;Discharge planning;DME/adaptive equipment instruction;Functional mobility training;Pain management;Psychosocial support;Splinting/orthotics;Therapeutic Activities;UE/LE Strength taining/ROM;Visual/perceptual remediation/compensation;Wheelchair propulsion/positioning;UE/LE Coordination activities;Therapeutic Exercise;Stair training;Skin care/wound management;Patient/family education;Neuromuscular re-education;Functional electrical stimulation;Disease management/prevention;Community  reintegration;Balance/vestibular training PT Transfers Anticipated Outcome(s): mod I using LRAD PT Locomotion Anticipated Outcome(s): Mod I using LRAD household distances, supervision community level PT Recommendation Recommendations for Other Services: Therapeutic Recreation consult Therapeutic Recreation Interventions: Stress management Follow Up Recommendations: Outpatient PT Patient destination: Home Equipment Recommended: Rolling walker with 5" wheels   PT Evaluation Precautions/Restrictions Precautions Precautions: Fall Precaution Comments: hx of orthostatic BP, L field cut Restrictions Weight Bearing Restrictions: No Pain Interference Pain Interference Pain Effect on Sleep: 0. Does not apply - I have not had any pain or hurting in the past 5 days Pain Interference with Therapy Activities: 1. Rarely or not at all Pain Interference with Day-to-Day Activities: 1. Rarely or not at all Home Living/[place] Available Help at Discharge: Family;Available 24 hours/day Type of Home: House Home Access: Stairs to enter [employer] of Steps: 2 Entrance Stairs-Rails: None Home Layout: One level Bathroom Shower/Tub: Multimedia programmer: Standard Bathroom Accessibility: Yes  Lives With: Alone Prior Function  Able to Take Stairs?: Yes Driving: Yes Vocation: Retired Radiographer, therapeutic - History Ability to See in Adequate Light: 0 Adequate Vision - Assessment Eye Alignment: Within Functional Limits Ocular Range of Motion: Within Functional Limits Alignment/Gaze Preference: Within Defined Limits Tracking/Visual Pursuits: Able to track stimulus in all quads without difficulty Saccades: Decreased speed of saccadic movement;Additional eye shifts occurred during testing Perception Perception: Impaired Inattention/Neglect: Does not attend to left visual field Praxis Praxis: Intact  Cognition Overall Cognitive Status:  Impaired/Different from baseline Arousal/Alertness: Awake/alert Orientation Level: Oriented X4 Year: 2023 Month: March Day of Week: Correct Attention: Sustained Sustained Attention: Appears intact Memory: Impaired Memory Impairment: Decreased short term memory Decreased Short Term Memory: Verbal basic;Functional basic Awareness: Appears intact Safety/Judgment: Appears intact [employer] Scales of Cognitive Functioning: Purposeful/appropriate Sensation Sensation Light Touch: Appears Intact Proprioception: Appears Intact Coordination Gross Motor Movements are Fluid and Coordinated: No Coordination and Movement Description: generalized deconditioning and decreased postural control Heel Shin Test: Slow and deliberate bilaterally Motor  Motor Motor: Other (comment);Abnormal postural alignment and control Motor - Skilled Clinical Observations: generalized deconditioning and decreased postural control   Trunk/Postural Assessment  Cervical Assessment Cervical Assessment: Within Functional Limits Thoracic Assessment Thoracic Assessment: Within Functional Limits Lumbar Assessment Lumbar Assessment: Within Functional Limits Postural Control Postural Control: Deficits on evaluation (decreased/delayed)  Balance Standardized Balance Assessment Standardized Balance  Assessment: Berg Balance Test Berg Balance Test Sit to Stand: Able to stand  independently using hands Standing Unsupported: Able to stand 2 minutes with supervision Sitting with Back Unsupported but Feet Supported on Floor or Stool: Able to sit safely and securely 2 minutes Stand to Sit: Controls descent by using hands Transfers: Able to transfer safely, definite need of hands Standing Unsupported with Eyes Closed: Able to stand 10 seconds with supervision Standing Ubsupported with Feet Together: Able to place feet together independently but unable to hold for 30 seconds From Standing, Reach Forward with Outstretched  Arm: Can reach forward >12 cm safely (5") From Standing Position, Pick up Object from Floor: Able to pick up shoe, needs supervision From Standing Position, Turn to Look Behind Over each Shoulder: Turn sideways only but maintains balance Turn 360 Degrees: Needs close supervision or verbal cueing Standing Unsupported, Alternately Place Feet on Step/Stool: Able to complete 4 steps without aid or supervision Standing Unsupported, One Foot in Front: Able to plae foot ahead of the other independently and hold 30 seconds Standing on One Leg: Tries to lift leg/unable to hold 3 seconds but remains standing independently Total Score: 36 Static Sitting Balance Static Sitting - Balance Support: No upper extremity supported;Feet supported Static Sitting - Level of Assistance: 7: Independent Dynamic Sitting Balance Dynamic Sitting - Balance Support: During functional activity;Feet supported Dynamic Sitting - Level of Assistance: 5: Stand by assistance Static Standing Balance Static Standing - Balance Support: No upper extremity supported Static Standing - Level of Assistance: 5: Stand by assistance Dynamic Standing Balance Dynamic Standing - Balance Support: No upper extremity supported;During functional activity Dynamic Standing - Level of Assistance: 5: Stand by assistance Extremity Assessment  RLE Assessment RLE Assessment: Exceptions to [facility] Active Range of Motion (AROM) Comments: WFL for all functional mobility General Strength Comments: Grossly 5/5 throughout except hip flexion/abduction 4/5 LLE Assessment LLE Assessment: Exceptions to [facility] Active Range of Motion (AROM) Comments: WFL for all functional mobility General Strength Comments: Grossly 5/5 throughout except hip flexion/abduction 4/5  Care Tool Care Tool Bed Mobility Roll left and right activity   Roll left and right assist level: Independent    Sit to lying activity   Sit to lying assist level: Independent    Lying to sitting  on side of bed activity   Lying to sitting on side of bed assist level: the ability to move from lying on the back to sitting on the side of the bed with no back support.: Independent     Care Tool Transfers Sit to stand transfer   Sit to stand assist level: Supervision/Verbal cueing    Chair/bed transfer   Chair/bed transfer assist level: Supervision/Verbal cueing     Physiological scientist transfer assist level: Supervision/Verbal cueing      Care Tool Locomotion Ambulation   Assist level: Contact Guard/Touching assist Assistive device: No Device Max distance: 100 ft  Walk 10 feet activity   Assist level: Contact Guard/Touching assist Assistive device: No Device   Walk 50 feet with 2 turns activity   Assist level: Contact Guard/Touching assist Assistive device: No Device  Walk 150 feet activity Walk 150 feet activity did not occur: Safety/medical concerns (decreased activity tolerance)      Walk 10 feet on uneven surfaces activity   Assist level: Minimal Assistance - Patient > 75%    Stairs   Assist level: Contact Guard/Touching assist Stairs assistive device: 2  hand rails Max number of stairs: 8  Walk up/down 1 step activity   Walk up/down 1 step (curb) assist level: Minimal Assistance - Patient > 75% Walk up/down 1 step or curb assistive device: Other (comment) (HHA)  Walk up/down 4 steps activity   Walk up/down 4 steps assist level: Contact Guard/Touching assist Walk up/down 4 steps assistive device: 2 hand rails  Walk up/down 12 steps activity   Walk up/down 12 steps assist level: Contact Guard/Touching assist Walk up/down 12 steps assistive device: 2 hand rails  Pick up small objects from floor   Pick up small object from the floor assist level: Supervision/Verbal cueing    Wheelchair Is the patient using a wheelchair?: Yes Type of Wheelchair: Manual   Wheelchair assist level: Minimal Assistance - Patient > 75%;Supervision/Verbal  cueing Max wheelchair distance: >150 ft  Wheel 50 feet with 2 turns activity   Assist Level: Supervision/Verbal cueing  Wheel 150 feet activity   Assist Level: Minimal Assistance - Patient > 75%    Refer to Care Plan for Long Term Goals  SHORT TERM GOAL WEEK 1 PT Short Term Goal 1 (Week 1): STG=LTG due to ELOS  Recommendations for other services: Therapeutic Recreation  Stress management  Skilled Therapeutic Intervention Evaluation completed (see details above and below) with education on PT POC and goals and individual treatment initiated with focus on functional mobility/transfers, LE strength, dynamic standing balance/coordination, ambulation, stair navigation, simulated car transfers, and improved endurance with activity Patient provided with 18"x18" wheelchair with 2" foam cushion and adjustments made to promote optimal seating posture and pressure distribution. Patient also provided with RW for use in room and therapist adjusted to proper height for patient.  Patient in recliner handed off from OT upon PT arrival. Patient alert and agreeable to PT session. Patient denied pain during session, reports recent headaches since admission. Asking if she will go home with the Foley catheter, LPN made aware.   Vitals: BP 129/86, HR 100, asymptomatic throughout session with knee high TEDs donned  Therapeutic Activity: Bed Mobility: Patient performed rolling R/L and supine to/from sit independently in a flat bed without use of bed rails.  Transfers: Patient performed sit to/from stand and stand pivot with close supervision from various household heights without AD throughout session.   Gait Training:  6 Min Walk Test:  Instructed patient to ambulate as quickly and as safely as possible for 6 minutes using LRAD. Patient was allowed to take standing rest breaks without stopping the test, but if the patient required a sitting rest break the clock would be stopped and the test would be over.   Results: 100 feet, stopped at 1 min 20 sec due to fatigue, (30.5 meters, Avg speed 0.4 m/s) without an AD with CGA progressing to close supervision. Results indicate that the patient has reduced endurance with ambulation compared to age matched norms.  Age Matched Norms: 65-79 yo F: 471 meters MDC: 58.21 meters (190.98 feet) or 50 meters (ANPTA Core Set of Outcome Measures for Adults with Neurologic Conditions, 2018)  Patient ambulated 50 feet using RW with supervision. Ambulated with decreased gait speed, decreased step length and height, narrow BOS, forward trunk lean, and downward head gaze. Provided verbal cues for erect posture. Patient ascended/descended 8 steps using HHA with min A for first 2 steps to simulate home entry, then B rails for remaining steps with CGA. Performed step-to gait pattern throughout. Provided min cues for technique and sequencing.  Patient ambulated up/down a ramp, over  10 feet of mulch (unlevel surface), and up/down a curb to simulate community ambulation over unlevel surfaces with CGA-min A without an AD.   Wheelchair Mobility:  Patient propelled wheelchair >150 feet with supervision and min A x2 due to cutting turns tight to the L. Provided verbal cues for visual scanning to the L on turns  Neuromuscular Re-ed: Patient demonstrated increased fall risk noted by score of 36/56 on the Berg Balance Scale.  <45/56 = fall risk, <42/56 = predictive of recurrent falls, <40/56 = 100% fall risk  >41 = independent, 21-40 = assistive device, 0-20 = wheelchair level  MDC 6.9 (4 pts 45-56, 5 pts 35-44, 7 pts 25-34) (ANPTA Core Set of Outcome Measures for Adults with Neurologic Conditions, 2018) Five times Sit to Stand Test (FTSS) Method: Use a straight back chair with a solid seat that is 17-18 high. Ask participant to sit on the chair with arms folded across their chest.   Instructions: Stand up and sit down as quickly as possible 5 times, keeping your arms folded  across your chest.   Measurement: Stop timing when the participant touches the chair in sitting the 5th time. Score: 0, unable to stand without using hands at this time Cut off scores indicative of increased fall risk: >12 sec CVA, >16 sec PD, >13 sec vestibular (ANPTA Core Set of Outcome Measures for Adults with Neurologic Conditions, 2018)  Instructed pt in results of PT evaluation as detailed above, PT POC, rehab potential, rehab goals, and discharge recommendations. Additionally discussed CIR's policies regarding fall safety and use of chair alarm and/or quick release belt. Pt verbalized understanding and in agreement. Will update pt's family members as they become available.   Patient in recliner with B lower extremities elevated at end of session with breaks locked, chair alarm set, and all needs within reach.    Discharge Criteria: Patient will be discharged from PT if patient refuses treatment 3 consecutive times without medical reason, if treatment goals not met, if there is a change in medical status, if patient makes no progress towards goals or if patient is discharged from hospital.  The above assessment, treatment plan, treatment alternatives and goals were discussed and mutually agreed upon: by patient  Doreene Burke PT, DPT  07/27/2021, 12:19 PM

## 2021-07-27 NOTE — Progress Notes (Signed)
PROGRESS NOTE   Subjective/Complaints:  RIght sided HA, relieved with tylenol   ROS- neg CP, SOB, N/V/D Objective:   CT ABDOMEN PELVIS WO CONTRAST  Result Date: 07/25/2021 CLINICAL DATA:  Abdominal pain, acute, nonlocalized anemia, unclear etiology, r/o RPH EXAM: CT ABDOMEN AND PELVIS WITHOUT CONTRAST TECHNIQUE: Multidetector CT imaging of the abdomen and pelvis was performed following the standard protocol without IV contrast. RADIATION DOSE REDUCTION: This exam was performed according to the departmental dose-optimization program which includes automated exposure control, adjustment of the mA and/or kV according to patient size and/or use of iterative reconstruction technique. COMPARISON:  CT 07/20/2021 FINDINGS: Lower chest: Large hiatal hernia. 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 Small bilateral pleural effusions. Hepatobiliary: Unchanged hepatic cysts. The gallbladder is unremarkable. Pancreas: Unremarkable. No pancreatic ductal dilatation or surrounding inflammatory changes. Spleen: Normal in size.  Multiple calcified splenic granulomas. Adrenals/Urinary Tract: Adrenal glands are unremarkable. Interval placement of the Foley catheter with decompression of the bladder and renal collecting systems. Unchanged multiple bilateral renal cysts. Stomach/Bowel: Large hiatal hernia with intrathoracic stomach. There is no evidence of bowel obstruction.There is retained barium within the colon. The appendix is not definitively visualized. Extensive sigmoid diverticulosis without evidence of acute diverticulitis. Vascular/Lymphatic: Aortoiliac atherosclerotic calcifications. No AAA. No lymphadenopathy. Reproductive: Unremarkable. Other: No free air. No abdominopelvic ascites. No evidence of retroperitoneal hematoma as clinically questioned.  Musculoskeletal: No acute osseous abnormality. No suspicious lytic or blastic lesions. Multilevel degenerative changes of the spine. IMPRESSION: No acute abdominopelvic abnormality. Interval Foley catheter placement with decompression of the bladder and renal collecting systems. Colonic diverticulosis. Unchanged large hiatal hernia. Unchanged parenchymal findings the lung bases consistent with chronic atypical mycobacterial infection. Small bilateral pleural effusions. Electronically Signed   By: Maurine Simmering M.D.   On: 07/25/2021 10:56   CT HEAD WO CONTRAST (5MM)  Result Date: 07/25/2021 CLINICAL DATA:  Subarachnoid hemorrhage follow-up EXAM: CT HEAD WITHOUT CONTRAST TECHNIQUE: Contiguous axial images were obtained from the base of the skull through the vertex without intravenous contrast. RADIATION DOSE REDUCTION: This exam was performed according to the departmental dose-optimization program which includes automated exposure control, adjustment of the mA and/or kV according to patient size and/or use of iterative reconstruction technique. COMPARISON:  Three days ago FINDINGS: Brain: Unchanged shape and extent of parenchymal hemorrhage in the right temporal and parietal lobes with irregular shape limiting accurate measurement. Small volume overlying subarachnoid blood. Midline shift measures 3 mm. No entrapment or interval infarct Vascular: Atheromatous calcification Skull: Normal. Negative for fracture or focal lesion. Sinuses/Orbits: No acute finding. IMPRESSION: Unchanged right cerebral hemorrhage with adjacent edema. Midline shift measures 3 mm. Electronically Signed   By: Jorje Guild M.D.   On: 07/25/2021 11:28   Recent Labs    07/25/21 0553 07/25/21 2140 07/26/21 0153  WBC 6.2  --  7.5  HGB 6.9* 11.0* 10.9*  HCT 20.8* 31.7* 31.8*  PLT 203  --  226   Recent Labs    07/25/21 0553 07/26/21 0153  NA 136 136  K 3.9 4.2  CL 112* 110  CO2 18* 19*  GLUCOSE 80 82  BUN 35* 32*  CREATININE  2.44* 2.47*  CALCIUM 7.5* 8.1*    Intake/Output Summary (Last 24 hours) at 07/27/2021 0744 Last data filed at 07/27/2021 2376 Gross per 24 hour  Intake 240 ml  Output 1500 ml  Net -1260 ml        Physical Exam: Vital Signs Blood pressure (!) 159/93, pulse 100, temperature 98.7 F (37.1 C), resp. rate 14, height 5\' 3"  (1.6 m), weight 55.6 kg, SpO2 97 %.   General: No acute distress Mood and affect are appropriate Heart: Regular rate and rhythm no rubs murmurs or extra sounds Lungs: Clear to auscultation, breathing unlabored, no rales or wheezes Abdomen: Positive bowel sounds, soft nontender to palpation, nondistended Extremities: No clubbing, cyanosis, or edema Skin: No evidence of breakdown, no evidence of rash Neurologic: Cranial nerves II through XII intact, motor strength is 5/5 in bilateral deltoid, bicep, tricep, grip, hip flexor, knee extensors, ankle dorsiflexor and plantar flexor Sensory exam normal sensation to light touch and proprioception in bilateral upper and lower extremities Mild Left neglect noted  Musculoskeletal: Full range of motion in all 4 extremities. No joint swelling   Assessment/Plan: 1. Functional deficits which require 3+ hours per day of interdisciplinary therapy in a comprehensive inpatient rehab setting. Physiatrist is providing close team supervision and 24 hour management of active medical problems listed below. Physiatrist and rehab team continue to assess barriers to discharge/monitor patient progress toward functional and medical goals  Care Tool:  Bathing              Bathing assist       Upper Body Dressing/Undressing Upper body dressing   What is the patient wearing?: Pull over shirt    Upper body assist Assist Level: Minimal Assistance - Patient > 75%    Lower Body Dressing/Undressing Lower body dressing      What is the patient wearing?: Pants     Lower body assist Assist for lower body dressing: Moderate  Assistance - Patient 50 - 74%     Toileting Toileting    Toileting assist Assist for toileting: Moderate Assistance - Patient 50 - 74%     Transfers Chair/bed transfer  Transfers assist     Chair/bed transfer assist level: Minimal Assistance - Patient > 75%     Locomotion Ambulation   Ambulation assist              Walk 10 feet activity   Assist           Walk 50 feet activity   Assist           Walk 150 feet activity   Assist           Walk 10 feet on uneven surface  activity   Assist           Wheelchair     Assist               Wheelchair 50 feet with 2 turns activity    Assist            Wheelchair 150 feet activity     Assist          Blood pressure (!) 159/93, pulse 100, temperature 98.7 F (37.1 C), resp. rate 14, height 5\' 3"  (1.6 m), weight 55.6 kg, SpO2 97 %.  Medical Problem List and Plan: 1. 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  Antithrombotics: -DVT/anticoagulation:  Mechanical: Antiembolism stockings, thigh (TED hose) Bilateral lower extremities             -antiplatelet therapy: N/A 3. Pain Management: Tylenol as needed 4. Mood: Provide emotional support             -antipsychotic agents: N/A 5. Neuropsych: This patient is capable of making decisions on her own behalf. 6. Skin/Wound Care: Routine skin checks 7. Fluids/Electrolytes/Nutrition: Routine in and outs with follow-up chemistries 8.  CAP with baseline bronchiectasis.  Follow-up PCCM.  Complete 14-day course Levaquin initiated 07/23/2021.  Continue inhalers as directed 9.  Acute urinary retention with significant bilateral hydronephrosis.  Follow-up 07/23/2021 Dr. Diona Fanti.  Recommend CONTINUE FOLEY CATHETER TUBE  and Flomax for at  least 2 weeks and follow-up outpatient.  Follow-up renal ultrasound 07/24/2021 showed resolved bilateral hydronephrosis 10.  AKI on CKD stage IV.  Patient followed by Dr.Lehrich at [facility] with baseline creatinine 2.0.  Currently seen by Dr. Moshe Cipro while at [facility].  Currently with worsening of renal function suspect related to BP variability with amlodipine and metoprolol discontinued.  Follow-up chemistries 11.  Hypertension.  Amlodipine and Lopressor currently discontinued.  Continue to monitor Vitals:   07/26/21 2128 07/27/21 0426  BP: (!) 148/78 (!) 159/93  Pulse: 98 100  Resp:  14  Temp:  98.7 F (37.1 C)  SpO2:  97%    12.  Hypothyroidism: continue Synthroid 13.  History of cardiac dysfunction with SVT.  Cardiac rate controlled. Monitor HR TID.  14.  Anemia of chronic disease. Hgb stable at 10.9 on 3/11.  Repeat Hgb tomorrow. Transfused 2 units 07/25/2021 patient had been receiving Aranesp every 2 weeks      LOS: 1 days A FACE TO Inez E Fatina Sprankle 07/27/2021, 7:44 AM

## 2021-07-27 NOTE — Evaluation (Addendum)
7-10Occupational Therapy Assessment and Plan ? ?Patient Details  ?Name: Lauren Conner ?MRN: [id number] ?Date of Birth: Mar 05, 1941 ? ?OT Diagnosis: muscle weakness (generalized) ?Rehab Potential:  Good ?ELOS: 7-10days   ? ?Today's Date: 07/27/2021 ?OT Individual Time 850-950 ?     ? ?Hospital Problem: Principal Problem: ?  Intraparenchymal hemorrhage of brain ([place]) ? ? ?Past Medical History: History reviewed. No pertinent past medical history. ?Past Surgical History: History reviewed. No pertinent surgical history. ? ?Assessment & Plan ?Clinical Impression: Patient is a 81 y.o. year old female right hand dominate who was recently admitted to the hospital on 07/19/2021 with  history of anemia of chronic disease paroxysmal SVT, hypothyroidism, hypertension, CKD stage IV followed by Dr.Lehrich with nephrology at [facility] with creatinine baseline 2.0.  Per chart review patient lives alone.  1 level home 2 steps to enter.  Independent and active prior to admission.  Son and daughter-in-law are planning on moving in with her after discharge to provide assistance as needed.  Presented to [facility] 07/19/2021 after being found down on her left side with altered mental status and left-sided weakness.  Patient was tachycardic 124 with low-grade fever of 100.5 and blood pressure 154/121, oxygen saturations 100% room air.  Cranial CT scan showed large area of acute intraparenchymal hemorrhage within the right temporal parietal lobe 4.0 x 4.0 x 4.1 cm with surrounding vasogenic edema and mild effacement of the occipital horn and atrium of the right lateral ventricle.  Small amount of subarachnoid hemorrhage within [place] at the anterior right temporal lobe.  3 mm of right to left midline shift. .  Patient transferred to CIR on 07/26/2021 secondary to presenting as deconditioned  secondary to generalize muscle weakness and believed to benefit from inpatient therapy to improve upon functional outcome for a safe return to home at and Independent  LOF. ? ?Patient currently requires min with basic self-care skills secondary to muscle weakness.  Prior to hospitalization, patient could complete BADL/IADL with  independence. ? ?Patient will benefit from skilled intervention to increase independence with basic self-care skills and increase level of independence with iADL prior to discharge  Home with strong family support .  Anticipate patient will require intermittent supervision and no further OT follow recommended. ? ?  ? ? ?OT Evaluation ?Precautions/Restrictions  ? Fall Risk, perception challenges secondary to visual field cut ?General ? General Mus Weakness, Deconditioning ?Vital Signs ?BP129/86, 02 100%, HR 99 ?Pain 0 ?Pain Assessment ?Pain Scale: 0-10 ?Pain Score: 0-No pain ?Home Living/Prior Functioning ?Home Living ?Available Help at Discharge: Family, Available 24 hours/day ?Type of Home: House ?Home Access: Stairs to enter ?Entrance Stairs-Number of Steps: 2 ?Entrance Stairs-Rails: None ?Home Layout: One level ?Bathroom Shower/Tub: Walk-in shower ?Bathroom Toilet: Standard ?Bathroom Accessibility: Yes ? Lives With: Alone ?IADL History ?Homemaking Responsibilities: Yes ?Meal Prep Responsibility: Primary ?Laundry Responsibility: Primary ?Cleaning Responsibility: Primary ?Bill Paying/Finance Responsibility: Primary ?Shopping Responsibility: Primary ?Current License: Yes ?Prior Function ?Level of Independence: Independent with basic ADLs, Independent with transfers, Independent with homemaking with ambulation, Independent with gait, Independent with homemaking with wheelchair ? Able to Take Stairs?: Yes ?Driving: Yes ?Vocation: Retired ?Leisure: Hobbies-yes (Comment) (community activites) ?Vision ? Patient presents with visual field cut ?Perception  ? Impaired ?Praxis ?Praxis: Intact ?Cognition ?Overall Cognitive Status: Impaired/Different from baseline ?Arousal/Alertness: Awake/alert ?Orientation Level: Place ?Year: 2023 ?Month: March ?Day of Week:  Correct ?Memory: Impaired ?Memory Impairment: Decreased recall of new information;Retrieval deficit ?Decreased Short Term Memory: Verbal basic;Functional basic ?Immediate Memory Recall: Sock;Blue;Bed ?Memory Recall Sock:  Without Cue ?Memory Recall Blue: Without Cue ?Memory Recall Bed: Without Cue ?Attention: Sustained ?Sustained Attention: Appears intact ?Awareness: Appears intact ?Problem Solving: Impaired ?Problem Solving Impairment: Functional complex ?Safety/Judgment: Appears intact ?[place] [employer] Scales of Cognitive Functioning: Purposeful/appropriate ?Sensation intact ?  ?Motor Patient presents with challenge secondary to deconditioning resulting in generalized muscle weakness impacting how she approaches her environment during the performance of BADL/IADL ?   ?Trunk/Postural Assessment  ?   ?Balance Challenges with static and dynamic balance over time with standing balance. ?  ?Extremity/Trunk Assessment good anatomical positioning ?  ?  ? ?Care Tool ?Care Tool Self Care ?Eating   ?Eating Assist Level: Set up assist (based on functional status) ?   ?Oral Care    ?Oral Care Assist Level: Set up assist ?   ?Bathing   ?Body parts bathed by patient: Right arm;Left arm;Chest;Abdomen;Front perineal area;Buttocks;Face;Left lower leg;Right lower leg;Left upper leg;Right upper leg ?  ?  ?Assist Level: Minimal Assistance - Patient > 75% (secondary to cather placement and manevering to prevent displacement) ?   ?Upper Body Dressing(including orthotics)   ?What is the patient wearing?: Pull over shirt ?  ?Assist Level: Contact Guard/Touching assist ?   ?Lower Body Dressing (excluding footwear)   ?What is the patient wearing?: Pants;Underwear/pull up ?Assist for lower body dressing: Minimal Assistance - Patient > 75% ?   ?Putting on/Taking off footwear   ?What is the patient wearing?: Socks ?Assist for footwear: Set up assist ?   ?  ? Care Tool Toileting ?Toileting activity Toileting Activity did not occur (Clothing  management and hygiene only): N/A (no void or bm) ?Assist for toileting: Minimal Assistance - Patient > 75% (Patient has cather and was able to manage the area secondary to placement.) ?   ? ?Care Tool Bed Mobility ?Roll left and right activity   ?Roll left and right assist level: Independent ?   ?Sit to lying activity   ?Sit to lying assist level: Independent ?   ?Lying to sitting on side of bed activity   ?Lying to sitting on side of bed assist level: the ability to move from lying on the back to sitting on the side of the bed with no back support.: Independent ?   ? ?Care Tool Transfers ?Sit to stand transfer   ?Sit to stand assist level: Contact Guard/Touching assist ?   ?Chair/bed transfer   ?Chair/bed transfer assist level: Contact Guard/Touching assist ?   ? Toilet transfer   ?Assist Level: Contact Guard/Touching assist ?   ? ?Care Tool Cognition ? ?Expression of Ideas and Wants Expression of Ideas and Wants: 4. Without difficulty (complex and basic) - expresses complex messages without difficulty and with speech that is clear and easy to understand  ?Understanding Verbal and Non-Verbal Content Understanding Verbal and Non-Verbal Content: 4. Understands (complex and basic) - clear comprehension without cues or repetitions ?  ?Memory/Recall Ability Memory/Recall Ability : Current season;Location of own room;Staff names and faces;That he or she is in a hospital/hospital unit  ? ?Refer to Care Plan for Long Term Goals ? ?SHORT TERM GOAL WEEK 1 ?  ? ?Recommendations for other services: None   ? ?Skilled Therapeutic Intervention ?ADL/IADL ?  ?Mobility  ? ModI with the use of RW for long distance for safe and independent function during task completion.  ? ? ?Discharge Criteria: Patient will be discharged from OT if patient refuses treatment 3 consecutive times without medical reason, if treatment goals not met, if there is a  change in medical status, if patient makes no progress towards goals or if patient is  discharged from hospital. ? ?The above assessment, treatment plan, treatment alternatives and goals were discussed and mutually agreed upon: by patient ? ?Yvonne Kendall ?07/27/2021, 5:02 PM  ?

## 2021-07-28 DIAGNOSIS — I10 Essential (primary) hypertension: Secondary | ICD-10-CM

## 2021-07-28 DIAGNOSIS — N184 Chronic kidney disease, stage 4 (severe): Secondary | ICD-10-CM

## 2021-07-28 DIAGNOSIS — D62 Acute posthemorrhagic anemia: Secondary | ICD-10-CM

## 2021-07-28 DIAGNOSIS — R339 Retention of urine, unspecified: Secondary | ICD-10-CM

## 2021-07-28 LAB — COMPREHENSIVE METABOLIC PANEL
ALT: 11 U/L (ref 0–44)
AST: 17 U/L (ref 15–41)
Albumin: 2.2 g/dL — ABNORMAL LOW (ref 3.5–5.0)
Alkaline Phosphatase: 56 U/L (ref 38–126)
Anion gap: 10 (ref 5–15)
BUN: 26 mg/dL — ABNORMAL HIGH (ref 8–23)
CO2: 22 mmol/L (ref 22–32)
Calcium: 8.2 mg/dL — ABNORMAL LOW (ref 8.9–10.3)
Chloride: 105 mmol/L (ref 98–111)
Creatinine, Ser: 2.53 mg/dL — ABNORMAL HIGH (ref 0.44–1.00)
GFR, Estimated: 19 mL/min — ABNORMAL LOW (ref >60)
Glucose, Bld: 79 mg/dL (ref 70–99)
Potassium: 4.3 mmol/L (ref 3.5–5.1)
Sodium: 137 mmol/L (ref 135–145)
Total Bilirubin: 0.5 mg/dL (ref 0.3–1.2)
Total Protein: 5.4 g/dL — ABNORMAL LOW (ref 6.5–8.1)

## 2021-07-28 LAB — CBC WITH DIFFERENTIAL/PLATELET
Abs Immature Granulocytes: 0.03 10*3/uL (ref 0.00–0.07)
Basophils Absolute: 0.1 10*3/uL (ref 0.0–0.1)
Basophils Relative: 1 %
Eosinophils Absolute: 0.5 10*3/uL (ref 0.0–0.5)
Eosinophils Relative: 8 %
HCT: 32.5 % — ABNORMAL LOW (ref 36.0–46.0)
Hemoglobin: 11.1 g/dL — ABNORMAL LOW (ref 12.0–15.0)
Immature Granulocytes: 1 %
Lymphocytes Relative: 24 %
Lymphs Abs: 1.4 10*3/uL (ref 0.7–4.0)
MCH: 31.4 pg (ref 26.0–34.0)
MCHC: 34.2 g/dL (ref 30.0–36.0)
MCV: 91.8 fL (ref 80.0–100.0)
Monocytes Absolute: 0.8 10*3/uL (ref 0.1–1.0)
Monocytes Relative: 14 %
Neutro Abs: 2.9 10*3/uL (ref 1.7–7.7)
Neutrophils Relative %: 52 %
Platelets: 220 10*3/uL (ref 150–400)
RBC: 3.54 MIL/uL — ABNORMAL LOW (ref 3.87–5.11)
RDW: 15.2 % (ref 11.5–15.5)
WBC: 5.6 10*3/uL (ref 4.0–10.5)
nRBC: 0 % (ref 0.0–0.2)

## 2021-07-28 MED ORDER — ACETAMINOPHEN 160 MG/5ML PO SOLN
650.0000 mg | Freq: Three times a day (TID) | ORAL | Status: DC
  Administered 2021-07-28 – 2021-07-30 (×6): 650 mg via ORAL
  Filled 2021-07-28 (×6): qty 20.3

## 2021-07-28 MED ORDER — BISACODYL 10 MG RE SUPP
10.0000 mg | Freq: Every day | RECTAL | Status: DC | PRN
  Filled 2021-07-28: qty 1

## 2021-07-28 MED ORDER — SORBITOL 70 % SOLN
60.0000 mL | Status: AC
  Administered 2021-07-28: 60 mL via ORAL
  Filled 2021-07-28: qty 60

## 2021-07-28 NOTE — Progress Notes (Addendum)
Inpatient Rehabilitation Care Coordinator ?Assessment and Plan ?Patient Details  ?Name: Lauren Conner ?MRN: [id number] ?Date of Birth: 03-May-1941 ? ?Today's Date: 07/28/2021 ? ?Hospital Problems: Principal Problem: ?  Intraparenchymal hemorrhage of brain ([place]) ? ?Past Medical History: History reviewed. No pertinent past medical history. ?Past Surgical History: History reviewed. No pertinent surgical history. ?Social History:  reports that she has never smoked. She has never used smokeless tobacco. No history on file for alcohol use and drug use. ? ?Family / Support Systems ?Marital Status: Widow/Widower ?How Long?: Sept 2007 ?Patient Roles: Parent ?Spouse/Significant Other: Widowed ?Children: 3 adult sons- Lauren Conner (POA/[phone]/libes in [place]), Lauren Conner [phone] in [place]), and Lauren Conner (lives in [place]) ?Other Supports: various family members ?Anticipated Caregiver: Pt son Lauren Conner and his wife Lauren Conner ?Ability/Limitations of Caregiver: None reported ?Caregiver Availability: 24/7 ?Family Dynamics: Pt lives alone and care for herself. She has someone that manages  her lawncare needs. ? 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 ?Education: business college ?Health Literacy - How often do you need to have someone help you when you read instructions, pamphlets, or other written material from your doctor or pharmacy?: Rarely ?Writes: Yes ?Employment Status: Retired ?Date Retired/Disabled/Unemployed: 1965 ?Legal History/Current Legal Issues: Denies ?Guardian/Conservator: POARandall Conner  ? ?Abuse/Neglect ?Abuse/Neglect Assessment Can Be Completed: Yes ?Physical Abuse: Denies ?Verbal Abuse: Denies ?Sexual Abuse: Denies ?Exploitation of patient/patient's resources: Denies ?Self-Neglect: Denies ? ?Patient response to: ?Social Isolation - How often do you feel lonely or isolated from those around you?:  Never ? ?Emotional Status ?Pt's affect, behavior and adjustment status: Pt in good spirits at time of visit. Pt is adjusting as best as possible, and would like to regain her independence again since she reports she likes her "me time." ?Recent Psychosocial Issues: Denies. Could benefit from meeting with neuropsych due to recent loss of independence. ?Psychiatric History: Denies ?Substance Abuse History: Denies ? ?Patient / Family Perceptions, Expectations & Goals ?Pt/Family understanding of illness & functional limitations: Pt and family have a general understanding of pt care needs. ?Premorbid pt/family roles/activities: Independent with ADLs/IADLs ?Anticipated changes in roles/activities/participation: Assistance with ADLs/IADLs ?Pt/family expectations/goals: TBD ? ?Community Resources ?Community Agencies: None ?Premorbid Home Care/DME Agencies: None ?Transportation available at discharge: TBD ?Is the patient able to respond to transportation needs?: Yes ?In the past 12 months, has lack of transportation kept you from medical appointments or from getting medications?: No ?In the past 12 months, has lack of transportation kept you from meetings, work, or from getting things needed for daily living?: No ?Resource referrals recommended: Neuropsychology ? ?Discharge Planning ?Living Arrangements: Alone, Children, Other relatives ?Support Systems: Children, Other relatives ?Type of Residence: Private residence ?Insurance Resources: [employer], Multimedia programmer (specify) Sports administrator) ?Financial Resources: Social Security ?Financial Screen Referred: No ?Living Expenses: Own ?Money Management: Patient ?Does the patient have any problems obtaining your medications?: No ?Home Management: Pt performed all homecare needs, bills, and transportation to appointments. ?Patient/Family Preliminary Plans: TBD ?Care Coordinator Anticipated Follow Up Needs: HH/OP ?Expected length of stay: 5-10 days ? ?Clinical Impression ?SW met with pt  in room to introduce self, explain role, and discuss discharge process. Pt is not a veteran, however, husband was a English as a second language teacher. DME: walker (no wheels) but did not use. Pt states primary contact should be her son Lauren Conner who is POA. ? ?1110- SW spoke with pt son Lauren Conner to introduce self, explain role, and discuss discharge process. He is aware there  will be follow-up after team conference tomorrow. He wanted to know more about the plan with the cath she has. SW informed will discuss in team conference tomorrow, and f/u. He also states to speak with his brother Lauren Conner to discuss fam edu, since he and his wife Lauren Conner will be primarily helping.  ? ?30- SW spoke with pt son Lauren Conner and his wife Lauren Conner to introduce self, explain role, and discuss discharge process. SW discussed d/c process, and how services are put in place. Confirmed he and his wife will be primary caregivers and they will be staying with her. Report various family/friend support and are working on a schedule. SW clarified insurance does not cover an aide to be in the home for a period of time. SW suggested a Actuary and to consider what VA services may be available. Fam edu on Wed 8am-12pm. SW emailed ([email or web address]) sitter list, home measurement form, and contact information for VA to explore any potential benefits. SW will f/u after team conference.  ? ?08-17-1982 ?07/28/2021, 12:39 PM ? ?  ?

## 2021-07-28 NOTE — Progress Notes (Addendum)
?                                                       PROGRESS NOTE ? ? ?Subjective/Complaints: ? ?Mild right sided headache. Better with tylenol. Has to break tylenol up into pieces to swallow. ?C/o constipation. Is eating. Asked if foley could come out ? ?ROS: Patient denies fever, rash, sore throat, blurred vision, dizziness, nausea, vomiting, diarrhea, cough, shortness of breath or chest pain, joint or back/neck pain,   or mood change.  ? ? ?Objective: ?  ?No results found. ?Recent Labs  ?  07/26/21 ?0153 07/28/21 ?7564  ?WBC 7.5 5.6  ?HGB 10.9* 11.1*  ?HCT 31.8* 32.5*  ?PLT 226 220  ? ?Recent Labs  ?  07/26/21 ?0153 07/28/21 ?3329  ?NA 136 137  ?K 4.2 4.3  ?CL 110 105  ?CO2 19* 22  ?GLUCOSE 82 79  ?BUN 32* 26*  ?CREATININE 2.47* 2.53*  ?CALCIUM 8.1* 8.2*  ? ? ?Intake/Output Summary (Last 24 hours) at 07/28/2021 1013 ?Last data filed at 07/27/2021 2346 ?Gross per 24 hour  ?Intake 300 ml  ?Output 1175 ml  ?Net -875 ml  ?  ? ?  ? ?Physical Exam: ?Vital Signs ?Blood pressure (!) 151/86, pulse 96, temperature 97.7 ?F (36.5 ?C), temperature source Oral, resp. rate 16, height 5\' 3"  (1.6 m), weight 55.6 kg, SpO2 99 %. ? ? ?Constitutional: No distress . Vital signs reviewed. ?HEENT: NCAT, EOMI, oral membranes moist ?Neck: supple ?Cardiovascular: RRR without murmur. No JVD    ?Respiratory/Chest: CTA Bilaterally without wheezes or rales. Normal effort    ?GI/Abdomen: BS +, non-tender, non-distended ?Ext: no clubbing, cyanosis, or edema ?Psych: pleasant and cooperative  ?Skin: No evidence of breakdown, no evidence of rash ?Neurologic: Alert and oriented x 3. Normal insight and awareness. Intact Memory. Normal language and speech. Cranial nerve exam unremarkable motor strength is 5/5 in bilateral deltoid, bicep, tricep, grip, hip flexor, knee extensors, ankle dorsiflexor and plantar flexor. Sensory exam normal for light touch and pain in all 4 limbs. No limb ataxia or cerebellar signs. No abnormal tone appreciated.  .  Better attention to left noted ?Musculoskeletal: Full range of motion in all 4 extremities. No joint swelling ? ? ?Assessment/Plan: ?1. Functional deficits which require 3+ hours per day of interdisciplinary therapy in a comprehensive inpatient rehab setting. ?Physiatrist is providing close team supervision and 24 hour management of active medical problems listed below. ?Physiatrist and rehab team continue to assess barriers to discharge/monitor patient progress toward functional and medical goals ? ?Care Tool: ? ?Bathing ?   ?Body parts bathed by patient: Right arm, Left arm, Chest, Abdomen, Front perineal area, Buttocks, Face, Left lower leg, Right lower leg, Left upper leg, Right upper leg  ?   ?  ?  ?Bathing assist Assist Level: Minimal Assistance - Patient > 75% (secondary to cather placement and manevering to prevent displacement) ?  ?  ?Upper Body Dressing/Undressing ?Upper body dressing   ?What is the patient wearing?: Pull over shirt ?   ?Upper body assist Assist Level: Minimal Assistance - Patient > 75% ?   ?Lower Body Dressing/Undressing ?Lower body dressing ? ? ?   ?What is the patient wearing?: Pants ? ?  ? ?Lower body assist Assist for lower body dressing: Minimal Assistance - Patient > 75% ?   ? ?  Toileting ?Toileting Toileting Activity did not occur (Probation officer and hygiene only): N/A (no void or bm)  ?Toileting assist Assist for toileting: Minimal Assistance - Patient > 75% (Patient has cather and was able to manage the area secondary to placement.) ?  ?  ?Transfers ?Chair/bed transfer ? ?Transfers assist ?   ? ?Chair/bed transfer assist level: Contact Guard/Touching assist ?  ?  ?Locomotion ?Ambulation ? ? ?Ambulation assist ? ?   ? ?Assist level: Contact Guard/Touching assist ?Assistive device: Walker-rolling ?Max distance: 100 ft  ? ?Walk 10 feet activity ? ? ?Assist ?   ? ?Assist level: Contact Guard/Touching assist ?Assistive device: No Device  ? ?Walk 50 feet activity ? ? ?Assist    ? ?Assist level: Contact Guard/Touching assist ?Assistive device: No Device  ? ? ?Walk 150 feet activity ? ? ?Assist Walk 150 feet activity did not occur: Safety/medical concerns (decreased activity tolerance) ? ?  ?  ?  ? ?Walk 10 feet on uneven surface  ?activity ? ? ?Assist   ? ? ?Assist level: Minimal Assistance - Patient > 75% ?   ? ?Wheelchair ? ? ? ? ?Assist Is the patient using a wheelchair?: Yes ?Type of Wheelchair: Manual ?  ? ?Wheelchair assist level: Minimal Assistance - Patient > 75%, Supervision/Verbal cueing ?Max wheelchair distance: >150 ft  ? ? ?Wheelchair 50 feet with 2 turns activity ? ? ? ?Assist ? ?  ?  ? ? ?Assist Level: Supervision/Verbal cueing  ? ?Wheelchair 150 feet activity  ? ? ? ?Assist ?   ? ? ?Assist Level: Minimal Assistance - Patient > 75%  ? ?Blood pressure (!) 151/86, pulse 96, temperature 97.7 ?F (36.5 ?C), temperature source Oral, resp. rate 16, height 5\' 3"  (1.6 m), weight 55.6 kg, SpO2 99 %. ? ?Medical Problem List and Plan: ?1. 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 ?            -patient may shower ?            -ELOS/Goals: 10-14 days S to mod I ?            -Continue CIR therapies including PT, OT, and SLP  ?2.  Antithrombotics: ?-DVT/anticoagulation:  Mechanical: Antiembolism stockings, thigh (TED hose) Bilateral lower extremities ?            -antiplatelet therapy: N/A ?3. Pain Management: Tylenol as needed--change to liquid form and schedule tid ?4. Mood: Provide emotional support ?            -antipsychotic agents: N/A ?5. Neuropsych: This patient is capable of making decisions on her own behalf. ?6. Skin/Wound Care: Routine skin checks ?7. Fluids/Electrolytes/Nutrition: encourage po ? I personally reviewed the patient's labs today.   ?8.  CAP with baseline bronchiectasis.  Follow-up PCCM.   ?-Complete 14-day course Levaquin initiated 07/23/2021.   ?-Continue inhalers as  directed ?9.  Acute urinary retention with significant bilateral hydronephrosis.  Follow-up 07/23/2021 Dr. Diona Fanti.  Recommend CONTINUE FOLEY CATHETER TUBE  and Flomax for at least 2 weeks and follow-up outpatient.  Follow-up renal ultrasound 07/24/2021 showed resolved bilateral hydronephrosis ? -reviewed plan with pt today.  ?10.  AKI on CKD stage IV.  Patient followed by Dr.Lehrich at [facility] with baseline creatinine 2.0.  Currently seen by Dr. Moshe Cipro while at [facility].  Currently with worsening of renal function suspect related to BP variability with amlodipine and metoprolol discontinued.    ? -  3/13 today's Cr near her current baseline of 2.5 ? -continue to monitor, good urine output ?11.  Hypertension.  Amlodipine and Lopressor currently discontinued.  Continue to monitor ?Vitals:  ? 07/28/21 0745 07/28/21 0800  ?BP:  (!) 151/86  ?Pulse:  96  ?Resp:  16  ?Temp:  97.7 ?F (36.5 ?C)  ?SpO2: 97% 99%  ? ? ?12.  Hypothyroidism: continue Synthroid ?13.  History of cardiac dysfunction with SVT.  Cardiac rate controlled. Monitor HR TID.  ?14.  Anemia of chronic disease.  Transfused 2 units 07/25/2021 patient had been receiving Aranesp every 2 weeks ?  3/13 hgb up to 11.1, stable ?15. Slow transit constipation: ? -sorbitol today ? -continue senna and miralax daily ?  ? ?LOS: ?2 days ?A FACE TO FACE EVALUATION WAS PERFORMED ? ?Meredith Staggers ?07/28/2021, 10:13 AM  ? ? ? ?

## 2021-07-28 NOTE — Progress Notes (Signed)
Occupational Therapy Session Note ? ?Patient Details  ?Name: Lauren Conner ?MRN: [id number] ?Date of Birth: November 29, 1940 ? ?Today's Date: 07/28/2021 ?OT Individual Time: 1105-1200 ?OT Individual Time Calculation (min): 55 min  ? ? ?Short Term Goals: ?Week 1:    ? ?Skilled Therapeutic Interventions/Progress Updates:  ?  1:1 Pt received in the w/c and BP taken - see below. Due to her orthostatic BPs- taken into the BR via w/c. Pt doffed clothing with min A. Pt transferred into shower to tub bench with min A. Pt showered sit to stand all body parts with contact guard. Pt did require cues for attention to left side at times. Pt with difficulty orienting and donning bra requiring A but was able to don shirt (2), underwear and pants with contact guard. Pt still has catheter- assistance for threading it.  ?Pt taken to the sink and was able to perform all grooming with supervision. PT aware of left inattention but still required min questioning cues - pt reports. Left sitting in the w/c at end of session.   ? ?Therapy Documentation ?Precautions:  ?Precautions ?Precautions: Fall ?Precaution Comments: hx of orthostatic BP, L field cut ?Restrictions ?Weight Bearing Restrictions: No ?General: ?  ?Vital Signs: ?At beginning of session 155/91 in sitting ?In standing 135/87 ?At end of shower and dressing; in sitting 103/84 thigh high teds were donned.  ?Pain: ?Pain Assessment ?Pain Scale: 0-10 ?Pain Score: 0-No pain ?ADL: ? ? ?Therapy/Group: Individual Therapy ? ?[name] ?07/28/2021, 12:44 PM ?

## 2021-07-28 NOTE — Care Management (Signed)
Inpatient Rehabilitation Center ?Individual Statement of Services ? ?Patient Name:  Lauren Conner  ?Date:  07/28/2021 ? ?Welcome to the [facility].  Our goal is to provide you with an individualized program based on your diagnosis and situation, designed to meet your specific needs.  With this comprehensive rehabilitation program, you will be expected to participate in at least 3 hours of rehabilitation therapies Monday-Friday, with modified therapy programming on the weekends. ? ?Your rehabilitation program will include the following services:  Physical Therapy (PT), Occupational Therapy (OT), Speech Therapy (ST), 24 hour per day rehabilitation nursing, Therapeutic Recreaction (TR), Psychology, Neuropsychology, Care Coordinator, Rehabilitation Medicine, Nutrition Services, Pharmacy Services, and Other ? ?Weekly team conferences will be held on Tuesdays to discuss your progress.  Your Inpatient Rehabilitation Care Coordinator will talk with you frequently to get your input and to update you on team discussions.  Team conferences with you and your family in attendance may also be held. ? ?Expected length of stay: 5-10 days   ? ?Overall anticipated outcome: Independent with assistive device ? ?Depending on your progress and recovery, your program may change. Your Inpatient Rehabilitation Care Coordinator will coordinate services and will keep you informed of any changes. Your Inpatient Rehabilitation Care Coordinator's name and contact numbers are listed  below. ? ?The following services may also be recommended but are not provided by the [facility]:  ?Driving Evaluations ?Home Health Rehabiltiation Services ?Outpatient Rehabilitation Services ?Vocational Rehabilitation ?  ?Arrangements will be made to provide these services after discharge if needed.  Arrangements include referral to agencies that provide these services. ? ?Your insurance has been verified to be:  Medicare  A/B ? ?Your primary doctor is:  [place] ? ?Pertinent information will be shared with your doctor and your insurance company. ? ?Inpatient Rehabilitation Care Coordinator:  Cathleen Corti [phone] or (C) [phone] ? ?Information discussed with and copy given to patient by: [name], 07/28/2021, 9:54 AM    ?

## 2021-07-28 NOTE — Progress Notes (Signed)
Inpatient Rehabilitation  Patient information reviewed and entered into eRehab system by Mardie Kellen M. Jomo Forand, M.A., CCC/SLP, PPS Coordinator.  Information including medical coding, functional ability and quality indicators will be reviewed and updated through discharge.    

## 2021-07-28 NOTE — Progress Notes (Signed)
PMR Admission Coordinator Pre-Admission Assessment   Patient: Lauren Conner is an 81 y.o., female MRN: [id number] DOB: May 26, 1940 Height:   Weight:     Insurance Information HMO:     PPO:      PCP:      IPA:      80/20:      OTHER:  PRIMARY: Medicare A/B      Policy#: [id number]      Subscriber: pt CM Name:       Phone#:      Fax#:  Pre-Cert#: verified online      Employer:  Benefits:  Phone #:      Name:  Eff. Date: A/B 10/17/06     Deduct: $1600      Out of Pocket Max: n/a      Life Max: n/a CIR: 100%      SNF: 20 full days  Outpatient: 80%     Co-Ins: 20% Home Health: 100%      Co-Pay:  DME: 80%     Co-Ins: 20% Providers: pt choice   SECONDARY: Tricare for Life      Policy#: [id number]     Phone#:    Financial Counselor:       Phone#:    The Data Collection Information Summary for patients in Inpatient Rehabilitation Facilities with attached Privacy Act [place] Records was provided and verbally reviewed with: Patient and Family   Emergency Contact Information Contact Information       Name Relation Home Work Lauren Conner     [phone]    Lauren Conner     [place] Conner     [phone]    Lauren Conner     [phone]           Current Medical History  Patient Admitting Diagnosis: IPH    History of Present Illness: Lauren Conner is a 81 year old right-handed female with history of anemia of chronic disease paroxysmal SVT, hypothyroidism, hypertension, CKD stage IV followed by Dr.Lehrich with nephrology at [facility] with creatinine baseline 2.0.  [facility]ed to [facility]19[facility] after being found down on her left side with altered mental status and left-sided weakness.  Patient was tachycardic 124 with low-grade fever of 100.5 and blood pressure 154/121, oxygen saturations 100% room air.  Cranial CT scan showed large area of acute intraparenchymal hemorrhage within the right temporal parietal lobe 4.0 x 4.0 x 4.1  cm with surrounding vasogenic edema and mild effacement of the occipital horn and atrium of the right lateral ventricle.  Small amount of subarachnoid hemorrhage within [place] at the anterior right temporal lobe.  3 mm of right to left midline shift.  Admission chemistry sodium 126 chloride 97 glucose 157 BUN 41 creatinine 2.13, WBC 17,100, hemoglobin 10.2, lactic acid 3.5 troponin 55-33, blood cultures no growth to date, urinalysis negative nitrite, procalcitonin 0.32, COVID-19 and influenza testing negative.  CT of the chest without contrast showed significant motion artifact bilaterally however notable cylindrical and saccular bronchiectatic changes with chronic bronchitic airway bilaterally.  CT of chest abdomen pelvis showed bilateral hydronephrosis and proximal hydroureter.  MRI/MRA of the head 25 mL intraparenchymal hematoma centered at the right temporal occipital region.  No vascular abnormality seen underlying the right cerebral hemorrhage.  Echocardiogram with ejection fraction of 65 to 70% no wall motion abnormalities grade 1 diastolic dysfunction.  Patient was placed on broad-spectrum intravenous antibiotic for suspected sepsis.  Urology services Dr. Diona Fanti consulted in  regards to urinary retention/significant bilateral hydronephrosis recommended Foley catheter tube and Flomax x2 weeks and follow-up outpatient.  A follow-up renal ultrasound 07/24/2021 revealed no acute abnormality resolved bilateral hydronephrosis.  Renal services Dr. Moshe Cipro consulted in regards to longstanding CKD stage IV with baseline creatinine 2.0 elevating to 2.78 felt to be related to BP variability going from very high to very low normal.  Amlodipine and metoprolol discontinued and lattest creatinine 2.44.  Her antibiotic regimen has been transition to Levaquin 500 mg every 48 hours 07/23/2021 per PCCM x14 days.  RVP negative, blood cultures negative to date.  Anemia of chronic disease A's hemoglobin 6.9 awaiting plan  transfusion.  She is tolerating a regular consistency diet.  Therapy evaluations completed and pt was recommended for CIR.     Complete NIHSS TOTAL: 0   Patient's medical record from [facility] has been reviewed by the rehabilitation admission coordinator and physician.   Past Medical History  No past medical history on file.   Has the patient had major surgery during 100 days prior to admission? No   Family History   family history is not on file.   Current Medications   Current Facility-Administered Medications:    0.9 %  sodium chloride infusion, , Intravenous, Continuous, Rai, Ripudeep K, MD, Last Rate: 100 mL/hr at 07/24/21 0123, New Bag at 07/24/21 0123   acetaminophen (TYLENOL) tablet 650 mg, 650 mg, Oral, Q4H PRN **OR** acetaminophen (TYLENOL) 160 MG/5ML solution 650 mg, 650 mg, Per Tube, Q4H PRN **OR** acetaminophen (TYLENOL) suppository 650 mg, 650 mg, Rectal, Q4H PRN, Tacy Learn, [place], MD   arformoterol (BROVANA) nebulizer solution 15 mcg, 15 mcg, Nebulization, BID, Freda Jackson B, MD, 15 mcg at 07/24/21 0740   Chlorhexidine Gluconate Cloth 2 % PADS 6 each, 6 each, Topical, Daily, Rai, Ripudeep K, MD, 6 each at 07/23/21 1026   levofloxacin (LEVAQUIN) tablet 500 mg, 500 mg, Oral, Q48H, [place], Donalynn Furlong, RPH, 500 mg at 07/23/21 1713   levothyroxine (SYNTHROID) tablet 75 mcg, 75 mcg, Oral, Q0600, Freddi Starr, MD, 75 mcg at 07/24/21 0620   ondansetron (ZOFRAN) injection 4 mg, 4 mg, Intravenous, Q8H PRN, Jacky Kindle, MD, 4 mg at 07/21/21 1857   pantoprazole (PROTONIX) EC tablet 40 mg, 40 mg, Oral, Daily, Rosalin Hawking, MD, 40 mg at 07/24/21 0947   revefenacin (YUPELRI) nebulizer solution 175 mcg, 175 mcg, Nebulization, Daily, Freda Jackson B, MD, 175 mcg at 07/24/21 0740   senna-docusate (Senokot-S) tablet 1 tablet, 1 tablet, Oral, BID, Jacky Kindle, MD, 1 tablet at 07/24/21 0947   sodium bicarbonate tablet 650 mg, 650 mg, Oral, TID, Rai, Ripudeep K, MD, 650 mg at  07/24/21 0947   tamsulosin (FLOMAX) capsule 0.4 mg, 0.4 mg, Oral, Daily, Rai, Ripudeep K, MD, 0.4 mg at 07/24/21 0947   Patients Current Diet:  Diet Order                  Diet regular Room service appropriate? Yes; Fluid consistency: Thin  Diet effective now                         Precautions / Restrictions Precautions Precautions: Fall Precaution Comments: orthostatic BP, L neglect/field cut Restrictions Weight Bearing Restrictions: No    Has the patient had 2 or more falls or a fall with injury in the past year? No   Prior Activity Level Community (5-7x/wk): very active, doing her own yard work, no DME used at baseline, driving  Prior Functional Level Self Care: Did the patient need help bathing, dressing, using the toilet or eating? Independent   Indoor Mobility: Did the patient need assistance with walking from room to room (with or without device)? Independent   Stairs: Did the patient need assistance with internal or external stairs (with or without device)? Independent   Functional Cognition: Did the patient need help planning regular tasks such as shopping or remembering to take medications? Independent   Patient Information Are you of Hispanic, Latino/a,or Spanish origin?: A. No, not of Hispanic, Latino/a, or Spanish origin What is your race?: A. White Do you need or want an interpreter to communicate with a doctor or health care staff?: 0. No   Patient's Response To:  Health Literacy and Transportation Is the patient able to respond to health literacy and transportation needs?: Yes Health Literacy - How often do you need to have someone help you when you read instructions, pamphlets, or other written material from your doctor or pharmacy?: Never In the past 12 months, has lack of transportation kept you from medical appointments or from getting medications?: No In the past 12 months, has lack of transportation kept you from meetings, work, or from getting  things needed for daily living?: No   Development worker, international aid / [place] Devices/Equipment: None Home Equipment: None   Prior Device Use: Indicate devices/aids used by the patient prior to current illness, exacerbation or injury? None of the above   Current Functional Level Cognition   Arousal/Alertness: Awake/alert Overall Cognitive Status: Impaired/Different from baseline Current Attention Level: Sustained Orientation Level: Oriented X4 Following Commands: Follows one step commands with increased time Safety/Judgement: Decreased awareness of safety, Decreased awareness of deficits (L neglect) General Comments: pt initially with severe L neglect however with education and instruction to turn head to the L and follow with eyes pt demo'd some carry over Attention: Sustained Sustained Attention: Appears intact (suspect she will breakdown in function) Memory:  (TBA) Awareness: Impaired Awareness Impairment: Intellectual impairment, Anticipatory impairment Problem Solving:  (very basic intact) Safety/Judgment: Impaired    Extremity Assessment (includes Sensation/Coordination)   Upper Extremity Assessment: Overall WFL for tasks assessed  Lower Extremity Assessment: Defer to PT evaluation     ADLs   Overall ADL's : Needs assistance/impaired Grooming: Wash/dry hands, Wash/dry face, Set up, Sitting Upper Body Bathing: Min guard, Sitting Lower Body Bathing: Moderate assistance, Sit to/from stand Upper Body Dressing : Minimal assistance, Sitting Lower Body Dressing: Moderate assistance, Sit to/from stand Toilet Transfer: Minimal assistance, BSC/3in1, Stand-pivot     Mobility   Overal bed mobility: Needs Assistance Bed Mobility: Supine to Sit Rolling: Min assist Sidelying to sit: Min assist Sit to supine: Max assist (due to pt becoming orthostatic) General bed mobility comments: pt requiring minA and max verbal and tactile cues when rolling to the L as pt with L  neglect and difficulty finding the rail, modA for trunk elevation due to inability to push up with L UE due to inattention requiring max tactile cues, maxA to return to supine due to drop in bp     Transfers   Overall transfer level: Needs assistance Transfers: Sit to/from Stand, Bed to chair/wheelchair/BSC Sit to Stand: Min assist Bed to/from chair/wheelchair/BSC transfer type:: Step pivot Step pivot transfers: Min assist General transfer comment: continues with orthostasis 111/66 lying, 111/68 sitting, and 74/58 standing     Ambulation / Gait / Stairs / Wheelchair Mobility   Ambulation/Gait General Gait Details: unable this date  Posture / Balance Dynamic Sitting Balance Sitting balance - Comments: pt initially close min guard, with onset of dizziness pt leaning to the R onto therapist Balance Overall balance assessment: Needs assistance Sitting-balance support: Bilateral upper extremity supported, Feet supported Sitting balance-Leahy Scale: Fair Sitting balance - Comments: pt initially close min guard, with onset of dizziness pt leaning to the R onto therapist Standing balance support: Bilateral upper extremity supported Standing balance-Leahy Scale: Poor     Special needs/care consideration N/a    Previous Home Environment (from acute therapy documentation) Living Arrangements: Alone  Lives With: Alone Available Help at Discharge: Family, Available 24 hours/day Type of Home: House Home Layout: One level Home Access: Stairs to enter Entrance Stairs-Rails: None Entrance Stairs-Number of Steps: 2 Bathroom Shower/Tub: Multimedia programmer: Standard Bathroom Accessibility: Yes How Accessible: Accessible via walker [place]: No   Discharge Living Setting Plans for Discharge Living Setting: Patient's home, Lives with (comment) (will either have family stay with her or go stay with family (set up for her home)) Type of Home at Discharge: House Discharge  Home Layout: One level Discharge Home Access: Stairs to enter Entrance Stairs-Rails: None Entrance Stairs-Number of Steps: 2 Discharge Bathroom Shower/Tub: Walk-in shower Discharge Bathroom Toilet: Standard Discharge Bathroom Accessibility: Yes How Accessible: Accessible via walker Does the patient have any problems obtaining your medications?: No   Social/Family/Support Systems Anticipated Caregiver: family (sons, neices, nephews, etc) Anticipated Caregiver's Contact Information: [name] (Conner) [phone]; [name] (Conner) [phone] Ability/Limitations of Caregiver: n/a Caregiver Availability: 24/7 Discharge Plan Discussed with Primary Caregiver: Yes Is Caregiver In Agreement with Plan?: Yes Does Caregiver/Family have Issues with Lodging/Transportation while Pt is in Rehab?: No   Goals Patient/Family Goal for Rehab: PT/OT/SLP supervision to mod I Expected length of stay: 8-11 days Pt/Family Agrees to Admission and willing to participate: Yes Program Orientation Provided & Reviewed with Pt/Caregiver Including Roles  & Responsibilities: Yes   Decrease burden of Care through IP rehab admission: n/a   Possible need for SNF placement upon discharge: n/a   Patient Condition: I have reviewed medical records from [facility], spoken with CSW, and patient and family member. I met with patient at the bedside for inpatient rehabilitation assessment.  Patient will benefit from ongoing PT, OT, and SLP, can actively participate in 3 hours of therapy a day 5 days of the week, and can make measurable gains during the admission.  Patient will also benefit from the coordinated team approach during an Inpatient Acute Rehabilitation admission.  The patient will receive intensive therapy as well as Rehabilitation physician, nursing, social worker, and care management interventions.  Due to bladder management, bowel management, safety, skin/wound care, disease management, medication administration, pain  management, and patient education the patient requires 24 hour a day rehabilitation nursing.  The patient is currently min assist with mobility and basic ADLs.  Discharge setting and therapy post discharge at home with home health is anticipated.  Patient has agreed to participate in the Acute Inpatient Rehabilitation Program and will admit Saturday 3/11.   Preadmission Screen Completed By:  Michel Santee, PT, DPT 07/24/2021 10:54 AM ______________________________________________________________________   Discussed status with Dr. Naaman Plummer on 07/25/21  at 11:23 AM  and received approval for admission today.   Admission Coordinator:  Michel Santee, PT, DPT time 11:23 AM Sudie Grumbling 07/25/21     Assessment/Plan: Diagnosis: IPH Does the need for close, 24 hr/day Medical supervision in concert with the patient's rehab needs make it unreasonable for this patient  to be served in a less intensive setting? Yes Co-Morbidities requiring supervision/potential complications: htn, ckd iv, PSVT Due to bladder management, bowel management, safety, skin/wound care, disease management, medication administration, pain management, and patient education, does the patient require 24 hr/day rehab nursing? Yes Does the patient require coordinated care of a physician, rehab nurse, PT, OT, and SLP to address physical and functional deficits in the context of the above medical diagnosis(es)? Yes Addressing deficits in the following areas: balance, endurance, locomotion, strength, transferring, bowel/bladder control, bathing, dressing, feeding, grooming, toileting, cognition, speech, and psychosocial support Can the patient actively participate in an intensive therapy program of at least 3 hrs of therapy 5 days a week? Yes The potential for patient to make measurable gains while on inpatient rehab is excellent Anticipated functional outcomes upon discharge from inpatient rehab: modified independent and supervision PT, modified  independent and supervision OT, modified independent and supervision SLP Estimated rehab length of stay to reach the above functional goals is: 8-11 days Anticipated discharge destination: Home 10. Overall Rehab/Functional Prognosis: excellent     MD Signature: Meredith Staggers, MD, [place] Director Rehabilitation Services 07/26/2021

## 2021-07-28 NOTE — Progress Notes (Signed)
Physical Therapy TBI Note ? ?Patient Details  ?Name: Lauren Conner ?MRN: [id number] ?Date of Birth: 09/04/1940 ? ?Today's Date: 07/28/2021 ?PT Individual Time: 2641-5830 ?PT Individual Time Calculation (min): 54 min  ? ?Short Term Goals: ?Week 1:  PT Short Term Goal 1 (Week 1): STG=LTG due to ELOS ? ?Skilled Therapeutic Interventions/Progress Updates:  ?   ?Pt received semi reclined in bed and agrees to therapy. No complaint of pain. PT provides totalA to don bilateral knee-high TED hose. Pt's BP assessed in supine 158/89. Supine to sit slowly with verbal cues for positioning. BP taken in sitting 161/89. Pt stands with CGA and cues for initiation. In standing, pt's BP 118/64. Pt verbalizes dizziness and feels that L leg is getting "shaky", so takes seated rest break. Once symptoms resolve after 1-2 minutes, pt performs stand step transfer to [facility] with CGA. WC transport to gym. Stand step to Nustep with CGA. Pt completes nustep for strength and endurance trianing. Pt completes total of 10:00 on workload of 4 with cue to maintain steps per minute >40. When pt is unable to maintain rate, cued to take rest break. Pt requires 6-7 brief rest breaks to complete total of 10:00 of activity. Following, pt's BP assessed 133/84. Pt stands and BP taken 98/64. Pt requires seated rest break and is symptomatic, reporting significant light headedness and having difficulty keeping eyes open. PT encourages pt to sit in [facility] with legs dependent until next session to acclimate CV system to gravity. Pt agreeable. Left seated with alarm intact and all needs within reach. ? ?2nd Session: Pt received supine in bed. Requests to defer therapy at this time due to upset stomach and diarrhea. PT will follow up as appropriate. ? ?Therapy Documentation ?Precautions:  ?Precautions ?Precautions: Fall ?Precaution Comments: hx of orthostatic BP, L field cut ?Restrictions ?Weight Bearing Restrictions: No ? ?Agitated Behavior Scale: ?TBI ?Observation  Details ?Observation Environment: Pt's Room ?Start of observation period - Date: 07/28/21 ?Start of observation period - Time: 0800 ?End of observation period - Date: 07/28/21 ?End of observation period - Time: 0900 ?Agitated Behavior Scale (DO NOT LEAVE BLANKS) ?Short attention span, easy distractibility, inability to concentrate: Absent ?Impulsive, impatient, low tolerance for pain or frustration: Absent ?Uncooperative, resistant to care, demanding: Absent ?Violent and/or threatening violence toward people or property: Absent ?Explosive and/or unpredictable anger: Absent ?Rocking, rubbing, moaning, or other self-stimulating behavior: Absent ?Pulling at tubes, restraints, etc.: Absent ?Wandering from treatment areas: Absent ?Restlessness, pacing, excessive movement: Absent ?Repetitive behaviors, motor, and/or verbal: Absent ?Rapid, loud, or excessive talking: Absent ?Sudden changes of mood: Absent ?Easily initiated or excessive crying and/or laughter: Absent ?Self-abusiveness, physical and/or verbal: Absent ?Agitated behavior scale total score: 14 ? ? ?Therapy/Group: Individual Therapy ? ?Breck Coons, PT, DPT ?07/28/2021, 3:22 PM  ?

## 2021-07-29 NOTE — Patient Care Conference (Addendum)
Inpatient RehabilitationTeam Conference and Plan of Care Update ?Date: 07/29/2021   Time: 10:31 AM  ? ? ?Patient Name: Lauren Conner      ?Medical Record Number: [id number]  ?Date of Birth: Jan 21, 1941 ?Sex: Female         ?Room/Bed: 4M02C/4M02C-01 ?Payor Info: Payor: MEDICARE / Plan: MEDICARE PART A AND B / Product Type: *No Product type* /   ? ?Admit Date/Time:  07/26/2021  2:21 PM ? ?Primary Diagnosis:  Intraparenchymal hemorrhage of brain (HCC) ? ?Hospital Problems: Principal Problem: ?  Intraparenchymal hemorrhage of brain ([place]) ? ? ? ?Expected Discharge Date: Expected Discharge Date: 08/05/21 ? ?Team Members Present: ?Physician leading conference: Dr. Alger Simons ?Social Worker Present: Loralee Pacas, LCSWA ?Nurse Present: Dorthula Nettles, RN ?PT Present: Apolinar Junes, PT ?OT Present: Laverle Hobby, OT ?SLP Present: Weston Anna, SLP ?PPS Coordinator present : Gunnar Fusi, SLP ? ?   Current Status/Progress Goal Weekly Team Focus  ?Bowel/Bladder ? ? Pt is contient of BM, LBM is 07/28/21- pt has Foley in place r/t urinary retention.  Pt will remain continent x2  Q2h toileting, Foley removal trial   ?Swallow/Nutrition/ Hydration ? ?           ?ADL's ? ? contact guard, cues for visual attention to the left, functional mobility can be limited by BP issues (orthostatic), showers with contact guard, dresses with min A  supervision due to BP issues  fam edu functional mobility, aDL retraining activity tolerance, visual attention to the left   ?Mobility ? ? supervision bed mobility, CGA sit to stand and gait up to 100' without AD, 8 steps without AD. Symptomatic orthostatic hypotension limiting ability to participate.  Supervision  Increase OOB activity, strength, balance, gait without AD, endurance   ?Communication ? ?           ?Safety/Cognition/ Behavioral Observations ? Min A  Mod I  complex problem solving and short-term recall   ?Pain ? ? Pt c/o generalized "aches and pains"- Scheduled tylenol effective  per pt.  Pt will be free of pain  Assess pain qshift   ?Skin ? ? No obvious signs of skin breakdown at this time  prevent from breakdown or infections  Assess skin qshift   ? ? ?Discharge Planning:  ?Pt will d/c to her home and her son [name] and his wife [name] will stay with patient. They are continuing to explore all family that will be helping, and are not concerned about the support she will have.   ?Team Discussion: ?Right parietal ICH, SAH. Likely d/t HTN hemorrhage. Will add abdominal binder.history of PNA. Gave Sorbitol for constipation. Orthostasis causing weakness. Will discharge with Foley in place, follow-up with Urology. No pain reported, sleeping well. Scattered bruising. Discharging home with spouse, family education schedule for Wednesday. ? ?Patient on target to meet rehab goals: ?yes, supervision to mod I goals. Currently CGA overall. ? ?*See Care Plan and progress notes for long and short-term goals.  ? ?Revisions to Treatment Plan:  ?Adding abdominal binder, monitor blood pressure ?  ?Teaching Needs: ?Family education, medication management, skin/wound care, transfer/gait training, etc. ?  ?Current Barriers to Discharge: ?Decreased caregiver support, Lack of/limited family support, and urinary retention, orthostasis. ? ?Possible Resolutions to Barriers: ?Family education ?Follow-up with Urology ?Follow-up [facility] or Outpatient therapy ?Order recommended DME ?Will change Foley at discharge  ? ? Medical Summary ?Current Status: right parietal ICH,SAH. urine retention and hydronephrosis, now with foley. constipated, CKD IV ? Barriers to Discharge: Medical stability ?  Barriers to Discharge Comments: urine retention, orthostasis ?Possible Resolutions to [employer]: daily labs and assessment of pt data, binder for pressure support ? ? ?Continued Need for Acute Rehabilitation Level of Care: The patient requires daily medical management by a physician with specialized training in physical medicine  and rehabilitation for the following reasons: ?Direction of a multidisciplinary physical rehabilitation program to maximize functional independence : Yes ?Medical management of patient stability for increased activity during participation in an intensive rehabilitation regime.: Yes ?Analysis of laboratory values and/or radiology reports with any subsequent need for medication adjustment and/or medical intervention. : Yes ? ? ?I attest that I was present, lead the team conference, and concur with the assessment and plan of the team. ? ? ?Dorthula Nettles G ?07/29/2021, 2:45 PM  ? ? ? ? ? ? ?

## 2021-07-29 NOTE — Progress Notes (Signed)
Occupational Therapy Session Note ? ?Patient Details  ?Name: Lauren Conner ?MRN: [id number] ?Date of Birth: 1940/09/11 ? ?Today's Date: 07/29/2021 ?OT Individual Time: 0900-1000 ?OT Individual Time Calculation (min): 60 min  ? ? ?Short Term Goals: ?Week 1:    ? ?Skilled Therapeutic Interventions/Progress Updates:  ?  1:1 Pt received in the bed and was getting her breathing treatment. Pt's BP 157/87 in sitting EOB without TEDS. In Standing 119/75 without TEDS HR 111. Pt does report symptoms. After sitting down and donning TEDS BP 140/79 in sitting . Pt already dressed for the day and was able to don shoes with setup. Pt ambulated from EOB to the nursing station without AD with contact guard  60 feet BP was 103/63 in standing HR 122.  After seated rest break  BP 124/78.  Taken via w/c into the ADL apartment to participate in function mobility to make the bed without use of AD. Pt did request to sit based on how she was feeling - BP 116/73. Did discuss how important it was to respond to how she feels ot avoid a fall and why supervision is recommended. After seated rest break in the recliner (119/72). Pt ambulated contact guard to the gym to w/c; another ~60 feet. BP dropped significantly to 87/62. After 5 min of seated break break BP 120/76.  Discussed with MD about ordering a abdominal binder for when she is up on her feet.  ? ?Bilaterally propelled w/c with LEs for continued strengthening of LEs. (Without standing). Pt did required mod cues to attend to turning left in the hallways to locate her room.  ? ?Left sitting in the w/c with seat alarm on with call bell.  ? ?Therapy Documentation ?Precautions:  ?Precautions ?Precautions: Fall ?Precaution Comments: hx of orthostatic BP, L field cut ?Restrictions ?Weight Bearing Restrictions: No ?General: ?  ?Vital Signs: ? ?Pain: ?Pain Assessment ?Pain Scale: 0-10 ?Pain Score: 0-No pain ? ? ? ?Therapy/Group: Individual Therapy ? ?[name] ?07/29/2021, 8:16 AM ?

## 2021-07-29 NOTE — Progress Notes (Signed)
Physical Therapy Session Note ? ?Patient Details  ?Name: Lauren Conner ?MRN: [id number] ?Date of Birth: 08/20/1940 ? ?Today's Date: 07/29/2021 ?PT Individual Time: 4098-1191 ?PT Individual Time Calculation (min): 42 min  ? ?Short Term Goals: ?Week 1:  PT Short Term Goal 1 (Week 1): STG=LTG due to ELOS ? ?Skilled Therapeutic Interventions/Progress Updates:  ?  Pt received supine in bed and agreeable to therapy session. Pt already wearing thigh high TED hose and abdominal binder. Supine>sitting R EOB, HOB partially elevated, with supervision. Sitting EOB, donned tennis shoes with set-up assist. L stand pivot EOB>w/c, no AD, with CGA. Pt denies any symptoms of orthostatic hypotension at this time. Transported to/from gym in w/c for time management and energy conservation.  ? ?Sitting in w/c vitals: BP 114/68 (MAP 81), HR 81bpm  ?Therapist educated pt on increasing fluid intake (consumed 76fl oz during session) and reinforced education on importance of acclimating to upright posture.  ? ?Gait training ~34ft, no AD, with CGA for safety - achieves reciprocal stepping pattern with adequate foot clearance bilaterally, but decreased gait speed - pt reports feeling 4/10 lightheadedness therefore provided seated break to assess vitals: BP 112/68 (MAP 82), HR 97bpm  ? ?Dynamic gait training using agility ladder via forward reciprocal stepping to challenge longer step lengths and increased foot clearance - pt tends to favor step-to pattern leading with R LE but improves with verbal and tactile cuing. Pt requests to sit rating lightheadedness as 3/10 - vitals: BP 111/70 (MAP 83), HR 89bpm  ? ?Dynamic gait training using agility ladder via side stepping x2 - pt with difficulty achieving large enough step to get both feet in each square, but improved on 2nd trial - light min assist for balance - requires seated rest break between each bout - rates lightheadedness as 4/10 therefore did not reassess vitals as they have trended stable when  pt reporting that degree of lightheadedness. ? ?Dynamic gait through agility ladder via backwards step-to leading with each LE and heavier min assist for balance and pt with increased postural instability.  ? ?Pt requires frequent seated rest breaks throughout session due to feelings of lightheadedness and somewhat due to pt fear/concern.  ? ?Stair navigation training ascending/descending 4 steps x2 (6" height) using B HRs with reciprocal stepping pattern and CGA for steadying.  ? ?Transported back to room and pt agreeable to remain sitting up in w/c to address upright tolerance. Pt left with needs in reach and chair alarm on. ? ? ?Therapy Documentation ?Precautions:  ?Precautions ?Precautions: Fall ?Precaution Comments: hx of orthostatic BP, L field cut ?Restrictions ?Weight Bearing Restrictions: No ? ? ?Pain: ? Denies pain during session. ? ? ?Therapy/Group: Individual Therapy ? ?Tawana Scale , PT, DPT, NCS, CSRS ? ?07/29/2021, 3:10 PM  ?

## 2021-07-29 NOTE — Progress Notes (Signed)
Patient ID: Lauren Conner, female   DOB: [birth year], 81 y.o.   MRN: [id number] ? ?SW went by pt room to provide updates, however, pt sleeping. SW will follow-up to provide updates.  ? ?[place] spoke with pt sons-Eric and [name] to provide updates from team conference, and d/c date 3/21. Confirms family edu tomorrow and informed them to come in 9am-12pm. SW also informed pt will d/c to home with foley catheter and it will be changed prior to discharge with outpatient f/u. SW shared will provide updates once more information.  ? ?Loralee Pacas, MSW, LCSWA ?Office: [phone] ?Cell: [phone] ?Fax: [phone]  ?

## 2021-07-29 NOTE — Progress Notes (Signed)
Speech Language Pathology Daily Session Note ? ?Patient Details  ?Name: PELAGIA IACOBUCCI ?MRN: [id number] ?Date of Birth: 1941/05/11 ? ?Today's Date: 07/29/2021 ?SLP Individual Time: 2841-3244 ?SLP Individual Time Calculation (min): 55 min ? ?Short Term Goals: ?Week 1: SLP Short Term Goal 1 (Week 1): STG=LTG due to ELOS ? ?Skilled Therapeutic Interventions: ?Pt seen for skilled ST with focus on cognitive goals, pt in bed and pleasantly agreeable to all therapeutic tasks. SLP facilitating simple money counting activity by providing mod A cues and significant extra time for 30% accuracy. Impairments in focused and sustained attention impacting pt ability to recall prompts, attention to task average <30 seconds . Pt attempting "Solving Daily Math Problems" portion of ALFA however unable to correctly answer questions attempted therefore activity discontinued. Pt expressing frustration with poor performance stating "I used to be so good with numbers" but demonstrates good awareness of errors, unable to self-correct without mod-max A. Discussed how these changes will impact daily living tasks at home and recommendations for sons to assist with money management, etc. Pt agreeable, sons very involved and supportive. Pt demonstrates strength in recall and memory throughout session, able to recall functional information within and between treatment sessions (MD in and out and patient independently able to recall 3 topics discussed yesterday). Pt left in bed with alarm set and all needs within reach. Cont ST POC.  ? ?Pain ?Pain Assessment ?Pain Scale: 0-10 ?Pain Score: 0-No pain ? ?Therapy/Group: Individual Therapy ? ?Dewaine Conger ?07/29/2021, 8:56 AM ?

## 2021-07-29 NOTE — IPOC Note (Addendum)
Overall Plan of Care (IPOC) ?Patient Details ?Name: Lauren Conner ?MRN: [id number] ?DOB: May 17, 1941 ? 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 ?Hospital Problems: Principal Problem: ?  Intraparenchymal hemorrhage of brain ([place]) ? ? ? ? Functional Problem List: ?Nursing Bladder, Bowel, Endurance, Motor, Perception, Safety  ?PT Balance, Perception, Safety, Behavior, Edema, Sensory, Skin Integrity, Endurance, Motor, Nutrition, Pain  ?OT Endurance, Motor  ?SLP Cognition  ?TR    ?    ? Basic ADL?s: ?OT Other (comment) (Challenges in IADL related task performance for standing long periods at a time safely)  ? ?  Advanced  ADL?s: ?OT Full Meal Preparation, Laundry  ?   ?Transfers: ?PT Bed Mobility, Bed to Chair, Car, Furniture  ?OT    ? ?  Locomotion: ?PT Ambulation, Wheelchair Mobility, Stairs  ? ?  Additional Impairments: ?OT    ?SLP Social Cognition ?  ?Problem Solving, Memory  ?TR    ? ? ?Anticipated Outcomes ?Item Anticipated Outcome  ?Self Feeding  N/a  ?Swallowing ?   ?  ?Basic self-care ? supervision  ?Toileting ?  supervision ?  ?Bathroom Transfers  Supervision   ?Bowel/Bladder ? supervision  ?Transfers ? mod I using LRAD  ?Locomotion ? Mod I using LRAD household distances, supervision community level  ?Communication ?    ?Cognition ? mod I  ?Pain ? n/a  ?Safety/Judgment ? supervision  ? ?Therapy Plan: ?PT Intensity: Minimum of 1-2 x/day ,45 to 90 minutes ?PT Frequency: 5 out of 7 days ?PT Duration Estimated Length of Stay: 5-7 days ?OT Intensity: Minimum of 1-2 x/day, 45 to 90 minutes ?OT Frequency: 5 out of 7 days ?OT Duration/Estimated Length of Stay: 7-10days ?SLP Intensity: Minumum of 1-2 x/day, 30 to 90 minutes ?SLP Frequency: 3 to 5 out of 7 days ?SLP Duration/Estimated Length of Stay: 5-7 days  ? ?Due to the current state of emergency, patients may not be receiving their 3-hours of Medicare-mandated therapy. ? ? Team Interventions: ?Nursing Interventions Patient/Family Education,  Bladder Management, Bowel Management, Disease Management/Prevention, Discharge Planning  ?PT interventions Ambulation/gait training, Cognitive remediation/compensation, Discharge planning, DME/adaptive equipment instruction, Functional mobility training, Pain management, Psychosocial support, Splinting/orthotics, Therapeutic Activities, UE/LE Strength taining/ROM, Visual/perceptual remediation/compensation, Wheelchair propulsion/positioning, UE/LE Coordination activities, Therapeutic Exercise, Stair training, Skin care/wound management, Patient/family education, Neuromuscular re-education, Functional electrical stimulation, Disease management/prevention, Academic librarian, Training and development officer  ?OT Interventions Patient/family education, UE/LE Strength taining/ROM, Therapeutic Exercise, Visual/perceptual remediation/compensation  ?SLP Interventions Cognitive remediation/compensation, Cueing hierarchy, Environmental controls, Functional tasks, Internal/external aids, Patient/family education  ?TR Interventions    ?SW/CM Interventions Discharge Planning, Psychosocial Support, Patient/Family Education  ? ?Barriers to Discharge ?MD  Medical stability  ?Nursing Decreased caregiver support, Incontinence, Lack of/limited family support ?1 level, 2 steps, no rails. Family will provide 24/7 care.  ?PT Home environment access/layout, Neurogenic Bowel & Bladder, Behavior ?2 STE no rails, Foley catheter, decreased STM  ?OT  (Patient would benefit initially from RW to improve safety and independence) ?   ?SLP   ?   ?SW   ?   ? ?Team Discharge Planning: ?Destination: PT-Home ,OT-   home, SLP-Home ?Projected Follow-up: PT-Outpatient PT, OT-  None, SLP-Outpatient SLP ?Projected Equipment Needs: PT-Rolling walker with 5" wheels, OT- Tub/shower bench, Rolling walker with 5" wheels, SLP-None recommended by SLP ?Equipment Details: PT- , OT-RW for static and dynamic balance, and a shower bench for safety and  independence. ?Patient/family involved in discharge planning: PT- Patient,  OT-Patient, SLP-Patient ? ?MD ELOS: 8-12 days ?Medical Rehab Prognosis:  Excellent ?  Assessment: The patient has been admitted for CIR therapies with the diagnosis of right ICH. The team will be addressing functional mobility, strength, stamina, balance, safety, adaptive techniques and equipment, self-care, bowel and bladder mgt, patient and caregiver education, NMR, cognition, communication, community reentry. Goals have been set at mod I to supervision with self-care, mobility and cognition. Anticipated discharge destination is home. ? ?Due to the current state of emergency, patients may not be receiving their 3 hours per day of Medicare-mandated therapy.  ? ? ?Meredith Staggers, MD, FAAPMR   ? ? ?See Team Conference Notes for weekly updates to the plan of care  ?

## 2021-07-29 NOTE — Progress Notes (Signed)
Occupational Therapy Session Note ? ?Patient Details  ?Name: Lauren Conner ?MRN: [id number] ?Date of Birth: [birth year] ? ?Today's Date: 07/29/2021 ?OT Individual Time: 1350-1415 ?OT Individual Time Calculation (min): 25 min  ? ? ?Short Term Goals: ?Week 1:  OT Short Term Goal 1 (Week 1): LTG=STG ? ?Skilled Therapeutic Interventions/Progress Updates:  ?  Pt received supine with no c/o pain, Discussed OH and new abdominal binder present. She came to EOB with (S) and the binder was donned with max A. BP measures below. She completed several bouts of functional mobility, first with the RW with (S) and then CGA without the RW, 133f, 100 ft, 150 ft. She was able to self monitor OH with min cueing. She required several rest breaks seated.  ?Sitting EOB: 143/83 ?After 150 ft of functional mobility: 122/76 ?After rest break and another 100 ft of functional mobility: 115/72.  ?Pt was left sitting EOB with all needs met, bed alarm set. Abdominal binder on.  ? ?Therapy Documentation ?Precautions:  ?Precautions ?Precautions: Fall ?Precaution Comments: hx of orthostatic BP, L field cut ?Restrictions ?Weight Bearing Restrictions: No ? ?Therapy/Group: Individual Therapy ? ?SCurtis Sites?07/29/2021, 2:32 PM ?

## 2021-07-29 NOTE — Progress Notes (Signed)
?                                                       PROGRESS NOTE ? ? ?Subjective/Complaints: ? ?Headache better. Sorbitol made her nauseas but she did have a bm. Therapy reports orthostasis which was symptomatic yesterday.  ? ?ROS: Patient denies fever, rash, sore throat, blurred vision,  nausea, vomiting, diarrhea, cough, shortness of breath or chest pain, joint or back/neck pain, headache, or mood change.  ? ?Objective: ?  ?No results found. ?Recent Labs  ?  07/28/21 ?6213  ?WBC 5.6  ?HGB 11.1*  ?HCT 32.5*  ?PLT 220  ? ?Recent Labs  ?  07/28/21 ?0865  ?NA 137  ?K 4.3  ?CL 105  ?CO2 22  ?GLUCOSE 79  ?BUN 26*  ?CREATININE 2.53*  ?CALCIUM 8.2*  ? ? ?Intake/Output Summary (Last 24 hours) at 07/29/2021 0929 ?Last data filed at 07/29/2021 0710 ?Gross per 24 hour  ?Intake 878 ml  ?Output 1075 ml  ?Net -197 ml  ?  ? ?  ? ?Physical Exam: ?Vital Signs ?Blood pressure (!) 156/99, pulse 86, temperature 98.1 ?F (36.7 ?C), temperature source Oral, resp. rate 19, height 5\' 3"  (1.6 m), weight 55.6 kg, SpO2 97 %. ? ? ?Constitutional: No distress . Vital signs reviewed. ?HEENT: NCAT, EOMI, oral membranes moist ?Neck: supple ?Cardiovascular: RRR without murmur. No JVD    ?Respiratory/Chest: CTA Bilaterally without wheezes or rales. Normal effort    ?GI/Abdomen: BS +, non-tender, non-distended ?Ext: no clubbing, cyanosis, or edema ?Psych: pleasant and cooperative  ?Skin: No evidence of breakdown, no evidence of rash ?Neurologic: Alert and oriented x 3. Normal insight and awareness. Intact Memory. Normal language and speech. Cranial nerve exam unremarkable motor strength is 5/5 in bilateral deltoid, bicep, tricep, grip, hip flexor, knee extensors, ankle dorsiflexor and plantar flexor. Sensory exam normal for light touch and pain in all 4 limbs. No limb ataxia or cerebellar signs. No abnormal tone appreciated.  . Better attention to left noted ?Musculoskeletal: Full range of motion in all 4 extremities. No joint  swelling ? ? ?Assessment/Plan: ?1. Functional deficits which require 3+ hours per day of interdisciplinary therapy in a comprehensive inpatient rehab setting. ?Physiatrist is providing close team supervision and 24 hour management of active medical problems listed below. ?Physiatrist and rehab team continue to assess barriers to discharge/monitor patient progress toward functional and medical goals ? ?Care Tool: ? ?Bathing ?   ?Body parts bathed by patient: Right arm, Left arm, Chest, Abdomen, Front perineal area, Buttocks, Face, Left lower leg, Right lower leg, Left upper leg, Right upper leg  ?   ?  ?  ?Bathing assist Assist Level: Minimal Assistance - Patient > 75% (secondary to cather placement and manevering to prevent displacement) ?  ?  ?Upper Body Dressing/Undressing ?Upper body dressing   ?What is the patient wearing?: Pull over shirt ?   ?Upper body assist Assist Level: Minimal Assistance - Patient > 75% ?   ?Lower Body Dressing/Undressing ?Lower body dressing ? ? ?   ?What is the patient wearing?: Pants ? ?  ? ?Lower body assist Assist for lower body dressing: Minimal Assistance - Patient > 75% ?   ? ?Toileting ?Toileting Toileting Activity did not occur (Probation officer and hygiene only): N/A (no void or bm)  ?Toileting assist Assist  for toileting: Minimal Assistance - Patient > 75% (Patient has cather and was able to manage the area secondary to placement.) ?  ?  ?Transfers ?Chair/bed transfer ? ?Transfers assist ?   ? ?Chair/bed transfer assist level: Contact Guard/Touching assist ?  ?  ?Locomotion ?Ambulation ? ? ?Ambulation assist ? ?   ? ?Assist level: Contact Guard/Touching assist ?Assistive device: Walker-rolling ?Max distance: 100 ft  ? ?Walk 10 feet activity ? ? ?Assist ?   ? ?Assist level: Contact Guard/Touching assist ?Assistive device: No Device  ? ?Walk 50 feet activity ? ? ?Assist   ? ?Assist level: Contact Guard/Touching assist ?Assistive device: No Device  ? ? ?Walk 150 feet  activity ? ? ?Assist Walk 150 feet activity did not occur: Safety/medical concerns (decreased activity tolerance) ? ?  ?  ?  ? ?Walk 10 feet on uneven surface  ?activity ? ? ?Assist   ? ? ?Assist level: Minimal Assistance - Patient > 75% ?   ? ?Wheelchair ? ? ? ? ?Assist Is the patient using a wheelchair?: Yes ?Type of Wheelchair: Manual ?  ? ?Wheelchair assist level: Minimal Assistance - Patient > 75%, Supervision/Verbal cueing ?Max wheelchair distance: >150 ft  ? ? ?Wheelchair 50 feet with 2 turns activity ? ? ? ?Assist ? ?  ?  ? ? ?Assist Level: Supervision/Verbal cueing  ? ?Wheelchair 150 feet activity  ? ? ? ?Assist ?   ? ? ?Assist Level: Minimal Assistance - Patient > 75%  ? ?Blood pressure (!) 156/99, pulse 86, temperature 98.1 ?F (36.7 ?C), temperature source Oral, resp. rate 19, height 5\' 3"  (1.6 m), weight 55.6 kg, SpO2 97 %. ? ?Medical Problem List and Plan: ?1. 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 ?            -patient may shower ?            -ELOS/Goals: 10-14 days S to mod I ?          -Continue CIR therapies including PT, OT, and SLP. Interdisciplinary team conference today to discuss goals, barriers to discharge, and dc planning.    ?2.  Antithrombotics: ?-DVT/anticoagulation:  Mechanical: Antiembolism stockings, thigh (TED hose) Bilateral lower extremities ?            -antiplatelet therapy: N/A ?3. Pain Management: Tylenol as needed--change to liquid form and schedule tid ?4. Mood: Provide emotional support ?            -antipsychotic agents: N/A ?5. Neuropsych: This patient is capable of making decisions on her own behalf. ?6. Skin/Wound Care: Routine skin checks ?7. Fluids/Electrolytes/Nutrition: encourage po ? I have personally reviewed the patient's labs  .   ?8.  CAP with baseline bronchiectasis.  Follow-up PCCM.   ?-Complete 14-day course Levaquin initiated 07/23/2021.   ?-Continue inhalers as  directed ?9.  Acute urinary retention with significant bilateral hydronephrosis.  Follow-up 07/23/2021 Dr. Diona Fanti.  Recommend CONTINUE FOLEY CATHETER TUBE  and Flomax for at least 2 weeks and follow-up outpatient.  Follow-up renal ultrasound 07/24/2021 showed resolved bilateral hydronephrosis ? -reviewed plan with pt today.  ?10.  AKI on CKD stage IV.  Patient followed by Dr[facility]Lehrich at [facility] with baseline creatinine 2.0.  Currently seen by Dr. Moshe Cipro while at [facility].  Currently with worsening of renal function suspect related to BP variability with amlodipine and metoprolol discontinued.    ? -  Cr near  her current baseline of 2.5 ? -continue to monitor, good urine output ?11.  Hypertension.    Lopressor currently discontinued.   ?Vitals:  ? 07/29/21 0312 07/29/21 0903  ?BP: (!) 156/99   ?Pulse: 86   ?Resp: 19   ?Temp: 98.1 ?F (36.7 ?C)   ?SpO2: 98% 97%  ? -significant orthostasis yesterday---only on small dose norvasc. Also taking flomax----resting bp's still high ? -acclimation, TEDS, abdominal binder ordered ? -encourage adequate fluids ? ?12.  Hypothyroidism: continue Synthroid ?13.  History of cardiac dysfunction with SVT.  Cardiac rate controlled. Monitor HR TID.  ?14.  Anemia of chronic disease.  Transfused 2 units 07/25/2021 patient had been receiving Aranesp every 2 weeks ?  3/13 hgb up to 11.1, stable ?15. Slow transit constipation: ? -sorbitol with results---will not use again d/t nausea associated with it ? -continue senna and miralax daily ?  ? ?LOS: ?3 days ?A FACE TO FACE EVALUATION WAS PERFORMED ? ?Meredith Staggers ?07/29/2021, 9:29 AM  ? ? ? ?

## 2021-07-29 NOTE — Progress Notes (Signed)
Orthopedic Tech Progress Note ?Patient Details:  ?Lauren Conner ?September 09, 1940 ?[id number] ? ?Ortho Devices ?Type of Ortho Device: Abdominal binder ?Ortho Device/Splint Location: STOMACH ?Ortho Device/Splint Interventions: Ordered ?  ?Post Interventions ?Patient Tolerated: Well ?Instructions Provided: Care of device ? ?Janit Pagan ?07/29/2021, 12:22 PM ? ?

## 2021-07-30 MED ORDER — ACETAMINOPHEN 325 MG PO TABS
650.0000 mg | ORAL_TABLET | Freq: Three times a day (TID) | ORAL | Status: DC
  Administered 2021-07-30 – 2021-08-05 (×18): 650 mg via ORAL
  Filled 2021-07-30 (×18): qty 2

## 2021-07-30 MED ORDER — CHLORHEXIDINE GLUCONATE CLOTH 2 % EX PADS
6.0000 | MEDICATED_PAD | Freq: Two times a day (BID) | CUTANEOUS | Status: DC
  Administered 2021-07-30 – 2021-08-05 (×12): 6 via TOPICAL

## 2021-07-30 NOTE — Progress Notes (Signed)
Physical Therapy Session Note ? ?Patient Details  ?Name: Lauren Conner ?MRN: [id number] ?Date of Birth: 1940/10/18 ? ?Today's Date: 07/30/2021 ?PT Individual Time: 0900-1000 ?PT Individual Time Calculation (min): 60 min  ? ?Short Term Goals: ?Week 1:  PT Short Term Goal 1 (Week 1): STG=LTG due to ELOS ? ?Skilled Therapeutic Interventions/Progress Updates:  ?   ?Patient in bed upon PT arrival. Patient alert and agreeable to PT session. Patient denied pain during session. Patient's family arrived 47 min late for family education during session. ? ?Orthostatic Vitals: ?Sitting: BP 146/82, HR 102 (asymptomatic) ?Standing: BP 100/71, HR 111 (mild-mod symptoms) ? ?Patient's son and DIL participated in family education and hands on training for transfers, car transfer, gait, and stair navigation during session. Family demonstrated safe guarding technique throughout. Educated on orthostatic hypotension and management with B thigh high TED hose, abdominal binder, and monitoring for symptoms. Educated on fall risk/prevention, home modifications to prevent falls and promote energy conservation and symptoms management, and activation of emergency services in the event of a fall during session.  ? ?Therapeutic Activity: ?Bed Mobility: Patient performed supine to sit with supervision-mod I in a flat bed without use of bed rails to simulate home set-up.  ?Patient sat EOB before family arrived, doffed her gown and donned a shirt with set-up assist and donned pants with max A for catheter management with education on threading technique. Donned B thigh high TEDs and abdominal binder with mod A providing education on donning technique. Patient able to teach-back purpose and wear schedule for TEDs and binder from therapy session yesterday. Donned shoes with total A for energy/time management.  ?Transfers: Patient performed sit to/from stand from the bed, w/c, and standard arm chair with supervision without an AD throughout session.  Provided verbal cues for forward weight shift and pausing to assess symptoms x1. ?Patient performed a simulated sedan height car transfer with supervision without an AD. Provided cues for safe technique. ? ?Gait Training:  ?Patient ambulated >100 feet without an AD with CGA-close supervision. Ambulated with decreased gait speed, decreased step length and height, narrow BOS, forward trunk lean, and downward head gaze. Provided verbal cues for erect posture, looking ahead, and paced breathing. Patient reported fatigue, but no symptoms of orthostasis following ambulation.  ?Patient ascended/descended 4x6" steps without rails with CGA and HHA on descent. Performed step-to gait pattern throughout. Provided cues for technique and sequencing.  ? ?Patient in w/c with family in the room handed off to [place], [place] for continued family education at end of session. ? ?Therapy Documentation ?Precautions:  ?Precautions ?Precautions: Fall ?Precaution Comments: hx of orthostatic BP, L field cut ?Restrictions ?Weight Bearing Restrictions: No ? ? ? ?Therapy/Group: Individual Therapy ? ?Doreene Burke PT, DPT ? ?07/30/2021, 4:21 PM  ?

## 2021-07-30 NOTE — Progress Notes (Signed)
Speech Language Pathology Daily Session Note ? ?Patient Details  ?Name: TEQUIA WOLMAN ?MRN: [id number] ?Date of Birth: September 03, 1940 ? ?Today's Date: 07/30/2021 ?SLP Individual Time: 1000-1045 ?SLP Individual Time Calculation (min): 45 min ? ?Short Term Goals: ?Week 1: SLP Short Term Goal 1 (Week 1): STG=LTG due to ELOS ? ?Skilled Therapeutic Interventions: Skilled treatment session focused on cognitive goals and family education. SLP facilitated session by providing Min verbal cues for recall of her current medications and their functions. Mod verbal and visual cues such as a visual anchor were utilized to assist in patient self-monitoring and correcting inattention to left visual field. Mod verbal cues were also needed for problem solving throughout task. Patient's family present and educated regarding patient's current cognitive deficits and strategies to utilize at home to maximize attention, problem solving, visual scanning and overall safety. Patient's family verbalized understanding but will require reinforcement of information. Patient left upright in wheelchair with family present. Continue with current plan of care.  ?   ? ?Pain ?No/Denies Pain  ? ?Therapy/Group: Individual Therapy ? ?Zyron Deeley ?07/30/2021, 3:42 PM ?

## 2021-07-30 NOTE — Progress Notes (Signed)
Occupational Therapy Session Note ? ?Patient Details  ?Name: Lauren Conner ?MRN: [id number] ?Date of Birth: 08-08-1940 ? ?Today's Date: 07/30/2021 ?OT Individual Time: 1400-1500 ?OT Individual Time Calculation (min): 60 min  ? ? ?Short Term Goals: ?Week 1:  OT Short Term Goal 1 (Week 1): LTG=STG ? ?Skilled Therapeutic Interventions/Progress Updates:  ?  Pt received supine with her family arriving shortly thereafter. Pt completed bed mobility to EOB with (S). BP EOB: 129/78. Pt completed functional mobility to the therapy gym, 125 ft, and BP was 117/74. Pt reporting fatigue and dizziness following and she was encouraged to take an extended rest break. She completed a dynamic balance and endurance focused activity with carrying a weighted laundry basket. X4 trials of carrying 5-8 lbs in the basket and 40 ft of functional mobility. Edu provided to pt and her son throughout re fall risk reduction, hemodynamic stability and monitoring, and energy conservation. She returned to her room and was left sitting up in the w/c with all needs met, family present.  ? ?Therapy Documentation ?Precautions:  ?Precautions ?Precautions: Fall ?Precaution Comments: hx of orthostatic BP, L field cut ?Restrictions ?Weight Bearing Restrictions: No ? ? ?Therapy/Group: Individual Therapy ? ?Curtis Sites ?07/30/2021, 6:31 AM ?

## 2021-07-30 NOTE — Progress Notes (Signed)
Occupational Therapy Session Note ? ?Patient Details  ?Name: Lauren Conner ?MRN: [id number] ?Date of Birth: December 14, 1940 ? ?Today's Date: 07/30/2021 ?OT Individual Time: 8811-0315 ?OT Individual Time Calculation (min): 70 min  ? ? ?Short Term Goals: ?Week 1:  OT Short Term Goal 1 (Week 1): LTG=STG ? ?Skilled Therapeutic Interventions/Progress Updates:  ?  1:1 Pt received in the w/c with pt's fam present (pt's son, daughter in law and niece) for fam education. Education and discussion on functional activities/ ADLs in response to blood pressure issues (orthostatic BP), recommendations for taking BPs at home for information to give at follow up appointments. Also discussion about use of a leg cathter bag for home. Pt with BP of 128/80 with Teds and abdominal binder donned. Discussed dressing and undressing in the bathroom in a chair for safety. Pt showered with contact guard for standing. Pt required frequent rest breaks throughout session. BP before putting on pants was 98/62. Pt does continue to demonstrate inattention to left side requiring mod cues in ADL tasks. Pt also with difficulty with fastening bra in the front and turning it around for easier donning (unable to fasten behind her back). Pt continues to require contact guard for balance.  ? ?Pt left sitting in the w/c with lunch BP taken again and was 107/66. ? ?Therapy Documentation ?Precautions:  ?Precautions ?Precautions: Fall ?Precaution Comments: hx of orthostatic BP, L field cut ?Restrictions ?Weight Bearing Restrictions: No ?General: ?  ?Vital Signs: ?Therapy Vitals ?Temp: 98.5 ?F (36.9 ?C) ?Pulse Rate: 99 ?Resp: 16 ?BP: 111/65 ?Patient Position (if appropriate): Sitting ?Oxygen Therapy ?SpO2: 98 % ?O2 Device: Room Air ?Pain: ?  ?No co pain in session  ? ?Therapy/Group: Individual Therapy ? ?[name] ?07/30/2021, 1:50 PM ?

## 2021-07-31 MED ORDER — LORATADINE 10 MG PO TABS
10.0000 mg | ORAL_TABLET | Freq: Every day | ORAL | Status: DC
Start: 2021-07-31 — End: 2021-08-05
  Administered 2021-07-31 – 2021-08-05 (×6): 10 mg via ORAL
  Filled 2021-07-31 (×6): qty 1

## 2021-07-31 MED ORDER — MIDODRINE HCL 5 MG PO TABS
2.5000 mg | ORAL_TABLET | Freq: Two times a day (BID) | ORAL | Status: DC
  Administered 2021-07-31 – 2021-08-05 (×10): 2.5 mg via ORAL
  Filled 2021-07-31 (×10): qty 1

## 2021-07-31 MED ORDER — AMLODIPINE BESYLATE 2.5 MG PO TABS
2.5000 mg | ORAL_TABLET | Freq: Every day | ORAL | Status: DC
Start: 2021-07-31 — End: 2021-08-05
  Administered 2021-07-31 – 2021-08-04 (×5): 2.5 mg via ORAL
  Filled 2021-07-31 (×5): qty 1

## 2021-07-31 NOTE — Plan of Care (Signed)
?  Problem: Consults ?Goal: RH STROKE PATIENT EDUCATION ?Outcome: Progressing ?  ?Problem: RH BOWEL ELIMINATION ?Goal: RH STG MANAGE BOWEL WITH ASSISTANCE ?Description: STG Manage Bowel with Supervision Assistance. ?Outcome: Progressing ?Goal: RH STG MANAGE BOWEL W/MEDICATION W/ASSISTANCE ?Description: STG Manage Bowel with Medication with Supervision Assistance. ?Outcome: Progressing ?  ?Problem: RH BLADDER ELIMINATION ?Goal: RH STG MANAGE BLADDER WITH ASSISTANCE ?Description: STG Manage Bladder With Supervision Assistance ?Outcome: Progressing ?Goal: RH STG MANAGE BLADDER WITH MEDICATION WITH ASSISTANCE ?Description: STG Manage Bladder With Medication With Supervision Assistance. ?Outcome: Progressing ?  ?Problem: RH SKIN INTEGRITY ?Goal: RH STG MAINTAIN SKIN INTEGRITY WITH ASSISTANCE ?Description: STG Maintain Skin Integrity With Supervision Assistance. ?Outcome: Progressing ?  ?Problem: RH SAFETY ?Goal: RH STG ADHERE TO SAFETY PRECAUTIONS W/ASSISTANCE/DEVICE ?Description: STG Adhere to Safety Precautions With Cues and Reminders. ?Outcome: Progressing ?Goal: RH STG DECREASED RISK OF FALL WITH ASSISTANCE ?Description: STG Decreased Risk of Fall With Supervision Assistance. ?Outcome: Progressing ?  ?Problem: RH COGNITION-NURSING ?Goal: RH STG ANTICIPATES NEEDS/CALLS FOR ASSIST W/ASSIST/CUES ?Description: STG Anticipates Needs/Calls for Assist With Cues and Reminders. ?Outcome: Progressing ?  ?Problem: RH Vision ?Goal: RH LTG Vision (Specify) ?Outcome: Progressing ?  ?

## 2021-07-31 NOTE — Progress Notes (Signed)
Speech Language Pathology Daily Session Note ? ?Patient Details  ?Name: Lauren Conner ?MRN: [id number] ?Date of Birth: 07-28-1940 ? ?Today's Date: 07/31/2021 ?SLP Individual Time: 7493-5521 ?SLP Individual Time Calculation (min): 45 min ? ?Short Term Goals: ?Week 1: SLP Short Term Goal 1 (Week 1): STG=LTG due to ELOS ? ?Skilled Therapeutic Interventions: Skilled treatment session focused on cognitive goals. Upon arrival, patient was awake and alert in bed and requested to work on medication management again this session. SLP facilitated session by providing extra time and overall Min A verbal cues for error awareness and left visual scanning while organizing a TID pill box. Patient demonstrated significant improvement compared to yesterday's session which patient reports is due to a more quiet environment and not feeling so over stimulated by family. Patient left upright in bed with alarm on and all needs within reach. Continue with current plan of care.  ?   ? ?Pain ?No/Denies Pain ? ?Therapy/Group: Individual Therapy ? ?Tamim Skog ?07/31/2021, 3:28 PM ?

## 2021-07-31 NOTE — Progress Notes (Signed)
Physical Therapy Session Note ? ?Patient Details  ?Name: Lauren Conner ?MRN: [id number] ?Date of Birth: 30-Jul-1940 ? ?Today's Date: 07/31/2021 ?PT Individual Time: 6606-0045 ?PT Individual Time Calculation (min): 41 min  ? ?Short Term Goals: ?Week 1:  PT Short Term Goal 1 (Week 1): STG=LTG due to ELOS ? ?Skilled Therapeutic Interventions/Progress Updates:  ? Pt eager for therapy stating today has been a good day for her "except this morning was a rough start." In regard to BP issues. While pt EOB, d/c planning education in regards to equipment needs and recommendations. Pt not using AD during therapies and functionally at supervision to CGA overall but limited distances due to BP drops. Discussed trial with rollator for community mobility as an option for a place to sit if starts to feel "wobbly" as well as endurance overall vs. A transport chair for family to transport her or have available. Pt eager to trial rollator during this session and demonstrated and educated on use of brakes and safety with using the seat. Pt utilized rollator on unit including various transfers, up/down ramp and over mulched surface to simulate community environment (required CGA to get rollator over threshold safely) and down hallways around 300' feet today with overall supervision otherwise. Occasional cues for attention to L side when navigating close obstacles (doorway, items on L side of hall) but able to locate room and gym when on L side with initial cue/direction. Monitored BP throughout session (see below) and patient remained asymptomatic throughout (stated only time felt a little close to wobbly was after the long gait bout (300') but it hadn't reached the point of needing to sit down eminently yet. End of session set up in w/c to promote upright positioning OOB to await OT for next session.  ? ?Therapy Documentation ?Precautions:  ?Precautions ?Precautions: Fall ?Precaution Comments: hx of orthostatic BP, L field  cut ?Restrictions ?Weight Bearing Restrictions: No ?  ?Vital Signs: ?EOB without AB and asymptomatic:  ? BP = 142/88mmHg; MAP = 98; HR = 95 bpm ?After 90' gait with AB and asymptomatic: ? BP = 134/76 mmHg; MAP = 93; HR = 112 bpm ?After 300' gait with AB and asymptomatic: ? BP = 113/68 mmHg; MAP = 81; HR = 122 bpm ?After 3 min seated break with AB and asymptomatic: ? BP = 111/66 mmHg; MAP = 80;  HR = 101 bpm ?Stood up and performed standing heel raises x 15 reps; ? BP = 90/53 mmHg; MAP = 65; HR = 117 bpm (asymptomatic) ?Upon return to room, 117/64 mmHg; MAP = 88; HR = 111 bpm (asymptomatic) ? ?Pain: ?Denies pain.  ? ?Therapy/Group: Individual Therapy ? ?Allayne Gitelman ?Lars Masson, PT, DPT, CBIS ? ?07/31/2021, 2:09 PM  ?

## 2021-07-31 NOTE — Evaluation (Signed)
Recreational Therapy Assessment and Plan ? ?Patient Details  ?Name: Lauren Conner ?MRN: [id number] ?Date of Birth: 06/05/1940 ?Today's Date: 07/31/2021 ? ?Rehab Potential:  Good ?ELOS:   d/c 3/21 ? ?Assessment ? ?Hospital Problem: Principal Problem: ?  Intraparenchymal hemorrhage of brain ([place]) ?  ?  ?Past Medical History: History reviewed. No pertinent past medical history. ?Past Surgical History: History reviewed. No pertinent surgical history. ?  ?Assessment & Plan ?Clinical Impression: Patient is a 81 y.o. 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  Per chart review patient lives alone.  1 level home 2 steps to enter.  Independent and active prior to admission.  Son and daughter-in-law are planning on moving in with her after discharge to provide assistance as needed.  Presented to [facility] 07/19/2021 after being found down on her left side with altered mental status and left-sided weakness.  Patient was tachycardic 124 with low-grade fever of 100.5 and blood pressure 154/121, oxygen saturations 100% room air.  Cranial CT scan showed large area of acute intraparenchymal hemorrhage within the right temporal parietal lobe 4.0 x 4.0 x 4.1 cm with surrounding vasogenic edema and mild effacement of the occipital horn and atrium of the right lateral ventricle.  Small amount of subarachnoid hemorrhage within [place] at the anterior right temporal lobe.  3 mm of right to left midline shift.  Admission chemistry sodium 126 chloride 97 glucose 157 BUN 41 creatinine 2.13, WBC 17,100, hemoglobin 10.2, lactic acid 3.5 troponin 55-33, blood cultures no growth to date, urinalysis negative nitrite, procalcitonin 0.32, COVID-19 and influenza testing negative.  CT of the chest without contrast showed significant motion artifact bilaterally however notable cylindrical and saccular  bronchiectatic changes with chronic bronchitic airway bilaterally.  CT of chest abdomen pelvis showed bilateral hydronephrosis and proximal hydroureter.  MRI/MRA of the head 25 mL intraparenchymal hematoma centered at the right temporal occipital region.  No vascular abnormality seen underlying the right cerebral hemorrhage.  Echocardiogram with ejection fraction of 65 to 70% no wall motion abnormalities grade 1 diastolic dysfunction.  Patient was placed on broad-spectrum intravenous antibiotic for suspected sepsis.  Urology services Dr. Diona Fanti consulted in regards to urinary retention/significant bilateral hydronephrosis recommended Foley catheter tube and Flomax x2 weeks and follow-up outpatient.  A follow-up renal ultrasound 07/24/2021 revealed no acute abnormality resolved bilateral hydronephrosis.  Renal services Dr. Moshe Cipro consulted in regards to longstanding CKD stage IV with baseline creatinine 2.0 elevating to 2.78 felt to be related to BP variability going from very high to very low normal.  Amlodipine and metoprolol discontinued and lattest creatinine 2.44.  Her antibiotic regimen has been transition to Levaquin 500 mg every 48 hours 07/23/2021 per PCCM x14 days.  RVP negative, blood cultures negative to date.  Anemia of chronic disease A's hemoglobin 6.9 awaiting plan transfusion.  She is tolerating a regular consistency diet.  Therapy evaluations completed due to patient decreased functional mobility left side weakness was admitted for a comprehensive rehab program. Patient transferred to CIR on 07/26/2021 .  ? ?Met with pt today to discuss TR services including leisure education, activity analysis/modifications and stress management.  Also discussed the importance of social, emotional, spiritual health in addition to physical health and their effects on overall health and wellness.  Pt stated understanding.  ? ?Pt presents with decreased activity tolerance, decreased functional mobility, decreased  balance, decreased memory, decreased safety awareness,  feelings of stress Limiting pt's independence with leisure/community pursuits. ?   ?Plan ? Min 1 Session >20 minutes for stress managment ? ?Recommendations for other services: None  ? ?Discharge Criteria: Patient will be discharged from TR if patient refuses treatment 3 consecutive times without medical reason.  If treatment goals not met, if there is a change in medical status, if patient makes no progress towards goals or if patient is discharged from hospital. ? ?The above assessment, treatment plan, treatment alternatives and goals were discussed and mutually agreed upon: by patient ? ?Terressa Evola ?07/31/2021, 3:40 PM  ?

## 2021-07-31 NOTE — Progress Notes (Addendum)
Occupational Therapy Session Note ? ?Patient Details  ?Name: Lauren Conner ?MRN: [id number] ?Date of Birth: 1941-01-06 ? ?Today's Date: 07/31/2021 ?OT Individual Time: 0699-9672 ?OT Individual Time Calculation (min): 70 min  ? ? ?Short Term Goals: ?Week 1:  OT Short Term Goal 1 (Week 1): LTG=STG ? ?Skilled Therapeutic Interventions/Progress Updates:  ?  Pt seated EOB upon arrival. Pt dressed with Ted hose donned and abdominal binder in place (adjusted for tighter fit later in session). BP seated 124/80, HR 94. Sit<>stand X 5 with pt symptomatic ("wobbly legs"). Pt reported that she walked to bathroom and stood at sink earlier without difficulty. Pt agreeable to walking out of room. Pt amb with HHA to ADL apartment with report that legs feeling wobbly near the end of the walk. MD met with pt during rounds. Pt returned to room in same fashion. Following a 5 min rest, pt again walked to ADL kitchen and apartment with close supervision (no AD). Pt reported her legs felt better at the end of walk. Pt returned to room with CGA. Min verbal cues for scanning to Lt and locating room. Pt remained seated EOB with all needs within reach. Bed alarm activated.  ? ?Therapy Documentation ?Precautions:  ?Precautions ?Precautions: Fall ?Precaution Comments: hx of orthostatic BP, L field cut ?Restrictions ?Weight Bearing Restrictions: No ? ?Pain: ? Pt c/o ongoing HA and new Rt ear pain; RN aware and meds admin prior to therapy ? ? ?Therapy/Group: Individual Therapy ? ?Leroy Libman ?07/31/2021, 9:31 AM ?

## 2021-07-31 NOTE — Progress Notes (Signed)
Occupational Therapy Session Note ? ?Patient Details  ?Name: Lauren Conner ?MRN: [id number] ?Date of Birth: Jun 19, 1940 ? ?Today's Date: 07/31/2021 ?OT Individual Time: 5747-3403 ?OT Individual Time Calculation (min): 45 min  ? ? ?Short Term Goals: ?Week 1:  OT Short Term Goal 1 (Week 1): LTG=STG ?Week 2:    ? ?Skilled Therapeutic Interventions/Progress Updates:  ?  1:1 Pt received in the w/c. Pt with low BP when stood up. Pt propelled with bilateral feet to [employer]. Focus on visual scanning and attention to the left with BIMS (bell cancellation, line maze and memory task ). All requiring more time for all tasks and with 60-70 % accuracy with difficulty with visual attention to left and scanning in a pattern (random ). ? ?Ambulated back to room pushing w/c with supervision.  ? ?Therapy Documentation ?Precautions:  ?Precautions ?Precautions: Fall ?Precaution Comments: hx of orthostatic BP, L field cut ?Restrictions ?Weight Bearing Restrictions: No ?General: ?  ?Vital Signs: ?BP initially: 107/ 70  ?Then in standing : 86/70 ?After sitting down for ~3 min 105/62 ?At end of session 102/64 ?Pain: ?No c/o pain  ? ?Therapy/Group: Individual Therapy ? ?[name] ?07/31/2021, 4:25 PM ?

## 2021-07-31 NOTE — Progress Notes (Signed)
Patient reporting right frontal/orbital HA with sharp ear pain this am. On eval, reported that ear ache resolved with tylenol but felt that maybe it was a sinus issue. Question allergies--will add Claritin to help with symptoms.  ?

## 2021-07-31 NOTE — Progress Notes (Addendum)
?                                                       PROGRESS NOTE ? ? ?Subjective/Complaints: ? ?Pt up with OT. Bp's low at first but better once she got up moving around. Legs got a little wobbly after walking a bit however. Otherwise feeling well ? ?ROS: Patient denies fever, rash, sore throat, blurred vision,  nausea, vomiting, diarrhea, cough, shortness of breath or chest pain, joint or back/neck pain, headache, or mood change.  ? ?Objective: ?  ?No results found. ?No results for input(s): WBC, HGB, HCT, PLT in the last 72 hours. ? ?No results for input(s): NA, K, CL, CO2, GLUCOSE, BUN, CREATININE, CALCIUM in the last 72 hours. ? ? ?Intake/Output Summary (Last 24 hours) at 07/31/2021 0950 ?Last data filed at 07/31/2021 0745 ?Gross per 24 hour  ?Intake 600 ml  ?Output 2275 ml  ?Net -1675 ml  ?  ? ?  ? ?Physical Exam: ?Vital Signs ?Blood pressure (!) 161/99, pulse 85, temperature 98.1 ?F (36.7 ?C), resp. rate 14, height 5\' 3"  (1.6 m), weight 55.6 kg, SpO2 98 %. ? ? ?Constitutional: No distress . Vital signs reviewed. ?HEENT: NCAT, EOMI, oral membranes moist ?Neck: supple ?Cardiovascular: RRR without murmur. No JVD    ?Respiratory/Chest: CTA Bilaterally without wheezes or rales. Normal effort    ?GI/Abdomen: BS +, non-tender, non-distended. Has abd binder on which is too big ?Ext: no clubbing, cyanosis, or edema ?Psych: pleasant and cooperative   ?Skin: No evidence of breakdown, no evidence of rash ?Neurologic: Alert and oriented x 3. Normal insight and awareness. Intact Memory. Normal language and speech. Cranial nerve exam unremarkable motor strength is 5/5 in bilateral deltoid, bicep, tricep, grip, hip flexor, knee extensors, ankle dorsiflexor and plantar flexor. Sensory exam normal for light touch and pain in all 4 limbs. No limb ataxia or cerebellar signs. No abnormal tone appreciated.  Kermit Balo weight shift and balance with gait using RW. ?Musculoskeletal: Full range of motion in all 4 extremities. No joint  swelling ? ? ?Assessment/Plan: ?1. Functional deficits which require 3+ hours per day of interdisciplinary therapy in a comprehensive inpatient rehab setting. ?Physiatrist is providing close team supervision and 24 hour management of active medical problems listed below. ?Physiatrist and rehab team continue to assess barriers to discharge/monitor patient progress toward functional and medical goals ? ?Care Tool: ? ?Bathing ?   ?Body parts bathed by patient: Right arm, Left arm, Chest, Abdomen, Front perineal area, Buttocks, Left upper leg, Right upper leg, Right lower leg, Left lower leg, Face  ?   ?  ?  ?Bathing assist Assist Level: Set up assist ?  ?  ?Upper Body Dressing/Undressing ?Upper body dressing   ?What is the patient wearing?: Bra, Pull over shirt ?   ?Upper body assist Assist Level: Minimal Assistance - Patient > 75% ?   ?Lower Body Dressing/Undressing ?Lower body dressing ? ? ?   ?What is the patient wearing?: Underwear/pull up, Pants ? ?  ? ?Lower body assist Assist for lower body dressing: Set up assist ?   ? ?Toileting ?Toileting Toileting Activity did not occur (Probation officer and hygiene only): N/A (no void or bm)  ?Toileting assist Assist for toileting: Minimal Assistance - Patient > 75% ?  ?  ?Transfers ?Chair/bed transfer ? ?  Transfers assist ?   ? ?Chair/bed transfer assist level: Contact Guard/Touching assist ?  ?  ?Locomotion ?Ambulation ? ? ?Ambulation assist ? ?   ? ?Assist level: Contact Guard/Touching assist ?Assistive device: No Device ?Max distance: 55ft  ? ?Walk 10 feet activity ? ? ?Assist ?   ? ?Assist level: Contact Guard/Touching assist ?Assistive device: No Device  ? ?Walk 50 feet activity ? ? ?Assist   ? ?Assist level: Contact Guard/Touching assist ?Assistive device: No Device  ? ? ?Walk 150 feet activity ? ? ?Assist Walk 150 feet activity did not occur: Safety/medical concerns (decreased activity tolerance) ? ?  ?  ?  ? ?Walk 10 feet on uneven surface  ?activity ? ? ?Assist    ? ? ?Assist level: Minimal Assistance - Patient > 75% ?   ? ?Wheelchair ? ? ? ? ?Assist Is the patient using a wheelchair?: Yes ?Type of Wheelchair: Manual ?  ? ?Wheelchair assist level: Minimal Assistance - Patient > 75%, Supervision/Verbal cueing ?Max wheelchair distance: >150 ft  ? ? ?Wheelchair 50 feet with 2 turns activity ? ? ? ?Assist ? ?  ?  ? ? ?Assist Level: Supervision/Verbal cueing  ? ?Wheelchair 150 feet activity  ? ? ? ?Assist ?   ? ? ?Assist Level: Minimal Assistance - Patient > 75%  ? ?Blood pressure (!) 161/99, pulse 85, temperature 98.1 ?F (36.7 ?C), resp. rate 14, height 5\' 3"  (1.6 m), weight 55.6 kg, SpO2 98 %. ? ?Medical Problem List and Plan: ?1. 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 ?            -patient may shower ?            -ELOS/Goals: 10-14 days S to mod I ?          -Continue CIR therapies including PT, OT, and SLP  ?2.  Antithrombotics: ?-DVT/anticoagulation:  Mechanical: Antiembolism stockings, thigh (TED hose) Bilateral lower extremities ?            -antiplatelet therapy: N/A ?3. Pain Management: Tylenol as needed--change to liquid form and schedule tid ?4. Mood: Provide emotional support ?            -antipsychotic agents: N/A ?5. Neuropsych: This patient is capable of making decisions on her own behalf. ?6. Skin/Wound Care: Routine skin checks ?7. Fluids/Electrolytes/Nutrition: encourage po ? I have personally reviewed the patient's labs  .   ?8.  CAP with baseline bronchiectasis.  Follow-up PCCM.   ?-Complete 14-day course Levaquin initiated 07/23/2021.   ?-Continue inhalers as directed ?9.  Acute urinary retention with significant bilateral hydronephrosis.  Follow-up 07/23/2021 Dr. Diona Fanti.  Recommend CONTINUE FOLEY CATHETER TUBE  and Flomax for at least 2 weeks and follow-up outpatient.  Follow-up renal ultrasound 07/24/2021 showed resolved bilateral hydronephrosis ? -reviewed plan with pt  today.  ?10.  AKI on CKD stage IV.  Patient followed by Dr.Lehrich at [facility] with baseline creatinine 2.0.  Currently seen by Dr. Moshe Cipro while at [facility].  Currently with worsening of renal function suspect related to BP variability with amlodipine and metoprolol discontinued.    ? -  Cr near her current baseline of 2.5 ? -continue to monitor, good urine output ?11.  Hypertension.    Lopressor currently discontinued.   ?Vitals:  ? 07/30/21 1931 07/31/21 0456  ?BP: (!) 143/86 (!) 161/99  ?Pulse: (!) 103 85  ?Resp: 15 14  ?Temp: Marland Kitchen)  97.3 ?F (36.3 ?C) 98.1 ?F (36.7 ?C)  ?SpO2: 99% 98%  ? -significant orthostasis yesterday---only on small dose norvasc. Also taking flomax----resting bp's still high ? -acclimation, TEDS ? -needs appropriately sized abd binder---will check with [facility] ? -encourage adequate fluids ? -change timing of norvasc to bedtime to help with am htn ? -begin low dose trial of midodrine in am and lunchtime ? -check T4 tomorrow ?12.  Hypothyroidism: continue Synthroid--see above ?13.  History of cardiac dysfunction with SVT.  Cardiac rate controlled. Monitor HR TID.  ?14.  Anemia of chronic disease.  Transfused 2 units 07/25/2021 patient had been receiving Aranesp every 2 weeks ?  3/13 hgb up to 11.1, stable ?15. Slow transit constipation: ? -sorbitol with results---will not use again d/t nausea   ? -continue senna and miralax daily ?  ? ?LOS: ?5 days ?A FACE TO FACE EVALUATION WAS PERFORMED ? ?Meredith Staggers ?07/31/2021, 9:50 AM  ? ? ? ?

## 2021-08-01 LAB — GLUCOSE, CAPILLARY: Glucose-Capillary: 70 mg/dL (ref 70–99)

## 2021-08-01 LAB — T4, FREE: Free T4: 0.9 ng/dL (ref 0.61–1.12)

## 2021-08-01 MED ORDER — LEVOTHYROXINE SODIUM 100 MCG PO TABS
100.0000 ug | ORAL_TABLET | Freq: Every day | ORAL | Status: DC
  Administered 2021-08-02 – 2021-08-05 (×4): 100 ug via ORAL
  Filled 2021-08-01 (×4): qty 1

## 2021-08-01 NOTE — Progress Notes (Signed)
Physical Therapy Session Note ? ?Patient Details  ?Name: Lauren Conner ?MRN: [id number] ?Date of Birth: 1941/01/07 ? ?Today's Date: 08/01/2021 ?PT Individual Time: 9470-9628 ?PT Individual Time Calculation (min): 71 min  ? ?Short Term Goals: ?Week 1:  PT Short Term Goal 1 (Week 1): STG=LTG due to ELOS ? ? ?Skilled Therapeutic Interventions/Progress Updates:  ?Patient seated upright in w/c on entrance to room. Patient alert and agreeable to PT session. Granddtr in room and exits on start of session. ? ?Patient with no pain complaint throughout session. Abdominal binder and thigh TEDs already donned.  ? ?Therapeutic Activity: ?Transfers: Patient performed sit<>stand and stand pivot transfers throughout session with supervision. Provided verbal cues for effort and reaching back to armrest prior to descent to sit. ? ?Gait Training:  ?Patient ambulated 100' x2 using rollator to reach ortho gym and then with no AD on return from ortho gym. Light CGA throughout.  Decreased time spent in stance phase on LLE. Demonstrated good ability to use light stepping strategy to correct minor balance adjustments necessary through return bout. Provided vc/ tc for increased step length and height to attempt to match stepping pattern bilaterally. ? ?Neuromuscular Re-ed: ?NMR facilitated during session with focus on standing balance. Pt guided in use of BITS for dual tasking of cognitive and balance in correct, safe reaching for targets. Rotating alphabet in sequence attempted twice with pt requiring max cues throughout for sequence of alphabet and correct progression of target. Improved with significantly less cues for numerical targets. Demos ability to stand for 4min and good self assessment of fatigue in request to rest.  NMR performed for improvements in motor control and coordination, balance, sequencing, judgement, and self confidence/ efficacy in performing all aspects of mobility at highest level of independence.  ? ?Patient seated   in w/c at end of session with brakes locked, seat pad alarm set, and all needs within reach. ?   ? ?Therapy Documentation ?Precautions:  ?Precautions ?Precautions: Fall ?Precaution Comments: hx of orthostatic BP, L field cut ?Restrictions ?Weight Bearing Restrictions: No ?General: ?  ?Vital Signs: ? ?Pain: ? No pain complaint this session.  ? ?Therapy/Group: Individual Therapy ? ?Alger Simons PT, DPT ?08/01/2021, 5:18 PM  ?

## 2021-08-01 NOTE — Progress Notes (Signed)
Speech Language Pathology Daily Session Note ? ?Patient Details  ?Name: Lauren Conner ?MRN: [id number] ?Date of Birth: Feb 26, 1941 ? ?Today's Date: 08/01/2021 ?SLP Individual Time: 5329-9242 ?SLP Individual Time Calculation (min): 45 min ? ?Short Term Goals: ?Week 1: SLP Short Term Goal 1 (Week 1): STG=LTG due to ELOS ? ?Skilled Therapeutic Interventions: Skilled treatment session focused on cognitive goals. SLP facilitated session by providing Mod verbal and visual cues for left visual scanning and error awareness during a calendar task in which patient had to write appointments down in a calendar. A visual anchor was utilized to maximize visual scanning to the left with Mod verbal cues also needed for input of proper information and use of abbreviations vs writing sentences. Patient reported that she only plans on having family provide supervision "for a few days" as she is a "private person." SLP provided support but also provided education regarding the importance of 24 hour supervision to maximize safety due to decreased visual attention to the left field of environment and ongoing BP issues. Patient verbalized understanding but will need reinforcement. Patient left upright in bed with alarm on and all needs within reach. Continue with current plan of care.  ?   ? ?Pain ?No/Denies Pain  ? ?Therapy/Group: Individual Therapy ? ?Joe Tanney ?08/01/2021, 11:39 AM ?

## 2021-08-01 NOTE — Progress Notes (Signed)
Occupational Therapy Session Note ? ?Patient Details  ?Name: Lauren Conner ?MRN: [id number] ?Date of Birth: [birth year] ? ?Today's Date: 08/01/2021 ?OT Individual Time: 2449-7530 ?OT Individual Time Calculation (min): 45 min  ? ? ?Short Term Goals: ?Week 1:  OT Short Term Goal 1 (Week 1): LTG=STG ? ?Skilled Therapeutic Interventions/Progress Updates:  ?  1:1 Pt granddaughter present for session  [name]). Pt participated in self care retraining at shower level. Pt taken in to bathroom via w/c and able to doff clothing and transition into shower and bathe with supervision. Pt continues to require A to orient clothing in order to don properly- with min to mod cues. Once clothing oriented properly able to don with supervision. Transfers (stand pivot) with supervision.  ? ?BP in session: 138/41 ?130/74 ?117/86 after standing at the sink to brush teeth and hair with TEDS and abdominal binder donned.  ?Left sitting up in w/c with fam present. ? ?Therapy Documentation ?Precautions:  ?Precautions ?Precautions: Fall ?Precaution Comments: hx of orthostatic BP, L field cut ?Restrictions ?Weight Bearing Restrictions: No ?General: ?  ?Vital Signs: ?Therapy Vitals ?Temp: 97.8 ?F (36.6 ?C) ?Pulse Rate: 96 ?Resp: 15 ?BP: (!) 156/79 ?Patient Position (if appropriate): Lying ?Oxygen Therapy ?SpO2: 99 % ?O2 Device: Room Air ?Pain: ? No c/o pain in session  ? ? ?Therapy/Group: Individual Therapy ? ?[name] ?08/01/2021, 3:46 PM ?

## 2021-08-01 NOTE — Progress Notes (Signed)
?  Recreational Therapy Session Note ? ?Patient Details  ?Name: Lauren Conner ?MRN: [id number] ?Date of Birth: [birth year] ?Today's Date: 08/01/2021 ? ?Pain: no c/o ?Skilled Therapeutic Interventions/Progress Updates: Pt participated in stress managment/coping group today.  Pt education/discussion focused on stress exploration including factors that contribute to stress, factors that protect against stress and potential coping strategies.  Coping strategies included deep breathing, progressive muscle relaxation, imagery & challenging irrational thoughts.  Handouts provided. ? ? ?Therapy/Group: Group Therapy ? ?Jahmel Flannagan ?08/01/2021, 12:32 PM  ? ? ?Recreational Therapy Discharge Summary ?Patient Details  ?Name: Lauren Conner ?MRN: [id number] ?Date of Birth: November 28, 1940 ?Today's Date: 08/01/2021 ? ?Comments on progress toward goals: TR sessions focused on pt education including leisure education, activity analysis/modifications and stress management.  Pt is extremely motivated but needs verbal cues for safety/awareness.  Pt is planning to discharge home with family to provide/coordinate 24 hour supervision/assistance. ? ?Reasons for discharge: discharge from hospital ? ?Follow-up: Outpatient ? ?Patient/family agrees with progress made and goals achieved: Yes ? ?Kalleigh Harbor ?08/01/2021, 12:31 PM ? ? ?

## 2021-08-01 NOTE — Progress Notes (Signed)
?                                                       PROGRESS NOTE ? ? ?Subjective/Complaints: ? ?Pt feels that she wasn't as dizzy with midodrine yesterday afternoon. Feeling ok this morning.  ? ?Objective: ?  ?No results found. ?No results for input(s): WBC, HGB, HCT, PLT in the last 72 hours. ? ?No results for input(s): NA, K, CL, CO2, GLUCOSE, BUN, CREATININE, CALCIUM in the last 72 hours. ? ? ?Intake/Output Summary (Last 24 hours) at 08/01/2021 1038 ?Last data filed at 08/01/2021 [phone] ?Gross per 24 hour  ?Intake 714 ml  ?Output 375 ml  ?Net 339 ml  ?  ? ?  ? ?Physical Exam: ?Vital Signs ?Blood pressure 133/73, pulse 83, temperature 98.5 ?F (36.9 ?C), temperature source Oral, resp. rate 15, height 5\' 3"  (1.6 m), weight 55.6 kg, SpO2 95 %. ? ? ?General: No acute distress ?HEENT: NCAT, EOMI, oral membranes moist ?Cards: reg rate  ?Chest: normal effort ?Abdomen: Soft, NT, ND ?Skin: dry, intact ?Extremities: no edema ?Psych: pleasant and appropriate  ?Skin: No evidence of breakdown, no evidence of rash ?Neurologic: Alert and oriented x 3. Normal insight and awareness. Intact Memory. Normal language and speech. Cranial nerve exam unremarkable motor strength is 5/5 in bilateral deltoid, bicep, tricep, grip, hip flexor, knee extensors, ankle dorsiflexor and plantar flexor. Sensory exam normal for light touch and pain in all 4 limbs. No limb ataxia or cerebellar signs. No abnormal tone appreciated.  Kermit Balo weight shift and balance with gait using RW. ?Musculoskeletal: Full range of motion in all 4 extremities. No joint swelling ? ? ?Assessment/Plan: ?1. Functional deficits which require 3+ hours per day of interdisciplinary therapy in a comprehensive inpatient rehab setting. ?Physiatrist is providing close team supervision and 24 hour management of active medical problems listed below. ?Physiatrist and rehab team continue to assess barriers to discharge/monitor patient progress toward functional and medical  goals ? ?Care Tool: ? ?Bathing ?   ?Body parts bathed by patient: Right arm, Left arm, Chest, Abdomen, Front perineal area, Buttocks, Left upper leg, Right upper leg, Right lower leg, Left lower leg, Face  ?   ?  ?  ?Bathing assist Assist Level: Set up assist ?  ?  ?Upper Body Dressing/Undressing ?Upper body dressing   ?What is the patient wearing?: Bra, Pull over shirt ?   ?Upper body assist Assist Level: Minimal Assistance - Patient > 75% ?   ?Lower Body Dressing/Undressing ?Lower body dressing ? ? ?   ?What is the patient wearing?: Underwear/pull up, Pants ? ?  ? ?Lower body assist Assist for lower body dressing: Set up assist ?   ? ?Toileting ?Toileting Toileting Activity did not occur (Probation officer and hygiene only): N/A (no void or bm)  ?Toileting assist Assist for toileting: Minimal Assistance - Patient > 75% ?  ?  ?Transfers ?Chair/bed transfer ? ?Transfers assist ?   ? ?Chair/bed transfer assist level: Supervision/Verbal cueing ?  ?  ?Locomotion ?Ambulation ? ? ?Ambulation assist ? ?   ? ?Assist level: Supervision/Verbal cueing ?Assistive device: Rollator ?Max distance: 300'  ? ?Walk 10 feet activity ? ? ?Assist ?   ? ?Assist level: Supervision/Verbal cueing ?Assistive device: Rollator  ? ?Walk 50 feet activity ? ? ?Assist   ? ?  Assist level: Supervision/Verbal cueing ?Assistive device: Rollator  ? ? ?Walk 150 feet activity ? ? ?Assist Walk 150 feet activity did not occur: Safety/medical concerns (decreased activity tolerance) ? ?Assist level: Supervision/Verbal cueing ?Assistive device: Rollator ?  ? ?Walk 10 feet on uneven surface  ?activity ? ? ?Assist   ? ? ?Assist level: Minimal Assistance - Patient > 75% ?   ? ?Wheelchair ? ? ? ? ?Assist Is the patient using a wheelchair?: Yes ?Type of Wheelchair: Manual ?  ? ?Wheelchair assist level: Minimal Assistance - Patient > 75%, Supervision/Verbal cueing ?Max wheelchair distance: >150 ft  ? ? ?Wheelchair 50 feet with 2 turns activity ? ? ? ?Assist ? ?  ?   ? ? ?Assist Level: Supervision/Verbal cueing  ? ?Wheelchair 150 feet activity  ? ? ? ?Assist ?   ? ? ?Assist Level: Minimal Assistance - Patient > 75%  ? ?Blood pressure 133/73, pulse 83, temperature 98.5 ?F (36.9 ?C), temperature source Oral, resp. rate 15, height 5\' 3"  (1.6 m), weight 55.6 kg, SpO2 95 %. ? ?Medical Problem List and Plan: ?1. 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 ?            -patient may shower ?            -ELOS/Goals: 10-14 days S to mod I ?          -Continue CIR therapies including PT, OT, and SLP  ?2.  Antithrombotics: ?-DVT/anticoagulation:  Mechanical: Antiembolism stockings, thigh (TED hose) Bilateral lower extremities ?            -antiplatelet therapy: N/A ?3. Pain Management: Tylenol as needed--change to liquid form and schedule tid ?4. Mood: Provide emotional support ?            -antipsychotic agents: N/A ?5. Neuropsych: This patient is capable of making decisions on her own behalf. ?6. Skin/Wound Care: Routine skin checks ?7. Fluids/Electrolytes/Nutrition: encourage po ? Good po intake now  .   ?8.  CAP with baseline bronchiectasis.  Follow-up PCCM.   ?-Complete 14-day course Levaquin initiated 07/23/2021.   ?-Continue inhalers as directed ?9.  Acute urinary retention with significant bilateral hydronephrosis.  Follow-up 07/23/2021 Dr. Diona Fanti.  Recommend CONTINUE FOLEY CATHETER TUBE  and Flomax for at least 2 weeks and follow-up outpatient.  Follow-up renal ultrasound 07/24/2021 showed resolved bilateral hydronephrosis ? -continue per above  ?10.  AKI on CKD stage IV.  Patient followed by Dr.Lehrich at [facility] with baseline creatinine 2.0.  Currently seen by Dr. Moshe Cipro while at [facility].  Currently with worsening of renal function suspect related to BP variability with amlodipine and metoprolol discontinued.    ? -  Cr near her current baseline of 2.5 ? -continue to monitor, good  urine output ?11.  Hypertension.    Lopressor currently discontinued.   ?Vitals:  ? 08/01/21 0438 08/01/21 0706  ?BP: 133/73   ?Pulse: 83   ?Resp: 15   ?Temp: 98.5 ?F (36.9 ?C)   ?SpO2: 95% 95%  ? -significant orthostasis yesterday---only on small dose norvasc. Also taking flomax----resting bp's still high ? -acclimation, TEDS ? -needs appropriately sized abd binder---will check with [facility] ? -encourage adequate fluids ? -change timing of norvasc to bedtime to help with am htn ? 3/17 -continue low dose trial of midodrine in am and lunchtime ?         -free T4 on 0.9, will adjust  synthroid to 132mcg daily ?12.  Hypothyroidism: continue Synthroid--see above ?13.  History of cardiac dysfunction with SVT.  Cardiac rate controlled. Monitor HR TID.  ?14.  Anemia of chronic disease.  Transfused 2 units 07/25/2021 patient had been receiving Aranesp every 2 weeks ?  3/13 hgb up to 11.1, stable--recheck monday ?15. Slow transit constipation: ? --continue senna and miralax daily ?  -LBM 3/16 ? ?LOS: ?6 days ?A FACE TO FACE EVALUATION WAS PERFORMED ? ?Meredith Staggers ?08/01/2021, 10:38 AM  ? ? ? ?

## 2021-08-01 NOTE — Progress Notes (Signed)
Patient ID: Lauren Conner, female   DOB: 01-14-1941, 81 y.o.   MRN: [id number] ? ?SW received d/c recs- Outpatient PT/OT/SLP and DME- rollator and transport chair. ? ?SW spoke with pt son Lauren Conner to inform on above updates. SW shared [facility] will call to discuss copays if any about DME. SW to follow-up with pt son Lauren Conner to discuss outpatient preference. SW spoke with Lauren Conner to discuss above. He prefers [facility] for outpatient therapies.  ? ?SW faxed outpatient referral to [facility] ([phone]). SW ordered DME with [facility] [employer] via parachute.  ? ?Lauren Conner, MSW, LCSWA ?Office: [phone] ?Cell: [phone] ?Fax: [phone]  ?

## 2021-08-01 NOTE — Progress Notes (Signed)
Occupational Therapy Session Note ? ?Patient Details  ?Name: Lauren Conner ?MRN: [id number] ?Date of Birth: [birth year] ? ?Today's Date: 08/01/2021 ?OT Group Time: 1115-1200 ?OT Group Time Calculation (min): 45 min ? ? ?Short Term Goals: ?Week 1:  OT Short Term Goal 1 (Week 1): LTG=STG ? ?Skilled Therapeutic Interventions/Progress Updates:  ?Pt participated in group session with a focus on stress mgmt, education on healthy coping strategies, and social interaction. Focus of session on providing coping strategies to manage new current level of function as a result of new diagnosis.  Session focus on breaking down stressors into ?daily hassles,? ?major life stressors? and ?life circumstances? in an effort to allow pts to chunk their stressors into groups. Pt actively sharing stressors and contributing to group conversation. Provided active listening, emotional support and therapeutic use of self. Offered education on factors that protect [place] against stress such as ?daily uplifts,? ?healthy coping strategies? and ?protective factors.? Encouraged all group members to make an effort to actively recall one event from their day that was a daily uplift in an effort to protect their mindset from stressors as well as sharing this information with their caregivers to facilitate improved caregiver communication and decrease overall burden of care.  Issued pt handouts on healthy coping strategies to implement into routine. Pt transported back to room by RT. ? ?Therapy Documentation ?Precautions:  ?Precautions ?Precautions: Fall ?Precaution Comments: hx of orthostatic BP, L field cut ?Restrictions ?Weight Bearing Restrictions: No ? ?  ?Pain:no pain reported during group session  ? ? ? ?Therapy/Group: Group Therapy ? ?Precious Haws ?08/01/2021, 12:34 PM ?

## 2021-08-02 NOTE — Progress Notes (Signed)
Physical Therapy Session Note ? ?Patient Details  ?Name: Lauren Conner ?MRN: [id number] ?Date of Birth: [birth year] ? ?Today's Date: 08/02/2021 ?PT Individual Time: 0800-0900 ?PT Individual Time Calculation (min): 60 min  ? ?Short Term Goals: ?Week 1:  PT Short Term Goal 1 (Week 1): STG=LTG due to ELOS ? ?Skilled Therapeutic Interventions/Progress Updates: Pt presents L sidelying in bed but eager for therapy.  THT donned w/ total A in supine.  Pt monitored for BP in supine at 136/87.  Pt transferred sup to sit w/ Independence and scooted to EOB.  PT assisted w/ abd binder donning at EOB.  BP at 141/89 w/o c/o symptoms.  P transferred sit to stand w/ supervision to rollator and BP at 122/72.  Pt amb to dayroom w/ rollator and supervision x 300'.  Pt performed negotiation of bowling pin obstacle course w/ occasional verbal cues to avoid objects to left.  Pt performing obstacle course as well as stating grandchildren's names and ages w/ noted decrease in safety, bumping pins.  Pt amb to room w/ rollator and supervision, verbal cues for left sided objects. Pt amb w/o AD to sink to perform standing hygiene including brushing hair, and teeth w/o UE support and pt handling all tasks w/o A from PT.  Pt remained sitting in w/c w/ seat alarm on and all needs in reach. ?   ? ?Therapy Documentation ?Precautions:  ?Precautions ?Precautions: Fall ?Precaution Comments: hx of orthostatic BP, L field cut ?Restrictions ?Weight Bearing Restrictions: No ?General: ?  ?Vital Signs: ?Therapy Vitals ?Temp: 98.7 ?F (37.1 ?C) ?Pulse Rate: 95 ?Resp: 14 ?BP: 131/75 ?Patient Position (if appropriate): Lying ?Oxygen Therapy ?SpO2: 98 % ?Pain:0/10 ?  ? ? ? ?Therapy/Group: Individual Therapy ? ?Lauren Conner ?08/02/2021, 9:07 AM  ?

## 2021-08-03 MED ORDER — DICLOFENAC SODIUM 1 % EX GEL
2.0000 g | Freq: Three times a day (TID) | CUTANEOUS | Status: DC
  Filled 2021-08-03: qty 100

## 2021-08-03 NOTE — Progress Notes (Signed)
Alert and oriented, follows commands with no issues. Compliant with medication administration. Denies pain or discomfort. ?Foley catheter patent; no issues. Patient in a pleasant mood this shift.  ? ? ? ?Yehuda Mao, LPN ?

## 2021-08-03 NOTE — Discharge Summary (Signed)
Physician Discharge Summary  ?Patient ID: ?Lauren Conner ?MRN: [id number] ?DOB/AGE: 08-30-1940 81 y.o. ? ?Admit date: 07/26/2021 ?Discharge date: 08/05/2021 ? ?Discharge Diagnoses:  ?Principal Problem: ?  Intraparenchymal hemorrhage of brain ([place]) ?CAP with baseline bronchiectasis ?Acute urinary retention with significant bilateral hydronephrosis ?AKI on CKD stage IV ?Hypertension ?Hypothyroidism ?History of cardiac dysfunction with SVT ?Anemia of chronic disease ? ?Discharged Condition: Stable ? ?Significant Diagnostic Studies: ?CT ABDOMEN PELVIS WO CONTRAST ? ?Result Date: 07/25/2021 ?CLINICAL DATA:  Abdominal pain, acute, nonlocalized anemia, unclear etiology, r/o RPH EXAM: CT ABDOMEN AND PELVIS WITHOUT CONTRAST TECHNIQUE: Multidetector CT imaging of the abdomen and pelvis was performed following the standard protocol without IV contrast. RADIATION DOSE REDUCTION: This exam was performed according to the departmental dose-optimization program which includes automated exposure control, adjustment of the mA and/or kV according to patient size and/or use of iterative reconstruction technique. COMPARISON:  CT 07/20/2021 FINDINGS: Lower chest: Large hiatal hernia. 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 Small bilateral pleural effusions. Hepatobiliary: Unchanged hepatic cysts. The gallbladder is unremarkable. Pancreas: Unremarkable. No pancreatic ductal dilatation or surrounding inflammatory changes. Spleen: Normal in size.  Multiple calcified splenic granulomas. Adrenals/Urinary Tract: Adrenal glands are unremarkable. Interval placement of the Foley catheter with decompression of the bladder and renal collecting systems. Unchanged multiple bilateral renal cysts. Stomach/Bowel: Large hiatal hernia with intrathoracic stomach. There is no evidence of bowel obstruction.There is retained barium within the colon. The  appendix is not definitively visualized. Extensive sigmoid diverticulosis without evidence of acute diverticulitis. Vascular/Lymphatic: Aortoiliac atherosclerotic calcifications. No AAA. No lymphadenopathy. Reproductive: Unremarkable. Other: No free air. No abdominopelvic ascites. No evidence of retroperitoneal hematoma as clinically questioned. Musculoskeletal: No acute osseous abnormality. No suspicious lytic or blastic lesions. Multilevel degenerative changes of the spine. IMPRESSION: No acute abdominopelvic abnormality. Interval Foley catheter placement with decompression of the bladder and renal collecting systems. Colonic diverticulosis. Unchanged large hiatal hernia. Unchanged parenchymal findings the lung bases consistent with chronic atypical mycobacterial infection. Small bilateral pleural effusions. Electronically Signed   By: Maurine Simmering M.D.   On: 07/25/2021 10:56  ? ?CT ABDOMEN PELVIS WO CONTRAST ? ?Result Date: 07/20/2021 ?CLINICAL DATA:  Altered mental status with fever, tachycardia and tachypnea. EXAM: CT ABDOMEN AND PELVIS WITHOUT CONTRAST TECHNIQUE: Multidetector CT imaging of the abdomen and pelvis was performed following the standard protocol without IV contrast. RADIATION DOSE REDUCTION: This exam was performed according to the departmental dose-optimization program which includes automated exposure control, adjustment of the mA and/or kV according to patient size and/or use of iterative reconstruction technique. COMPARISON:  None. FINDINGS: Lower chest: The study is markedly limited secondary to patient motion. Marked severity multifocal infiltrates and extensive bronchiectasis are seen throughout both lung bases. Hepatobiliary: 13 mm and 18 mm diameter cysts are seen within the anterior aspect of the right lobe of the liver. A 10 mm cystic appearing area is seen within the inferior aspect of the right lobe. There is mild central intrahepatic biliary dilatation. The gallbladder is mildly  distended without evidence of gallstones, gallbladder wall thickening or pericholecystic fluid. Pancreas: Unremarkable. No pancreatic ductal dilatation or surrounding inflammatory changes. Spleen: Punctate calcified granulomas are seen scattered throughout the parenchyma of a small spleen. Adrenals/Urinary Tract: Adrenal glands are unremarkable. Kidneys are normal in size. A 2.0 cm diameter cyst is seen along the posterior aspect of the mid left kidney. There is marked severity bilateral hydronephrosis and proximal hydroureter  without obstructing renal calculi. Bilateral nonspecific perinephric inflammatory fat stranding is seen. The urinary bladder is markedly distended. Stomach/Bowel: There is a large hiatal hernia. Appendix appears normal. No evidence of bowel wall thickening, distention, or inflammatory changes. Noninflamed diverticula are seen throughout the large bowel. Vascular/Lymphatic: Mild aortic atherosclerosis. No enlarged abdominal or pelvic lymph nodes. Reproductive: Uterus and bilateral adnexa are unremarkable. Other: No abdominal wall hernia or abnormality. No abdominopelvic ascites. Musculoskeletal: Marked severity multilevel degenerative changes are seen throughout the lumbar spine. IMPRESSION: 1. Marked severity bibasilar multifocal infiltrates. 2. Marked severity bilateral hydronephrosis and proximal hydroureter in the setting of a markedly distended urinary bladder. 3. Large hiatal hernia. 4. Colonic diverticulosis. 5. Marked severity multilevel degenerative changes throughout the lumbar spine. 6. Aortic atherosclerosis. Aortic Atherosclerosis (ICD10-I70.0). Electronically Signed   By: Virgina Norfolk M.D.   On: 07/20/2021 02:02  ? ?CT HEAD WO CONTRAST (5MM) ? ?Result Date: 07/25/2021 ?CLINICAL DATA:  Subarachnoid hemorrhage follow-up EXAM: CT HEAD WITHOUT CONTRAST TECHNIQUE: Contiguous axial images were obtained from the base of the skull through the vertex without intravenous contrast.  RADIATION DOSE REDUCTION: This exam was performed according to the departmental dose-optimization program which includes automated exposure control, adjustment of the mA and/or kV according to patient size and/or use of iterative reconstruction technique. COMPARISON:  Three days ago FINDINGS: Brain: Unchanged shape and extent of parenchymal hemorrhage in the right temporal and parietal lobes with irregular shape limiting accurate measurement. Small volume overlying subarachnoid blood. Midline shift measures 3 mm. No entrapment or interval infarct Vascular: Atheromatous calcification Skull: Normal. Negative for fracture or focal lesion. Sinuses/Orbits: No acute finding. IMPRESSION: Unchanged right cerebral hemorrhage with adjacent edema. Midline shift measures 3 mm. Electronically Signed   By: Jorje Guild M.D.   On: 07/25/2021 11:28  ? ?CT HEAD WO CONTRAST ? ?Result Date: 07/22/2021 ?CLINICAL DATA:  Intracranial hemorrhage. EXAM: CT HEAD WITHOUT CONTRAST TECHNIQUE: Contiguous axial images were obtained from the base of the skull through the vertex without intravenous contrast. RADIATION DOSE REDUCTION: This exam was performed according to the departmental dose-optimization program which includes automated exposure control, adjustment of the mA and/or kV according to patient size and/or use of iterative reconstruction technique. COMPARISON:  CT head without contrast 07/21/2021. MR head and MRA head 07/21/2021. FINDINGS: Brain: Hemorrhage involving the right parietal and temporal lobe is not significantly changed in size, measuring 4.0 x 3.2 x 4.4 cm. Blood products have begun to lay are. Surrounding edema and mass effect is similar the prior studies. Effacement of the sulci the posterior horn of the right lateral ventricle noted. Minimal intraventricular blood products are unchanged. Subarachnoid blood along the right temporal lobe is also stable. No new hemorrhage is present. One 2 mm midline shift is stable. No  significant extra-axial fluid is present on the left hemisphere. The brainstem and cerebellum are within normal limits. Vascular: Atherosclerotic calcifications are present within the cavernous internal carotid arte

## 2021-08-03 NOTE — Progress Notes (Signed)
Occupational Therapy Session Note ? ?Patient Details  ?Name: Lauren Conner ?MRN: [id number] ?Date of Birth: Sep 10, 1940 ? ?Today's Date: 08/03/2021 ?OT Individual Time: 1000-1055 ?OT Individual Time Calculation (min): 55 min  ? ? ?Short Term Goals: ?Week 1:  OT Short Term Goal 1 (Week 1): LTG=STG ?Week 2:    ? ?Skilled Therapeutic Interventions/Progress Updates:  ?  1:1 Pt received in the bed. Pt ambulated to the bathroom with Rollator and positioned it against the wall to doff clothing. BP was 130/80. Pt did require cues for locking and unlocking brakes. Pt able to doff clothing with A for unthreading of the catheter bag. Pt transitioned into the shower and bathed with distant supervision. Pt dressed sittong on the Rollator with supervision with extra time. PT was able to successfully orient clothing on her own and don her bra without errors and extra time. She did hook it behind her back. A for donning TEDS and abdominal binder. Pt ambulated with Rollator to the sink and stood to brush teeth and hair. Left resting in the w/c with alarm set.  ? ?BP 123/80 after dressing  ? ? ?Therapy Documentation ?Precautions:  ?Precautions ?Precautions: Fall ?Precaution Comments: hx of orthostatic BP, L field cut ?Restrictions ?Weight Bearing Restrictions: No ?General: ?  ?Vital Signs: ?Oxygen Therapy ?SpO2: 97 % ?O2 Device: Room Air ?Pain: ?  ?ADL: ?ADL ?Equipment Provided:  (RW) ?Eating: Supervision/safety ?Where Assessed-Eating:  (not observed, however, based on funcitonal status) ?Grooming: Supervision/safety ?Where Assessed-Grooming: Sitting at sink ?Upper Body Bathing: Supervision/safety ?Where Assessed-Upper Body Bathing: Shower ?Lower Body Bathing: Supervision/safety ?Where Assessed-Lower Body Bathing: Shower ?Upper Body Dressing: Supervision/safety ?Where Assessed-Upper Body Dressing: Sitting at sink ?Lower Body Dressing: Supervision/safety ?Toileting: Supervision/safety ?Where Assessed-Toileting: Toilet ?Toilet Transfer:  Close supervision ?Toilet Transfer Method: Ambulating ?Science writer: Grab bars (RW for functional mobility) ?Tub/Shower Transfer: Modified independent (using a shower bench for safety and a RW for functional mobility.) ?Tub/Shower Transfer Method: Ambulating ?Tub/Shower Equipment: Shower seat with back, Walk in shower ?Walk-In Shower Transfer: Close supervision ?Walk-In Shower Transfer Method: Ambulating ?Walk-In Shower Equipment: Civil engineer, contracting with back ?Vision ?Baseline Vision/History: 1 Wears glasses ?Patient Visual Report: Peripheral vision impairment ?Eye Alignment: Within Functional Limits ?Ocular Range of Motion: Within Functional Limits ?Alignment/Gaze Preference: Gaze right ?Saccades: Decreased speed of saccadic movement;Additional eye shifts occurred during testing ?Convergence: Within functional limits ?Perception  ?Perception: Impaired ?Inattention/Neglect: Does not attend to left visual field ?Praxis ?Praxis: Intact ?Balance ?Balance ?Balance Assessed: Yes ?Static Sitting Balance ?Static Sitting - Level of Assistance: 7: Independent ?Dynamic Sitting Balance ?Dynamic Sitting - Balance Support: During functional activity;Feet supported ?Dynamic Sitting - Level of Assistance: 6: Modified independent (Device/Increase time) ?Static Standing Balance ?Static Standing - Balance Support: During functional activity ?Static Standing - Level of Assistance: 5: Stand by assistance ?Dynamic Standing Balance ?Dynamic Standing - Balance Support: During functional activity ?Dynamic Standing - Level of Assistance: 5: Stand by assistance ?Exercises: ?  ?Other Treatments:   ? ? ?Therapy/Group: Individual Therapy ? ?[name] ?08/03/2021, 12:22 PM ?

## 2021-08-03 NOTE — Progress Notes (Signed)
Occupational Therapy Discharge Summary ? ?Patient Details  ?Name: Lauren Conner ?MRN: [id number] ?Date of Birth: 1941-01-16 ? ? ? ? ?Patient has met 8 of 8 long term goals due to improved activity tolerance, improved balance, postural control, ability to compensate for deficits, improved attention, improved awareness, and improved coordination.  Pt can shower sitting on a seat (sit to stand) with supervision. PT can dress with supervision. PT can still be limited at times by orthostatic BP and is recommended to continue to wear thigh high TEDS and abdominal binder when up and moving. Pt can ambulate with supervision without a device as well as with a Rollator to provide a place to sit when BP drops. Pt's family has been educated on adapting ADLs with orthostatic BP and recommended no driving at this time. Assisted was provided for donning TEDS and abdominal binder. Pt does continue to demonstrate a left inattention at times and benefits from questionsing cues (supervision )with tasks. Patient to discharge at overall Supervision level.  Patient's care partner is independent to provide the necessary physical and cognitive assistance at discharge.   ? ?Reasons goals not met: n/a ? ?Recommendation:  ?Patient will benefit from ongoing skilled OT services in home health setting to continue to advance functional skills in the area of BADL and Reduce care partner burden. ? ?Equipment: ?Systems analyst and rollator ? ?Reasons for discharge: treatment goals met and discharge from hospital ? ?Patient/family agrees with progress made and goals achieved: Yes ? ?OT Discharge ?Precautions/Restrictions  ?Ortho static BP  ?  ?Vital Signs ?Oxygen Therapy ?SpO2: 97 % ?O2 Device: Room Air ? ?  ?ADL ?ADL ?Equipment Provided:  (RW) ?Eating: Supervision/safety ?Where Assessed-Eating:  (not observed, however, based on funcitonal status) ?Grooming: Supervision/safety ?Where Assessed-Grooming: Sitting at sink ?Upper Body Bathing:  Supervision/safety ?Where Assessed-Upper Body Bathing: Shower ?Lower Body Bathing: Supervision/safety ?Where Assessed-Lower Body Bathing: Shower ?Upper Body Dressing: Supervision/safety ?Where Assessed-Upper Body Dressing: Sitting at sink ?Lower Body Dressing: Supervision/safety ?Toileting: Supervision/safety ?Where Assessed-Toileting: Toilet ?Toilet Transfer: Close supervision ?Toilet Transfer Method: Ambulating ?Science writer: Grab bars (RW for functional mobility) ?Tub/Shower Transfer: Modified independent (using a shower bench for safety and a RW for functional mobility.) ?Tub/Shower Transfer Method: Ambulating ?Tub/Shower Equipment: Shower seat with back, Walk in shower ?Walk-In Shower Transfer: Close supervision ?Walk-In Shower Transfer Method: Ambulating ?Walk-In Shower Equipment: Civil engineer, contracting with back ?Vision ?Baseline Vision/History: 1 Wears glasses ?Patient Visual Report: Peripheral vision impairment ?Eye Alignment: Within Functional Limits ?Ocular Range of Motion: Within Functional Limits ?Alignment/Gaze Preference: Gaze right ?Saccades: Decreased speed of saccadic movement;Additional eye shifts occurred during testing ?Convergence: Within functional limits ?Perception  ?Perception: Impaired ?Inattention/Neglect: Does not attend to left visual field ?Praxis ?Praxis: Intact ?Cognition ?Cognition ?Overall Cognitive Status: Impaired/Different from baseline ?Orientation Level: Person;Place;Situation ?Person: Oriented ?Place: Oriented ?Situation: Oriented ?Memory: Impaired ?Memory Impairment: Decreased recall of new information ?Attention: Selective ?Sustained Attention: Appears intact ?Selective Attention: Appears intact ?Awareness: Appears intact ?Problem Solving: Appears intact ?Safety/Judgment: Appears intact ? ?BIMS- not completed at this time - score 99 ? ?Sensation ?Sensation ?Light Touch: Appears Intact ?Proprioception: Appears Intact ?Coordination ?Gross Motor Movements are Fluid and  Coordinated: Yes ?Fine Motor Movements are Fluid and Coordinated: Yes ?Motor  ?Motor ?Motor - Discharge Observations: improved from d/c ; limited at times by BP ?Mobility  ?Bed Mobility ?Rolling Right: Independent with assistive device ?Rolling Left: Independent with assistive device ?Supine to Sit: Independent with assistive device ?Sit to Supine: Independent with assistive device ?Transfers ?Sit to Stand: Supervision/Verbal cueing ?  Stand to Sit: Supervision/Verbal cueing  ?Trunk/Postural Assessment  ?Cervical Assessment ?Cervical Assessment: Within Functional Limits ?Thoracic Assessment ?Thoracic Assessment: Within Functional Limits ?Lumbar Assessment ?Lumbar Assessment: Within Functional Limits ?Postural Control ?Trunk Control: improved  ?Balance ?Balance ?Balance Assessed: Yes ?Static Sitting Balance ?Static Sitting - Level of Assistance: 7: Independent ?Dynamic Sitting Balance ?Dynamic Sitting - Balance Support: During functional activity;Feet supported ?Dynamic Sitting - Level of Assistance: 6: Modified independent (Device/Increase time) ?Static Standing Balance ?Static Standing - Balance Support: During functional activity ?Static Standing - Level of Assistance: 5: Stand by assistance ?Dynamic Standing Balance ?Dynamic Standing - Balance Support: During functional activity ?Dynamic Standing - Level of Assistance: 5: Stand by assistance ?Extremity/Trunk Assessment ?RUE Assessment ?RUE Assessment: Within Functional Limits ?LUE Assessment ?LUE Assessment: Within Functional Limits ? ? ?[name] ?08/03/2021, 12:18 PM ?

## 2021-08-03 NOTE — Progress Notes (Signed)
?                                                       PROGRESS NOTE ? ? ?Subjective/Complaints: ? ?Pt reports HA since midnight- couldn't receive tylenol until 7am, so doesn't know yet if it's worked for HA.  ? ?Ate 100% breakfast-but would like me to turn off lights due to HA.  ? ? ?Pt denies SOB, abd pain, CP, N/V/C/D, and vision changes ? ?Objective: ?  ?No results found. ?No results for input(s): WBC, HGB, HCT, PLT in the last 72 hours. ? ?No results for input(s): NA, K, CL, CO2, GLUCOSE, BUN, CREATININE, CALCIUM in the last 72 hours. ? ? ?Intake/Output Summary (Last 24 hours) at 08/03/2021 1047 ?Last data filed at 08/03/2021 1004 ?Gross per 24 hour  ?Intake 650 ml  ?Output 1200 ml  ?Net -550 ml  ?  ? ?  ? ?Physical Exam: ?Vital Signs ?Blood pressure (!) 146/89, pulse 84, temperature 98 ?F (36.7 ?C), temperature source Oral, resp. rate 14, height 5\' 3"  (1.6 m), weight 57.6 kg, SpO2 97 %. ? ? ? ?General: awake, alert, appropriate, laying on side in bed- looks like has HA- holding headNAD ?HENT: conjugate gaze; oropharynx moist ?CV: regular rate; no JVD ?Pulmonary: CTA B/L; no W/R/R- good air movement ?GI: soft, NT, ND, (+)BS ?Psychiatric: appropriate ?Neurological: alert ?Psych: pleasant and appropriate  ?Skin: No evidence of breakdown, no evidence of rash ?Neurologic: Alert and oriented x 3. Normal insight and awareness. Intact Memory. Normal language and speech. Cranial nerve exam unremarkable motor strength is 5/5 in bilateral deltoid, bicep, tricep, grip, hip flexor, knee extensors, ankle dorsiflexor and plantar flexor. Sensory exam normal for light touch and pain in all 4 limbs. No limb ataxia or cerebellar signs. No abnormal tone appreciated.  Kermit Balo weight shift and balance with gait using RW. ?Musculoskeletal: Full range of motion in all 4 extremities. No joint swelling ? ? ?Assessment/Plan: ?1. Functional deficits which require 3+ hours per day of interdisciplinary therapy in a comprehensive inpatient  rehab setting. ?Physiatrist is providing close team supervision and 24 hour management of active medical problems listed below. ?Physiatrist and rehab team continue to assess barriers to discharge/monitor patient progress toward functional and medical goals ? ?Care Tool: ? ?Bathing ?   ?Body parts bathed by patient: Right arm, Left arm, Chest, Abdomen, Front perineal area, Buttocks, Left upper leg, Right upper leg, Right lower leg, Left lower leg, Face  ?   ?  ?  ?Bathing assist Assist Level: Set up assist ?  ?  ?Upper Body Dressing/Undressing ?Upper body dressing   ?What is the patient wearing?: Bra, Pull over shirt ?   ?Upper body assist Assist Level: Minimal Assistance - Patient > 75% ?   ?Lower Body Dressing/Undressing ?Lower body dressing ? ? ?   ?What is the patient wearing?: Underwear/pull up, Pants ? ?  ? ?Lower body assist Assist for lower body dressing: Set up assist ?   ? ?Toileting ?Toileting Toileting Activity did not occur (Probation officer and hygiene only): N/A (no void or bm)  ?Toileting assist Assist for toileting: Minimal Assistance - Patient > 75% ?  ?  ?Transfers ?Chair/bed transfer ? ?Transfers assist ?   ? ?Chair/bed transfer assist level: Supervision/Verbal cueing ?  ?  ?Locomotion ?Ambulation ? ? ?Ambulation  assist ? ?   ? ?Assist level: Supervision/Verbal cueing ?Assistive device: Rollator ?Max distance: 300'  ? ?Walk 10 feet activity ? ? ?Assist ?   ? ?Assist level: Supervision/Verbal cueing ?Assistive device: Rollator  ? ?Walk 50 feet activity ? ? ?Assist   ? ?Assist level: Supervision/Verbal cueing ?Assistive device: Rollator  ? ? ?Walk 150 feet activity ? ? ?Assist Walk 150 feet activity did not occur: Safety/medical concerns (decreased activity tolerance) ? ?Assist level: Supervision/Verbal cueing ?Assistive device: Rollator ?  ? ?Walk 10 feet on uneven surface  ?activity ? ? ?Assist   ? ? ?Assist level: Minimal Assistance - Patient > 75% ?   ? ?Wheelchair ? ? ? ? ?Assist Is the  patient using a wheelchair?: Yes ?Type of Wheelchair: Manual ?  ? ?Wheelchair assist level: Minimal Assistance - Patient > 75%, Supervision/Verbal cueing ?Max wheelchair distance: >150 ft  ? ? ?Wheelchair 50 feet with 2 turns activity ? ? ? ?Assist ? ?  ?  ? ? ?Assist Level: Supervision/Verbal cueing  ? ?Wheelchair 150 feet activity  ? ? ? ?Assist ?   ? ? ?Assist Level: Minimal Assistance - Patient > 75%  ? ?Blood pressure (!) 146/89, pulse 84, temperature 98 ?F (36.7 ?C), temperature source Oral, resp. rate 14, height 5\' 3"  (1.6 m), weight 57.6 kg, SpO2 97 %. ? ?Medical Problem List and Plan: ?1. 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 ?            -patient may shower ?            -ELOS/Goals: 10-14 days S to mod I ?          -Continue CIR- PT, OT and SLP ? ?2.  Antithrombotics: ?-DVT/anticoagulation:  Mechanical: Antiembolism stockings, thigh (TED hose) Bilateral lower extremities ?            -antiplatelet therapy: N/A ?3. Pain Management: Tylenol as needed--change to liquid form and schedule tid ? 3/19- if HA continues, might need preventative or something stronger? ?4. Mood: Provide emotional support ?            -antipsychotic agents: N/A ?5. Neuropsych: This patient is capable of making decisions on her own behalf. ?6. Skin/Wound Care: Routine skin checks ?7. Fluids/Electrolytes/Nutrition: encourage po ? Good po intake now  .   ?8.  CAP with baseline bronchiectasis.  Follow-up PCCM.   ?-Complete 14-day course Levaquin initiated 07/23/2021.   ?-Continue inhalers as directed ?9.  Acute urinary retention with significant bilateral hydronephrosis.  Follow-up 07/23/2021 Dr. Diona Fanti.  Recommend CONTINUE FOLEY CATHETER TUBE  and Flomax for at least 2 weeks and follow-up outpatient.  Follow-up renal ultrasound 07/24/2021 showed resolved bilateral hydronephrosis ? -continue per above  ?10.  AKI on CKD stage IV.  Patient followed by  Dr.Lehrich at [facility] with baseline creatinine 2.0.  Currently seen by Dr. Moshe Cipro while at [facility].  Currently with worsening of renal function suspect related to BP variability with amlodipine and metoprolol discontinued.    ? -  Cr near her current baseline of 2.5 ? -continue to monitor, good urine output ?11.  Hypertension.    Lopressor currently discontinued.   ?Vitals:  ? 08/03/21 0446 08/03/21 0913  ?BP: (!) 146/89   ?Pulse: 84   ?Resp: 14   ?Temp: 98 ?F (36.7 ?C)   ?SpO2: 98% 97%  ? -significant orthostasis yesterday---only on small dose norvasc. Also taking  flomax----resting bp's still high ? -acclimation, TEDS ? -needs appropriately sized abd binder---will check with [facility] ? -encourage adequate fluids ? -change timing of norvasc to bedtime to help with am htn ? 3/17 -continue low dose trial of midodrine in am and lunchtime ?         -free T4 on 0.9, will adjust synthroid to 145mcg daily ? 3/19- BP slightly high to normal- less orthostasis- con't regimen ?12.  Hypothyroidism: continue Synthroid--see above ?13.  History of cardiac dysfunction with SVT.  Cardiac rate controlled. Monitor HR TID.  ?14.  Anemia of chronic disease.  Transfused 2 units 07/25/2021 patient had been receiving Aranesp every 2 weeks ?  3/13 hgb up to 11.1, stable--recheck monday ?15. Slow transit constipation: ? --continue senna and miralax daily ?  -3/19- LBM this AM ? ?LOS: ?8 days ?A FACE TO FACE EVALUATION WAS PERFORMED ? ?Sylvain Hasten ?08/03/2021, 10:47 AM  ? ? ? ?

## 2021-08-04 LAB — CBC
HCT: 32.1 % — ABNORMAL LOW (ref 36.0–46.0)
Hemoglobin: 10.5 g/dL — ABNORMAL LOW (ref 12.0–15.0)
MCH: 30.8 pg (ref 26.0–34.0)
MCHC: 32.7 g/dL (ref 30.0–36.0)
MCV: 94.1 fL (ref 80.0–100.0)
Platelets: 235 10*3/uL (ref 150–400)
RBC: 3.41 MIL/uL — ABNORMAL LOW (ref 3.87–5.11)
RDW: 15.3 % (ref 11.5–15.5)
WBC: 5.6 10*3/uL (ref 4.0–10.5)
nRBC: 0 % (ref 0.0–0.2)

## 2021-08-04 LAB — BASIC METABOLIC PANEL
Anion gap: 9 (ref 5–15)
BUN: 34 mg/dL — ABNORMAL HIGH (ref 8–23)
CO2: 21 mmol/L — ABNORMAL LOW (ref 22–32)
Calcium: 8.1 mg/dL — ABNORMAL LOW (ref 8.9–10.3)
Chloride: 107 mmol/L (ref 98–111)
Creatinine, Ser: 2.62 mg/dL — ABNORMAL HIGH (ref 0.44–1.00)
GFR, Estimated: 18 mL/min — ABNORMAL LOW (ref >60)
Glucose, Bld: 81 mg/dL (ref 70–99)
Potassium: 4.1 mmol/L (ref 3.5–5.1)
Sodium: 137 mmol/L (ref 135–145)

## 2021-08-04 MED ORDER — TOPIRAMATE 25 MG PO TABS
25.0000 mg | ORAL_TABLET | Freq: Every day | ORAL | Status: DC
  Administered 2021-08-04: 25 mg via ORAL
  Filled 2021-08-04: qty 1

## 2021-08-04 MED ORDER — ACETAMINOPHEN 325 MG PO TABS: 650.0000 mg | ORAL_TABLET | Freq: Four times a day (QID) | ORAL | Status: AC | PRN

## 2021-08-04 MED ORDER — ALBUTEROL SULFATE HFA 108 (90 BASE) MCG/ACT IN AERS: 2.0000 | INHALATION_SPRAY | RESPIRATORY_TRACT | 0 refills | Status: AC | PRN

## 2021-08-04 MED ORDER — TAMSULOSIN HCL 0.4 MG PO CAPS: 0.4000 mg | ORAL_CAPSULE | Freq: Every day | ORAL | 0 refills | Status: AC

## 2021-08-04 MED ORDER — TOPIRAMATE 25 MG PO TABS
25.0000 mg | ORAL_TABLET | Freq: Once | ORAL | Status: AC
  Administered 2021-08-04: 25 mg via ORAL
  Filled 2021-08-04: qty 1

## 2021-08-04 MED ORDER — SODIUM BICARBONATE 650 MG PO TABS: 650.0000 mg | ORAL_TABLET | Freq: Three times a day (TID) | ORAL | 0 refills | Status: AC

## 2021-08-04 MED ORDER — LORATADINE 10 MG PO TABS: 10.0000 mg | ORAL_TABLET | Freq: Every day | ORAL | 0 refills | Status: DC

## 2021-08-04 MED ORDER — PANTOPRAZOLE SODIUM 40 MG PO TBEC: 40.0000 mg | DELAYED_RELEASE_TABLET | Freq: Every day | ORAL | 0 refills | Status: AC

## 2021-08-04 MED ORDER — LEVOTHYROXINE SODIUM 100 MCG PO TABS: 100.0000 ug | ORAL_TABLET | Freq: Every day | ORAL | 0 refills | Status: AC

## 2021-08-04 MED ORDER — TOPIRAMATE 25 MG PO TABS: 25.0000 mg | ORAL_TABLET | Freq: Every day | ORAL | 0 refills | Status: DC

## 2021-08-04 MED ORDER — AMLODIPINE BESYLATE 2.5 MG PO TABS: 2.5000 mg | ORAL_TABLET | Freq: Every day | ORAL | 0 refills | Status: DC

## 2021-08-04 MED ORDER — ARFORMOTEROL TARTRATE 15 MCG/2ML IN NEBU: 15.0000 ug | INHALATION_SOLUTION | Freq: Two times a day (BID) | RESPIRATORY_TRACT | 0 refills | Status: AC

## 2021-08-04 MED ORDER — REVEFENACIN 175 MCG/3ML IN SOLN: 175.0000 ug | Freq: Every day | RESPIRATORY_TRACT | 0 refills | Status: DC

## 2021-08-04 MED ORDER — MIDODRINE HCL 2.5 MG PO TABS: 2.5000 mg | ORAL_TABLET | Freq: Two times a day (BID) | ORAL | 0 refills | Status: DC

## 2021-08-04 NOTE — Progress Notes (Addendum)
Physical Therapy Discharge Summary ? ?Patient Details  ?Name: Lauren Conner ?MRN: [id number] ?Date of Birth: 08/07/1940 ? ?Today's Date: 08/04/2021 ? ? ?Patient has met 4 of 9 (5 goals adequate for d/c) long term goals due to improved activity tolerance, improved balance, improved postural control, increased strength, decreased pain, ability to compensate for deficits, improved attention, improved awareness, and improved coordination.  Patient to discharge at an ambulatory level Supervision-Modified independent using a Rollator.   Patient's care partner is independent to provide the necessary physical and cognitive assistance at discharge. ? ?Reasons goals not met: Patient is performing ambulating at supervision level due to labile vitals and L inattention >300 feet using a Rollator. Unable to perform >300 feet on 6MWT during d/c assessment due to elevated HR. Patient requires supervision for car transfer for min cues for safety. Patient's family has demonstrated their ability to provide this level of assist and reports the patient will have 24/7 assist at d/c.  ? ?Recommendation:  ?Patient will benefit from ongoing skilled PT services in outpatient setting to continue to advance safe functional mobility, address ongoing impairments in balance, activity tolerance, management of orthostatic hypotension, functional mobility, gait and stair training, community integration, patient/caregiver education, and minimize fall risk. ? ?Equipment: ?Rollator ? ?Reasons for discharge: treatment goals met ? ?Patient/family agrees with progress made and goals achieved: Yes ? ?PT Discharge ?Precautions/Restrictions ?Precautions ?Precautions: Fall ?Precaution Comments: orthostatic hypotension, don thigh high TED hose and abdominal binder before OOB mobility ?Restrictions ?Weight Bearing Restrictions: No ?Pain Interference ?Pain Interference ?Pain Effect on Sleep: 3. Frequently (headaches keep pt awake at night) ?Pain Interference with  Therapy Activities: 1. Rarely or not at all;2. Occasionally (when unable to sleep) ?Pain Interference with Day-to-Day Activities: 1. Rarely or not at all ?Vision/Perception  ?Vision - History ?Ability to See in Adequate Light: 0 Adequate ?Vision - Assessment ?Eye Alignment: Within Functional Limits ?Ocular Range of Motion: Within Functional Limits ?Tracking/Visual Pursuits: Able to track stimulus in all quads without difficulty ?Saccades: Decreased speed of saccadic movement;Additional eye shifts occurred during testing ?Perception ?Perception: Impaired ?Inattention/Neglect: Does not attend to left visual field ?Praxis ?Praxis: Intact  ?Cognition ?Overall Cognitive Status: Impaired/Different from baseline ?Arousal/Alertness: Awake/alert ?Orientation Level: Oriented X4 ?Attention: Selective ?Sustained Attention: Appears intact ?Selective Attention: Appears intact ?Memory: Impaired ?Memory Impairment: Decreased recall of new information ?Awareness: Appears intact ?Problem Solving: Appears intact ?Safety/Judgment: Appears intact ?Sensation ?Sensation ?Light Touch: Appears Intact ?Hot/Cold: Appears Intact ?Proprioception: Appears Intact ?Stereognosis: Not tested ?Coordination ?Gross Motor Movements are Fluid and Coordinated: No ?Fine Motor Movements are Fluid and Coordinated: Yes ?Coordination and Movement Description: generalized deconditioning and decreased postural control, improving ?Heel Shin Test: Slow and deliberate bilaterally ?Motor  ?Motor ?Motor: Abnormal postural alignment and control ?Motor - Discharge Observations: improved from admission, continued postural control deficits; limited at times by BP  ?Mobility ?Bed Mobility ?Rolling Right: Independent ?Rolling Left: Independent ?Supine to Sit: Independent ?Sit to Supine: Independent ?Transfers ?Transfers: Sit to Stand;Stand to Sit;Stand Pivot Transfers ?Sit to Stand: Independent with assistive device ?Stand to Sit: Independent with assistive device ?Stand  Pivot Transfers: Independent with assistive device ?Transfer (Assistive device): 4-wheeled walker ?Locomotion  ?Gait ?Gait Assistance: Supervision/Verbal cueing ?Gait Distance (Feet): 300 Feet ?Assistive device: 4-wheeled walker ?Gait Assistance Details: Verbal cues for precautions/safety;Verbal cues for technique ?Gait ?Gait Pattern: Impaired ?Gait Pattern: Decreased stride length;Decreased hip/knee flexion - left;Decreased hip/knee flexion - right;Lateral hip instability;Decreased trunk rotation;Narrow base of support ?Gait velocity: 0.7 m/s avg on 6MWT ?Stairs / Additional Locomotion ?  Stairs Assistance: Contact Guard/Touching assist ?Stair Management Technique: No rails ?Number of Stairs: 4 ?Height of Stairs: 6 ?Ramp: Supervision/Verbal cueing (with Rollator) ?Curb: Contact Guard/Touching assist ?Wheelchair Mobility ?Wheelchair Mobility: No  ?Trunk/Postural Assessment  ?Cervical Assessment ?Cervical Assessment: Within Functional Limits ?Thoracic Assessment ?Thoracic Assessment: Within Functional Limits ?Lumbar Assessment ?Lumbar Assessment: Within Functional Limits ?Postural Control ?Postural Control: Deficits on evaluation (decreased/delayed) ?Trunk Control: improving  ?Balance ?Standardized Balance Assessment ?Standardized Balance Assessment: Berg Balance Test ?Merrilee Jansky Balance Test ?Sit to Stand: Able to stand without using hands and stabilize independently ?Standing Unsupported: Able to stand safely 2 minutes (able to regulate symtpoms in standing) ?Sitting with Back Unsupported but Feet Supported on Floor or Stool: Able to sit safely and securely 2 minutes ?Stand to Sit: Sits safely with minimal use of hands ?Transfers: Able to transfer safely, minor use of hands ?Standing Unsupported with Eyes Closed: Able to stand 10 seconds safely ?Standing Ubsupported with Feet Together: Able to place feet together independently and stand 1 minute safely ?From Standing, Reach Forward with Outstretched Arm: Can reach forward  >12 cm safely (5") ?From Standing Position, Pick up Object from Floor: Able to pick up shoe safely and easily ?From Standing Position, Turn to Look Behind Over each Shoulder: Turn sideways only but maintains balance ?Turn 360 Degrees: Able to turn 360 degrees safely but slowly ?Standing Unsupported, Alternately Place Feet on Step/Stool: Able to stand independently and complete 8 steps >20 seconds ?Standing Unsupported, One Foot in Front: Able to plae foot ahead of the other independently and hold 30 seconds ?Standing on One Leg: Tries to lift leg/unable to hold 3 seconds but remains standing independently ?Total Score: 46 ?Static Sitting Balance ?Static Sitting - Balance Support: No upper extremity supported;Feet supported ?Static Sitting - Level of Assistance: 7: Independent ?Dynamic Sitting Balance ?Dynamic Sitting - Balance Support: During functional activity;Feet supported ?Dynamic Sitting - Level of Assistance: 7: Independent ?Static Standing Balance ?Static Standing - Balance Support: No upper extremity supported ?Static Standing - Level of Assistance: 7: Independent ?Dynamic Standing Balance ?Dynamic Standing - Balance Support: No upper extremity supported;During functional activity ?Dynamic Standing - Level of Assistance: 6: Modified independent (Device/Increase time) ?Extremity Assessment  ?RLE Assessment ?RLE Assessment: Within Functional Limits ?Active Range of Motion (AROM) Comments: WFL for all functional mobility ?General Strength Comments: Grossly 5/5 throughout except hip flexion/abduction 4/5 ?LLE Assessment ?LLE Assessment: Within Functional Limits ?Active Range of Motion (AROM) Comments: WFL for all functional mobility ?General Strength Comments: Grossly 5/5 throughout except hip flexion/abduction 4/5 ? ? ? ?Doreene Burke PT, DPT ? ?08/04/2021, 4:00 PM ?

## 2021-08-04 NOTE — Progress Notes (Signed)
Speech Language Pathology Daily Session Note ? ?Patient Details  ?Name: Lauren Conner ?MRN: [id number] ?Date of Birth: [birth year] ? ?Today's Date: 08/04/2021 ?SLP Individual Time: 8682-5749 ?SLP Individual Time Calculation (min): 45 min ? ?Short Term Goals: ?Week 1: SLP Short Term Goal 1 (Week 1): STG=LTG due to ELOS ? ?Skilled Therapeutic Interventions: ?Pt seen this date for skilled ST intervention targeting cognitive skills. Pt alert/awake and OOB in wc upon SLP arrival. Agreeable to ST intervention in pt's room.  ? ?SLP administered SLUMS assessment to determine further needs following discharge. This date, pt demonstrated ongoing deficits in recall of novel, non-functional information, problem-solving, and working Marine scientist. Pt benefited from Mod verbal and visual cues to complete functional math calculation task with bills and coins (providing pre-determined dollar amounts and determining change). Reinforced the importance of 24/7 supervision and assistance, particularly with iADL tasks to include medication + financial management, cooking, driving, and meal preparation; she verbalized understanding. Conversely, pt demonstrated functional recall of precautions for BP management to include donning abdominal binder and TED hose. Verbally sequenced steps to transferring from bed to rollator without difficulty.  ? ?At the end of session, pt left in wc with chair alarm on, call bell reviewed and within reach, and all immediate needs addressed. Pt to discharge from [place] due to d/c set for 08/05/2021.  ? ?Pain ?No/Denies pain ? ?Therapy/Group: Individual Therapy ? ?Lorimer Tiberio A Harry Bark ?08/04/2021, 1:52 PM ?

## 2021-08-04 NOTE — Progress Notes (Signed)
Physical Therapy Session Note ? ?Patient Details  ?Name: Lauren Conner ?MRN: [id number] ?Date of Birth: [birth year] ? ?Today's Date: 08/04/2021 ?PT Individual Time: 2130-8657 ?PT Individual Time Calculation (min): 105 min  ? ?Short Term Goals: ?Week 1:  PT Short Term Goal 1 (Week 1): STG=LTG due to ELOS ? ?Skilled Therapeutic Interventions/Progress Updates:  ?   ?Patient in w/c in the room upon PT arrival. Patient alert and agreeable to PT session, starting early due to patient with headache this morning. Patient denied pain during session. ? ?Patient with increased SOB and fatigue with mobility this session. HR 110-125 bpm in sitting and with activity, BP WNL with thigh high TED hose and abdominal binder donned prior to and throughout session.  ? ?Therapeutic Activity: ?Bed Mobility: Patient performed supine to/form sit and rolling R/L independently in a flat bed without use of bed rails. ?Transfers: Patient performed sit to/from stand use Rollator from the bed, mat table x5, standard arm chair x2, and Rollator seat x2 with supervision progressing to mod I. Provided verbal cues for use of breaks and management of catheter x2. ?Patient performed a simulated sedan height car transfer with supervision using a Rollator. Provided min cues for safe technique. ? ?Gait Training:  ?Patient ambulated >100 feet x2 and >150 feet x1 using a Rollator with supervision. Ambulated with decreased gait speed, decreased step length and height, forward trunk lean, and downward head gaze. Provided min-mod verbal cues for erect posture, attention and visual scanning to the L, path finding, and increased step height. ?Patient ambulated up/down a ramp, over 10 feet of mulch (unlevel surface), and up/down a curb to simulate community ambulation over unlevel surfaces with CGA using a Rollator. Provided cues for technique and use of AD. Required min A for lifting Rollator up/down curb. ?Patient ascended/descended 4x6 steps without rails with CGA.  Performed step-to gait pattern throughout. Provided min cues for technique and sequencing.  ?6 Min Walk Test:  ?Instructed patient to ambulate as quickly and as safely as possible for 6 minutes using LRAD. Patient was allowed to take standing rest breaks without stopping the test, but if the patient required a sitting rest break the clock would be stopped and the test would be over.  ?Results: 180 feet (54.8 meters, Avg speed 0.7 m/s) using a Rollator with supervision. Results indicate that the patient has reduced endurance with ambulation compared to age matched norms.  ?Age Matched Norms:  58-79 yo F: 471 meters ?MDC: 58.21 meters (190.98 feet) or 50 meters ?(ANPTA Core Set of Outcome Measures for Adults with Neurologic Conditions, 2018) ?**Patient limited by SOB and fatigue with gait training today ?Vitals Before Test: BP 103/68 HR 115 ?Vitals After Test: BP 113/62, HR 122 (recovered to 112 <1 min) ? ?Neuromuscular Re-ed: ?Berg Balance Test ?Sit to Stand: Able to stand without using hands and stabilize independently ?Standing Unsupported: Able to stand safely 2 minutes (able to regulate symtpoms in standing) ?Sitting with Back Unsupported but Feet Supported on Floor or Stool: Able to sit safely and securely 2 minutes ?Stand to Sit: Sits safely with minimal use of hands ?Transfers: Able to transfer safely, minor use of hands ?Standing Unsupported with Eyes Closed: Able to stand 10 seconds safely ?Standing Ubsupported with Feet Together: Able to place feet together independently and stand 1 minute safely ?From Standing, Reach Forward with Outstretched Arm: Can reach forward >12 cm safely (5") ?From Standing Position, Pick up Object from Floor: Able to pick up shoe safely and easily ?From  Standing Position, Turn to Look Behind Over each Shoulder: Turn sideways only but maintains balance ?Turn 360 Degrees: Able to turn 360 degrees safely but slowly ?Standing Unsupported, Alternately Place Feet on Step/Stool: Able to  stand independently and complete 8 steps >20 seconds ?Standing Unsupported, One Foot in Front: Able to plae foot ahead of the other independently and hold 30 seconds ?Standing on One Leg: Tries to lift leg/unable to hold 3 seconds but remains standing independently ?Total Score: 46/56 (improved from 36/56 on 3/12) ?Patient demonstrated increased fall risk noted by score of 46/56 on the [facility] Scale.  ?<45/56 = fall risk, <42/56 = predictive of recurrent falls, <40/56 = 100% fall risk  ?>41 = independent, 21-40 = assistive device, 0-20 = wheelchair level  ?MDC 6.9 (4 pts 45-56, 5 pts 35-44, 7 pts 25-34) ?(ANPTA Core Set of Outcome Measures for Adults with Neurologic Conditions, 2018) ?Five times Sit to Stand Test (FTSS) ?Method: ?Use a straight back chair with a solid seat that is 17-18? high. Ask participant to sit on the chair with arms folded across their chest.   ?Instructions: ??Stand up and sit down as quickly as possible 5 times, keeping your arms folded across your chest.?   ?Measurement: ?Stop timing when the participant touches the chair in sitting the 5th time. ?TIME: 22.4 sec (improved from 0/unable to perform on 3/12) ?Cut off scores indicative of increased fall risk: >12 sec CVA, >16 sec PD, >13 sec vestibular ?(ANPTA Core Set of Outcome Measures for Adults with Neurologic Conditions, 2018) ? ?Used teach-back method to review fall risk/prevention, home modifications to prevent falls, and activation of emergency services in the event of a fall during session. Patient reports her family has set her up with a service that allows her to call 911 via a device around her neck for increased safety and access.  ? ?Patient in bed at end of session with breaks locked, bed alarm set, and all needs within reach. Assisted patient with ordering dinner and breakfast, required max cues for making selections and total A for phone management.  ? ?Therapy Documentation ?Precautions:  ?Precautions ?Precautions:  Fall ?Precaution Comments: orthostatic hypotension, don thigh high TED hose and abdominal binder before OOB mobility ?Restrictions ?Weight Bearing Restrictions: No ?General: ?PT Amount of Missed Time (min): 15 Minutes ?PT Missed Treatment Reason: Pain (headache) ? ? ? ?Therapy/Group: Individual Therapy ? ?Doreene Burke PT, DPT ? ?08/04/2021, 3:42 PM  ?

## 2021-08-04 NOTE — Progress Notes (Signed)
?                                                       PROGRESS NOTE ? ? ?Subjective/Complaints: ? ?Struggled with a h/a again last night. Didn't sleep enough because of it. Feels pressure behind eyes. Otherwise had been doing well with therapies prior to this morning. Will try again with therapy later today.   ? ?Objective: ?  ?No results found. ?Recent Labs  ?  08/04/21 ?0624  ?WBC 5.6  ?HGB 10.5*  ?HCT 32.1*  ?PLT 235  ? ? ?Recent Labs  ?  08/04/21 ?0624  ?NA 137  ?K 4.1  ?CL 107  ?CO2 21*  ?GLUCOSE 81  ?BUN 34*  ?CREATININE 2.62*  ?CALCIUM 8.1*  ? ? ? ?Intake/Output Summary (Last 24 hours) at 08/04/2021 1030 ?Last data filed at 08/04/2021 5573 ?Gross per 24 hour  ?Intake 120 ml  ?Output 2475 ml  ?Net -2355 ml  ?  ? ?  ? ?Physical Exam: ?Vital Signs ?Blood pressure (!) 142/91, pulse 87, temperature 98.1 ?F (36.7 ?C), temperature source Oral, resp. rate 15, height 5\' 3"  (1.6 m), weight 57.6 kg, SpO2 98 %. ? ? ? ?Constitutional: No distress . Vital signs reviewed. Sleeping/ has shades drawn in room.  ?HEENT: NCAT, EOMI, oral membranes moist ?Neck: supple ?Cardiovascular: RRR without murmur. No JVD    ?Respiratory/Chest: CTA Bilaterally without wheezes or rales. Normal effort    ?GI/Abdomen: BS +, non-tender, non-distended ?Ext: no clubbing, cyanosis, or edema ?Psych: pleasant and cooperative as always  ?Skin: No evidence of breakdown, no evidence of rash ?Neurologic: awakened easily. Became Alert and oriented x 3. Reasonable insight and awareness. Intact Memory. Normal language and speech. Cranial nerve exam unremarkable motor strength is 5/5 in bilateral deltoid, bicep, tricep, grip, hip flexor, knee extensors, ankle dorsiflexor and plantar flexor. Sensory exam normal for light touch and pain in all 4 limbs.   ?Musculoskeletal: Full range of motion in all 4 extremities. No joint swelling ? ? ?Assessment/Plan: ?1. Functional deficits which require 3+ hours per day of interdisciplinary therapy in a comprehensive  inpatient rehab setting. ?Physiatrist is providing close team supervision and 24 hour management of active medical problems listed below. ?Physiatrist and rehab team continue to assess barriers to discharge/monitor patient progress toward functional and medical goals ? ?Care Tool: ? ?Bathing ?   ?Body parts bathed by patient: Right arm, Left arm, Chest, Abdomen, Front perineal area, Buttocks, Left upper leg, Right upper leg, Right lower leg, Left lower leg, Face  ?   ?  ?  ?Bathing assist Assist Level: Supervision/Verbal cueing ?  ?  ?Upper Body Dressing/Undressing ?Upper body dressing   ?What is the patient wearing?: Bra, Pull over shirt ?   ?Upper body assist Assist Level: Supervision/Verbal cueing ?   ?Lower Body Dressing/Undressing ?Lower body dressing ? ? ?   ?What is the patient wearing?: Underwear/pull up, Pants ? ?  ? ?Lower body assist Assist for lower body dressing: Supervision/Verbal cueing ?   ? ?Toileting ?Toileting Toileting Activity did not occur (Probation officer and hygiene only): N/A (no void or bm)  ?Toileting assist Assist for toileting: Supervision/Verbal cueing ?  ?  ?Transfers ?Chair/bed transfer ? ?Transfers assist ?   ? ?Chair/bed transfer assist level: Supervision/Verbal cueing ?  ?  ?Locomotion ?Ambulation ? ? ?  Ambulation assist ? ?   ? ?Assist level: Supervision/Verbal cueing ?Assistive device: Rollator ?Max distance: 300'  ? ?Walk 10 feet activity ? ? ?Assist ?   ? ?Assist level: Supervision/Verbal cueing ?Assistive device: Rollator  ? ?Walk 50 feet activity ? ? ?Assist   ? ?Assist level: Supervision/Verbal cueing ?Assistive device: Rollator  ? ? ?Walk 150 feet activity ? ? ?Assist Walk 150 feet activity did not occur: Safety/medical concerns (decreased activity tolerance) ? ?Assist level: Supervision/Verbal cueing ?Assistive device: Rollator ?  ? ?Walk 10 feet on uneven surface  ?activity ? ? ?Assist   ? ? ?Assist level: Minimal Assistance - Patient > 75% ?    ? ?Wheelchair ? ? ? ? ?Assist Is the patient using a wheelchair?: Yes ?Type of Wheelchair: Manual ?  ? ?Wheelchair assist level: Minimal Assistance - Patient > 75%, Supervision/Verbal cueing ?Max wheelchair distance: >150 ft  ? ? ?Wheelchair 50 feet with 2 turns activity ? ? ? ?Assist ? ?  ?  ? ? ?Assist Level: Supervision/Verbal cueing  ? ?Wheelchair 150 feet activity  ? ? ? ?Assist ?   ? ? ?Assist Level: Minimal Assistance - Patient > 75%  ? ?Blood pressure (!) 142/91, pulse 87, temperature 98.1 ?F (36.7 ?C), temperature source Oral, resp. rate 15, height 5\' 3"  (1.6 m), weight 57.6 kg, SpO2 98 %. ? ?Medical Problem List and Plan: ?1. 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 ?            -patient may shower ?            -ELOS/Goals: 10-14 days S to mod I ?         -Continue CIR therapies including PT, OT, and SLP  ? ?2.  Antithrombotics: ?-DVT/anticoagulation:  Mechanical: Antiembolism stockings, thigh (TED hose) Bilateral lower extremities ?            -antiplatelet therapy: N/A ?3. Pain Management/headaches: Tylenol scheduled tid--no longer covering pain ? 3/20 persistent headaches. --will begin trial of topamax, 25mg  now and then qhs ?4. Mood: Provide emotional support ?            -antipsychotic agents: N/A ?5. Neuropsych: This patient is capable of making decisions on her own behalf. ?6. Skin/Wound Care: Routine skin checks ?7. Fluids/Electrolytes/Nutrition: encourage po ? Good po intake now  .   ?8.  CAP with baseline bronchiectasis.  Follow-up PCCM.   ?-Complete 14-day course Levaquin initiated 07/23/2021.   ?-Continue inhalers as directed ?9.  Acute urinary retention with significant bilateral hydronephrosis.  Follow-up 07/23/2021 Dr. Diona Fanti.  Recommend CONTINUE FOLEY CATHETER TUBE  and Flomax for at least 2 weeks and follow-up outpatient.  Follow-up renal ultrasound 07/24/2021 showed resolved bilateral  hydronephrosis ? -foley stays in 3/20  ?10.  AKI on CKD stage IV.  Patient followed by Dr.Lehrich at [facility] with baseline creatinine 2.0.  Currently seen by Dr. Moshe Cipro while at [facility].  Currently with worsening of renal function suspect related to BP variability with amlodipine and metoprolol discontinued.    ? -  Cr in range of 2.4-2.6 ? -continue to monitor, good urine output ?11.  Hypertension.    Lopressor currently discontinued.   ?Vitals:  ? 08/04/21 0745 08/04/21 0846  ?BP: (!) 142/91   ?Pulse: 87   ?Resp: 15   ?Temp: 98.1 ?F (36.7 ?C)   ?SpO2: 98% 98%  ? - -acclimation, TEDS ? -wearing binder as  tight as possible/tolerable ? -changed timing of norvasc to bedtime to help with am htn ? 3/20 -continue low dose trial of midodrine in am and lunchtime ?         -free T4 on 0.9, adjusted synthroid to 170mcg daily ?        -orthostasis seems improved ?        -tolerate sl increase in resting bp ?12.  Hypothyroidism: continue Synthroid--see above ?13.  History of cardiac dysfunction with SVT.  Cardiac rate controlled. Monitor HR TID.  ?14.  Anemia of chronic disease.  Transfused 2 units 07/25/2021 patient had been receiving Aranesp every 2 weeks ?  3/13 hgb up to 11.1,--->10.5 3/20 ? -no gross bleeding ?15. Slow transit constipation: ? --continue senna and miralax daily ?  -3/19- LBM   ? ?LOS: ?9 days ?A FACE TO FACE EVALUATION WAS PERFORMED ? ?Meredith Staggers ?08/04/2021, 10:30 AM  ? ? ? ?

## 2021-08-04 NOTE — Progress Notes (Signed)
Speech Language Pathology Discharge Summary ? ?Patient Details  ?Name: Lauren Conner ?MRN: [id number] ?Date of Birth: June 21, 1940 ? ?Today's Date: 08/04/2021 ?SLP Individual Time: 8841-6606 ?SLP Individual Time Calculation (min): 45 min ? ? ?Skilled Therapeutic Interventions:  During CIR admission, pt met 1 out of 2 long-term goals for cognition. Per administration of SLUMS assessment this date, pt presents with ongoing deficits in recall of novel information, working memory, and complex problem-solving re: mental math calculations. With extra processing time and Min-Mod verbal and visual A, pt completed functional money task with 100% accuracy. Reinforced need for 24 hour supervision and assistance with iADL tasks, as well as follow-up with ST at OP level of care to maximize her independence and life participation, and decrease caregiver burden. Education completed. Pt to be discharged this date.  ? ?Patient has met 1 of 2 long term goals.  Patient to discharge at overall Modified Independent level.  ?Reasons goals not met: Pt requiring Mod vebral and visual cues to complete complex funtional iADL tasksN/A ? ?Clinical Impression/Discharge Summary:    ?Care Partner:  ?Caregiver Able to Provide Assistance: Yes  ?Type of Caregiver Assistance: Physical;Cognitive ? ?Recommendation:  ?Outpatient SLP  ?Rationale for SLP Follow Up: Maximize functional communication;Maximize cognitive function and independence;Reduce caregiver burden  ? ?Equipment: N/A  ? ?Reasons for discharge: Pt has been medically cleared for discharged ? ?Patient/Family Agrees with Progress Made and Goals Achieved: Yes  ? ? ?Lauren Conner A Lauren Conner ?08/04/2021, 1:38 PM ? ?

## 2021-08-04 NOTE — Progress Notes (Shared)
Occupational Therapy Discharge Summary ? ?Patient Details  ?Name: Lauren Conner ?MRN: [id number] ?Date of Birth: 1941-03-29 ? ?{CHL IP REHAB OT TIME CALCULATIONS:304400400} ? ? ?Patient has met {NUMBERS 0-12:18577} of {NUMBERS 0-12:18577} long term goals due to {due JY:7829562}.  Patient to discharge at overall {LOA:3049010} level.  Patient's care partner {care partner:3041650} to provide the necessary {assistance:3041652} assistance at discharge.   ? ?Reasons goals not met: *** ? ?Recommendation:  ?Patient will benefit from ongoing skilled OT services in {setting:3041680} to continue to advance functional skills in the area of {ADL/iADL:3041649}. ? ?Equipment: ?{equipment:3041657} ? ?Reasons for discharge: {Reason for discharge:3049018} ? ?Patient/family agrees with progress made and goals achieved: {Pt/Family agree with progress/goals:3049020} ? ?OT Discharge ?Precautions/Restrictions  ?Restrictions ?Weight Bearing Restrictions: No ?General ?  ?Vital Signs ?Therapy Vitals ?Temp: 98 ?F (36.7 ?C) ?Temp Source: Oral ?Pulse Rate: 88 ?Resp: 18 ?BP: (!) 142/87 (Notified Nurse [place]) ?Patient Position (if appropriate): Sitting ?Oxygen Therapy ?SpO2: 99 % ?O2 Device: Room Air ?Pain ?Pain Assessment ?Pain Scale: 0-10 ?Pain Score: 0-No pain ?ADL ?ADL ?Equipment Provided:  (RW) ?Eating: Supervision/safety ?Where Assessed-Eating:  (not observed, however, based on funcitonal status) ?Grooming: Supervision/safety ?Where Assessed-Grooming: Standing at sink ?Upper Body Bathing: Setup ?Where Assessed-Upper Body Bathing: Sitting at sink, Standing at sink ?Lower Body Bathing: Supervision/safety ?Where Assessed-Lower Body Bathing: Standing at sink, Sitting at sink ?Upper Body Dressing: Supervision/safety (cueing for abdominal binder) ?Where Assessed-Upper Body Dressing: Sitting at sink ?Lower Body Dressing: Supervision/safety ?Where Assessed-Lower Body Dressing: Sitting at sink, Standing at sink ?Toileting: Supervision/safety ?Where  Assessed-Toileting: Toilet ?Toilet Transfer: Close supervision ?Toilet Transfer Method: Ambulating ?Science writer: Grab bars ?Tub/Shower Transfer: Modified independent ?Tub/Shower Transfer Method: Ambulating ?Tub/Shower Equipment: Shower seat with back, Walk in shower ?Walk-In Shower Transfer: Close supervision ?Walk-In Shower Transfer Method: Ambulating ?Walk-In Shower Equipment: Civil engineer, contracting with back ?Vision ?Baseline Vision/History: 1 Wears glasses ?Patient Visual Report: Peripheral vision impairment ?Eye Alignment: Within Functional Limits ?Ocular Range of Motion: Within Functional Limits ?Alignment/Gaze Preference: Gaze right ?Tracking/Visual Pursuits: Able to track stimulus in all quads without difficulty ?Saccades: Decreased speed of saccadic movement;Additional eye shifts occurred during testing ?Convergence: Within functional limits ?Visual Fields: Left visual field deficit ?Perception  ?Inattention/Neglect: Other (comment) (reduced attention to left environment) ?Praxis ?Praxis: Intact ?Cognition ?Cognition ?Arousal/Alertness: Awake/alert ?Orientation Level: Person;Place;Situation ?Person: Oriented ?Memory: Appears intact (able to guide new clinician through steps involved to manage BP when getting up and preparing to walk) ?Attention: Selective ?Sustained Attention: Appears intact ?Selective Attention: Appears intact ?Awareness: Appears intact ?Awareness Impairment: Anticipatory impairment ?Problem Solving: Appears intact ?Problem Solving Impairment: Functional complex ?Safety/Judgment: Appears intact ?Sensation ?Sensation ?Light Touch: Appears Intact ?Hot/Cold: Appears Intact ?Proprioception: Appears Intact ?Stereognosis: Not tested ?Coordination ?Gross Motor Movements are Fluid and Coordinated: Yes ?Fine Motor Movements are Fluid and Coordinated: Yes ?Motor  ?Motor ?Motor: Within Functional Limits ?Motor - Discharge Observations: improved from d/c ; limited at times by BP ?Mobility  ?Bed  Mobility ?Supine to Sit: Independent with assistive device ?Sit to Supine: Independent with assistive device ?Transfers ?Sit to Stand: Supervision/Verbal cueing ?Stand to Sit: Supervision/Verbal cueing  ?Trunk/Postural Assessment  ?Cervical Assessment ?Cervical Assessment: Within Functional Limits ?Thoracic Assessment ?Thoracic Assessment: Within Functional Limits ?Lumbar Assessment ?Lumbar Assessment: Within Functional Limits ?Postural Control ?Trunk Control: improving  ?Balance ?  ?Extremity/Trunk Assessment ?RUE Assessment ?RUE Assessment: Within Functional Limits ?LUE Assessment ?LUE Assessment: Within Functional Limits ? ? ?Mariah Milling ?08/04/2021, 12:25 PM ?

## 2021-08-04 NOTE — Progress Notes (Signed)
Patient ID: Lauren Conner, female   DOB: April 09, 1941, 81 y.o.   MRN: [id number] ? ?SW ordered nebulizer machine as requested by medical team.  ? ?SW left message for pt son [name] to inform on transport chair to be delivered to the home and nebulizer machine ordered.  ? ?SW spoke with pt son [name] [phone]) to discuss above. He confirms transport chair received. He mentioned that his mother said she has a nebulizer but unsure on where it is located so they would like another one. SW informed already ordered. He asked about outpatient referral being sent to [place]. SW shared referral was entered in error, and it was sent to [facility]. SW discussed d/c time tomorrow. No other questions/concerns reported.  ? ?W spoke with [facility] requesting for appointments to be cancelled.  ? ?Loralee Pacas, MSW, LCSWA ?Office: [phone] ?Cell: [phone] ?Fax: [phone]  ?

## 2021-08-04 NOTE — Plan of Care (Signed)
?  Problem: RH Problem Solving ?Goal: LTG Patient will demonstrate problem solving for (SLP) ?Description: LTG:  Patient will demonstrate problem solving for basic/complex daily situations with cues  (SLP) ?Outcome: Not Met (add Reason) ?Note: Pt continues to require Mod verbal and visual assistance ?  ?Problem: RH Memory ?Goal: LTG Patient will use memory compensatory aids to (SLP) ?Description: LTG:  Patient will use memory compensatory aids to recall biographical/new, daily complex information with cues (SLP) ?Outcome: Completed/Met ?  ?

## 2021-08-04 NOTE — Progress Notes (Signed)
Occupational Therapy Session Note ? ?Patient Details  ?Name: Lauren Conner ?MRN: [id number] ?Date of Birth: Jun 08, 1940 ? ?Today's Date: 08/04/2021 ?OT Individual Time: 1005-1050 ?OT Individual Time Calculation (min): 45 min  ? ? ?Short Term Goals: ?Week 1:  OT Short Term Goal 1 (Week 1): LTG=STG ? ?Skilled Therapeutic Interventions/Progress Updates: Patient received supine in bed, eager to get up and get washed up.  Patient able to instruct OT (new to patient) in needed steps to manage her blood pressure when getting out of bed.  Patient needed assistance to don thigh high TED hose over feet- patient able to report that thigh highs were preferred.  Patient able to don abdominal binder and close with sufficient tightness.  Patient walked to sink with rollator, and used rollator to sit when washing. Patient easily found all necessary items and completed bathing and grooming with supervision.  Patient able to transition sit to stand multiple times without assistance or cueing using BUE to stabilize on countertop.  Patient stood during ADL for several minutes without difficulty.   ?Patient left sitting up in wheelchair with sensor under seat to alarm with attempts to get up.  Patient states - "I promise not to get up" - eager to get home tomorrow with family.  Patient with phone, glasses and nurse call bell all within reach.   ?   ? ?Therapy Documentation ?Precautions:  ?Precautions ?Precautions: Fall ?Precaution Comments: orthostatic hypotension, don thigh high TED hose and abdominal binder before OOB mobility ?Restrictions ?Weight Bearing Restrictions: No ?  ?Pain: ?Pain Assessment ?Pain Scale: 0-10 ?Pain Score: 0-No pain ? ? ? ? ?Therapy/Group: Individual Therapy ? ?Mariah Milling ?08/04/2021, 12:30 PM ?

## 2021-08-04 NOTE — Consult Note (Signed)
Neuropsychological Consultation ? ? ?Patient:   Lauren Conner  ? ?DOB:   [birth year] ? ?MR Number:  [id number] ? ?Location:  [place] ?[place] [address] [place] ?[id number] [facility] ?[place] [address] ?Dept: [phone] ?Loc: [phone] ?          ?Date of Service:   08/04/2021 ? ?Start Time:   9 AM ?End Time:   10 AM ? ?Provider/Observer:  Ilean Skill, Psy.D.   ?    Clinical Neuropsychologist ?     ? ?Billing Code/Service: [id number] ? ?Chief Complaint:    Lauren Conner is a 81 year old female with history of anemia of chronic disease paroxysmal SVT, hypothyroidism, hypertension, chronic kidney disease stage IV.  Patient presented to [facility] on 07/19/2021 after being found down on her left side with altered mental status and left-sided weakness.  Cranial CT scan showed large area of acute intraparenchymal hemorrhage within the right temporal lobe and mild effacement of the occipital horn and atrium of the right lateral ventricle.  Small amount of subarachnoid hemorrhage noted around the right temporal lobe.  There was a 3 mm right to left midline shift.  Due to ongoing decreased functional mobility f including left-sided weakness patient was admitted for CIR. ? ?Reason for Service:  Patient was referred for neuropsychological evaluation to assess cognitive status and assist in helping her cope and adjust to ongoing issues and extended hospital stay.  Below is the HPI for the current admission. ? ?HPI: Lauren Conner is a 81 year old right-handed female with history of anemia of chronic disease paroxysmal SVT, hypothyroidism, hypertension, CKD stage IV followed by Dr.Lehrich with nephrology at [facility] with creatinine baseline 2.0.  Per chart review patient lives alone.  1 level home 2 steps to enter.  Independent and active prior to admission.  Son and daughter-in-law are planning on moving in with her after discharge to provide assistance as  needed.  Presented to [facility] 07/19/2021 after being found down on her left side with altered mental status and left-sided weakness.  Patient was tachycardic 124 with low-grade fever of 100.5 and blood pressure 154/121, oxygen saturations 100% room air.  Cranial CT scan showed large area of acute intraparenchymal hemorrhage within the right temporal parietal lobe 4.0 x 4.0 x 4.1 cm with surrounding vasogenic edema and mild effacement of the occipital horn and atrium of the right lateral ventricle.  Small amount of subarachnoid hemorrhage within [place] at the anterior right temporal lobe.  3 mm of right to left midline shift.  Admission chemistry sodium 126 chloride 97 glucose 157 BUN 41 creatinine 2.13, WBC 17,100, hemoglobin 10.2, lactic acid 3.5 troponin 55-33, blood cultures no growth to date, urinalysis negative nitrite, procalcitonin 0.32, COVID-19 and influenza testing negative.  CT of the chest without contrast showed significant motion artifact bilaterally however notable cylindrical and saccular bronchiectatic changes with chronic bronchitic airway bilaterally.  CT of chest abdomen pelvis showed bilateral hydronephrosis and proximal hydroureter.  MRI/MRA of the head 25 mL intraparenchymal hematoma centered at the right temporal occipital region.  No vascular abnormality seen underlying the right cerebral hemorrhage.  Echocardiogram with ejection fraction of 65 to 70% no wall motion abnormalities grade 1 diastolic dysfunction.  Patient was placed on broad-spectrum intravenous antibiotic for suspected sepsis.  Urology services Dr. Diona Fanti consulted in regards to urinary retention/significant bilateral hydronephrosis recommended Foley catheter tube and Flomax x2 weeks and follow-up outpatient.  A follow-up renal ultrasound 07/24/2021 revealed  no acute abnormality resolved bilateral hydronephrosis.  Renal services Dr. Moshe Cipro consulted in regards to longstanding CKD stage IV with baseline creatinine 2.0  elevating to 2.78 felt to be related to BP variability going from very high to very low normal.  Amlodipine and metoprolol discontinued and lattest creatinine 2.44.  Her antibiotic regimen has been transition to Levaquin 500 mg every 48 hours 07/23/2021 per PCCM x14 days.  RVP negative, blood cultures negative to date.  Anemia of chronic disease A's hemoglobin 6.9 awaiting plan transfusion.  She is tolerating a regular consistency diet.  Therapy evaluations completed due to patient decreased functional mobility left side weakness was admitted for a comprehensive rehab program. Currently complains of weakness.  ? ?Current Status:  Patient was awake and alert as I entered the room and was very talkative with good expressive and receptive language.  She did have a great deal of verbosity and there was a limited number of paraphasic errors noted but expression language appeared to be quite good.  The patient was euthymic throughout.  She reports that she has been getting regular headaches and had difficulty sleeping last night after waking up with a headache in the very early morning hours.  Patient reports that she has been getting very similar headaches for years now and has taken Tylenol every day for quite some time.  These potentially could be rebound headaches from her daily Tylenol use.  Patient describes these headaches as identical to the ones that she has had in the past and not associating them to her recent TBI.  Patient reports that she has a considerable amount of worry about how much she will experience a reduction in independence following this injury.  Patient talked about her desire for independence and understands the need for assistance when she is discharged but is quite concerned about potential for not being able to attain independence into the future and remain living alone in her home.  These issues were addressed during therapeutic interventions today.  Patient reported that her headache had  subsided as it typically did with Tylenol.  Patient was well-oriented with good mental status and cognition. ? ?Behavioral Observation: Lauren Conner  presents as a 81 y.o.-year-old Right handed Caucasian Female who appeared her stated age. her dress was Appropriate and she was Well Groomed and her manners were Appropriate to the situation.  her participation was indicative of Appropriate and Redirectable behaviors.  There were physical disabilities noted.  she displayed an appropriate level of cooperation and motivation.   ? ? ?Interactions:    Active Appropriate ? ?Attention:   within normal limits and attention span and concentration were age appropriate ? ?Memory:   within normal limits; recent and remote memory intact ? ?Visuo-spatial:  not examined ? ?Speech (Volume):  normal ? ?Speech:   normal; normal ? ?Thought Process:  Coherent and Relevant ? ?Though Content:  WNL; not suicidal and not homicidal ? ?Orientation:   person, place, time/date, and situation ? ?Judgment:   Fair ? ?Planning:   Fair ? ?Affect:    Appropriate ? ?Mood:    Euthymic ? ?Insight:   Good ? ?Intelligence:   normal ? ?Medical History:  History reviewed. No pertinent past medical history. ? ?     ?Patient Active Problem List  ? Diagnosis Date Noted  ? 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 Bronchiectasis with acute exacerbation ([place])   ? Encephalopathy acute 07/21/2021  ? Intracerebral hemorrhage 07/21/2021  ? Acute renal failure ([place])   ?  Acute urinary retention 07/20/2021  ? Bilateral hydronephrosis 07/20/2021  ? Osteoporosis without current pathological fracture 07/20/2021  ? CKD (chronic kidney disease) stage 4, GFR 15-29 ml/min ([facility]) 07/19/2021  ? Paroxysmal SVT (supraventricular tachycardia) ([place]) 07/19/2021  ? Severe sepsis ([place]) 07/19/2021  ? Pneumonia of both lungs due to infectious organism 07/19/2021  ? Acute metabolic encephalopathy 44/97/5300  ? Anemia of chronic kidney failure, stage 4 (severe) ([place])  07/19/2021  ? Metabolic acidosis 51/02/2110  ? Hyponatremia 07/19/2021  ? Essential hypertension 11/17/2018  ? Hypothyroidism (acquired) 12/06/2015  ? Adult bronchiectasis ([place]) 03/25/2011  ? ?  ?Psychiatric History:  No pri

## 2021-08-04 NOTE — Progress Notes (Signed)
Inpatient Rehabilitation Discharge Medication Review by a Pharmacist ? ?A complete drug regimen review was completed for this patient to identify any potential clinically significant medication issues. ? ?High Risk Drug Classes Is patient taking? Indication by Medication  ?Antipsychotic No   ?Anticoagulant No   ?Antibiotic No   ?Opioid No   ?Antiplatelet No   ?Hypoglycemics/insulin No   ?Vasoactive Medication Yes Norvasc and Midodrine for BP   ?Chemotherapy No   ?Other Yes Synthroid for low thyroid ?Protonix for GERD ?Flomax for urinary retention ? ?Sodium bicarb for electrolytes  ? ? ? ?Type of Medication Issue Identified Description of Issue Recommendation(s)  ?Drug Interaction(s) (clinically significant) ?    ?Duplicate Therapy ?    ?Allergy ?    ?No Medication Administration End Date ?    ?Incorrect Dose ?    ?Additional Drug Therapy Needed ?    ?Significant med changes from prior encounter (inform family/care partners about these prior to discharge).    ?Other ?    ? ? ?Clinically significant medication issues were identified that warrant physician communication and completion of prescribed/recommended actions by midnight of the next day:  No ? ?Pharmacist comments: None ? ?Time spent performing this drug regimen review (minutes):  20 minutes ? ? ?Lauren Conner ?08/04/2021 9:19 AM ?

## 2021-08-04 NOTE — Progress Notes (Signed)
Physical Therapy Note ? ?Patient Details  ?Name: Lauren Conner ?MRN: [id number] ?Date of Birth: 07-29-1940 ?Today's Date: 08/04/2021 ? ? ? ?Patient in bed with eyes closed and lights off upon PT arrival. Patient reports 5/10 headache pain and limited sleep due to "severe" headache "all night" while waiting for 7am Tylenol. Discussed patient's concerns with RN. Patient declined any skilled interventions at this time due to continued headache and lack of sleep. Patient agreeable to PT returning at 12:30pm to make up missed session from this morning. Schedule updated.   ? ?Doreene Burke PT, DPT ? ?08/04/2021, 8:33 AM  ?

## 2021-08-05 DIAGNOSIS — D638 Anemia in other chronic diseases classified elsewhere: Secondary | ICD-10-CM

## 2021-08-05 DIAGNOSIS — I951 Orthostatic hypotension: Secondary | ICD-10-CM

## 2021-08-05 NOTE — Progress Notes (Signed)
?                                                       PROGRESS NOTE ? ? ?Subjective/Complaints: ? ?H/a much better with topamax. Has a small pain above right eye only. In good spirits and excited to be going home today. Son in room ? ?ROS: Patient denies fever, rash, sore throat, blurred vision, dizziness, nausea, vomiting, diarrhea, cough, shortness of breath or chest pain, joint or back/neck pain, headache, or mood change.  ? ?Objective: ?  ?No results found. ?Recent Labs  ?  08/04/21 ?0624  ?WBC 5.6  ?HGB 10.5*  ?HCT 32.1*  ?PLT 235  ? ? ?Recent Labs  ?  08/04/21 ?0624  ?NA 137  ?K 4.1  ?CL 107  ?CO2 21*  ?GLUCOSE 81  ?BUN 34*  ?CREATININE 2.62*  ?CALCIUM 8.1*  ? ? ? ?Intake/Output Summary (Last 24 hours) at 08/05/2021 0915 ?Last data filed at 08/04/2021 2148 ?Gross per 24 hour  ?Intake 200 ml  ?Output 500 ml  ?Net -300 ml  ?  ? ?  ? ?Physical Exam: ?Vital Signs ?Blood pressure (!) 156/87, pulse 90, temperature 97.7 ?F (36.5 ?C), resp. rate 16, height 5\' 3"  (1.6 m), weight 57.6 kg, SpO2 96 %. ? ? ? ?Constitutional: No distress . Vital signs reviewed. ?HEENT: NCAT, EOMI, oral membranes moist ?Neck: supple ?Cardiovascular: RRR without murmur. No JVD    ?Respiratory/Chest: CTA Bilaterally without wheezes or rales. Normal effort    ?GI/Abdomen: BS +, non-tender, non-distended ?Ext: no clubbing, cyanosis, or edema ?Psych: pleasant and cooperative  ?Skin: No evidence of breakdown, no evidence of rash ?Neurologic:   Alert and oriented x 3. Reasonable insight and awareness. Intact Memory. Normal language and speech. Cranial nerve exam unremarkable motor strength is 5/5 in bilateral deltoid, bicep, tricep, grip, hip flexor, knee extensors, ankle dorsiflexor and plantar flexor. Sensory exam normal for light touch and pain in all 4 limbs. No limb ataxia or cerebellar signs. No abnormal tone appreciated.   ?Musculoskeletal: Full range of motion in all 4 extremities. No joint swelling ? ? ?Assessment/Plan: ?1. Functional  deficits which require 3+ hours per day of interdisciplinary therapy in a comprehensive inpatient rehab setting. ?Physiatrist is providing close team supervision and 24 hour management of active medical problems listed below. ?Physiatrist and rehab team continue to assess barriers to discharge/monitor patient progress toward functional and medical goals ? ?Care Tool: ? ?Bathing ?   ?Body parts bathed by patient: Right arm, Left arm, Chest, Abdomen, Front perineal area, Buttocks, Left upper leg, Right upper leg, Right lower leg, Left lower leg, Face  ?   ?  ?  ?Bathing assist Assist Level: Supervision/Verbal cueing ?  ?  ?Upper Body Dressing/Undressing ?Upper body dressing   ?What is the patient wearing?: Bra, Pull over shirt ?   ?Upper body assist Assist Level: Supervision/Verbal cueing ?   ?Lower Body Dressing/Undressing ?Lower body dressing ? ? ?   ?What is the patient wearing?: Underwear/pull up, Pants ? ?  ? ?Lower body assist Assist for lower body dressing: Supervision/Verbal cueing ?   ? ?Toileting ?Toileting Toileting Activity did not occur (Probation officer and hygiene only): N/A (no void or bm)  ?Toileting assist Assist for toileting: Supervision/Verbal cueing ?  ?  ?Transfers ?Chair/bed transfer ? ?Transfers assist ?   ? ?  Chair/bed transfer assist level: Independent with assistive device ?Chair/bed transfer assistive device: Other (Rollator) ?  ?Locomotion ?Ambulation ? ? ?Ambulation assist ? ?   ? ?Assist level: Supervision/Verbal cueing ?Assistive device: Rollator ?Max distance: 300'  ? ?Walk 10 feet activity ? ? ?Assist ?   ? ?Assist level: Supervision/Verbal cueing ?Assistive device: Rollator  ? ?Walk 50 feet activity ? ? ?Assist   ? ?Assist level: Supervision/Verbal cueing ?Assistive device: Rollator  ? ? ?Walk 150 feet activity ? ? ?Assist Walk 150 feet activity did not occur: Safety/medical concerns (decreased activity tolerance) ? ?Assist level: Supervision/Verbal cueing ?Assistive device:  Rollator ?  ? ?Walk 10 feet on uneven surface  ?activity ? ? ?Assist   ? ? ?Assist level: Contact Guard/Touching assist ?Assistive device: Rollator  ? ?Wheelchair ? ? ? ? ?Assist Is the patient using a wheelchair?: No ?Type of Wheelchair: Manual ?  ? ?Wheelchair assist level: Minimal Assistance - Patient > 75%, Supervision/Verbal cueing ?Max wheelchair distance: >150 ft  ? ? ?Wheelchair 50 feet with 2 turns activity ? ? ? ?Assist ? ?  ?  ? ? ?Assist Level: Supervision/Verbal cueing  ? ?Wheelchair 150 feet activity  ? ? ? ?Assist ?   ? ? ?Assist Level: Minimal Assistance - Patient > 75%  ? ?Blood pressure (!) 156/87, pulse 90, temperature 97.7 ?F (36.5 ?C), resp. rate 16, height 5\' 3"  (1.6 m), weight 57.6 kg, SpO2 96 %. ? ?Medical Problem List and Plan: ?1. 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 ?            dc home today ? -f[facility] with [facility], neuro, urology, nephrology, primary ?          ? ?2.  Antithrombotics: ?-DVT/anticoagulation:  Mechanical: Antiembolism stockings, thigh (TED hose) Bilateral lower extremities ?            -antiplatelet therapy: N/A ?3. Pain Management/headaches: Tylenol scheduled tid--no longer covering pain ? 3/21 improved with topamax. Continue 25mg  qhs ?4.   CAP with baseline bronchiectasis.   .   ?-Completed 14-day course Levaquin initiated 07/23/2021.   ?-Continue inhalers as directed ?9.  Acute urinary retention with significant bilateral hydronephrosis.  Follow-up 07/23/2021 Dr. Diona Fanti.  Recommend CONTINUE FOLEY CATHETER TUBE  and Flomax for at least 2 weeks and follow-up outpatient.   ?-Follow-up renal ultrasound 07/24/2021 showed resolved bilateral hydronephrosis ?   ?10.  AKI on CKD stage IV.  Patient followed by Dr.Lehrich at [facility] with baseline creatinine 2.0.  Currently seen by Dr. Moshe Cipro while at [facility].  Currently with worsening of renal function suspect related to BP  variability with amlodipine and metoprolol discontinued.    ? -  Cr in range of 2.4-2.6 ? -f/u with nephrology as outpt as well. ?11.  Hypertension.    Lopressor currently discontinued.   ? - -acclimation, TEDS ? -wearing binder as tight as possible/tolerable ? -changed timing of norvasc to bedtime to help with am htn ? 3/20 -continue low dose trial of midodrine in am and lunchtime ?         -free T4 on 0.9, adjusted synthroid to 119mcg daily ?        -orthostasis seems improved ?        -tolerate sl increased resting bp ?12.  Hypothyroidism: continue synthroid 151mcg daily ?13.  History of cardiac dysfunction with SVT.  Cardiac rate controlled. Monitor HR TID.  ?14.  Anemia of chronic disease.  Transfused 2 units 07/25/2021 patient had been receiving Aranesp every 2 weeks ?   10.5 3/20 ? -no gross bleeding, appears stable ?15. Slow transit constipation: ? --continue senna and miralax daily ?  -3/19- LBM   ? ?LOS: ?10 days ?A FACE TO FACE EVALUATION WAS PERFORMED ? ?Meredith Staggers ?08/05/2021, 9:15 AM  ? ? ? ?

## 2021-08-05 NOTE — Progress Notes (Signed)
Pt discharged with belongings. Pt and son educated and demonstrated on how to drain foley bag and change to leg bag.  ?

## 2021-08-06 ENCOUNTER — Encounter: Payer: Self-pay | Admitting: Physical Therapy

## 2021-08-06 ENCOUNTER — Ambulatory Visit: Payer: Medicare Other | Admitting: Occupational Therapy

## 2021-08-06 ENCOUNTER — Ambulatory Visit: Payer: Medicare Other | Admitting: Speech Pathology

## 2021-08-06 ENCOUNTER — Other Ambulatory Visit: Payer: Self-pay

## 2021-08-06 ENCOUNTER — Ambulatory Visit: Payer: Medicare Other | Attending: Physician Assistant | Admitting: Physical Therapy

## 2021-08-06 DIAGNOSIS — I619 Nontraumatic intracerebral hemorrhage, unspecified: Secondary | ICD-10-CM | POA: Diagnosis not present

## 2021-08-06 DIAGNOSIS — I69318 Other symptoms and signs involving cognitive functions following cerebral infarction: Secondary | ICD-10-CM | POA: Diagnosis present

## 2021-08-06 DIAGNOSIS — R2681 Unsteadiness on feet: Secondary | ICD-10-CM | POA: Insufficient documentation

## 2021-08-06 DIAGNOSIS — H53462 Homonymous bilateral field defects, left side: Secondary | ICD-10-CM | POA: Insufficient documentation

## 2021-08-06 DIAGNOSIS — R2689 Other abnormalities of gait and mobility: Secondary | ICD-10-CM | POA: Diagnosis present

## 2021-08-06 DIAGNOSIS — M6281 Muscle weakness (generalized): Secondary | ICD-10-CM | POA: Insufficient documentation

## 2021-08-06 DIAGNOSIS — R41841 Cognitive communication deficit: Secondary | ICD-10-CM | POA: Diagnosis present

## 2021-08-06 NOTE — Therapy (Signed)
?OUTPATIENT OCCUPATIONAL THERAPY NEURO EVALUATION ? ?Patient Name: Lauren Conner ?MRN: [id number] ?DOB:03-19-1941, 81 y.o., female ?Today's Date: 08/06/2021 ? ?PCP: [facility] ?REFERRING PROVIDER: Cathlyn Parsons, PA-C ? ? OT End of Session - 08/06/21 1457   ? ? Visit Number 1   ? Number of Visits 17   ? Date for OT Re-Evaluation 10/01/21   ? Authorization Type MCR, [employer]   ? OT Start Time 1020   ? OT Stop Time 1105   ? OT Time Calculation (min) 45 min   ? Activity Tolerance Patient tolerated treatment well   ? Behavior During Therapy [facility] for tasks assessed/performed   ? ?  ?  ? ?  ? ? ?No past medical history on file. ?No past surgical history on file. ?Patient Active Problem List  ? Diagnosis Date Noted  ? 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 Bronchiectasis with acute exacerbation ([place])   ? Encephalopathy acute 07/21/2021  ? Intracerebral hemorrhage 07/21/2021  ? Acute renal failure ([place])   ? Acute urinary retention 07/20/2021  ? Bilateral hydronephrosis 07/20/2021  ? Osteoporosis without current pathological fracture 07/20/2021  ? CKD (chronic kidney disease) stage 4, GFR 15-29 ml/min (HCC) 07/19/2021  ? Paroxysmal SVT (supraventricular tachycardia) ([place]) 07/19/2021  ? Severe sepsis ([place]) 07/19/2021  ? Pneumonia of both lungs due to infectious organism 07/19/2021  ? Acute metabolic encephalopathy 24/58/0998  ? Anemia of chronic kidney failure, stage 4 (severe) ([place]) 07/19/2021  ? Metabolic acidosis 33/82/5053  ? Hyponatremia 07/19/2021  ? Essential hypertension 11/17/2018  ? Hypothyroidism (acquired) 12/06/2015  ? Adult bronchiectasis ([place]) 03/25/2011  ? ? ?ONSET DATE: 07/19/21 ? ?REFERRING DIAG: I61.9 (ICD-10-CM) - Intraparenchymal hemorrhage of brain ([place])  ? ?THERAPY DIAG:  ?Left homonymous hemianopsia - Plan: Ot plan of care cert/re-cert ? ?Unsteadiness on feet - Plan: Ot plan of care cert/re-cert ? ?Muscle weakness (generalized) - Plan: Ot plan of care cert/re-cert ? ?Other  symptoms and signs involving cognitive functions following cerebral infarction - Plan: Ot plan of care cert/re-cert ? ?SUBJECTIVE:  ? ?SUBJECTIVE STATEMENT: ?No pain ?Pt accompanied by: family member ? ?PERTINENT HISTORY: Lauren Conner is a 81 y.o. right-handed female with history of anemia of chronic disease, paroxysmal SVT hypothyroidism hypertension CKD stage IV followed by Dr.Lehrich with nephrology at [facility] with creatinine baseline 2.0.  ? ?PRECAUTIONS: Fall and Other: no driving , abdominal binder d/t orthostatic hypotension, foley catheter, and Lt visual field cut ? ?WEIGHT BEARING RESTRICTIONS No ? ?PAIN:  ?Are you having pain? No ? ?FALLS: Has patient fallen in last 6 months? Yes, Number of falls: 1 ? ?LIVING ENVIRONMENT: ?Lives with: lives alone and but son and wife will stay w/ patient until better ?Lives in: House/apartment ?Stairs: Yes; External: 2 steps; none ?Has following equipment at home:  rollator, transport chair ? ?PLOF: Independent ? ?PATIENT GOALS to return to PLOF ? ?OBJECTIVE:  ? ?HAND DOMINANCE: Right ? ?ADLs: ?Transfers/ambulation related to ADLs: ?Eating: IND ?Grooming: IND ?UB Dressing: Min assist w/ hooking bra, wears slip on shirts ?LB Dressing: min assist occasionally for compression stockings, slip on shoes/pants ?Toileting: Mod I for BM- assist for foley catheter ?Bathing: supervision, built in shower seat ?Tub Shower transfers: close sup, gait belt, walk in shower ?Equipment: Walk in shower and built in shower seat ? ? ?IADLs: ?Shopping: depeendent ?Light housekeeping: dependent ?Meal Prep: dependent ?Community mobility: relies on family for transportation ?Medication management: pill box, reminders ?Financial management: dependent ?Handwriting: 90% legible and  d/t expressive aphasia ? ?MOBILITY STATUS: Needs Assist: rollator ? ? ?UE ROM   BUE AROM WFL's ? ?Active ROM Right ?08/06/2021 Left ?08/06/2021  ?Shoulder flexion    ?Shoulder abduction    ?Shoulder adduction     ?Shoulder extension    ?Shoulder internal rotation    ?Shoulder external rotation    ?Elbow flexion    ?Elbow extension    ?Wrist flexion    ?Wrist extension    ?Wrist ulnar deviation    ?Wrist radial deviation    ?Wrist pronation    ?Wrist supination    ?(Blank rows = not tested) ? ? ?UE MMT:   RUE MMT grossly 5/5, LUE MMT grossly 4+/5 ? ?MMT Right ?08/06/2021 Left ?08/06/2021  ?Shoulder flexion    ?Shoulder abduction    ?Shoulder adduction    ?Shoulder extension    ?Shoulder internal rotation    ?Shoulder external rotation    ?Middle trapezius    ?Lower trapezius    ?Elbow flexion    ?Elbow extension    ?Wrist flexion    ?Wrist extension    ?Wrist ulnar deviation    ?Wrist radial deviation    ?Wrist pronation    ?Wrist supination    ?(Blank rows = not tested) ? ?HAND FUNCTION: ?Grip strength: Right: 33 lbs; Left: 27.5 lbs ? ?COORDINATION: ?9 Hole Peg test: Right: 33.09 sec; Left: 33.90 sec ? ?SENSATION: ?Slightly diminshied - not formally assessed ? ?EDEMA: none ? ? ?COGNITION: ?Overall cognitive status: Impaired: noted word finding difficulties and difficulty forming letters, anxious , decreased processing, difficulty following commands, talkative/tangential ? ?VISION: ?Subjective report: I can't see to my left ?Baseline vision: Wears glasses for reading only and bluriness Lt eye ? ? ?VISION ASSESSMENT: ?Visual Fields: Left visual field deficits ? ? ? ?PERCEPTION: Not tested, possible inattention to Lt side as well as field cut ? ?PRAXIS: Not tested ? ?OBSERVATIONS: abdominal binder, expressive aphasia, Lt field cut ? ? ? ? ? ? ?GOALS: ?Goals reviewed with patient? Yes ? ?SHORT TERM GOALS: Target date: 09/03/2021 ? ?Pt will be independent with HEP targeting overall endurance ? ?Baseline: n/a ?Goal status: INITIAL ? ?2.  Pt to verbalize understanding with visual scanning strategies ?Baseline: dep ?Goal status: INITIAL ? ?3.  Pt will perform basic snack prep w/ supervision  ?Baseline: dep ?Goal status: INITIAL ? ?4.   Pt to perform tabletop scanning attending to Lt side 75% or greater ?Baseline: dep ?Goal status: INITIAL ? ?5.  Pt to verbalize understanding with memory compensatory strategies ?Baseline: dep ?Goal status: INITIAL ? ? ? ?LONG TERM GOALS: Target date: 10/01/2021 ? ?Pt will perform simple stovetop cooking task and light housekeeping at mod I and good safety awareness ?Baseline: dep ?Goal status: INITIAL ? ?2.  Pt will perform environmental scanning with 85% accuracy in a min distracting environment.   ?Baseline: dep ?Goal status: INITIAL ? ?3.  Pt to perform BADLS I'ly  ?Baseline: min assist for shower transfers and dressing ?Goal status: INITIAL ? ?4.  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 ?5.  Pt to perform dynamic standing tasks safely w/o LOB ?Baseline: unsteadiness ?Goal status: INITIAL ? ? ?ASSESSMENT: ? ?CLINICAL IMPRESSION: ?Lauren Conner is a 81 y.o. right-handed female with history of anemia of chronic disease, paroxysmal SVT hypothyroidism hypertension CKD stage IV followed by Dr.Lehrich with nephrology at [facility] with creatinine baseline 2.0. Presented to [facility]19/2021  after being found down on her left side with altered mental status and left-sided weakness. Pt w/ Intraparenchymal hemorrhage of brain, foley catheter, abdominal binder and compression stockings. Pt presents today with significant Lt visual field cut, decreased cognition and aphasia, decreased balance, and ability to perform IADLS. Pt would benefit from skilled O.T. to address these deficits.  ? ?PERFORMANCE DEFICITS in functional skills including ADLs, IADLs, sensation, strength, FMC, mobility, balance, endurance, decreased knowledge of precautions, decreased knowledge of use of DME, vision, and UE functional use, cognitive skills including attention, memory, perception, problem solving, safety awareness, and  sequencing,  ? ?IMPAIRMENTS are limiting patient from ADLs, IADLs, and social participation.  ? ?COMORBIDITIES may have co-morbidities  that affects occupational performance. Patient will benefit from skilled

## 2021-08-06 NOTE — Therapy (Signed)
?OUTPATIENT PHYSICAL THERAPY NEURO EVALUATION ? ? ?Patient Name: Lauren Conner ?MRN: [id number] ?DOB:Nov 15, 1940, 81 y.o., female ?Today's Date: 08/06/2021 ? ?PCP: [facility] ?REFERRING PROVIDER: Cathlyn Parsons, PA-C ? ? PT End of Session - 08/06/21 1107   ? ? Visit Number 1   ? Number of Visits 17   Plus eval  ? Date for PT Re-Evaluation 10/29/21   ? Authorization Type Medicare A & B   ? Progress Note Due on Visit 10   ? PT Start Time 1105   ? PT Stop Time 1145   ? PT Time Calculation (min) 40 min   ? Equipment Utilized During Treatment Gait belt;Other (comment)   Ted hose, abdominal binder  ? Activity Tolerance Patient tolerated treatment well   ? Behavior During Therapy [facility] for tasks assessed/performed   ? ?  ?  ? ?  ? ? ?History reviewed. No pertinent past medical history. ?History reviewed. No pertinent surgical history. ?Patient Active Problem List  ? Diagnosis Date Noted  ? Intraparenchymal hemorrhage of brain ([place]) 07/26/2021  ? Bronchiectasis with acute exacerbation ([place])   ? Encephalopathy acute 07/21/2021  ? Intracerebral hemorrhage 07/21/2021  ? Acute renal failure ([place])   ? Acute urinary retention 07/20/2021  ? Bilateral hydronephrosis 07/20/2021  ? Osteoporosis without current pathological fracture 07/20/2021  ? CKD (chronic kidney disease) stage 4, GFR 15-29 ml/min (HCC) 07/19/2021  ? Paroxysmal SVT (supraventricular tachycardia) ([place]) 07/19/2021  ? Severe sepsis ([place]) 07/19/2021  ? Pneumonia of both lungs due to infectious organism 07/19/2021  ? Acute metabolic encephalopathy 79/06/4095  ? Anemia of chronic kidney failure, stage 4 (severe) ([place]) 07/19/2021  ? Metabolic acidosis 35/32/9924  ? Hyponatremia 07/19/2021  ? Essential hypertension 11/17/2018  ? Hypothyroidism (acquired) 12/06/2015  ? Adult bronchiectasis ([place]) 03/25/2011  ? ? ?ONSET DATE: 08/04/2021 (referral) ? ?REFERRING DIAG: I61.9 (ICD-10-CM) - Intraparenchymal hemorrhage of brain ([place])  ? ?THERAPY DIAG:  ?Muscle weakness  (generalized) ? ?Other abnormalities of gait and mobility ? ?SUBJECTIVE:  ?                                                                                                                                                                                           ? ?SUBJECTIVE STATEMENT: ?"I just want to live by myself again". Pt reports she was able to leave CIR early she is an Nature conservation officer bunny" and determined to regain independence. Pt discharged from CIR yesterday and had no issues to report at home.  ? ?Pt accompanied by:  son ? ?PERTINENT HISTORY: anemia of chronic disease, paroxysmal SVT hypothyroidism hypertension CKD stage IV  ? ?PAIN:  ?  Are you having pain? No Had headache earlier, but took Tylenol earlier and is dissipated  ? ?PRECAUTIONS: Fall and Other: orthostatic ? ?WEIGHT BEARING RESTRICTIONS No ? ?FALLS: Has patient fallen in last 6 months? Yes, Number of falls: <5 due to orthostasis  ? ?LIVING ENVIRONMENT: ?Lives with: lives alone and son and her daughter in law living with her currently until pt regains independence ?Lives in: House/apartment ?Stairs: Yes: External: 2 in front 2 in back steps; none ?Has following equipment at home: Gilford Rile - 2 wheeled, Environmental consultant - 4 wheeled, and transport chair ? ?PLOF: Independent ? ?PATIENT GOALS "To be able to get rid of this chair (rollator) and start walking more"  ? ?OBJECTIVE:  ? ?DIAGNOSTIC FINDINGS: Cranial CT scan showed large area of acute intraparenchymal hemorrhage within the right temporoparietal lobe 4.0 x 4.0 x 4.1 cm with surrounding vasogenic edema with mild effacement of the occipital horn and atrium of the right lateral ventricle.  Small amount of subarachnoid hemorrhage within [place] at the anterior right temporal lobe.  3 mm right to left midline shift.  ? ?VITALS: taken in LUE while seated ?-w/pt speaking: BP 100/65 mmHg, HR 125 bpm ?-Taken without speaking: BP 110/64 mmHg, HR 110 bpm   ? ?COGNITION: ?Overall cognitive status: Impaired: Memory:  Deficits impaired STM ?Awareness: Deficits impaired safety awareness ?Problem solving: Deficits poor executive reasoning  ?Behavior: Restless and Impulsive ?  ?SENSATION: ?WFL ? ?COORDINATION: ?Dysdiadochokinesia bilaterally BUEs ?Heel to shin test WNL ? ? ?POSTURE: rounded shoulders, forward head, decreased lumbar lordosis, increased thoracic kyphosis, and posterior pelvic tilt ? ?MMT:   ? ?MMT Right ?08/06/2021 Left ?08/06/2021  ?Hip flexion 4+ 4  ?Hip extension    ?Hip abduction 5 5  ?Hip adduction 5 5  ?Hip internal rotation    ?Hip external rotation    ?Knee flexion 4+ 4+  ?Knee extension 4+ 4+  ?Ankle dorsiflexion 4 4  ?Ankle plantarflexion 4 4  ?Ankle inversion    ?Ankle eversion    ?(Blank rows = not tested) ? ? ?TRANSFERS: ?Assistive device utilized: Environmental consultant - 4 wheeled  ?Sit to stand: CGA min verbal cues for proper hand placement and locking of rollator  ?Stand to sit: CGA ?Chair to chair: CGA ? ? ?GAIT: ?Gait pattern: step through pattern, decreased stride length, trunk flexed, and narrow BOS ?Distance walked: Various clinic distances  ?Assistive device utilized: Environmental consultant - 4 wheeled ?Level of assistance: CGA ?Comments: Provided directional cues and verbal cues to scan L visual field  ? ?FUNCTIONAL TESTs:  ?Timed up and go (TUG): 22.17s w/rollator and CGA ?Gait velocity: 18.44s w/rollator = 1.77 ft/s (limited community ambulator, recurrent fall risk) ? ?TODAY'S TREATMENT:  ?See clinical impression statement  ? ? ?PATIENT EDUCATION: ?Education details: POC, goals, outcomes assessment  ?Person educated: Patient and Child(ren) ?Education method: Explanation, Demonstration, and Verbal cues ?Education comprehension: verbalized understanding and needs further education ? ? ?HOME EXERCISE PROGRAM: ?To be established next session  ? ? ? ?GOALS: ?Goals reviewed with patient? Yes ? ?SHORT TERM GOALS: Target date: 09/03/2021 ? ?Pt will perform initial HEP with S* for improved strength, balance, transfers and  gait. ? ?Baseline:  ?Goal status: INITIAL ? ?2.  Pt will improve gait velocity to at least 2.3 ft/s with LRAD and S* for improved gait efficiency and functional mobility ?Baseline: 1.77 ft/s with rollator w/CGA ?Goal status: INITIAL ? ?3.  Goal to be written and LTG updated when berg performed  ?Baseline:  ?Goal status: INITIAL ? ?4.  Goal  to be written and LTG updated when 30s STS assessed ?Baseline:  ?Goal status: INITIAL ? ? ?LONG TERM GOALS: Target date: 10/01/2021 ? ?Pt will ascend/descend 4 steps using step-to pattern and LRAD mod I to imitate home environment  ?Baseline:  ?Goal status: INITIAL ? ?2.  Pt will improve normal TUG to less than or equal to 15 seconds w/LRAD mod I for improved functional mobility and decreased fall risk. ? ?Baseline: 22.17s w/rollator and CGA ?Goal status: INITIAL ? ?3.  Pt will improve gait velocity to at least 2.7 ft/s with LRAD mod I for improved gait efficiency and performance at community ambulator level  ? ?Baseline: 1.77 ft/s with rollator and CGA ?Goal status: INITIAL ? ?4.  Write goal when [place] assessed  ?Baseline:  ?Goal status: INITIAL ? ? ?ASSESSMENT: ? ?CLINICAL IMPRESSION: ?Patient is a 81 year old female referred to Neuro OPPT for Intraparenchymal hemorrhage of brain. Pt's PMH is significant for: anemia of chronic disease, paroxysmal SVT hypothyroidism hypertension CKD stage IV. The following deficits were present during the exam: impaired safety awareness, poor executive reasoning, decreased balance, decreased endurance, BLE weakness, L homonomous hemianopsia. Pt hyperverbal throughout evaluation and required frequent redirection cues to sustain attention to task. Based on gait velocity and normal TUG, pt is an incr risk for falls. Pt would benefit from skilled PT to address these impairments and functional limitations to maximize functional mobility independence.  ? ? ?OBJECTIVE IMPAIRMENTS Abnormal gait, cardiopulmonary status limiting activity, decreased activity  tolerance, decreased balance, decreased cognition, decreased coordination, decreased endurance, decreased knowledge of condition, difficulty walking, decreased strength, decreased safety awareness, impaired perceived

## 2021-08-06 NOTE — Therapy (Signed)
?OUTPATIENT SPEECH LANGUAGE PATHOLOGY EVALUATION ? ? ?Patient Name: Lauren Conner ?MRN: [id number] ?DOB:[birth year], 81 y.o., female ?Today's Date: 08/06/2021 ? ?PCP: [facility] ?REFERRING PROVIDER: Cathlyn Parsons, PA-C ? ? End of Session - 08/06/21 1036   ? ? Visit Number 1   ? Number of Visits 17   ? Date for SLP Re-Evaluation 10/29/21   ? Authorization Type Medicare   ? Progress Note Due on Visit 10   ? SLP Start Time 1145   ? SLP Stop Time  1230   ? SLP Time Calculation (min) 45 min   ? Activity Tolerance Patient tolerated treatment well   ? ?  ?  ? ?  ? ? ?No past medical history on file. ?No past surgical history on file. ?Patient Active Problem List  ? Diagnosis Date Noted  ? 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 Bronchiectasis with acute exacerbation ([place])   ? Encephalopathy acute 07/21/2021  ? Intracerebral hemorrhage 07/21/2021  ? Acute renal failure ([place])   ? Acute urinary retention 07/20/2021  ? Bilateral hydronephrosis 07/20/2021  ? Osteoporosis without current pathological fracture 07/20/2021  ? CKD (chronic kidney disease) stage 4, GFR 15-29 ml/min ([facility]) 07/19/2021  ? Paroxysmal SVT (supraventricular tachycardia) ([place]) 07/19/2021  ? Severe sepsis ([place]) 07/19/2021  ? Pneumonia of both lungs due to infectious organism 07/19/2021  ? Acute metabolic encephalopathy 86/76/1950  ? Anemia of chronic kidney failure, stage 4 (severe) ([place]) 07/19/2021  ? Metabolic acidosis 93/26/7124  ? Hyponatremia 07/19/2021  ? Essential hypertension 11/17/2018  ? Hypothyroidism (acquired) 12/06/2015  ? Adult bronchiectasis ([place]) 03/25/2011  ? ? ?ONSET DATE: 07/19/2020  ? ?REFERRING DIAG: I61.9 (ICD-10-CM) - Intraparenchymal hemorrhage of brain ([place])  ? ?THERAPY DIAG:  ?Cognitive communication deficit ? ?SUBJECTIVE:  ? ?SUBJECTIVE STATEMENT: ?"I think it's been going good, I slept good last night." ?Pt accompanied by: family member (son- [place]) ? ?PERTINENT HISTORY: 58 yo F admitted to hospital  on 07/20/21 and d/c 08/05/2021. While admitted, received ST services, focused on cognition. Medical hx significant for end-stage renal failure with CKD, cardiac dysfunction with arrhythmia and SVT, hypothyroidism, moderate to severe protein calorie malnutrition, chronic bronchiectasis.  ? ?PAIN:  ?Are you having pain? No ? ? ?FALLS: Has patient fallen in last 6 months?  No ? ?LIVING ENVIRONMENT: ?Lives with: lives alone; son and daughter in law staying with pt until better ?Lives in: House/apartment ? ?PLOF:  ?Level of assistance: Independent with ADLs, Independent with IADLs ?Employment: Retired ? ? ?PATIENT GOALS "to be back to PLOF. I was a very indpendent woman before the stroke." ? ?OBJECTIVE:  ? ?DIAGNOSTIC FINDINGS: Chest CT with cylindrical and saccular bronchiectatic changes with chronic bronchitic airways bilaterally with mucoid impaction and bibasilar fibrotic changes and large hiatal hernia. MRI 25 mL intraparenchymal hematoma centered at the right temporoccipital region. associated small volume subarachnoid hemorrhage within the adjacent right cerebral hemisphere, underlying age-related cerebral atrophy with moderately advanced chronic microvascular ischemic disease, small remote left cerebellar infarct.  ? ?COGNITION: ?Overall cognitive status: Impaired ?Attention: Impaired: Focused, Sustained, Selective, Alternating, Divided ?Memory: Impaired: Immediate ?Working ?Short term ?Procedural ?Prospective ?Awareness: Impaired: Emergent and Anticipatory ?Executive function: Impaired: Problem solving, Organization, Planning, Error awareness, Self-correction, and Slow processing ?Behavior: Poor frustration tolerance ?Functional deficits: Prior to hemmoragic event, pt was fully indpendent in IADLs and ADLs. Currently requiring care for IADLs and ADLs to include medication + financial management, cooking, household chores, and basic tasks like using cell phone or TV remote.  ? ?  COGNITIVE COMMUNICATION ?Following  directions: Follows one step commands consistently  ?Auditory comprehension: WFL ?Verbal expression: WFL ?Functional communication: WFL ? ?ORAL MOTOR EXAMINATION ?Facial : WFL ?Lingual: WFL ?Velum: WFL ?Mandible: WFL ?Cough: WFL ?Voice: WFL ? ?STANDARDIZED ASSESSMENTS: ?CLQT initiated this date. Complete subtests of orientation (8/8), symbol cancellation (0/12), and story retelling (4/10). Pt demonstrates difficulty with attention, error awareness in symobol cancellation task, visual discrimination, and immediate and delayed recall. Pt with poor frustration tolerance, becoming visually upset with herself when she cannot recall details from story recall.  ? ? PATIENT REPORTED OUTCOME MEASURES (PROM): ?To be completed next session ? ? ?TODAY'S TREATMENT:  ?08-06-21: Use of interviewing to gain insight into pt's goals, prior level of function, and present challenges. Initiated CLQT+ this date, will complete in subsequent session. Provide education re: POC, goals, evaluation results, ST role in rehabilitation.  ? ? ?PATIENT EDUCATION: ?Education details: see above ?Person educated: Patient and Child(ren) ?Education method: Explanation, Demonstration, and Handouts ?Education comprehension: verbalized understanding, returned demonstration, and needs further education ? ? ? ? ?GOALS: ?Goals reviewed with patient? Yes ? ?SHORT TERM GOALS: Target date: 09/03/2021 ? ?Pt will complete CLQT and PROM within first 2 sessions. ?Baseline: initiated ?Goal status: INITIAL ? ?2.  Pt will accurately sort medications into weekly pill box with occasional min A over 2 sessions.  ?Baseline: medications being managed by care partner ?Goal status: INITIAL ? ?3.  Pt will report successful implementation at home of x2 attention/memory compensations with occasional mod A over 2 sessions.  ?Baseline: reports difficulty focusing; impaired memory observed in session ?Goal status: INITIAL ? ?4.  Pt will demonstrate adeuqate organizational skills to  complete mod complex therapy tasks with usual mod A over 2 sessions  ?Baseline: attention, ex fx skills inhibiting successful completion of tasks ?Goal status: INITIAL ? ?5.  Pt will report increased household participation in x2 desired activities (shopping, meal prep, medication/financial management) with supervision from family members   ?Baseline: requires total A for ADLs and IADLS ?Goal status: INITIAL ? ? ?LONG TERM GOALS: Target date: 10/29/2021 ? ?Pt will successfully manage household activities using external aids (to-do list, schedule, etc) with rare min A over 1 weeks  ?Baseline: total A for all ADLs ?Goal status: INITIAL ? ?2.  Pt will successfully implement a bill paying system with rare min A over 1 week period to promote independence with managing finances ?Baseline: requires total A ?Goal status: INITIAL ? ?3.  Pt will demonstrate adeuqate organizational skills to complete mod complex therapy tasks with modified independence (compensations) in 2 sessions  ?Baseline: attention, ex fx skills inhibiting successful completion of tasks ?Goal status: INITIAL ? ? ?ASSESSMENT: ? ?CLINICAL IMPRESSION: ?Patient is a 81 y.o. F who was seen today for cognitive communication deficits s/p right temporoparietal IPH. Pt presents with impairments in domains of executive function, attention, memory, and problem solving. Pt reports difficulty with simple every day tasks such as using remote or cell phone. Demonstrates intellectual and emergent awareness which causes frustration for pt. Demonstrates poor frustration tolerance but is able to reengage given encouragement from [place] and son in session. Requires total A for ADLs and IADLs at this time, prior to acute event pt was fully independent. Pt desires to return to living independently. Pt stated goals are to return to managing finances, managing medications, taking care of home to include household chores, and meal planning and grocery shopping for self. ? ?OBJECTIVE  IMPAIRMENTS include attention, memory, and executive functioning. These impairments are limiting  patient from managing medications, managing appointments, managing finances, household responsibilities, and

## 2021-08-06 NOTE — Progress Notes (Signed)
Inpatient Rehabilitation Care Coordinator ?Discharge Note  ? ?Patient Details  ?Name: Lauren Conner ?MRN: [id number] ?Date of Birth: 1941/05/06 ? ? ?Discharge location: D/c to home with support from her son Lauren Conner and DIL. ? ?Length of Stay: 9 days ? ?Discharge activity level: Supervision-Modified independent using a Rollator ? ?Home/community participation: Limited ? ?Patient response TG:YBWLSL Literacy - How often do you need to have someone help you when you read instructions, pamphlets, or other written material from your doctor or pharmacy?: Never ? ?Patient response HT:DSKAJG Isolation - How often do you feel lonely or isolated from those around you?: Never ? ?Services provided included: MD, RD, PT, OT, SLP, RN, Pharmacy, SW, Neuropsych, TR, CM ? ?Financial Services:  ?Charity fundraiser Utilized: Medicare ?  ? ?Choices offered to/list presented to: Yes ? ?Follow-up services arranged:  ?DME, Outpatient ?   ?Outpatient Servicies: [facility] for outpatient PT/OT/SLP ?DME : [facility] for rollator ?  ? ?Patient response to transportation need: ?Is the patient able to respond to transportation needs?: Yes ?In the past 12 months, has lack of transportation kept you from medical appointments or from getting medications?: No ?In the past 12 months, has lack of transportation kept you from meetings, work, or from getting things needed for daily living?: No ? ? ?Comments (or additional information): ? ?Patient/Family verbalized understanding of follow-up arrangements:  Yes ? ?Individual responsible for coordination of the follow-up plan: contact pt son Lauren Conner or Lauren Conner ? ?Confirmed correct DME delivered: Lauren Conner 08/06/2021   ? ?Lauren Conner ?

## 2021-08-07 ENCOUNTER — Ambulatory Visit: Payer: Medicare Other

## 2021-08-07 ENCOUNTER — Telehealth: Payer: Self-pay

## 2021-08-07 ENCOUNTER — Encounter: Payer: Medicare Other | Admitting: Speech Pathology

## 2021-08-07 DIAGNOSIS — R339 Retention of urine, unspecified: Secondary | ICD-10-CM

## 2021-08-07 NOTE — Telephone Encounter (Signed)
Son notified and asked to fax order to [facility] in [place], [place]  ?

## 2021-08-07 NOTE — Telephone Encounter (Signed)
Stationary bags and leg bags were ordered under DME. #5 of each. (No other place for I could find for a generic order).  ? ?She should not be changing out these bags every day. One can be reused for quite awhile.  ?

## 2021-08-07 NOTE — Telephone Encounter (Signed)
Spoke to [place] the son of the patient and he states that at discharge they was told to change out patient cath bag at night. She has a leg bag for daytime and she has a night bag. Patient was sent home with one bag each. Son is asking for an order to get the bags. ?

## 2021-08-07 NOTE — Telephone Encounter (Signed)
Transitional Care call--son [name] ? ? ? ?Are you/is patient experiencing any problems since coming home? No Are there any questions regarding any aspect of care? No ?Are there any questions regarding medications administration/dosing? No Are meds being taken as prescribed? Yes Patient should review meds with caller to confirm ?Have there been any falls? No ?Has Home Health been to the house and/or have they contacted you? Yes Outpatient therapy If not, have you tried to contact them? Can we help you contact them? ?Are bowels and bladder emptying properly? Yes Are there any unexpected incontinence issues? No If applicable, is patient following bowel/bladder programs? ?Any fevers, problems with breathing, unexpected pain? no ?Are there any skin problems or new areas of breakdown? No ?Has the patient/family member arranged specialty MD follow up (ie cardiology/neurology/renal/surgical/etc)? Yes  Can we help arrange? ?Does the patient need any other services or support that we can help arrange? No ?Are caregivers following through as expected in assisting the patient? Yes ?Has the patient quit smoking, drinking alcohol, or using drugs as recommended? Yes ? ?Appointment time 9:00 am, arrive time 8:30 am with Danella Sensing, NP ?[address]   ?

## 2021-08-08 ENCOUNTER — Ambulatory Visit: Payer: Medicare Other | Admitting: Occupational Therapy

## 2021-08-08 ENCOUNTER — Encounter: Payer: Medicare Other | Admitting: Occupational Therapy

## 2021-08-08 ENCOUNTER — Ambulatory Visit: Payer: Medicare Other | Admitting: Speech Pathology

## 2021-08-08 ENCOUNTER — Ambulatory Visit: Payer: Medicare Other | Admitting: Physical Therapy

## 2021-08-08 ENCOUNTER — Encounter: Payer: Self-pay | Admitting: Occupational Therapy

## 2021-08-08 ENCOUNTER — Other Ambulatory Visit: Payer: Self-pay

## 2021-08-08 DIAGNOSIS — M6281 Muscle weakness (generalized): Secondary | ICD-10-CM | POA: Diagnosis not present

## 2021-08-08 DIAGNOSIS — R41841 Cognitive communication deficit: Secondary | ICD-10-CM

## 2021-08-08 DIAGNOSIS — R2681 Unsteadiness on feet: Secondary | ICD-10-CM

## 2021-08-08 DIAGNOSIS — R2689 Other abnormalities of gait and mobility: Secondary | ICD-10-CM

## 2021-08-08 DIAGNOSIS — H53462 Homonymous bilateral field defects, left side: Secondary | ICD-10-CM

## 2021-08-08 DIAGNOSIS — I69318 Other symptoms and signs involving cognitive functions following cerebral infarction: Secondary | ICD-10-CM

## 2021-08-08 NOTE — Therapy (Signed)
?OUTPATIENT SPEECH LANGUAGE PATHOLOGY TREATMENT NOTE ? ? ?Patient Name: Lauren Conner ?MRN: [id number] ?DOB:28-Jul-1940, 81 y.o., female ?Today's Date: 08/08/2021 ? ?[facility]: [facility] ?REFERRING PROVIDER: Cathlyn Parsons, PA-C  ? ? End of Session - 08/08/21 1105   ? ? Visit Number 2   ? Number of Visits 17   ? Date for SLP Re-Evaluation 10/29/21   ? Authorization Type Medicare   ? Progress Note Due on Visit 10   ? SLP Start Time 1100   ? SLP Stop Time  1145   ? SLP Time Calculation (min) 45 min   ? Activity Tolerance Patient tolerated treatment well   ? ?  ?  ? ?  ? ? ?No past medical history on file. ?No past surgical history on file. ?Patient Active Problem List  ? Diagnosis Date Noted  ? Intraparenchymal hemorrhage of brain ([place]) 07/26/2021  ? Bronchiectasis with acute exacerbation ([place])   ? Encephalopathy acute 07/21/2021  ? Intracerebral hemorrhage 07/21/2021  ? Acute renal failure ([place])   ? Acute urinary retention 07/20/2021  ? Bilateral hydronephrosis 07/20/2021  ? Osteoporosis without current pathological fracture 07/20/2021  ? CKD (chronic kidney disease) stage 4, GFR 15-29 ml/min (H[facility]) 07/19/2021  ? Paroxysmal SVT (supraventricular tachycardia) ([place]) 07/19/2021  ? Severe sepsis ([place]) 07/19/2021  ? Pneumonia of both lungs due to infectious organism 07/19/2021  ? Acute metabolic encephalopathy 40/81/4481  ? Anemia of chronic kidney failure, stage 4 (severe) ([place]) 07/19/2021  ? Metabolic acidosis 85/63/1497  ? Hyponatremia 07/19/2021  ? Essential hypertension 11/17/2018  ? Hypothyroidism (acquired) 12/06/2015  ? Adult bronchiectasis ([place]) 03/25/2011  ? ? ?ONSET DATE: 07/19/2021 ? ?REFERRING DIAG: I61.9 (ICD-10-CM) - Intraparenchymal hemorrhage of brain ([place])  ? ?THERAPY DIAG:  ?Cognitive communication deficit ? ?SUBJECTIVE: "I've been exercising hard. I'm going to give it my all." ? ?PAIN:  ?Are you having pain? No ? ?OBJECTIVE:  ? ?TODAY'S TREATMENT: ?08-08-21: Resumed administration of CLQT this  date. During clock drawing, pt noted to require extended processing time and overall have conceptual deficits. Following alloted time, pt has written 12 and 6 (in correct spot) with a line from each. Tells ST "I don't know how to do this." Use of dynamic assessment, pt requires max A to fill in other numbers, continue to demonstrate incorrect spacing, indicating planning deficits. Unable to place hands at correct time, even when modified with direct language. Relative strength in naming animals, has difficulty with letter /m/. Overall in conversation, pt answers accurately, no overt word finding difficulties observed. Relative strength in symbol trails, noted to self cue to "look at the whole thing." Based on subtests administered this date, pt scores in mod range for memory and mild range for language.  ? ?ST will plan to administer Cognitive Function PROM next session to allow pt more time at home to accurately gauge how cognition is affecting IADLs and ADLs since pt has only been home from hospital 3 days.  ? ?08-06-21: Use of interviewing to gain insight into pt's goals, prior level of function, and present challenges. Initiated CLQT+ this date, will complete in subsequent session. Provide education re: POC, goals, evaluation results, ST role in rehabilitation.  ?  ?  ?PATIENT EDUCATION: ?Education details: see above ?Person educated: Patient and Child(ren) ?Education method: Explanation, Demonstration, and Handouts ?Education comprehension: verbalized understanding, returned demonstration, and needs further education ?  ?  ?  ?  ?GOALS: ?Goals reviewed with patient? Yes ?  ?SHORT TERM GOALS: Target date:  09/03/2021 ?  ?Pt will complete CLQT and PROM within first 2 sessions. ?Baseline: initiated ?Goal status: ONGOING ?  ?2.  Pt will accurately sort medications into weekly pill box with occasional min A over 2 sessions.  ?Baseline: medications being managed by care partner ?Goal status: ONGOING ?  ?3.  Pt will  report successful implementation at home of x2 attention/memory compensations with occasional mod A over 2 sessions.  ?Baseline: reports difficulty focusing; impaired memory observed in session ?Goal status: ONGOING ?  ?4.  Pt will demonstrate adeuqate organizational skills to complete mod complex therapy tasks with usual mod A over 2 sessions  ?Baseline: attention, ex fx skills inhibiting successful completion of tasks ?Goal status: ONGOING ?  ?5.  Pt will report increased household participation in x2 desired activities (shopping, meal prep, medication/financial management) with supervision from family members   ?Baseline: requires total A for ADLs and IADLS ?Goal status: ONGOING ?  ?  ?LONG TERM GOALS: Target date: 10/29/2021 ?  ?Pt will successfully manage household activities using external aids (to-do list, schedule, etc) with rare min A over 1 weeks  ?Baseline: total A for all ADLs ?Goal status: ONGOING ?  ?2.  Pt will successfully implement a bill paying system with rare min A over 1 week period to promote independence with managing finances ?Baseline: requires total A ?Goal status: ONGOING ?  ?3.  Pt will demonstrate adeuqate organizational skills to complete mod complex therapy tasks with modified independence (compensations) in 2 sessions  ?Baseline: attention, ex fx skills inhibiting successful completion of tasks ?Goal status: ONGOING ?  ?  ?ASSESSMENT: ?  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 Pt presents with impairments in domains of executive function, attention, memory, and problem solving. Pt reports difficulty with simple every day tasks such as using remote or cell phone. Demonstrates intellectual and emergent awareness which causes frustration for pt. Demonstrates poor frustration tolerance but is able to reengage given encouragement from [place] and son in session. Requires total A for ADLs and IADLs at this time,  prior to acute event pt was fully independent. Pt desires to return to living independently. Pt stated goals are to return to managing finances, managing medications, taking care of home to include household chores, and meal planning and grocery shopping for self. ?  ?OBJECTIVE IMPAIRMENTS include attention, memory, and executive functioning. These impairments are limiting patient from managing medications, managing appointments, managing finances, household responsibilities, and ADLs/IADLs. ?Patient will benefit from skilled SLP services to address above impairments and improve overall function. ?  ?REHAB POTENTIAL: Good ?  ?PLAN: ?SLP FREQUENCY: 2x/week ?  ?SLP DURATION: 12 weeks ?  ?PLANNED INTERVENTIONS: Environmental controls, Cueing hierachy, Cognitive reorganization, Internal/external aids, Functional tasks, SLP instruction and feedback, and Compensatory strategies ?  ?  ?  ?Su Monks, CF-SLP ?08/08/2021, 12:20 PM ?  ?
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
## 2021-08-08 NOTE — Patient Instructions (Signed)
? ?  I can't do it yet, but I am working on it! ? ?Remember that your brain suffered an injury and is still healing. You WILL see improvements, especially if you keep working hard! ? ? ?

## 2021-08-08 NOTE — Therapy (Signed)
?OUTPATIENT OCCUPATIONAL THERAPY TREATMENT NOTE ? ? ?Patient Name: Lauren Conner ?MRN: [id number] ?DOB:[birth year], 81 y.o., female ?Today's Date: 08/08/2021 ? ?[facility] ?REFERRING PROVIDER: Dr. Naaman Plummer ? ? OT End of Session - 08/08/21 1117   ? ? Visit Number 2   ? Number of Visits 17   ? Date for OT Re-Evaluation 10/01/21   ? Authorization Type MCR, [employer]   ? Authorization - Visit Number 1   ? Progress Note Due on Visit 10   ? OT Start Time 1021   ? OT Stop Time 1100   ? OT Time Calculation (min) 39 min   ? Activity Tolerance Patient tolerated treatment well   ? Behavior During Therapy [facility] for tasks assessed/performed   ? ?  ?  ? ?  ? ? ? ?Patient Active Problem List  ? Diagnosis Date Noted  ? 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 Bronchiectasis with acute exacerbation ([place])   ? Encephalopathy acute 07/21/2021  ? Intracerebral hemorrhage 07/21/2021  ? Acute renal failure ([place])   ? Acute urinary retention 07/20/2021  ? Bilateral hydronephrosis 07/20/2021  ? Osteoporosis without current pathological fracture 07/20/2021  ? CKD (chronic kidney disease) stage 4, GFR 15-29 ml/min (HCC) 07/19/2021  ? Paroxysmal SVT (supraventricular tachycardia) ([place]) 07/19/2021  ? Severe sepsis ([place]) 07/19/2021  ? Pneumonia of both lungs due to infectious organism 07/19/2021  ? Acute metabolic encephalopathy 96/22/2979  ? Anemia of chronic kidney failure, stage 4 (severe) ([place]) 07/19/2021  ? Metabolic acidosis 89/21/1941  ? Hyponatremia 07/19/2021  ? Essential hypertension 11/17/2018  ? Hypothyroidism (acquired) 12/06/2015  ? Adult bronchiectasis ([place]) 03/25/2011  ? ? ?ONSET DATE: 07/19/21 ? ?REFERRING DIAG: I61.9 (ICD-10-CM) - Intraparenchymal hemorrhage of brain ([place])  ? ?THERAPY DIAG:  ?Left homonymous hemianopsia -  ?Unsteadiness on feet - ?Muscle weakness (generalized) -  ?Other symptoms and signs involving cognitive functions following cerebral infarction  ? ?SUBJECTIVE:  ? ?SUBJECTIVE  STATEMENT: ?No pain ? ? ?PERTINENT HISTORY: [name] is a 81 y.o. right-handed female with history of anemia of chronic disease, paroxysmal SVT hypothyroidism hypertension CKD stage IV followed by Dr.Lehrich with nephrology at [facility] with creatinine baseline 2.0.  ? ?PRECAUTIONS: Fall and Other: no driving, abdominal binder d/t orthostatic hypotension, foley catheter, and Lt visual field cut ? ?WEIGHT BEARING RESTRICTIONS No ? ? ?SUBJECTIVE: Pt denies pain ? ?PAIN:  ?Are you having pain? No ? ? ? ? ?OBJECTIVE:  ? ?TODAY'S TREATMENT: ?Visual scanning activities: Large pint number cancellation (60M)with only 1 omission on first pass, pt located item second pass. ?Copying telephone numbers 60M errors on 3/8 items ?Pt was provided with visual compensation strategies and activities to encourage scanning at home. ?Copying basic peg design for improved visual perceptual skills, mod -max difficulty v.c for correct design, cues for head turn and scanning. ? ? ? ?GOALS: ? ? ?SHORT TERM GOALS: Target date: 09/03/2021 ? ?Pt will be independent with HEP targeting overall endurance ? ?Baseline: n/a ?Goal status: INITIAL ? ?2.  Pt to verbalize understanding with visual scanning strategies ?Baseline: dep ?Goal status: INITIAL ? ?3.  Pt will perform basic snack prep w/ supervision  ?Baseline: dep ?Goal status: INITIAL ? ?4.  Pt to perform tabletop scanning attending to Lt side 75% or greater ?Baseline: dep ?Goal status: INITIAL ? ?5.  Pt to verbalize understanding with memory compensatory strategies ?Baseline: dep ?Goal status: INITIAL ? ? ? ?LONG TERM GOALS: Target date: 10/01/2021 ? ?Pt will perform  simple stovetop cooking task and light housekeeping at mod I and good safety awareness ?Baseline: dep ?Goal status: INITIAL ? ?2.  Pt will perform environmental scanning with 85% accuracy in a min distracting environment.   ?Baseline: dep ?Goal status: INITIAL ? ?3.  Pt to perform BADLS I'ly  ?Baseline: min assist for  shower transfers and dressing ?Goal status: INITIAL ? ?4.  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 ?5.  Pt to perform dynamic standing tasks safely w/o LOB ?Baseline: unsteadiness ?Goal status: INITIAL ? ? ?ASSESSMENT: ? ?CLINICAL IMPRESSION: ?Pt is progressing towards goals. She demonstrates significant visual impairments and will benefit from continued OT to address. ?PERFORMANCE DEFICITS in functional skills including ADLs, IADLs, sensation, strength, FMC, mobility, balance, endurance, decreased knowledge of precautions, decreased knowledge of use of DME, vision, and UE functional use, cognitive skills including attention, memory, perception, problem solving, safety awareness, and sequencing,  ? ?IMPAIRMENTS are limiting patient from ADLs, IADLs, and social participation.  ? ?COMORBIDITIES may have co-morbidities  that affects occupational performance. Patient will benefit from skilled OT to address above impairments and improve overall function. ? ?MODIFICATION OR ASSISTANCE TO COMPLETE EVALUATION: Min-Moderate modification of tasks or assist with assess necessary to complete an evaluation. ? ?OT OCCUPATIONAL PROFILE AND HISTORY: Detailed assessment: Review of records and additional review of physical, cognitive, psychosocial history related to current functional performance. ? ?CLINICAL DECISION MAKING: Moderate - several treatment options, min-mod task modification necessary ? ?REHAB POTENTIAL: Good ? ?EVALUATION COMPLEXITY: Moderate ? ? ? ?PLAN: ?OT FREQUENCY: 2x/week ? ?OT DURATION: 8 weeks ? ?PLANNED INTERVENTIONS: self care/ADL training, therapeutic exercise, therapeutic activity, neuromuscular re-education, manual therapy, functional mobility training, patient/family education, cognitive remediation/compensation, visual/perceptual remediation/compensation, coping strategies training, and DME  and/or AE instructions ? ?RECOMMENDED OTHER SERVICES: n/a ? ?CONSULTED AND AGREED WITH PLAN OF CARE: Patient  ? ?PLAN FOR NEXT SESSION: environmental scanning tasks, consider clock mathcing or card matching task ? ? ? ?Sahmya Arai, OT ?08/08/2021, 11:23 AM ? ?  ? ?  ?

## 2021-08-08 NOTE — Patient Instructions (Signed)
1. Look for the edge of objects (to the left and/or right) so that you make sure you are seeing all of an object ?2. Turn your head when walking, scan from side to side, particularly in busy environments ?3. Use an organized scanning pattern. It's usually easier to scan from top to bottom, and left to right (like you are reading) ?4. Double check yourself ?5. Use a line guide (like a blank piece of paper) or your finger when reading ?6. If necessary, place brightly colored tape at end of table or work area as a reminder to always look until you see the tape.   ? ? ? ?Activities to try at home to encourage visual scanning:  ? ?1. Word searches ?2. Mazes ?3. Puzzles ?4. Card games ?5. Computer games and/or searches ?6. Connect-the-dots ? ?Activities for environmental (larger) scanning:  ?1. With supervision, scan for items in grocery store or drugstore.  ?Begin with a familiar store, then progress to a new store you've never been in before. Make sure you have supervision with this.  ?

## 2021-08-11 ENCOUNTER — Encounter: Payer: Medicare Other | Attending: Registered Nurse | Admitting: Registered Nurse

## 2021-08-11 ENCOUNTER — Encounter: Payer: Self-pay | Admitting: Registered Nurse

## 2021-08-11 ENCOUNTER — Other Ambulatory Visit: Payer: Self-pay

## 2021-08-11 VITALS — BP 128/78 | HR 100 | Ht 63.0 in | Wt 121.0 lb

## 2021-08-11 DIAGNOSIS — I10 Essential (primary) hypertension: Secondary | ICD-10-CM | POA: Diagnosis present

## 2021-08-11 DIAGNOSIS — I619 Nontraumatic intracerebral hemorrhage, unspecified: Secondary | ICD-10-CM

## 2021-08-11 DIAGNOSIS — N1330 Unspecified hydronephrosis: Secondary | ICD-10-CM

## 2021-08-11 DIAGNOSIS — R338 Other retention of urine: Secondary | ICD-10-CM

## 2021-08-11 NOTE — Progress Notes (Signed)
? ?Subjective:  ? ? Patient ID: Lauren Conner, female    DOB: 1941/02/16, 81 y.o.   MRN: [id number] ? ?HPI: Lauren Conner is a 81 y.o. female who is here for Transitional Care Visit for follow up of her Intraparenchymal hemorrhage of brain, Essential Hypertension, Hypothyroidism and Acute Urinary Retention with Significant bilateral hydronephrosis. Lauren Conner presented to [facility] on 07/19/2021, for Altered Mental Status.  ? ?Dr Damita Dunnings H&Pon 07/19/2021 ?HPI: Lauren Conner is a 81 y.o. female with medical history significant for CKD4, anemia of CKD, paroxysmal SVT, hypothyroidism, bronchiectasis, HTN, who lives alone and is functionally independent who was brought in for evaluation of weakness and confusion that was noted on the day of arrival.  History is limited due to altered mental status patient appeared to complain of right-sided flank pain ?ED course: On arrival febrile to 100.5 tachycardic to 124 and tachypneic to 23-24.  BP 154/121, O2 sat of 100% on room air ?Blood work significant for leukocytosis of 17,000 with lactic acid 3.5.  Creatinine 2.13, close to baseline of 2, with bicarb of 17.  Sodium 126.  Troponin 33.  Urinalysis with proteinuria but not consistent with UTI.  COVID and flu negative ?EKG, personally reviewed and interpreted: Sinus tachycardia at 127 with no acute ST-T wave changes ?Chest x-ray showing patchy airspace opacities throughout the right lung and at the left lung base with differential considerations including multifocal pneumonia, including atypical organisms or asymmetric pulmonary edema. ?Patient started on fluids per sepsis protocol, IV cefepime Vanco and Flagyl.  Hospitalist consulted for admission.  ?  ?Following admission, CT abdomen and pelvis ordered for right flank pain with finding of severe bilateral hydronephrosis and proximal hydroureter ureter with severely distended bladder.  Foley catheter ordered for acute urinary retention ? ?CT Abdomen and Pelvis:  ?IMPRESSION: ?1. Marked  severity bibasilar multifocal infiltrates. ?2. Marked severity bilateral hydronephrosis and proximal hydroureter ?in the setting of a markedly distended urinary bladder. ?3. Large hiatal hernia. ?4. Colonic diverticulosis. ?5. Marked severity multilevel degenerative changes throughout the ?lumbar spine. ?6. Aortic atherosclerosis. ?  ?Aortic Atherosclerosis (ICD10-I70.0). ?  ?CT Chest WO Contrast:  ?IMPRESSION: ?1. 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 Given the widespread micro and ?macronodularity, acute superinfection is not excluded. ?2. Large hiatal hernia. ?3. Aortic atherosclerosis. ?  ?Aortic Atherosclerosis (ICD10-I70.0). ?  ?CT: Head WO Contrast:  ?IMPRESSION: ?Large area of acute intraparenchymal hemorrhage within the RIGHT ?temporoparietal lobe 4.0 x 4.0 x 4.1 cm with surrounding vasogenic ?edema and mild effacement of the occipital horn and atrium of the ?RIGHT lateral ventricle. ?  ?Small amount of subarachnoid hemorrhage within sulci at the anterior ?RIGHT temporal lobe. ?  ?3 mm of RIGHT to LEFT midline shift. ?  ?Underlying small vessel chronic ischemic changes of deep cerebral ?white matter. ?  ?MRI/MRA: ?IMPRESSION: ?MRI HEAD IMPRESSION: ?  ?1. 25 mL intraparenchymal hematoma centered at the right ?temporoccipital region. No visible underlying mass lesion or other ?structural abnormality on this noncontrast examination. Associated ?vasogenic edema with trace 3 mm of right-to-left shift. ?2. Associated small volume subarachnoid hemorrhage within the ?adjacent right cerebral hemisphere. Trace intraventricular ?hemorrhage likely related to redistribution. No hydrocephalus or ?trapping. ?3. Underlying age-related cerebral atrophy with moderately advanced ?chronic microvascular ischemic disease. ?4. Small remote left cerebellar infarct. ?  ?MRA HEAD IMPRESSION: ?  ?1. No vascular abnormality  seen underlying the right cerebral ?hemorrhage. Negative intracranial MRA ?  2. For large vessel occlusion. Mild atheromatous irregularity about ?the carotid siphons without hemodynamically significant or ?correctable stenosis. ?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 ?  ?Neurology, Urology and Renal Consulted.  ?Lauren Conner was transferred to [facility] .  ? ?Lauren Conner was admitted to inpatient rehabilitation on 07/26/2021 and discharged home on 08/05/2021. She is receiving outpatient therapy at [facility]. She denies any pain. She rates her pain 0. Also reports she has a good appetite.  ? ?Son in room, all questions answered.  ? ?  ? ?Pain Inventory ?Average Pain 4 ?Pain Right Now 0 ?My pain is intermittent and dull ? ?LOCATION OF PAIN  head ? ?BOWEL ?Number of stools per week: 7 ?Oral laxative use Yes  ?Type of laxative stool softner ?Enema or suppository use  ?History of colostomy No  ?Incontinent No  ? ?BLADDER ?Foley ?In and out cath, frequency na ?Able to self cath  na ?Bladder incontinence No  ?Frequent urination No  ?Leakage with coughing No  ?Difficulty starting stream No  ?Incomplete bladder emptying No  ? ? ?Mobility ?walk with assistance ?how many minutes can you walk? 5-10 ?ability to climb steps?  yes ?do you drive?  no ? ?Function ?retired ?I need assistance with the following:  dressing, bathing, meal prep, household duties, and shopping ? ?Neuro/Psych ?bladder control problems ?tremor ?trouble walking ?confusion ?depression ?anxiety ? ?Prior Studies ?TC appt ? ?Physicians involved in your care ?TC appt ? ? ?No family history on file. ?Social History  ? ?Socioeconomic History  ? Marital status: Widowed  ?  Spouse name: Not on file  ? Number of children: Not on file  ? Years of education: Not on file  ? Highest education level: Not on file  ?Occupational History  ? Not on file  ?Tobacco Use  ? Smoking status: Never  ? Smokeless tobacco: Never  ?Substance and Sexual  Activity  ? Alcohol use: Not on file  ? Drug use: Not on file  ? Sexual activity: Not on file  ?Other Topics Concern  ? Not on file  ?Social History Narrative  ? Not on file  ? ?Social Determinants of Health  ? ?Financial Resource Strain: Not on file  ?Food Insecurity: Not on file  ?Transportation Needs: Not on file  ?Physical Activity: Not on file  ?Stress: Not on file  ?Social Connections: Not on file  ? ?No past surgical history on file. ?No past medical history on file. ?BP 128/78   Pulse (!) 101   Ht 5\' 3"  (1.6 m)   Wt 121 lb (54.9 kg)   SpO2 97%   BMI 21.43 kg/m?  ? ?Opioid Risk Score:   ?Fall Risk Score:  `1 ? ?Depression screen PHQ 2/9 ? ?   ? View : No data to display.  ?  ?  ?  ?  ? ?Review of Systems ? ?   ?Objective:  ? Physical Exam ?Vitals and nursing note reviewed.  ?Constitutional:   ?   Appearance: Normal appearance.  ?Cardiovascular:  ?   Rate and Rhythm: Normal rate and regular rhythm.  ?   Pulses: Normal pulses.  ?   Heart sounds: Normal heart sounds.  ?Pulmonary:  ?   Effort: Pulmonary effort is normal.  ?   Breath sounds: Normal breath sounds.  ?Genitourinary: ?   Comments: Leg Bag: With Clear Yellow Urine ?Musculoskeletal:  ?   Cervical back: Normal range of motion and neck supple.  ?  Comments: Normal Muscle Bulk and Muscle Testing Reveals:  ?Upper Extremities: Full ROM and Muscle Strength on Right 5/5 and Left 4/5  ? Lower Extremities : Full ROM and Muscle Strength 5/5 ?Arises from Table Slowly  using walker for support ?Narrow Based Gait  ?   ?Skin: ?   General: Skin is warm and dry.  ?Neurological:  ?   Mental Status: She is alert and oriented to person, place, and time.  ?Psychiatric:     ?   Mood and Affect: Mood normal.     ?   Behavior: Behavior normal.  ? ? ? ? ?   ?Assessment & Plan:  ?Intraparenchymal hemorrhage of brain: [facility] was called today, to scheduled an appointment, no answer. Left message to call Lauren Conner  son, to scheduled appointment. ?Essential  Hypertension: Continue current medication regimen. She has a scheduled PCP appointment today. Continue to monitor.  ?Hypothyroidism: Continue current medication regimen. PCP Following. Continue to Monitor.  ? Acut

## 2021-08-12 ENCOUNTER — Ambulatory Visit: Payer: Medicare Other | Admitting: Physical Therapy

## 2021-08-12 ENCOUNTER — Ambulatory Visit: Payer: Medicare Other

## 2021-08-12 ENCOUNTER — Ambulatory Visit: Payer: Medicare Other | Admitting: Occupational Therapy

## 2021-08-12 ENCOUNTER — Encounter: Payer: Medicare Other | Admitting: Occupational Therapy

## 2021-08-12 ENCOUNTER — Ambulatory Visit: Payer: Medicare Other | Admitting: Speech Pathology

## 2021-08-12 ENCOUNTER — Encounter: Payer: Medicare Other | Admitting: Speech Pathology

## 2021-08-12 ENCOUNTER — Encounter: Payer: Self-pay | Admitting: Occupational Therapy

## 2021-08-12 DIAGNOSIS — M6281 Muscle weakness (generalized): Secondary | ICD-10-CM

## 2021-08-12 DIAGNOSIS — R41841 Cognitive communication deficit: Secondary | ICD-10-CM

## 2021-08-12 DIAGNOSIS — R2681 Unsteadiness on feet: Secondary | ICD-10-CM

## 2021-08-12 DIAGNOSIS — I69318 Other symptoms and signs involving cognitive functions following cerebral infarction: Secondary | ICD-10-CM

## 2021-08-12 DIAGNOSIS — H53462 Homonymous bilateral field defects, left side: Secondary | ICD-10-CM

## 2021-08-12 NOTE — Therapy (Signed)
?OUTPATIENT OCCUPATIONAL THERAPY TREATMENT NOTE ? ? ?Patient Name: KESHAYLA SCHRUM ?MRN: [id number] ?DOB:[birth year], 81 y.o., female ?Today's Date: 08/08/2021 ? ?PCP: [facility] ?REFERRING PROVIDER: Dr. Naaman Plummer ? ? OT End of Session - 08/08/21 1117   ? ? Visit Number 2   ? Number of Visits 17   ? Date for OT Re-Evaluation 10/01/21   ? Authorization Type MCR, [employer]   ? Authorization - Visit Number 1   ? Progress Note Due on Visit 10   ? OT Start Time 1021   ? OT Stop Time 1100   ? OT Time Calculation (min) 39 min   ? Activity Tolerance Patient tolerated treatment well   ? Behavior During Therapy [facility] for tasks assessed/performed   ? ?  ?  ? ?  ? ? ? ?Patient Active Problem List  ? Diagnosis Date Noted  ? 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 Bronchiectasis with acute exacerbation ([place])   ? Encephalopathy acute 07/21/2021  ? Intracerebral hemorrhage 07/21/2021  ? Acute renal failure ([place])   ? Acute urinary retention 07/20/2021  ? Bilateral hydronephrosis 07/20/2021  ? Osteoporosis without current pathological fracture 07/20/2021  ? CKD (chronic kidney disease) stage 4, GFR 15-29 ml/min (HCC) 07/19/2021  ? Paroxysmal SVT (supraventricular tachycardia) ([place]) 07/19/2021  ? Severe sepsis ([place]) 07/19/2021  ? Pneumonia of both lungs due to infectious organism 07/19/2021  ? Acute metabolic encephalopathy 20/25/4270  ? Anemia of chronic kidney failure, stage 4 (severe) ([place]) 07/19/2021  ? Metabolic acidosis 62/37/6283  ? Hyponatremia 07/19/2021  ? Essential hypertension 11/17/2018  ? Hypothyroidism (acquired) 12/06/2015  ? Adult bronchiectasis ([place]) 03/25/2011  ? ? ?ONSET DATE: 07/19/21 ? ?REFERRING DIAG: I61.9 (ICD-10-CM) - Intraparenchymal hemorrhage of brain ([place])  ? ?THERAPY DIAG:  ?Left homonymous hemianopsia -  ?Unsteadiness on feet - ?Muscle weakness (generalized) -  ?Other symptoms and signs involving cognitive functions following cerebral infarction  ? ?SUBJECTIVE:  ? ?SUBJECTIVE  STATEMENT: ?No pain.  "I used to play cards Delanna Ahmadi and some other games), but I haven't really played since my husband died (approx 16 years ago).  Just learning to play "marbles" game recently. ? ? ?PERTINENT HISTORY: LEXANY BELKNAP is a 81 y.o. right-handed female with history of anemia of chronic disease, paroxysmal SVT hypothyroidism hypertension CKD stage IV followed by Dr.Lehrich with nephrology at [facility] with creatinine baseline 2.0.  ? ?PRECAUTIONS: Fall and Other: no driving, abdominal binder d/t orthostatic hypotension, foley catheter, and Lt visual field cut ? ?WEIGHT BEARING RESTRICTIONS No ? ? ?SUBJECTIVE: Pt denies pain ? ?PAIN:  ?Are you having pain? No ? ? ? ? ?OBJECTIVE:  ? ?TODAY'S TREATMENT: ? ?Visual scanning to match cards by # with good accuracy, only missed 1 item and pt found with min v.c. (missed on R side). ? ?Visual scanning to match clock faces with digital times with mod-max difficulty/cueing initially, but improved with repetition and then performed with min-mod cueing (only used :30 and :00 due to max difficulty with other times when attempted initially).   Pt omitted 1 item on L and needed min cueing, found with cues.   ? ? ? ? ? ? ?GOALS: ? ? ?SHORT TERM GOALS: Target date: 09/03/2021 ? ?Pt will be independent with HEP targeting overall endurance ? ?Baseline: n/a ?Goal status: INITIAL ? ?2.  Pt to verbalize understanding with visual scanning strategies ?Baseline: dep ?Goal status: INITIAL ? ?3.  Pt will perform basic snack prep w/ supervision  ?Baseline:  dep ?Goal status: INITIAL ? ?4.  Pt to perform tabletop scanning attending to Lt side 75% or greater ?Baseline: dep ?Goal status: INITIAL ? ?5.  Pt to verbalize understanding with memory compensatory strategies ?Baseline: dep ?Goal status: INITIAL ? ? ? ?LONG TERM GOALS: Target date: 10/01/2021 ? ?Pt will perform simple stovetop cooking task and light housekeeping at mod I and good safety awareness ?Baseline: dep ?Goal status:  INITIAL ? ?2.  Pt will perform environmental scanning with 85% accuracy in a min distracting environment.   ?Baseline: dep ?Goal status: INITIAL ? ?3.  Pt to perform BADLS I'ly  ?Baseline: min assist for shower transfers and dressing ?Goal status: INITIAL ? ?4.  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 ?5.  Pt to perform dynamic standing tasks safely w/o LOB ?Baseline: unsteadiness ?Goal status: INITIAL ? ? ?ASSESSMENT: ? ?CLINICAL IMPRESSION: ?Pt progressing with improving visual scanning to the L and only needed cues to scan to L/omission on L x1.  However, pt with difficulty with with cognitive components of scanning today.   ? ?PERFORMANCE DEFICITS in functional skills including ADLs, IADLs, sensation, strength, FMC, mobility, balance, endurance, decreased knowledge of precautions, decreased knowledge of use of DME, vision, and UE functional use, cognitive skills including attention, memory, perception, problem solving, safety awareness, and sequencing,  ? ?IMPAIRMENTS are limiting patient from ADLs, IADLs, and social participation.  ? ?COMORBIDITIES may have co-morbidities  that affects occupational performance. Patient will benefit from skilled OT to address above impairments and improve overall function. ? ?MODIFICATION OR ASSISTANCE TO COMPLETE EVALUATION: Min-Moderate modification of tasks or assist with assess necessary to complete an evaluation. ? ?OT OCCUPATIONAL PROFILE AND HISTORY: Detailed assessment: Review of records and additional review of physical, cognitive, psychosocial history related to current functional performance. ? ?CLINICAL DECISION MAKING: Moderate - several treatment options, min-mod task modification necessary ? ?REHAB POTENTIAL: Good ? ?EVALUATION COMPLEXITY: Moderate ? ? ? ?PLAN: ?OT FREQUENCY: 2x/week ? ?OT DURATION: 8 weeks ? ?PLANNED INTERVENTIONS: self care/ADL  training, therapeutic exercise, therapeutic activity, neuromuscular re-education, manual therapy, functional mobility training, patient/family education, cognitive remediation/compensation, visual/perceptual remediation/compensation, coping strategies training, and DME and/or AE instructions ? ?RECOMMENDED OTHER SERVICES: n/a ? ?CONSULTED AND AGREED WITH PLAN OF CARE: Patient  ? ?PLAN FOR NEXT SESSION: simple environmental scanning, simple puzzle, memory compensation strategies ? ? ?Vianne Bulls, OTR/L ?[place] ?[place], [place]  [address] ?[phone] phone ?[phone] ?08/12/21 1:42 PM ? ? ?

## 2021-08-12 NOTE — Patient Instructions (Signed)
Memory Strategies ? ?W - Write it down ? ?Taking notes during conversations ?Make lists (grocery lists, to-do list) ?Ask your therapists and doctors for information in writing ?Refer to written information to complete tasks (for example your medications have written instructions on them) ? ?A - Associate it with something ? ?Create a routine- example would be taking your meds at same time during morning routine  ?Linking old information to new information ? ?R - Repeat it ? ?Saying out loud what you want to remember  ? ?M - Mental Image ? ?Create a picture in your mind of what you want to remember  ? ? ?When deciding if I should just use my brain to remember think about... ?Is this information complex?  ?Is the information new?  ?Is it a lot of information?  ? ?If any of these things are true, think about using external strategies!  ?Lists ?Using a calendar ?Setting a timer ?Setting a reminder  ?Taking notes ?Environmental placements  ? ?Home Task:  ?Study a detailed picture in a magazine for 1 minute, then write down everything you can remember from the picture ? ? ?  ?

## 2021-08-12 NOTE — Therapy (Signed)
?OUTPATIENT SPEECH LANGUAGE PATHOLOGY TREATMENT NOTE ? ? ?Patient Name: Lauren Conner ?MRN: [id number] ?DOB:07-24-1940, 81 y.o., female ?Today's Date: 08/12/2021 ? ?PCP: [facility] ?REFERRING PROVIDER: Cathlyn Parsons, PA-C  ? ? ? ? ?No past medical history on file. ?No past surgical history on file. ?Patient Active Problem List  ? Diagnosis Date Noted  ? 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 Bronchiectasis with acute exacerbation ([place])   ? Encephalopathy acute 07/21/2021  ? Intracerebral hemorrhage 07/21/2021  ? Acute renal failure ([place])   ? Acute urinary retention 07/20/2021  ? Bilateral hydronephrosis 07/20/2021  ? Osteoporosis without current pathological fracture 07/20/2021  ? CKD (chronic kidney disease) stage 4, GFR 15-29 ml/min (HCC) 07/19/2021  ? Paroxysmal SVT (supraventricular tachycardia) ([place]) 07/19/2021  ? Severe sepsis ([place]) 07/19/2021  ? Pneumonia of both lungs due to infectious organism 07/19/2021  ? Acute metabolic encephalopathy 03/47/4259  ? Anemia of chronic kidney failure, stage 4 (severe) ([place]) 07/19/2021  ? Metabolic acidosis 56/38/7564  ? Hyponatremia 07/19/2021  ? Essential hypertension 11/17/2018  ? Hypothyroidism (acquired) 12/06/2015  ? Adult bronchiectasis ([place]) 03/25/2011  ? ? ?ONSET DATE: 07/19/2021 ? ?REFERRING DIAG: I61.9 (ICD-10-CM) - Intraparenchymal hemorrhage of brain ([place])  ? ?THERAPY DIAG:  ?Cognitive communication deficit ? ?SUBJECTIVE: "I'm going great" ? ?PAIN:  ?Are you having pain? No ? ?OBJECTIVE:  ? ?TODAY'S TREATMENT: ?08-12-21: Education provided on internal and external memory strategies. Pt generates x1 situation for each strategy presented on how/when she could implement usage. Provide education on consideration for using internal vs. external strategies (complexity, quantity, etc.). Administered cognitive fx PROM- Pt self scores 89.  ?  ?  ?PATIENT EDUCATION: ?Education details: see above ?Person educated: Patient and  Child(ren) ?Education method: Explanation, Demonstration, and Handouts ?Education comprehension: verbalized understanding, returned demonstration, and needs further education ?  ?  ?  ?  ?GOALS: ?Goals reviewed with patient? Yes ?  ?SHORT TERM GOALS: Target date: 09/03/2021 ?  ?Pt will complete CLQT and PROM within first 2 sessions. ?Baseline: initiated ?Goal status: ONGOING ?  ?2.  Pt will accurately sort medications into weekly pill box with occasional min A over 2 sessions.  ?Baseline: medications being managed by care partner ?Goal status: ONGOING ?  ?3.  Pt will report successful implementation at home of x2 attention/memory compensations with occasional mod A over 2 sessions.  ?Baseline: reports difficulty focusing; impaired memory observed in session ?Goal status: ONGOING ?  ?4.  Pt will demonstrate adeuqate organizational skills to complete mod complex therapy tasks with usual mod A over 2 sessions  ?Baseline: attention, ex fx skills inhibiting successful completion of tasks ?Goal status: ONGOING ?  ?5.  Pt will report increased household participation in x2 desired activities (shopping, meal prep, medication/financial management) with supervision from family members   ?Baseline: requires total A for ADLs and IADLS ?Goal status: ONGOING ?  ?  ?LONG TERM GOALS: Target date: 10/29/2021 ?  ?Pt will successfully manage household activities using external aids (to-do list, schedule, etc) with rare min A over 1 weeks  ?Baseline: total A for all ADLs ?Goal status: ONGOING ?  ?2.  Pt will successfully implement a bill paying system with rare min A over 1 week period to promote independence with managing finances ?Baseline: requires total A ?Goal status: ONGOING ?  ?3.  Pt will demonstrate adeuqate organizational skills to complete mod complex therapy tasks with modified independence (compensations) in 2 sessions  ?Baseline: attention, ex fx skills inhibiting successful completion  of tasks ?Goal status: ONGOING ? ?4.  Pt will demonstrate 5 pt improvement on Cognitive Fx - Abilities PROM by d/c.  ?Baseline: 89 ?Goal Status: NEW GOAL ?  ?  ?ASSESSMENT: ?  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 Pt presents with impairments in domains of executive function, attention, memory, and problem solving. Pt reports difficulty with simple every day tasks such as using remote or cell phone. Demonstrates intellectual and emergent awareness which causes frustration for pt. Demonstrates poor frustration tolerance but is able to reengage given encouragement from [place] and son in session. Requires total A for ADLs and IADLs at this time, prior to acute event pt was fully independent. Pt desires to return to living independently. Pt stated goals are to return to managing finances, managing medications, taking care of home to include household chores, and meal planning and grocery shopping for self. ?  ?OBJECTIVE IMPAIRMENTS include attention, memory, and executive functioning. These impairments are limiting patient from managing medications, managing appointments, managing finances, household responsibilities, and ADLs/IADLs. ?Patient will benefit from skilled SLP services to address above impairments and improve overall function. ?  ?REHAB POTENTIAL: Good ?  ?PLAN: ?SLP FREQUENCY: 2x/week ?  ?SLP DURATION: 12 weeks ?  ?PLANNED INTERVENTIONS: Environmental controls, Cueing hierachy, Cognitive reorganization, Internal/external aids, Functional tasks, SLP instruction and feedback, and Compensatory strategies ?  ?  ?  ?Su Monks, CF-SLP ?08/12/2021, 1:21 PM ?  ?

## 2021-08-13 ENCOUNTER — Encounter: Payer: Medicare Other | Admitting: Occupational Therapy

## 2021-08-13 ENCOUNTER — Ambulatory Visit: Payer: Medicare Other | Admitting: Physical Therapy

## 2021-08-13 NOTE — Therapy (Signed)
?OUTPATIENT PHYSICAL THERAPY TREATMENT NOTE- Arrived no charge ? ? ?Patient Name: Lauren Conner ?MRN: [id number] ?DOB:Apr 03, 1941, 81 y.o., female ?Today's Date: 08/13/2021 ? ?[facility]P: [facility] ?REFERRING PROVIDER: Cathlyn Parsons, PA-C  ? ? PT End of Session - 08/12/21 1700   ? ? Visit Number 2   ? Number of Visits 17   Plus eval  ? Date for PT Re-Evaluation 10/29/21   ? Authorization Type Medicare A & B   ? Progress Note Due on Visit 10   ? Equipment Utilized During Treatment Gait belt;Other (comment)   Ted hose, abdominal binder  ? ?  ?  ? ?  ? ? ? ?No past medical history on file. ?No past surgical history on file. ?Patient Active Problem List  ? Diagnosis Date Noted  ? Intraparenchymal hemorrhage of brain ([place]) 07/26/2021  ? Bronchiectasis with acute exacerbation ([place])   ? Encephalopathy acute 07/21/2021  ? Intracerebral hemorrhage 07/21/2021  ? Acute renal failure ([place])   ? Acute urinary retention 07/20/2021  ? Bilateral hydronephrosis 07/20/2021  ? Osteoporosis without current pathological fracture 07/20/2021  ? CKD (chronic kidney disease) stage 4, GFR 15-29 ml/min (HCC) 07/19/2021  ? Paroxysmal SVT (supraventricular tachycardia) ([place]) 07/19/2021  ? Severe sepsis ([place]) 07/19/2021  ? Pneumonia of both lungs due to infectious organism 07/19/2021  ? Acute metabolic encephalopathy 50/93/2671  ? Anemia of chronic kidney failure, stage 4 (severe) ([place]) 07/19/2021  ? Metabolic acidosis 24/58/0998  ? Hyponatremia 07/19/2021  ? Essential hypertension 11/17/2018  ? Hypothyroidism (acquired) 12/06/2015  ? Adult bronchiectasis ([place]) 03/25/2011  ? ? ?REFERRING DIAG: I61.9 (ICD-10-CM) - Intraparenchymal hemorrhage of brain ([place])  ? ?THERAPY DIAG:  ?Unsteadiness on feet ? ?Muscle weakness (generalized) ? ?Other abnormalities of gait and mobility ? ?PERTINENT HISTORY: anemia of chronic disease, paroxysmal SVT hypothyroidism hypertension CKD stage IV  ? ?PRECAUTIONS: Fall and orthostatic. L homonymous hemianopsia   ? ?SUBJECTIVE: Pt unaware that she had 45 minute break between OT and PT appointment, requested to cancel PT appointment due to not wanting to wait between sessions.  ? ?PAIN:  ?Are you having pain? No ? ?  ?OBJECTIVE:  ?  ?DIAGNOSTIC FINDINGS: Cranial CT scan showed large area of acute intraparenchymal hemorrhage within the right temporoparietal lobe 4.0 x 4.0 x 4.1 cm with surrounding vasogenic edema with mild effacement of the occipital horn and atrium of the right lateral ventricle.  Small amount of subarachnoid hemorrhage within [place] at the anterior right temporal lobe.  3 mm right to left midline shift.  ?  ?VITALS: taken in LUE while seated ?-Pre-session BP 125/79 mmHg, HR 95 bpm ? ?  ?COGNITION: ?Overall cognitive status: Impaired: Memory: Deficits impaired STM ?Awareness: Deficits impaired safety awareness ?Problem solving: Deficits poor executive reasoning  ?Behavior: Impulsive ? ? ?TODAY'S TREATMENT:  ? See subjective ?  ?  ?PATIENT EDUCATION: ?Education details: Merrilee Jansky results, goal updates, initial HEP and safe setup at home  ?Person educated: Patient and Child(ren) ?Education method: Explanation, Demonstration, and Verbal cues ?Education comprehension: verbalized understanding and needs further education ?  ?  ?HOME EXERCISE PROGRAM: ?Access Code: [email or web address] ?URL: [email or web address] ?Date: 08/08/2021 ?Prepared by: Mickie Bail Alvina Strother ? ?Exercises ?- Sit to Stand with Resistance Around Legs  - 1 x daily - 7 x weekly - 3 sets - 10 reps ?- Heel to toe walking   - 1 x daily - 7 x weekly - 3 sets - 10 reps ?- Side Stepping with Resistance at Thighs and Counter Support  -  1 x daily - 7 x weekly - 3 sets - 10 reps ?- Standing March with Counter Support  - 1 x daily - 7 x weekly - 3 sets - 10 reps - 2 second hold ?  ?  ?  ?GOALS: ?Goals reviewed with patient? Yes ?  ?SHORT TERM GOALS: Target date: 09/03/2021 ?  ?Pt will perform initial HEP with S* for improved strength, balance, transfers and gait. ?   ?Baseline:  ?Goal status: INITIAL ?  ?2.  Pt will improve gait velocity to at least 2.3 ft/s with LRAD and S* for improved gait efficiency and functional mobility ?Baseline: 1.77 ft/s with rollator w/CGA ?Goal status: INITIAL ?  ?3.  Pt will improve Berg score to 42/56 for decreased fall risk ? ?Baseline: 35/56 ?Goal status: INITIAL ?  ?4.  Pt will improve 30s STS to 11 repetitions or greater without BUE support to demonstrate improved functional strength and transfer efficiency.   ?Baseline: 8 reps without BUE support  ?Goal status: INITIAL ?  ?  ?LONG TERM GOALS: Target date: 10/01/2021 ?  ?Pt will ascend/descend 4 steps using step-to pattern and LRAD mod I to imitate home environment  ?Baseline:  ?Goal status: INITIAL ?  ?2.  Pt will improve normal TUG to less than or equal to 15 seconds w/LRAD mod I for improved functional mobility and decreased fall risk. ?  ?Baseline: 22.17s w/rollator and CGA ?Goal status: INITIAL ?  ?3.  Pt will improve gait velocity to at least 2.7 ft/s with LRAD mod I for improved gait efficiency and performance at community ambulator level  ?  ?Baseline: 1.77 ft/s with rollator and CGA ?Goal status: INITIAL ?  ?4.  Pt will improve Berg score to 50/56 for decreased fall risk ? ?Baseline: 35/56 ?Goal status: INITIAL ?  ?  ?ASSESSMENT: ?  ?CLINICAL IMPRESSION: ?Emphasis of skilled PT session on assessing balance and establishing initial HEP for L NMR and improved endurance. Pt scored a 35/56 on Berg, indicative of high fall risk. Pt verbalized understanding of fall risk and was able to teach back safe setup of HEP at home. Pt limited by poor endurance and L homonymous hemianopsia, requiring mod-max directional cues to avoid running into obstacles and scan L side during gait. Continue POC.  ?  ?  ?OBJECTIVE IMPAIRMENTS Abnormal gait, cardiopulmonary status limiting activity, decreased activity tolerance, decreased balance, decreased cognition, decreased coordination, decreased endurance,  decreased knowledge of condition, difficulty walking, decreased strength, decreased safety awareness, impaired perceived functional ability, and impaired vision/preception.  ?  ?ACTIVITY LIMITATIONS cleaning, driving, meal prep, laundry, medication management, yard work, and Dentist.  ?  ?PERSONAL FACTORS Age, Fitness, and 1 comorbidity: orthostatic hypotension  are also affecting patient's functional outcome.  ?  ?  ?REHAB POTENTIAL: Good ?  ?CLINICAL DECISION MAKING: Stable/uncomplicated ?  ?EVALUATION COMPLEXITY: Low ?  ?PLAN: ?PT FREQUENCY: 2x/week ?  ?PT DURATION: 12 weeks ?  ?PLANNED INTERVENTIONS: Therapeutic exercises, Therapeutic activity, Neuromuscular re-education, Balance training, Gait training, Patient/Family education, Joint mobilization, Stair training, Visual/preceptual remediation/compensation, and Cognitive remediation ?  ?PLAN FOR NEXT SESSION: How is HEP? Scifit for endurance, coordination tasks for LLE, scanning tasks on L side, BLE strength *MONITOR BP*  ? ? ? ?Cruzita Lederer Modell Fendrick, PT, DPT ?08/13/2021, 5:00 PM ? ?   ?

## 2021-08-14 ENCOUNTER — Ambulatory Visit: Payer: Medicare Other | Admitting: Physical Therapy

## 2021-08-14 ENCOUNTER — Ambulatory Visit: Payer: Medicare Other | Admitting: Speech Pathology

## 2021-08-14 ENCOUNTER — Encounter: Payer: Medicare Other | Admitting: Speech Pathology

## 2021-08-14 ENCOUNTER — Encounter: Payer: Medicare Other | Admitting: Occupational Therapy

## 2021-08-14 ENCOUNTER — Ambulatory Visit: Payer: Medicare Other

## 2021-08-14 ENCOUNTER — Encounter: Payer: Self-pay | Admitting: Physical Therapy

## 2021-08-14 DIAGNOSIS — M6281 Muscle weakness (generalized): Secondary | ICD-10-CM

## 2021-08-14 DIAGNOSIS — R2689 Other abnormalities of gait and mobility: Secondary | ICD-10-CM

## 2021-08-14 DIAGNOSIS — R2681 Unsteadiness on feet: Secondary | ICD-10-CM

## 2021-08-14 DIAGNOSIS — R41841 Cognitive communication deficit: Secondary | ICD-10-CM

## 2021-08-14 NOTE — Therapy (Signed)
?OUTPATIENT SPEECH LANGUAGE PATHOLOGY TREATMENT NOTE ? ? ?Patient Name: Lauren Conner ?MRN: [id number] ?DOB:07/10/1940, 81 y.o., female ?Today's Date: 08/14/2021 ? ?PCP: [facility] ?REFERRING PROVIDER: Cathlyn Parsons, PA-C  ? ? End of Session - 08/14/21 1529   ? ? Visit Number 4   ? Number of Visits 17   ? Date for SLP Re-Evaluation 10/29/21   ? Authorization Type Medicare   ? Progress Note Due on Visit 10   ? SLP Start Time 1530   ? SLP Stop Time  1610   ? SLP Time Calculation (min) 40 min   ? Activity Tolerance Patient tolerated treatment well   ? ?  ?  ? ?  ? ? ? ?No past medical history on file. ?No past surgical history on file. ?Patient Active Problem List  ? Diagnosis Date Noted  ? 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 Bronchiectasis with acute exacerbation ([place])   ? Encephalopathy acute 07/21/2021  ? Intracerebral hemorrhage 07/21/2021  ? Acute renal failure ([place])   ? Acute urinary retention 07/20/2021  ? Bilateral hydronephrosis 07/20/2021  ? Osteoporosis without current pathological fracture 07/20/2021  ? CKD (chronic kidney disease) stage 4, GFR 15-29 ml/min (HCC) 07/19/2021  ? Paroxysmal SVT (supraventricular tachycardia) ([place]) 07/19/2021  ? Severe sepsis ([place]) 07/19/2021  ? Pneumonia of both lungs due to infectious organism 07/19/2021  ? Acute metabolic encephalopathy 41/32/4401  ? Anemia of chronic kidney failure, stage 4 (severe) ([place]) 07/19/2021  ? Metabolic acidosis 02/72/5366  ? Hyponatremia 07/19/2021  ? Essential hypertension 11/17/2018  ? Hypothyroidism (acquired) 12/06/2015  ? Adult bronchiectasis ([place]) 03/25/2011  ? ? ?ONSET DATE: 07/19/2021 ? ?REFERRING DIAG: I61.9 (ICD-10-CM) - Intraparenchymal hemorrhage of brain ([place])  ? ?THERAPY DIAG:  ?Cognitive communication deficit ? ?SUBJECTIVE: "I went to the doctor yesterday. My levels for my kidneys were good." ? ?PAIN:  ?Are you having pain? No ? ?OBJECTIVE:  ? ?TODAY'S TREATMENT: ?08-14-21: Target external  compensations for memory and orientation. Generated strategies with patient on best way she can begin to manage her schedule. Pt reports residual blurriness in L eye (is planning to make appt with opthalmology) and thus will plan to obtain large desk calendar on which she can write in all of her appointments. ST advises pt create a routine of referring to calendar daily to orient to date, schedule for the day/week, and also use side bar of calendar to keep running to do list of important things pt needs to accomplish which are not a part of daily routine. Plan to target medication management next session, if able.  ? ?08-12-21: Education provided on internal and external memory strategies. Pt generates x1 situation for each strategy presented on how/when she could implement usage. Provide education on consideration for using internal vs. external strategies (complexity, quantity, etc.). Administered cognitive fx PROM- Pt self scores 89.  ?  ?  ?PATIENT EDUCATION: ?Education details: see above ?Person educated: Patient and Child(ren) ?Education method: Explanation, Demonstration, and Handouts ?Education comprehension: verbalized understanding, returned demonstration, and needs further education ?  ?  ?  ?  ?GOALS: ?Goals reviewed with patient? Yes ?  ?SHORT TERM GOALS: Target date: 09/03/2021 ?  ?Pt will complete CLQT and PROM within first 2 sessions. ?Baseline: initiated ?Goal status: ONGOING ?  ?2.  Pt will accurately sort medications into weekly pill box with occasional min A over 2 sessions.  ?Baseline: medications being managed by care partner ?Goal status: ONGOING ?  ?3.  Pt will  report successful implementation at home of x2 attention/memory compensations with occasional mod A over 2 sessions.  ?Baseline: reports difficulty focusing; impaired memory observed in session ?Goal status: ONGOING ?  ?4.  Pt will demonstrate adeuqate organizational skills to complete mod complex therapy tasks with usual mod A over 2  sessions  ?Baseline: attention, ex fx skills inhibiting successful completion of tasks ?Goal status: ONGOING ?  ?5.  Pt will report increased household participation in x2 desired activities (shopping, meal prep, medication/financial management) with supervision from family members   ?Baseline: requires total A for ADLs and IADLS ?Goal status: ONGOING ?  ?  ?LONG TERM GOALS: Target date: 10/29/2021 ?  ?Pt will successfully manage household activities using external aids (to-do list, schedule, etc) with rare min A over 1 weeks  ?Baseline: total A for all ADLs ?Goal status: ONGOING ?  ?2.  Pt will successfully implement a bill paying system with rare min A over 1 week period to promote independence with managing finances ?Baseline: requires total A ?Goal status: ONGOING ?  ?3.  Pt will demonstrate adeuqate organizational skills to complete mod complex therapy tasks with modified independence (compensations) in 2 sessions  ?Baseline: attention, ex fx skills inhibiting successful completion of tasks ?Goal status: ONGOING ? ?4. Pt will demonstrate 5 pt improvement on Cognitive Fx - Abilities PROM by d/c.  ?Baseline: 89 ?Goal Status: NEW GOAL ?  ?  ?ASSESSMENT: ?  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 Pt presents with impairments in domains of executive function, attention, memory, and problem solving. Pt reports difficulty with simple every day tasks such as using remote or cell phone. Demonstrates intellectual and emergent awareness which causes frustration for pt. Demonstrates poor frustration tolerance but is able to reengage given encouragement from Lauren Conner and son in session. Requires total A for ADLs and IADLs at this time, prior to acute event pt was fully independent. Pt desires to return to living independently. Pt stated goals are to return to managing finances, managing medications, taking care of home to include household  chores, and meal planning and grocery shopping for self. ?  ?OBJECTIVE IMPAIRMENTS include attention, memory, and executive functioning. These impairments are limiting patient from managing medications, managing appointments, managing finances, household responsibilities, and ADLs/IADLs. ?Patient will benefit from skilled SLP services to address above impairments and improve overall function. ?  ?REHAB POTENTIAL: Good ?  ?PLAN: ?SLP FREQUENCY: 2x/week ?  ?SLP DURATION: 12 weeks ?  ?PLANNED INTERVENTIONS: Environmental controls, Cueing hierachy, Cognitive reorganization, Internal/external aids, Functional tasks, SLP instruction and feedback, and Compensatory strategies ?  ?  ?  ?Su Monks, CF-SLP ?08/14/2021, 3:32 PM ?  ?

## 2021-08-14 NOTE — Patient Instructions (Signed)
Consider getting a desk calendar ? ?Fill in all appointments for month of April  ? ?Use the side to write a list a things you want to accomplish  ?Opthalmology appointment ? ?Create a new habit of checking your calendar every morning to know what's on the schedule for that day ?

## 2021-08-14 NOTE — Therapy (Signed)
?OUTPATIENT PHYSICAL THERAPY TREATMENT NOTE ? ? ?Patient Name: Lauren Conner ?MRN: [id number] ?DOB:Oct 08, 1940, 81 y.o., female ?Today's Date: 08/14/2021 ? ?PCP: [facility] ?REFERRING PROVIDER: Cathlyn Parsons, PA-C  ? ? PT End of Session - 08/14/21 1457   ? ? Visit Number 3   ? Number of Visits 17   Plus eval  ? Date for PT Re-Evaluation 10/29/21   ? Authorization Type Medicare A & B   ? Progress Note Due on Visit 10   ? PT Start Time 4481   pt late  ? PT Stop Time 8563   ? PT Time Calculation (min) 39 min   ? Equipment Utilized During Treatment Gait belt;Other (comment)   Ted hose, abdominal binder  ? Activity Tolerance Patient tolerated treatment well   ? Behavior During Therapy [facility] for tasks assessed/performed   ? ?  ?  ? ?  ? ? ? ?History reviewed. No pertinent past medical history. ?History reviewed. No pertinent surgical history. ?Patient Active Problem List  ? Diagnosis Date Noted  ? 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 Bronchiectasis with acute exacerbation ([place])   ? Encephalopathy acute 07/21/2021  ? Intracerebral hemorrhage 07/21/2021  ? Acute renal failure ([place])   ? Acute urinary retention 07/20/2021  ? Bilateral hydronephrosis 07/20/2021  ? Osteoporosis without current pathological fracture 07/20/2021  ? CKD (chronic kidney disease) stage 4, GFR 15-29 ml/min (HCC) 07/19/2021  ? Paroxysmal SVT (supraventricular tachycardia) ([place]) 07/19/2021  ? Severe sepsis ([place]) 07/19/2021  ? Pneumonia of both lungs due to infectious organism 07/19/2021  ? Acute metabolic encephalopathy 14/97/0263  ? Anemia of chronic kidney failure, stage 4 (severe) ([place]) 07/19/2021  ? Metabolic acidosis 78/58/8502  ? Hyponatremia 07/19/2021  ? Essential hypertension 11/17/2018  ? Hypothyroidism (acquired) 12/06/2015  ? Adult bronchiectasis ([place]) 03/25/2011  ? ? ?REFERRING DIAG: I61.9 (ICD-10-CM) - Intraparenchymal hemorrhage of brain ([place])  ? ?THERAPY DIAG:  ?Unsteadiness on feet ? ?Muscle weakness  (generalized) ? ?Other abnormalities of gait and mobility ? ?PERTINENT HISTORY: anemia of chronic disease, paroxysmal SVT hypothyroidism hypertension CKD stage IV  ? ?PRECAUTIONS: Fall and orthostatic. L homonymous hemianopsia  ? ?SUBJECTIVE: Pt states she feels less nervous taking her baths.  She does not feel anything has gotten worse from prior visit.  She has been busy with appts and has not had a chance to do HEP, requests to review during session. ? ?PAIN:  ?Are you having pain? No ?TODAY'S TREATMENT: ?Reviewed current HEP per pt request.  See HEP below. ?Vitals taken in LUE while seated following initial activity of STS 150/80 HR 92bpm; PT assessed abdominal binder on properly w/ ted hose donned throughout session. ?Regressed session to sitting due to inc pt fatigue.  Pt sitting on air disc w/ 1kg weighted ball performing forward reach cued for core engagement to return to upright sitting x10, progressed activity to D1/D2 x6 each side; pt became briefly confused stating vision was fuzzy, reassessed BP, dropped to 138/70.  PT provided quiet rest in sitting, water, and reassessed vitals in sitting, BP 136/80 after 4 mins.  Confusion and vision cleared prior to escorting pt to front.   ? ?OBJECTIVE:  ?  ?DIAGNOSTIC FINDINGS: Cranial CT scan showed large area of acute intraparenchymal hemorrhage within the right temporoparietal lobe 4.0 x 4.0 x 4.1 cm with surrounding vasogenic edema with mild effacement of the occipital horn and atrium of the right lateral ventricle.  Small amount of subarachnoid hemorrhage within [place] at the  anterior right temporal lobe.  3 mm right to left midline shift.  ?  ?COGNITION: ?Overall cognitive status: Impaired: Memory: Deficits impaired STM ?Awareness: Deficits impaired safety awareness ?Problem solving: Deficits poor executive reasoning  ?Behavior: Impulsive  ?  ?PATIENT EDUCATION: ?Education details: Reviewed safety w/ HEP. ?Person educated: Patient  ?Education method:  Explanation, Demonstration, and Verbal cues ?Education comprehension: verbalized understanding and needs further education ?  ?HOME EXERCISE PROGRAM: ?Access Code: [email or web address] ?URL: [email or web address] ?Date: 08/08/2021 ?Prepared by: Mickie Bail Plaster ? ?Exercises ?- Sit to Stand with Resistance Around Legs  - 1 x daily - 7 x weekly - 3 sets - 10 reps ?- Heel to toe walking   - 1 x daily - 7 x weekly - 3 sets - 10 reps ?- Side Stepping with Resistance at Thighs and Counter Support  - 1 x daily - 7 x weekly - 3 sets - 10 reps ?- Standing March with Counter Support  - 1 x daily - 7 x weekly - 3 sets - 10 reps - 2 second hold ?  ?GOALS: ?Goals reviewed with patient? Yes ?  ?SHORT TERM GOALS: Target date: 09/03/2021 ?  ?Pt will perform initial HEP with S* for improved strength, balance, transfers and gait. ?  ?Baseline:  ?Goal status: INITIAL ?  ?2.  Pt will improve gait velocity to at least 2.3 ft/s with LRAD and S* for improved gait efficiency and functional mobility ?Baseline: 1.77 ft/s with rollator w/CGA ?Goal status: INITIAL ?  ?3.  Pt will improve Berg score to 42/56 for decreased fall risk ? ?Baseline: 35/56 ?Goal status: INITIAL ?  ?4.  Pt will improve 30s STS to 11 repetitions or greater without BUE support to demonstrate improved functional strength and transfer efficiency.   ?Baseline: 8 reps without BUE support  ?Goal status: INITIAL ?  ?  ?LONG TERM GOALS: Target date: 10/01/2021 ?  ?Pt will ascend/descend 4 steps using step-to pattern and LRAD mod I to imitate home environment  ?Baseline:  ?Goal status: INITIAL ?  ?2.  Pt will improve normal TUG to less than or equal to 15 seconds w/LRAD mod I for improved functional mobility and decreased fall risk. ?  ?Baseline: 22.17s w/rollator and CGA ?Goal status: INITIAL ?  ?3.  Pt will improve gait velocity to at least 2.7 ft/s with LRAD mod I for improved gait efficiency and performance at community ambulator level  ?  ?Baseline: 1.77 ft/s with rollator  and CGA ?Goal status: INITIAL ?  ?4.  Pt will improve Berg score to 50/56 for decreased fall risk ? ?Baseline: 35/56 ?Goal status: INITIAL ?  ?  ?ASSESSMENT: ?  ?CLINICAL IMPRESSION: ?Reviewed HEP with focus on safety and technique per pt request.  Pt severely limited by fatigue during session requiring frequent and prolonged rest breaks.  Briefly following seated core activity pt became confused stating vision became blurry.  PT spent time monitoring pt who demonstrated improved vitals and reported symptoms prior to leaving session.  Continue per POC with careful monitoring of BP.  ?  ?  ?OBJECTIVE IMPAIRMENTS Abnormal gait, cardiopulmonary status limiting activity, decreased activity tolerance, decreased balance, decreased cognition, decreased coordination, decreased endurance, decreased knowledge of condition, difficulty walking, decreased strength, decreased safety awareness, impaired perceived functional ability, and impaired vision/preception.  ?  ?ACTIVITY LIMITATIONS cleaning, driving, meal prep, laundry, medication management, yard work, and Dentist.  ?  ?PERSONAL FACTORS Age, Fitness, and 1 comorbidity: orthostatic hypotension  are also affecting patient's functional outcome.  ?  ?  ?  REHAB POTENTIAL: Good ?  ?CLINICAL DECISION MAKING: Stable/uncomplicated ?  ?EVALUATION COMPLEXITY: Low ?  ?PLAN: ?PT FREQUENCY: 2x/week ?  ?PT DURATION: 12 weeks ?  ?PLANNED INTERVENTIONS: Therapeutic exercises, Therapeutic activity, Neuromuscular re-education, Balance training, Gait training, Patient/Family education, Joint mobilization, Stair training, Visual/preceptual remediation/compensation, and Cognitive remediation ?  ?PLAN FOR NEXT SESSION: How is HEP? Scifit for endurance, coordination tasks for LLE, scanning tasks on L side, BLE strength *MONITOR BP* standing tolerance-cones low<>high w/ L/R discrimination task ? ? ? ?Bary Richard, PT, DPT ?08/14/2021, 4:56 PM ? ?   ?

## 2021-08-18 ENCOUNTER — Encounter: Payer: Medicare Other | Admitting: Speech Pathology

## 2021-08-18 ENCOUNTER — Ambulatory Visit: Payer: Medicare Other

## 2021-08-19 ENCOUNTER — Encounter: Payer: Self-pay | Admitting: Physical Therapy

## 2021-08-19 ENCOUNTER — Ambulatory Visit: Payer: Medicare Other

## 2021-08-19 ENCOUNTER — Ambulatory Visit: Payer: Medicare Other | Attending: Physician Assistant | Admitting: Occupational Therapy

## 2021-08-19 ENCOUNTER — Ambulatory Visit: Payer: Medicare Other | Admitting: Physical Therapy

## 2021-08-19 DIAGNOSIS — R2689 Other abnormalities of gait and mobility: Secondary | ICD-10-CM | POA: Diagnosis present

## 2021-08-19 DIAGNOSIS — R41841 Cognitive communication deficit: Secondary | ICD-10-CM | POA: Insufficient documentation

## 2021-08-19 DIAGNOSIS — R2681 Unsteadiness on feet: Secondary | ICD-10-CM | POA: Insufficient documentation

## 2021-08-19 DIAGNOSIS — M6281 Muscle weakness (generalized): Secondary | ICD-10-CM

## 2021-08-19 DIAGNOSIS — H53462 Homonymous bilateral field defects, left side: Secondary | ICD-10-CM | POA: Insufficient documentation

## 2021-08-19 DIAGNOSIS — I69318 Other symptoms and signs involving cognitive functions following cerebral infarction: Secondary | ICD-10-CM | POA: Diagnosis present

## 2021-08-19 NOTE — Therapy (Signed)
?OUTPATIENT SPEECH LANGUAGE PATHOLOGY TREATMENT NOTE ? ? ?Patient Name: Lauren Conner ?MRN: [id number] ?DOB:Oct 03, 1940, 81 y.o., female ?Today's Date: 08/19/2021 ? ?PCP: [facility] ?REFERRING PROVIDER: Cathlyn Parsons, PA-C  ? ? End of Session - 08/19/21 1243   ? ? Visit Number 5   ? Number of Visits 17   ? Date for SLP Re-Evaluation 10/29/21   ? Authorization Type Medicare   ? SLP Start Time 1102   ? SLP Stop Time  1145   ? SLP Time Calculation (min) 43 min   ? Activity Tolerance Patient tolerated treatment well   ? ?  ?  ? ?  ? ? ? ? ?History reviewed. No pertinent past medical history. ?History reviewed. No pertinent surgical history. ?Patient Active Problem List  ? Diagnosis Date Noted  ? 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 Bronchiectasis with acute exacerbation ([place])   ? Encephalopathy acute 07/21/2021  ? Intracerebral hemorrhage 07/21/2021  ? Acute renal failure ([place])   ? Acute urinary retention 07/20/2021  ? Bilateral hydronephrosis 07/20/2021  ? Osteoporosis without current pathological fracture 07/20/2021  ? CKD (chronic kidney disease) stage 4, GFR 15-29 ml/min (HCC) 07/19/2021  ? Paroxysmal SVT (supraventricular tachycardia) ([place]) 07/19/2021  ? Severe sepsis ([place]) 07/19/2021  ? Pneumonia of both lungs due to infectious organism 07/19/2021  ? Acute metabolic encephalopathy 79/48/0165  ? Anemia of chronic kidney failure, stage 4 (severe) ([place]) 07/19/2021  ? Metabolic acidosis 53/74/8270  ? Hyponatremia 07/19/2021  ? Essential hypertension 11/17/2018  ? Hypothyroidism (acquired) 12/06/2015  ? Adult bronchiectasis ([place]) 03/25/2011  ? ? ?ONSET DATE: 07/19/2021 ? ?REFERRING DIAG: I61.9 (ICD-10-CM) - Intraparenchymal hemorrhage of brain ([place])  ? ?THERAPY DIAG:  ?Cognitive communication deficit ? ?SUBJECTIVE: "My medications have been a challenge"  ? ?PAIN:  ?Are you having pain? No ? ?OBJECTIVE:  ? ?TODAY'S TREATMENT: ?08-18-21: Pt returned with medication bottles and pill box for  pill box sorting task. Pill box already filled out for the week. Sunday AM and noon medications still present in pill box but pt unable to recall why they were still present as she recalled taking them. Daughter-in-law is currently organizing pill box and providing medications at designated times. SLP targeted medication sorting task to plan where each medication would be sorted based on each individual pill bottle. Occasional fading to rare min A required to recall specific medication instructions and identify correct placement. Pt benefited from visually describing medication to aid recall and placement. SLP instructed double checking after each medication to ensure accuracy. In subsequent medication task to identify errors within pre-sorted pill box, pt required intermittent mod fading to min A to ID errors for 2 medications. Pt benefited from cues to focus on one medication a time and visual cues to reorient to instructions. Pt continues to use calendar at home to write down appointments, which has reportedly been successful.  ? ?08-14-21: Target external compensations for memory and orientation. Generated strategies with patient on best way she can begin to manage her schedule. Pt reports residual blurriness in L eye (is planning to make appt with opthalmology) and thus will plan to obtain large desk calendar on which she can write in all of her appointments. ST advises pt create a routine of referring to calendar daily to orient to date, schedule for the day/week, and also use side bar of calendar to keep running to do list of important things pt needs to accomplish which are not a part of daily routine.  Plan to target medication management next session, if able.  ? ?08-12-21: Education provided on internal and external memory strategies. Pt generates x1 situation for each strategy presented on how/when she could implement usage. Provide education on consideration for using internal vs. external strategies  (complexity, quantity, etc.). Administered cognitive fx PROM- Pt self scores 89.  ?  ?  ?PATIENT EDUCATION: ?Education details: see above ?Person educated: Patient  ?Education method: Explanation, Demonstration, and Handout ?Education comprehension: verbalized understanding, returned demonstration, and needs further education ?  ?  ?  ?  ?GOALS: ?Goals reviewed with patient? Yes ?  ?SHORT TERM GOALS: Target date: 09/03/2021 ?  ?Pt will complete CLQT and PROM within first 2 sessions. ?Baseline: initiated ?Goal status: ONGOING ?  ?2.  Pt will accurately sort medications into weekly pill box with occasional min A over 2 sessions.  ?Baseline: medications being managed by care partner ?Goal status: ONGOING ?  ?3.  Pt will report successful implementation at home of x2 attention/memory compensations with occasional mod A over 2 sessions.  ?Baseline: reports difficulty focusing; impaired memory observed in session ?Goal status: ONGOING ?  ?4.  Pt will demonstrate adeuqate organizational skills to complete mod complex therapy tasks with usual mod A over 2 sessions  ?Baseline: attention, ex fx skills inhibiting successful completion of tasks ?Goal status: ONGOING ?  ?5.  Pt will report increased household participation in x2 desired activities (shopping, meal prep, medication/financial management) with supervision from family members   ?Baseline: requires total A for ADLs and IADLS ?Goal status: ONGOING ?  ?  ?LONG TERM GOALS: Target date: 10/29/2021 ?  ?Pt will successfully manage household activities using external aids (to-do list, schedule, etc) with rare min A over 1 weeks  ?Baseline: total A for all ADLs ?Goal status: ONGOING ?  ?2.  Pt will successfully implement a bill paying system with rare min A over 1 week period to promote independence with managing finances ?Baseline: requires total A ?Goal status: ONGOING ?  ?3.  Pt will demonstrate adeuqate organizational skills to complete mod complex therapy tasks with  modified independence (compensations) in 2 sessions  ?Baseline: attention, ex fx skills inhibiting successful completion of tasks ?Goal status: ONGOING ? ?4. Pt will demonstrate 5 pt improvement on Cognitive Fx - Abilities PROM by d/c.  ?Baseline: 89 ?Goal Status: ONGOING ?  ?  ?ASSESSMENT: ?  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 Pt presents with impairments in domains of executive function, attention, memory, and problem solving. Pt reports difficulty with simple every day tasks such as using remote or cell phone. Demonstrates intellectual and emergent awareness which causes frustration for pt. Demonstrates poor frustration tolerance but is able to reengage given encouragement from ST in session. Requires total A for ADLs and IADLs at this time, prior to acute event pt was fully independent. Pt desires to return to living independently. Pt stated goals are to return to managing finances, managing medications, taking care of home to include household chores, and meal planning and grocery shopping for self. ?  ?OBJECTIVE IMPAIRMENTS include attention, memory, and executive functioning. These impairments are limiting patient from managing medications, managing appointments, managing finances, household responsibilities, and ADLs/IADLs. ?Patient will benefit from skilled SLP services to address above impairments and improve overall function. ?  ?REHAB POTENTIAL: Good ?  ?PLAN: ?SLP FREQUENCY: 2x/week ?  ?SLP DURATION: 12 weeks ?  ?PLANNED INTERVENTIONS: Environmental controls, Cueing hierachy, Cognitive reorganization, Internal/external aids,  Functional tasks, SLP instruction and feedback, and Compensatory strategies ?  ?  ?  ?Marzetta Board, CCC-SLP ?08/19/2021, 12:44 PM ?  ?

## 2021-08-19 NOTE — Therapy (Signed)
?OUTPATIENT OCCUPATIONAL THERAPY TREATMENT NOTE ? ? ?Patient Name: Lauren Conner ?MRN: [id number] ?DOB:1940/09/14, 81 y.o., female ?Today's Date: 08/08/2021 ? ?[facility]P: [facility] ?REFERRING PROVIDER: Dr. Naaman Conner ? ? OT End of Session - 08/19/21 1019   ? ? Visit Number 4   ? Number of Visits 17   ? Date for OT Re-Evaluation 10/01/21   ? Authorization Type MCR, [employer]   ? Authorization - Visit Number 4   ? Authorization - Number of Visits 10   ? Progress Note Due on Visit 10   ? OT Start Time 1020   ? OT Stop Time 1100   ? OT Time Calculation (min) 40 min   ? Activity Tolerance Patient tolerated treatment well   ? Behavior During Therapy [facility] for tasks assessed/performed   ? ?  ?  ? ?  ? ? ? ?  ? ? ? ?Patient Active Problem List  ? Diagnosis Date Noted  ? 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 Bronchiectasis with acute exacerbation ([place])   ? Encephalopathy acute 07/21/2021  ? Intracerebral hemorrhage 07/21/2021  ? Acute renal failure ([place])   ? Acute urinary retention 07/20/2021  ? Bilateral hydronephrosis 07/20/2021  ? Osteoporosis without current pathological fracture 07/20/2021  ? CKD (chronic kidney disease) stage 4, GFR 15-29 ml/min (HCC) 07/19/2021  ? Paroxysmal SVT (supraventricular tachycardia) ([place]) 07/19/2021  ? Severe sepsis ([place]) 07/19/2021  ? Pneumonia of both lungs due to infectious organism 07/19/2021  ? Acute metabolic encephalopathy 75/64/3329  ? Anemia of chronic kidney failure, stage 4 (severe) ([place]) 07/19/2021  ? Metabolic acidosis 51/88/4166  ? Hyponatremia 07/19/2021  ? Essential hypertension 11/17/2018  ? Hypothyroidism (acquired) 12/06/2015  ? Adult bronchiectasis ([place]) 03/25/2011  ? ? ?ONSET DATE: 07/19/21 ? ?REFERRING DIAG: I61.9 (ICD-10-CM) - Intraparenchymal hemorrhage of brain ([place])  ? ?THERAPY DIAG:  ?Left homonymous hemianopsia -  ?Unsteadiness on feet - ?Muscle weakness (generalized) -  ?Other symptoms and signs involving cognitive functions following cerebral  infarction  ? ?SUBJECTIVE:  ? ?SUBJECTIVE STATEMENT: ?I think I'm doing better ? ? ?PERTINENT HISTORY: Lauren Conner is a 81 y.o. right-handed female with history of anemia of chronic disease, paroxysmal SVT hypothyroidism hypertension CKD stage IV followed by Dr.Lehrich with nephrology at [facility] with creatinine baseline 2.0.  ? ?PRECAUTIONS: Fall and Other: no driving, abdominal binder d/t orthostatic hypotension, foley catheter, and Lt visual field cut ? ?WEIGHT BEARING RESTRICTIONS No ? ? ?SUBJECTIVE: Pt denies pain ? ?PAIN:  ?Are you having pain? No ? ? ? ? ?OBJECTIVE:  ? ?TODAY'S TREATMENT: ? ?Reviewed visual scanning strategies and activities to encourage visual scanning (handout issued in a previous session)  ? ?12 pc puzzle w/ mod cueing for developing strategies, finding corner pcs and which corner to go to, orienting pieces, choosing correct pc.  ?2nd 12 pc puzzle with mod cueing initially, but did remainder w/ min cueing ? ? ?PATIENT EDUCATION: ?Education details: memory strategies ?Person educated: Patient ?Education method: Explanation, Verbal cues, and Handouts ?Education comprehension: verbalized understanding and needs further education ? ? ? ? ?GOALS: ? ? ?SHORT TERM GOALS: Target date: 09/03/2021 ? ?Pt will be independent with HEP targeting overall endurance ? ?Baseline: n/a ?Goal status: INITIAL ? ?2.  Pt to verbalize understanding with visual scanning strategies ?Baseline: dep ?Goal status: ONGOING (handout issued 08/08/21) ? ?3.  Pt will perform basic snack prep w/ supervision  ?Baseline: dep ?Goal status: INITIAL ? ?4.  Pt to perform tabletop scanning  attending to Lt side 75% or greater ?Baseline: dep ?Goal status: ONGOING ? ?5.  Pt to verbalize understanding with memory compensatory strategies ?Baseline: dep ?Goal status: INITIAL ? ? ? ?LONG TERM GOALS: Target date: 10/01/2021 ? ?Pt will perform simple stovetop cooking task and light housekeeping at mod I and good safety  awareness ?Baseline: dep ?Goal status: INITIAL ? ?2.  Pt will perform environmental scanning with 85% accuracy in a min distracting environment.   ?Baseline: dep ?Goal status: INITIAL ? ?3.  Pt to perform BADLS I'ly  ?Baseline: min assist for shower transfers and dressing ?Goal status: INITIAL ? ?4.  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 ?5.  Pt to perform dynamic standing tasks safely w/o LOB ?Baseline: unsteadiness ?Goal status: INITIAL ? ? ?ASSESSMENT: ? ?CLINICAL IMPRESSION: ?Pt progressing with improving visual scanning to the Lt but still requires cues.  However, pt with difficulty with with cognitive components of scanning today for puzzle.   ? ?PERFORMANCE DEFICITS in functional skills including ADLs, IADLs, sensation, strength, FMC, mobility, balance, endurance, decreased knowledge of precautions, decreased knowledge of use of DME, vision, and UE functional use, cognitive skills including attention, memory, perception, problem solving, safety awareness, and sequencing,  ? ?IMPAIRMENTS are limiting patient from ADLs, IADLs, and social participation.  ? ?COMORBIDITIES may have co-morbidities  that affects occupational performance. Patient will benefit from skilled OT to address above impairments and improve overall function. ? ?MODIFICATION OR ASSISTANCE TO COMPLETE EVALUATION: Min-Moderate modification of tasks or assist with assess necessary to complete an evaluation. ? ?OT OCCUPATIONAL PROFILE AND HISTORY: Detailed assessment: Review of records and additional review of physical, cognitive, psychosocial history related to current functional performance. ? ?CLINICAL DECISION MAKING: Moderate - several treatment options, min-mod task modification necessary ? ?REHAB POTENTIAL: Good ? ?EVALUATION COMPLEXITY: Moderate ? ? ? ?PLAN: ?OT FREQUENCY: 2x/week ? ?OT DURATION: 8 weeks ? ?PLANNED  INTERVENTIONS: self care/ADL training, therapeutic exercise, therapeutic activity, neuromuscular re-education, manual therapy, functional mobility training, patient/family education, cognitive remediation/compensation, visual/perceptual remediation/compensation, coping strategies training, and DME and/or AE instructions ? ?RECOMMENDED OTHER SERVICES: n/a ? ?CONSULTED AND AGREED WITH PLAN OF CARE: Patient  ? ?PLAN FOR NEXT SESSION: simple environmental scanning (monitor BP),  ? ? ?Redmond Baseman, OTR/L ?08/19/21 11:00 AM ?Phone [phone] ?FAX [phone] ? ? ?

## 2021-08-19 NOTE — Patient Instructions (Signed)

## 2021-08-19 NOTE — Therapy (Signed)
?OUTPATIENT PHYSICAL THERAPY TREATMENT NOTE ? ? ?Patient Name: Lauren Conner ?MRN: [id number] ?DOB:07/10/1940, 81 y.o., female ?Today's Date: 08/19/2021 ? ?[facility]P: [facility] ?REFERRING PROVIDER: Cathlyn Parsons, PA-C  ? ? PT End of Session - 08/19/21 0936   ? ? Visit Number 4   ? Number of Visits 17   Plus eval  ? Date for PT Re-Evaluation 10/29/21   ? Authorization Type Medicare A & B   ? Progress Note Due on Visit 10   ? PT Start Time 0737   ? PT Stop Time 1016   ? PT Time Calculation (min) 44 min   ? Equipment Utilized During Treatment Gait belt;Other (comment)   Ted hose, abdominal binder  ? Activity Tolerance Patient tolerated treatment well   ? Behavior During Therapy [facility] for tasks assessed/performed   ? ?  ?  ? ?  ? ? ? ?History reviewed. No pertinent past medical history. ?History reviewed. No pertinent surgical history. ?Patient Active Problem List  ? Diagnosis Date Noted  ? 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 Bronchiectasis with acute exacerbation ([place])   ? Encephalopathy acute 07/21/2021  ? Intracerebral hemorrhage 07/21/2021  ? Acute renal failure ([place])   ? Acute urinary retention 07/20/2021  ? Bilateral hydronephrosis 07/20/2021  ? Osteoporosis without current pathological fracture 07/20/2021  ? CKD (chronic kidney disease) stage 4, GFR 15-29 ml/min (HCC) 07/19/2021  ? Paroxysmal SVT (supraventricular tachycardia) ([place]) 07/19/2021  ? Severe sepsis ([place]) 07/19/2021  ? Pneumonia of both lungs due to infectious organism 07/19/2021  ? Acute metabolic encephalopathy 10/62/6948  ? Anemia of chronic kidney failure, stage 4 (severe) ([place]) 07/19/2021  ? Metabolic acidosis 54/62/7035  ? Hyponatremia 07/19/2021  ? Essential hypertension 11/17/2018  ? Hypothyroidism (acquired) 12/06/2015  ? Adult bronchiectasis ([place]) 03/25/2011  ? ? ?REFERRING DIAG: I61.9 (ICD-10-CM) - Intraparenchymal hemorrhage of brain ([place])  ? ?THERAPY DIAG:  ?Unsteadiness on feet ? ?Muscle weakness  (generalized) ? ?Other abnormalities of gait and mobility ? ?PERTINENT HISTORY: anemia of chronic disease, paroxysmal SVT hypothyroidism hypertension CKD stage IV  ? ?PRECAUTIONS: Fall and orthostatic. L homonymous hemianopsia  ? ?SUBJECTIVE: No changes, no falls since last visit.  HEP is going okay, she wants to do more. ? ?PAIN:  ?Are you having pain? No ?TODAY'S TREATMENT: ? ?Vitals taken in LUE while seated prior to activity 143/83 HR 92bpm; PT assessed abdominal binder on properly w/ ted hose donned throughout session. ?SciFit for endurance and dynamic warmup performed using BUE/BLE x56mins, BLE x73mins L2.3.  Pt reported modified RPE of 7/10. ? Mini Squats 2x8, first set using UE support to assist in safety with sequencing, tactile cues for forward lean and appropriate depth of squat. ?PT laid out multi-colored dots in path formation using CGA and hand held assist for pt to step to each dot 8' x4>side stepping to wide dots 8'x4>stepping to dots to and across midline w/ therapist facilitating trunk rotation left to allow pt to visually scan for third dot ?Using multi-colored cones sat on low table to side of patient with pt retrieving cones laterally and placing to high shelf on opp side, 9 cones used w/ pt retrieving from L and R sides to facilitate visual scanning ?BP LUE in seated end of session (taken due to pt flat affect):  132/82, HR 114bpm, reassessed 2 mins later after 4oz water intake:  126/80 HR 102, abdominal binder assessed with proper fit, pt asymptomatic, reports tiredness.  Reassessed 2 mins later:  128/82 HR 105bpm. ? ?OBJECTIVE:  ?  ?DIAGNOSTIC FINDINGS: Cranial CT scan showed large area of acute intraparenchymal hemorrhage within the right temporoparietal lobe 4.0 x 4.0 x 4.1 cm with surrounding vasogenic edema with mild effacement of the occipital horn and atrium of the right lateral ventricle.  Small amount of subarachnoid hemorrhage within [place] at the anterior right temporal lobe.  3 mm  right to left midline shift.  ?  ?COGNITION: ?Overall cognitive status: Impaired: Memory: Deficits impaired STM ?Awareness: Deficits impaired safety awareness ?Problem solving: Deficits poor executive reasoning  ?Behavior: Impulsive  ?  ?PATIENT EDUCATION: ?Education details: Reviewed having family adjust abdominal binder when BP drops. ?Person educated: Patient  ?Education method: Explanation, Demonstration, and Verbal cues ?Education comprehension: verbalized understanding and needs further education ?  ?HOME EXERCISE PROGRAM: ?Access Code: [email or web address] ?URL: [email or web address] ?Date: 08/08/2021 ?Prepared by: Mickie Bail Plaster ? ?Exercises ?- Sit to Stand with Resistance Around Legs  - 1 x daily - 7 x weekly - 3 sets - 10 reps ?- Heel to toe walking   - 1 x daily - 7 x weekly - 3 sets - 10 reps ?- Side Stepping with Resistance at Thighs and Counter Support  - 1 x daily - 7 x weekly - 3 sets - 10 reps ?- Standing March with Counter Support  - 1 x daily - 7 x weekly - 3 sets - 10 reps - 2 second hold ?  ?GOALS: ?Goals reviewed with patient? Yes ?  ?SHORT TERM GOALS: Target date: 09/03/2021 ?  ?Pt will perform initial HEP with S* for improved strength, balance, transfers and gait. ?  ?Baseline:  ?Goal status: INITIAL ?  ?2.  Pt will improve gait velocity to at least 2.3 ft/s with LRAD and S* for improved gait efficiency and functional mobility ?Baseline: 1.77 ft/s with rollator w/CGA ?Goal status: INITIAL ?  ?3.  Pt will improve Berg score to 42/56 for decreased fall risk ? ?Baseline: 35/56 ?Goal status: INITIAL ?  ?4.  Pt will improve 30s STS to 11 repetitions or greater without BUE support to demonstrate improved functional strength and transfer efficiency.   ?Baseline: 8 reps without BUE support  ?Goal status: INITIAL ?  ?  ?LONG TERM GOALS: Target date: 10/01/2021 ?  ?Pt will ascend/descend 4 steps using step-to pattern and LRAD mod I to imitate home environment  ?Baseline:  ?Goal status: INITIAL ?  ?2.   Pt will improve normal TUG to less than or equal to 15 seconds w/LRAD mod I for improved functional mobility and decreased fall risk. ?  ?Baseline: 22.17s w/rollator and CGA ?Goal status: INITIAL ?  ?3.  Pt will improve gait velocity to at least 2.7 ft/s with LRAD mod I for improved gait efficiency and performance at community ambulator level  ?  ?Baseline: 1.77 ft/s with rollator and CGA ?Goal status: INITIAL ?  ?4.  Pt will improve Berg score to 50/56 for decreased fall risk ? ?Baseline: 35/56 ?Goal status: INITIAL ?  ?  ?ASSESSMENT: ?  ?CLINICAL IMPRESSION: ?Focus of session on activity tolerance with close monitoring of patient cognitive status and BP in response to standing and resistance activities.  Pt mildly orthostatic at end of session following standing dynamic balance activity.  She was asymptomatic throughout session.  PT trialed SciFit for aerobic endurance and incorporated visual scanning into neuromuscular re-education tasks.  Continue per POC. ?  ?  ?OBJECTIVE IMPAIRMENTS Abnormal gait, cardiopulmonary status limiting activity, decreased activity tolerance, decreased balance, decreased cognition, decreased  coordination, decreased endurance, decreased knowledge of condition, difficulty walking, decreased strength, decreased safety awareness, impaired perceived functional ability, and impaired vision/preception.  ?  ?ACTIVITY LIMITATIONS cleaning, driving, meal prep, laundry, medication management, yard work, and Dentist.  ?  ?PERSONAL FACTORS Age, Fitness, and 1 comorbidity: orthostatic hypotension  are also affecting patient's functional outcome.  ?  ?  ?REHAB POTENTIAL: Good ?  ?CLINICAL DECISION MAKING: Stable/uncomplicated ?  ?EVALUATION COMPLEXITY: Low ?  ?PLAN: ?PT FREQUENCY: 2x/week ?  ?PT DURATION: 12 weeks ?  ?PLANNED INTERVENTIONS: Therapeutic exercises, Therapeutic activity, Neuromuscular re-education, Balance training, Gait training, Patient/Family education, Joint mobilization,  Stair training, Visual/preceptual remediation/compensation, and Cognitive remediation ?  ?PLAN FOR NEXT SESSION: How is HEP? Scifit for endurance, coordination tasks for LLE, scanning tasks on L side, BLE strength *

## 2021-08-19 NOTE — Patient Instructions (Signed)
Start by filling out your pillbox with your daughter in law to help you/supervise  ? ?Do one pill bottle at a time (look at the label and what the pill looks like to help guide you)  ? ?Move the pill bottle to the other side of the table once you have finished it.  ? ?Make sure you double check after you get finished with each medication ?

## 2021-08-20 ENCOUNTER — Encounter: Payer: Medicare Other | Admitting: Speech Pathology

## 2021-08-20 ENCOUNTER — Encounter: Payer: Self-pay | Admitting: Occupational Therapy

## 2021-08-20 ENCOUNTER — Ambulatory Visit: Payer: Medicare Other | Admitting: Speech Pathology

## 2021-08-20 ENCOUNTER — Ambulatory Visit: Payer: Medicare Other | Admitting: Occupational Therapy

## 2021-08-20 ENCOUNTER — Ambulatory Visit: Payer: Medicare Other | Admitting: Physical Therapy

## 2021-08-20 ENCOUNTER — Encounter: Payer: Self-pay | Admitting: Physical Therapy

## 2021-08-20 ENCOUNTER — Ambulatory Visit: Payer: Medicare Other

## 2021-08-20 DIAGNOSIS — R2689 Other abnormalities of gait and mobility: Secondary | ICD-10-CM

## 2021-08-20 DIAGNOSIS — M6281 Muscle weakness (generalized): Secondary | ICD-10-CM

## 2021-08-20 DIAGNOSIS — H53462 Homonymous bilateral field defects, left side: Secondary | ICD-10-CM

## 2021-08-20 DIAGNOSIS — R41841 Cognitive communication deficit: Secondary | ICD-10-CM

## 2021-08-20 DIAGNOSIS — R2681 Unsteadiness on feet: Secondary | ICD-10-CM

## 2021-08-20 NOTE — Therapy (Signed)
?OUTPATIENT SPEECH LANGUAGE PATHOLOGY TREATMENT NOTE ? ? ?Patient Name: Lauren Conner ?MRN: [id number] ?DOB:1940/10/31, 81 y.o., female ?Today's Date: 08/20/2021 ? ?PCP: [facility] ?REFERRING PROVIDER: Cathlyn Parsons, PA-C  ? ? End of Session - 08/20/21 1021   ? ? Visit Number 6   ? Number of Visits 17   ? Date for SLP Re-Evaluation 10/29/21   ? Authorization Type Medicare   ? Progress Note Due on Visit 10   ? SLP Start Time 1015   ? SLP Stop Time  1058   ? SLP Time Calculation (min) 43 min   ? Activity Tolerance Patient tolerated treatment well   ? ?  ?  ? ?  ? ? ? ? ?No past medical history on file. ?No past surgical history on file. ?Patient Active Problem List  ? Diagnosis Date Noted  ? Intraparenchymal hemorrhage of brain ([place]) 07/26/2021  ? Bronchiectasis with acute exacerbation ([place])   ? Encephalopathy acute 07/21/2021  ? Intracerebral hemorrhage 07/21/2021  ? Acute renal failure ([place])   ? Acute urinary retention 07/20/2021  ? Bilateral hydronephrosis 07/20/2021  ? Osteoporosis without current pathological fracture 07/20/2021  ? CKD (chronic kidney disease) stage 4, GFR 15-29 ml/min (HCC) 07/19/2021  ? Paroxysmal SVT (supraventricular tachycardia) ([place]) 07/19/2021  ? Severe sepsis ([place]) 07/19/2021  ? Pneumonia of both lungs due to infectious organism 07/19/2021  ? Acute metabolic encephalopathy 62/83/1517  ? Anemia of chronic kidney failure, stage 4 (severe) ([place]) 07/19/2021  ? Metabolic acidosis 61/60/7371  ? Hyponatremia 07/19/2021  ? Essential hypertension 11/17/2018  ? Hypothyroidism (acquired) 12/06/2015  ? Adult bronchiectasis ([place]) 03/25/2011  ? ? ?ONSET DATE: 07/19/2021 ? ?REFERRING DIAG: I61.9 (ICD-10-CM) - Intraparenchymal hemorrhage of brain ([place])  ? ?THERAPY DIAG:  ?Cognitive communication deficit ? ?SUBJECTIVE: "I had a big win today, I dressed myself completely."  ? ?PAIN:  ?Are you having pain? No ? ?OBJECTIVE:  ? ?TODAY'S TREATMENT: ?08-20-21: Reviewed calendar usage as tool to aid  in memory and orientation. Recommend that pt consolidate information from her personal planner which she was using prior to stroke onto large calendar to have all information in one place. Completed problem solving tasks involving medication management. Generated solution to real life scenario in which her pharmacy is out of one of her medications. Helped pt generate a script to use when calling pharmacy to see which medication is was and when it would be expected to be refilled. Provided written sequence for deciding if she needed to have prescription sent to another pharmacy.  Max-A required for successful completion of functional problem solving. Using guiding questions, pt appears to question her responses, and often telling ST "I don't know." Pt able to verbalize x4 medication management strategies with mod I (use medication box, keep where she can see, use delivery service, take pills at same time daily).  ? ?08-18-21: Pt returned with medication bottles and pill box for pill box sorting task. Pill box already filled out for the week. Sunday AM and noon medications still present in pill box but pt unable to recall why they were still present as she recalled taking them. Daughter-in-law is currently organizing pill box and providing medications at designated times. SLP targeted medication sorting task to plan where each medication would be sorted based on each individual pill bottle. Occasional fading to rare min A required to recall specific medication instructions and identify correct placement. Pt benefited from visually describing medication to aid recall and placement. SLP instructed double  checking after each medication to ensure accuracy. In subsequent medication task to identify errors within pre-sorted pill box, pt required intermittent mod fading to min A to ID errors for 2 medications. Pt benefited from cues to focus on one medication a time and visual cues to reorient to instructions. Pt continues to  use calendar at home to write down appointments, which has reportedly been successful.  ? ?08-14-21: Target external compensations for memory and orientation. Generated strategies with patient on best way she can begin to manage her schedule. Pt reports residual blurriness in L eye (is planning to make appt with opthalmology) and thus will plan to obtain large desk calendar on which she can write in all of her appointments. ST advises pt create a routine of referring to calendar daily to orient to date, schedule for the day/week, and also use side bar of calendar to keep running to do list of important things pt needs to accomplish which are not a part of daily routine. Plan to target medication management next session, if able.  ? ?08-12-21: Education provided on internal and external memory strategies. Pt generates x1 situation for each strategy presented on how/when she could implement usage. Provide education on consideration for using internal vs. external strategies (complexity, quantity, etc.). Administered cognitive fx PROM- Pt self scores 89.  ?  ?  ?PATIENT EDUCATION: ?Education details: see above ?Person educated: Patient  ?Education method: Explanation, Demonstration, and Handout ?Education comprehension: verbalized understanding, returned demonstration, and needs further education ?  ?  ?GOALS: ?Goals reviewed with patient? Yes ?  ?SHORT TERM GOALS: Target date: 09/03/2021 ?  ?Pt will complete CLQT and PROM within first 2 sessions. ?Baseline: initiated ?Goal status: ONGOING ?  ?2.  Pt will accurately sort medications into weekly pill box with occasional min A over 2 sessions.  ?Baseline: medications being managed by care partner ?Goal status: ONGOING ?  ?3.  Pt will report successful implementation at home of x2 attention/memory compensations with occasional mod A over 2 sessions.  ?Baseline: reports difficulty focusing; impaired memory observed in session ?Goal status: ONGOING ?  ?4.  Pt will demonstrate  adeuqate organizational skills to complete mod complex therapy tasks with usual mod A over 2 sessions  ?Baseline: attention, ex fx skills inhibiting successful completion of tasks ?Goal status: ONGOING ?  ?5.  Pt will report increased household participation in x2 desired activities (shopping, meal prep, medication/financial management) with supervision from family members   ?Baseline: requires total A for ADLs and IADLS ?Goal status: ONGOING ?  ?  ?LONG TERM GOALS: Target date: 10/29/2021 ?  ?Pt will successfully manage household activities using external aids (to-do list, schedule, etc) with rare min A over 1 weeks  ?Baseline: total A for all ADLs ?Goal status: ONGOING ?  ?2.  Pt will successfully implement a bill paying system with rare min A over 1 week period to promote independence with managing finances ?Baseline: requires total A ?Goal status: ONGOING ?  ?3.  Pt will demonstrate adeuqate organizational skills to complete mod complex therapy tasks with modified independence (compensations) in 2 sessions  ?Baseline: attention, ex fx skills inhibiting successful completion of tasks ?Goal status: ONGOING ? ?4. Pt will demonstrate 5 pt improvement on Cognitive Fx - Abilities PROM by d/c.  ?Baseline: 89 ?Goal Status: ONGOING ?  ?  ?ASSESSMENT: ?  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 Pt presents with impairments in domains of executive function, attention,  memory, and problem solving. Pt reports difficulty with simple every day tasks such as using remote or cell phone. Demonstrates intellectual and emergent awareness which causes frustration for pt. Demonstrates poor frustration tolerance but is able to reengage given encouragement from ST in session. Requires total A for ADLs and IADLs at this time, prior to acute event pt was fully independent. Pt desires to return to living independently. Pt stated goals are to return to  managing finances, managing medications, taking care of home to include household chores, and meal planning and grocery shopping for self. ?  ?OBJECTIVE IMPAIRMENTS include attention, memory, and executive

## 2021-08-20 NOTE — Patient Instructions (Signed)
?  SELF ASSISTED WITH OBJECT: Shoulder Flexion - Sitting ? ? ? ?Hold cane with both hands. Raise arms up. ?_10__ reps per set, _2__ sets per day ? ? ?ROM: Abduction - Wand ? ? ?SEATED: Holding wand with left hand palm up, push wand directly out to left side, leading with other hand palm down, until stretch is felt. Hold 5 seconds. Repeat to Rt side with right palm up.  ?Repeat 10 times per set. Do 2-3 sessions per day. ? ? ? ?ROM: Horizontal Abduction / Adduction - Wand ? ? ? ?DO SEATED: Keeping both palms down, push right hand across body with other hand. Then pull back across body, keeping arms parallel to floor. Do not allow trunk to twist. Repeat __10__ times per set. Do __2__ sessions per day. ? ? ? ?

## 2021-08-20 NOTE — Therapy (Signed)
?OUTPATIENT OCCUPATIONAL THERAPY TREATMENT NOTE ? ? ?Patient Name: Lauren Conner ?MRN: [id number] ?DOB:06/18/1940, 81 y.o., female ?Today's Date: 08/08/2021 ? ?PCP: [facility] ?REFERRING PROVIDER: Dr. Naaman Plummer ? ? OT End of Session - 08/20/21 1101   ? ? Visit Number 5   ? Number of Visits 17   ? Date for OT Re-Evaluation 10/01/21   ? Authorization Type MCR, [employer]   ? Authorization - Visit Number 5   ? Authorization - Number of Visits 10   ? Progress Note Due on Visit 10   ? OT Start Time 1100   ? OT Stop Time 1145   ? OT Time Calculation (min) 45 min   ? Activity Tolerance Patient tolerated treatment well   ? Behavior During Therapy [facility] for tasks assessed/performed   ? ?  ?  ? ?  ? ? ? ?  ? ? ? ?Patient Active Problem List  ? Diagnosis Date Noted  ? 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 Bronchiectasis with acute exacerbation ([place])   ? Encephalopathy acute 07/21/2021  ? Intracerebral hemorrhage 07/21/2021  ? Acute renal failure ([place])   ? Acute urinary retention 07/20/2021  ? Bilateral hydronephrosis 07/20/2021  ? Osteoporosis without current pathological fracture 07/20/2021  ? CKD (chronic kidney disease) stage 4, GFR 15-29 ml/min (HCC) 07/19/2021  ? Paroxysmal SVT (supraventricular tachycardia) ([place]) 07/19/2021  ? Severe sepsis ([place]) 07/19/2021  ? Pneumonia of both lungs due to infectious organism 07/19/2021  ? Acute metabolic encephalopathy 38/18/2993  ? Anemia of chronic kidney failure, stage 4 (severe) ([place]) 07/19/2021  ? Metabolic acidosis 71/69/6789  ? Hyponatremia 07/19/2021  ? Essential hypertension 11/17/2018  ? Hypothyroidism (acquired) 12/06/2015  ? Adult bronchiectasis ([place]) 03/25/2011  ? ? ?ONSET DATE: 07/19/21 ? ?REFERRING DIAG: I61.9 (ICD-10-CM) - Intraparenchymal hemorrhage of brain ([place])  ? ?THERAPY DIAG:  ?Left homonymous hemianopsia -  ?Unsteadiness on feet - ?Muscle weakness (generalized) -  ?Other symptoms and signs involving cognitive functions following cerebral  infarction  ? ?SUBJECTIVE:  ? ?SUBJECTIVE STATEMENT: ?I think I'm doing better ? ? ?PERTINENT HISTORY: TAYLOR SPILDE is a 81 y.o. right-handed female with history of anemia of chronic disease, paroxysmal SVT hypothyroidism hypertension CKD stage IV followed by Dr.Lehrich with nephrology at [facility] with creatinine baseline 2.0.  ? ?PRECAUTIONS: Fall and Other: no driving, abdominal binder d/t orthostatic hypotension, foley catheter, and Lt visual field cut ? ?WEIGHT BEARING RESTRICTIONS No ? ? ?SUBJECTIVE: Pt denies pain ? ?PAIN:  ?Are you having pain? No ? ? ? ? ?OBJECTIVE:  ?TODAY'S TREATMENT:  ?  ?(See below pt education - see pt instructions for details) ? ?Copying small peg design (hourglass design) for visual/perceptual skills and tabletop scanning - pt doing w/ min cues for diagonal lines.  ? ?Environmental scanning: finding 15/15 on first pass (100% accuracy!) up hallways. BP after walking: 140/81, HR = 91 ? ?08/20/21: PATIENT EDUCATION: ?Education details: HEP for BUE's for UB endurance ?Person educated: Patient ?Education method: Explanation, Demonstration, Verbal cues, and Handouts ?Education comprehension: verbalized understanding, returned demonstration, and verbal cues required ? ? ?08/19/21: PATIENT EDUCATION: ?Education details: memory strategies ?Person educated: Patient ?Education method: Explanation, Verbal cues, and Handouts ?Education comprehension: verbalized understanding and needs further education ? ? ? ? ?GOALS: ? ? ?SHORT TERM GOALS: Target date: 09/03/2021 ? ?Pt will be independent with HEP targeting overall endurance ? ?Baseline: n/a ?Goal status: ONGOING (issued 08/20/21) ? ?2.  Pt to verbalize understanding with visual scanning strategies ?  Baseline: dep ?Goal status: ONGOING (handout issued 08/08/21) ? ?3.  Pt will perform basic snack prep w/ supervision  ?Baseline: dep ?Goal status: INITIAL ? ?4.  Pt to perform tabletop scanning attending to Lt side 75% or greater ?Baseline:  dep ?Goal status: ONGOING ? ?5.  Pt to verbalize understanding with memory compensatory strategies ?Baseline: dep ?Goal status: ONGOING (handout issued 08/19/21) ? ? ? ?LONG TERM GOALS: Target date: 10/01/2021 ? ?Pt will perform simple stovetop cooking task and light housekeeping at mod I and good safety awareness ?Baseline: dep ?Goal status: INITIAL ? ?2.  Pt will perform environmental scanning with 85% accuracy in a min distracting environment.   ?Baseline: dep ?Goal status: INITIAL ? ?3.  Pt to perform BADLS I'ly  ?Baseline: min assist for shower transfers and dressing ?Goal status: INITIAL ? ?4.  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 ?5.  Pt to perform dynamic standing tasks safely w/o LOB ?Baseline: unsteadiness ?Goal status: INITIAL ? ? ?ASSESSMENT: ? ?CLINICAL IMPRESSION: ?Pt demo increased environmental scanning. Pt also copying peg design fairly well ? ?PERFORMANCE DEFICITS in functional skills including ADLs, IADLs, sensation, strength, FMC, mobility, balance, endurance, decreased knowledge of precautions, decreased knowledge of use of DME, vision, and UE functional use, cognitive skills including attention, memory, perception, problem solving, safety awareness, and sequencing,  ? ?IMPAIRMENTS are limiting patient from ADLs, IADLs, and social participation.  ? ?COMORBIDITIES may have co-morbidities  that affects occupational performance. Patient will benefit from skilled OT to address above impairments and improve overall function. ? ?MODIFICATION OR ASSISTANCE TO COMPLETE EVALUATION: Min-Moderate modification of tasks or assist with assess necessary to complete an evaluation. ? ?OT OCCUPATIONAL PROFILE AND HISTORY: Detailed assessment: Review of records and additional review of physical, cognitive, psychosocial history related to current functional performance. ? ?CLINICAL DECISION MAKING:  Moderate - several treatment options, min-mod task modification necessary ? ?REHAB POTENTIAL: Good ? ?EVALUATION COMPLEXITY: Moderate ? ? ? ?PLAN: ?OT FREQUENCY: 2x/week ? ?OT DURATION: 8 weeks ? ?PLANNED INTERVENTIONS: self care/ADL training, therapeutic exercise, therapeutic activity, neuromuscular re-education, manual therapy, functional mobility training, patient/family education, cognitive remediation/compensation, visual/perceptual remediation/compensation, coping strategies training, and DME and/or AE instructions ? ?RECOMMENDED OTHER SERVICES: n/a ? ?CONSULTED AND AGREED WITH PLAN OF CARE: Patient  ? ?PLAN FOR NEXT SESSION: visual/perceptual skills, environmental scanning in gym (monitor BP),  ? ? ?Redmond Baseman, OTR/L ?08/20/21 11:02 AM ?Phone [phone] ?FAX [phone] ? ? ?

## 2021-08-20 NOTE — Therapy (Signed)
?OUTPATIENT PHYSICAL THERAPY TREATMENT NOTE ? ? ?Patient Name: Lauren Conner ?MRN: [id number] ?DOB:05/22/1940, 81 y.o., female ?Today's Date: 08/20/2021 ? ?PCP: [facility] ?REFERRING PROVIDER: Cathlyn Parsons, PA-C  ? ? PT End of Session - 08/20/21 1157   ? ? Visit Number 5   ? Number of Visits 17   ? Date for PT Re-Evaluation 10/29/21   ? Authorization Type Medicare A & B   ? Progress Note Due on Visit 10   ? PT Start Time 1150   ? PT Stop Time 1231   ? PT Time Calculation (min) 41 min   ? Equipment Utilized During Treatment Gait belt;Other (comment)   ? Activity Tolerance Patient tolerated treatment well   ? Behavior During Therapy [facility] for tasks assessed/performed   ? ?  ?  ? ?  ? ? ? ?History reviewed. No pertinent past medical history. ?History reviewed. No pertinent surgical history. ?Patient Active Problem List  ? Diagnosis Date Noted  ? 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 Bronchiectasis with acute exacerbation ([place])   ? Encephalopathy acute 07/21/2021  ? Intracerebral hemorrhage 07/21/2021  ? Acute renal failure ([place])   ? Acute urinary retention 07/20/2021  ? Bilateral hydronephrosis 07/20/2021  ? Osteoporosis without current pathological fracture 07/20/2021  ? CKD (chronic kidney disease) stage 4, GFR 15-29 ml/min (HCC) 07/19/2021  ? Paroxysmal SVT (supraventricular tachycardia) ([place]) 07/19/2021  ? Severe sepsis ([place]) 07/19/2021  ? Pneumonia of both lungs due to infectious organism 07/19/2021  ? Acute metabolic encephalopathy 03/54/6568  ? Anemia of chronic kidney failure, stage 4 (severe) ([place]) 07/19/2021  ? Metabolic acidosis 12/75/1700  ? Hyponatremia 07/19/2021  ? Essential hypertension 11/17/2018  ? Hypothyroidism (acquired) 12/06/2015  ? Adult bronchiectasis ([place]) 03/25/2011  ? ? ?REFERRING DIAG: I61.9 (ICD-10-CM) - Intraparenchymal hemorrhage of brain ([place])  ? ?THERAPY DIAG:  ?Unsteadiness on feet ? ?Muscle weakness (generalized) ? ?Other abnormalities of gait and  mobility ? ?PERTINENT HISTORY: anemia of chronic disease, paroxysmal SVT hypothyroidism hypertension CKD stage IV  ? ?PRECAUTIONS: Fall and orthostatic. L homonymous hemianopsia  ? ?SUBJECTIVE: She had a headache this morning and high BP, took some tylenol which helped. ? ?PAIN:  ?Are you having pain? No ?TODAY'S TREATMENT: ? ?VITALS:  ?Vitals taken in LUE while seated prior to activity 134/86, HR 92bpm; abdominal binder and ted hose donned at home prior to PT. ? End of session:  158/101, HR 99bpm ? ?SciFit for endurance and dynamic warmup performed using BUE/BLE x15mins, BLE x84mins L2.5.  Pt reported modified RPE of 6/10.  Pt cued for complete LE ROM and engagement to task.  Given goal of 70 steps/min, pt with difficulty maintaining goal when using full LE ROM ?Standing in // bars progressed from BUE support to w/o support:  standing on rocker board holding level x30 sec EO>EC, controlled lateral L & R weight shifts w/ cues to tap board to floor and therapist provided initial weight shifts 2x12, standing on airex normal and narrow BOS x26min each, standing on airex alt toe taps w/ cues for strong stance leg and controlled toe tap ?Pt ambulates 115' in gym w/ rollator using L & R visual scanning and functional reach outside BOS to retrieve multicolored cones from various heights.  Min Cueing for scanning and rollator management with obstacles in gym and SBA for gait. ? ?OBJECTIVE:  ?  ?DIAGNOSTIC FINDINGS: Cranial CT scan showed large area of acute intraparenchymal hemorrhage within the right temporoparietal lobe 4.0 x  4.0 x 4.1 cm with surrounding vasogenic edema with mild effacement of the occipital horn and atrium of the right lateral ventricle.  Small amount of subarachnoid hemorrhage within [place] at the anterior right temporal lobe.  3 mm right to left midline shift.  ?  ?COGNITION: ?Overall cognitive status: Impaired: Memory: Deficits impaired STM ?Awareness: Deficits impaired safety awareness ?Problem solving:  Deficits poor executive reasoning  ?Behavior: Impulsive  ?  ?PATIENT EDUCATION: ?Education details: Edu on BP parameters for exercise and importance of doing HEP during time off from therapy. ?Person educated: Patient  ?Education method: Explanation, Demonstration, and Verbal cues ?Education comprehension: verbalized understanding and needs further education ?  ?HOME EXERCISE PROGRAM: ?Access Code: [email or web address] ?URL: [email or web address] ?Date: 08/08/2021 ?Prepared by: Mickie Bail Plaster ? ?Exercises ?- Sit to Stand with Resistance Around Legs  - 1 x daily - 7 x weekly - 3 sets - 10 reps ?- Heel to toe walking   - 1 x daily - 7 x weekly - 3 sets - 10 reps ?- Side Stepping with Resistance at Thighs and Counter Support  - 1 x daily - 7 x weekly - 3 sets - 10 reps ?- Standing March with Counter Support  - 1 x daily - 7 x weekly - 3 sets - 10 reps - 2 second hold ?  ?GOALS: ?Goals reviewed with patient? Yes ?  ?SHORT TERM GOALS: Target date: 09/03/2021 ?  ?Pt will perform initial HEP with S* for improved strength, balance, transfers and gait. ?  ?Baseline:  ?Goal status: INITIAL ?  ?2.  Pt will improve gait velocity to at least 2.3 ft/s with LRAD and S* for improved gait efficiency and functional mobility ?Baseline: 1.77 ft/s with rollator w/CGA ?Goal status: INITIAL ?  ?3.  Pt will improve Berg score to 42/56 for decreased fall risk ? ?Baseline: 35/56 ?Goal status: INITIAL ?  ?4.  Pt will improve 30s STS to 11 repetitions or greater without BUE support to demonstrate improved functional strength and transfer efficiency.   ?Baseline: 8 reps without BUE support  ?Goal status: INITIAL ?  ?  ?LONG TERM GOALS: Target date: 10/01/2021 ?  ?Pt will ascend/descend 4 steps using step-to pattern and LRAD mod I to imitate home environment  ?Baseline:  ?Goal status: INITIAL ?  ?2.  Pt will improve normal TUG to less than or equal to 15 seconds w/LRAD mod I for improved functional mobility and decreased fall risk. ?  ?Baseline:  22.17s w/rollator and CGA ?Goal status: INITIAL ?  ?3.  Pt will improve gait velocity to at least 2.7 ft/s with LRAD mod I for improved gait efficiency and performance at community ambulator level  ?  ?Baseline: 1.77 ft/s with rollator and CGA ?Goal status: INITIAL ?  ?4.  Pt will improve Berg score to 50/56 for decreased fall risk ? ?Baseline: 35/56 ?Goal status: INITIAL ?  ?  ?ASSESSMENT: ?  ?CLINICAL IMPRESSION: ?BP did not drop following session today with diastolic rising to >235 at end of session.  Pt remained asymptomatic throughout NMR and NuStep activities.  Continue to monitor BP as POC progresses. ?  ?  ?OBJECTIVE IMPAIRMENTS Abnormal gait, cardiopulmonary status limiting activity, decreased activity tolerance, decreased balance, decreased cognition, decreased coordination, decreased endurance, decreased knowledge of condition, difficulty walking, decreased strength, decreased safety awareness, impaired perceived functional ability, and impaired vision/preception.  ?  ?ACTIVITY LIMITATIONS cleaning, driving, meal prep, laundry, medication management, yard work, and Dentist.  ?  ?PERSONAL FACTORS Age, Fitness, and 1  comorbidity: orthostatic hypotension  are also affecting patient's functional outcome.  ?  ?  ?REHAB POTENTIAL: Good ?  ?CLINICAL DECISION MAKING: Stable/uncomplicated ?  ?EVALUATION COMPLEXITY: Low ?  ?PLAN: ?PT FREQUENCY: 2x/week ?  ?PT DURATION: 12 weeks ?  ?PLANNED INTERVENTIONS: Therapeutic exercises, Therapeutic activity, Neuromuscular re-education, Balance training, Gait training, Patient/Family education, Joint mobilization, Stair training, Visual/preceptual remediation/compensation, and Cognitive remediation ?  ?PLAN FOR NEXT SESSION: How is HEP? Scifit for endurance, coordination tasks for LLE, scanning tasks on L side, BLE strength *MONITOR BP* standing tolerance, static balance outside BOS-reaching; challenge without vision, work on backwards walking, dual  tasking ? ? ? ?Bary Richard, PT, DPT ?08/20/2021, 1:00 PM ? ?   ?

## 2021-08-20 NOTE — Patient Instructions (Signed)
Call the pharmacy ([facility]) ?Verify which medication they are out of ?"You called to tell me you did not have one of my meds, could you remind me which medication that is?"   ?Write it down: _______________________ ?Ask, "when do you think you will get that in stock?"  ?Write that down: _________________ ? Find that pill bottle and make sure you'll have enough until they can send your refill  ?Need to know how many days until it will arrive ?How many pills you'll need between now and then ?Count how many you have left  ? ? ?Solutions to the problem: ?If you have enough, just wait until they can fill it ?If you don't have enough, ask Dr. to send your prescription to your local pharmacy ([facility]) and send someone to go pick it up for you  ? ?Voicemails: ?Listen as many times as you need ?Write down important information ?Pause the recording as need ?If you have questions, call the number back or ask for help   ? ?

## 2021-08-22 ENCOUNTER — Other Ambulatory Visit: Payer: Self-pay

## 2021-08-22 ENCOUNTER — Emergency Department (HOSPITAL_COMMUNITY)
Admission: EM | Admit: 2021-08-22 | Discharge: 2021-08-22 | Disposition: A | Discharge status: Home / Self Care | Payer: Medicare Other | Attending: Emergency Medicine | Admitting: Emergency Medicine

## 2021-08-22 ENCOUNTER — Encounter (HOSPITAL_COMMUNITY): Payer: Self-pay | Admitting: Pharmacy Technician

## 2021-08-22 ENCOUNTER — Emergency Department (HOSPITAL_COMMUNITY): Payer: Medicare Other

## 2021-08-22 DIAGNOSIS — Z79899 Other long term (current) drug therapy: Secondary | ICD-10-CM | POA: Diagnosis not present

## 2021-08-22 DIAGNOSIS — R519 Headache, unspecified: Secondary | ICD-10-CM

## 2021-08-22 DIAGNOSIS — I10 Essential (primary) hypertension: Secondary | ICD-10-CM | POA: Diagnosis not present

## 2021-08-22 HISTORY — DX: Disorder of kidney and ureter, unspecified: N28.9

## 2021-08-22 HISTORY — DX: Essential (primary) hypertension: I10

## 2021-08-22 LAB — CBC WITH DIFFERENTIAL/PLATELET
Abs Immature Granulocytes: 0 10*3/uL (ref 0.00–0.07)
Basophils Absolute: 0.1 10*3/uL (ref 0.0–0.1)
Basophils Relative: 2 %
Eosinophils Absolute: 0.4 10*3/uL (ref 0.0–0.5)
Eosinophils Relative: 9 %
HCT: 36.2 % (ref 36.0–46.0)
Hemoglobin: 11.4 g/dL — ABNORMAL LOW (ref 12.0–15.0)
Immature Granulocytes: 0 %
Lymphocytes Relative: 42 %
Lymphs Abs: 1.8 10*3/uL (ref 0.7–4.0)
MCH: 30.4 pg (ref 26.0–34.0)
MCHC: 31.5 g/dL (ref 30.0–36.0)
MCV: 96.5 fL (ref 80.0–100.0)
Monocytes Absolute: 0.6 10*3/uL (ref 0.1–1.0)
Monocytes Relative: 13 %
Neutro Abs: 1.5 10*3/uL — ABNORMAL LOW (ref 1.7–7.7)
Neutrophils Relative %: 34 %
Platelets: 183 10*3/uL (ref 150–400)
RBC: 3.75 MIL/uL — ABNORMAL LOW (ref 3.87–5.11)
RDW: 15.1 % (ref 11.5–15.5)
WBC: 4.2 10*3/uL (ref 4.0–10.5)
nRBC: 0 % (ref 0.0–0.2)

## 2021-08-22 LAB — COMPREHENSIVE METABOLIC PANEL
ALT: 11 U/L (ref 0–44)
AST: 15 U/L (ref 15–41)
Albumin: 3.1 g/dL — ABNORMAL LOW (ref 3.5–5.0)
Alkaline Phosphatase: 68 U/L (ref 38–126)
Anion gap: 8 (ref 5–15)
BUN: 45 mg/dL — ABNORMAL HIGH (ref 8–23)
CO2: 21 mmol/L — ABNORMAL LOW (ref 22–32)
Calcium: 8.8 mg/dL — ABNORMAL LOW (ref 8.9–10.3)
Chloride: 110 mmol/L (ref 98–111)
Creatinine, Ser: 2.56 mg/dL — ABNORMAL HIGH (ref 0.44–1.00)
GFR, Estimated: 19 mL/min — ABNORMAL LOW (ref >60)
Glucose, Bld: 98 mg/dL (ref 70–99)
Potassium: 4.1 mmol/L (ref 3.5–5.1)
Sodium: 139 mmol/L (ref 135–145)
Total Bilirubin: 0.5 mg/dL (ref 0.3–1.2)
Total Protein: 6.4 g/dL — ABNORMAL LOW (ref 6.5–8.1)

## 2021-08-22 IMAGING — CT CT HEAD W/O CM
3 of 4 series · 13 of 47 positions shown, 15 images · non-contrast
Comparison: CT head [DATE], MR RECAVARREN [DATE]
COMPARISON: CT head [DATE], MR RECAVARREN [DATE]

Addendum:
CLINICAL DATA: Transient ischemic attack (TIA). Hypertension.
States blood pressure has been 188/100. Recently admitted with brain
bleed. Pt complains of headache.



[Series 4: head without · axial · non-contrast · 0.45mm/px · z∈[-104,+16]mm · 7 of 33 slices shown, 9 images]
[im 5/33  brain]
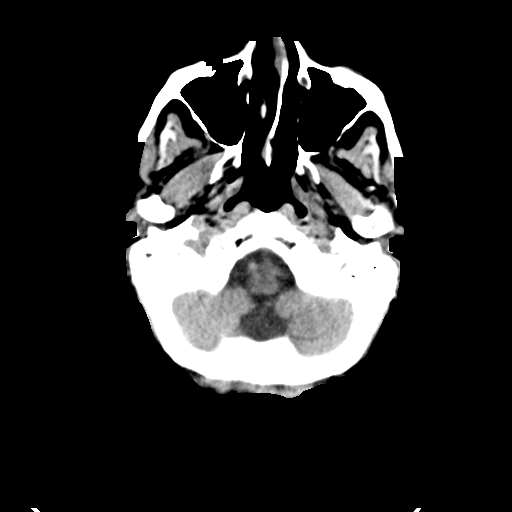
[im 5/33  bone]
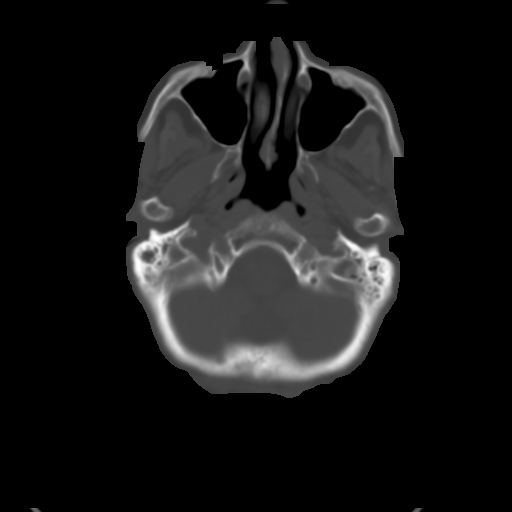
[im 9/33  brain]
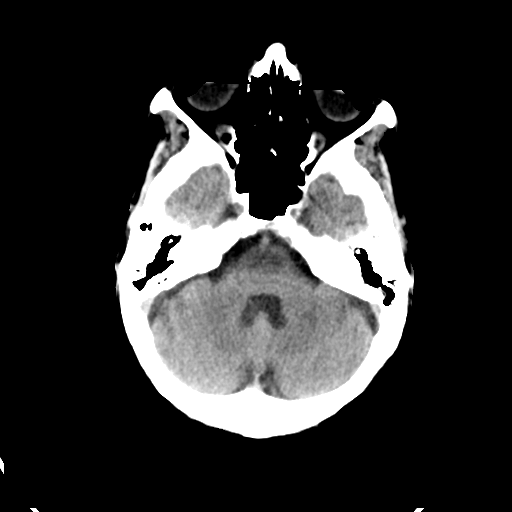
[im 13/33  brain]
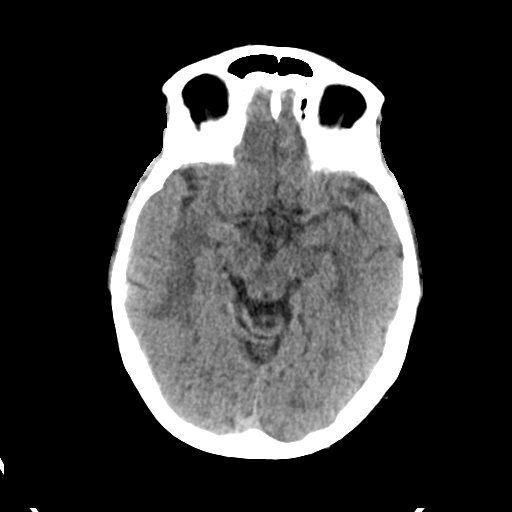
[im 17/33  brain]
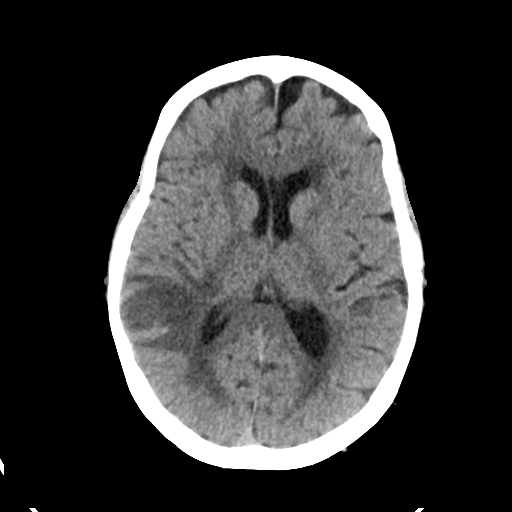
[im 21/33  brain]
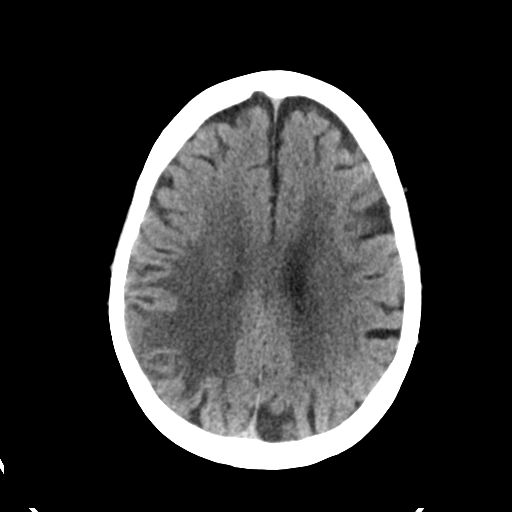
[im 21/33  bone]
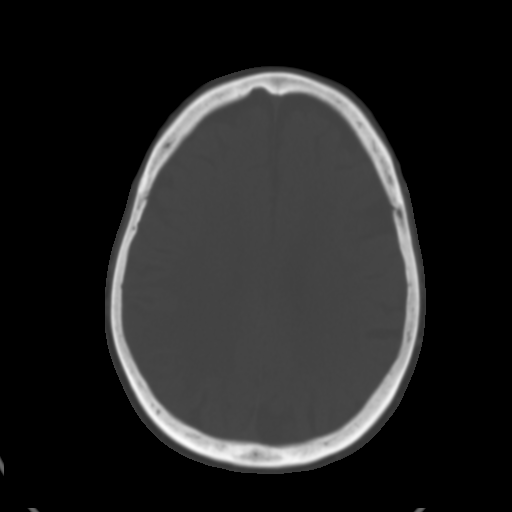
[im 25/33  brain]
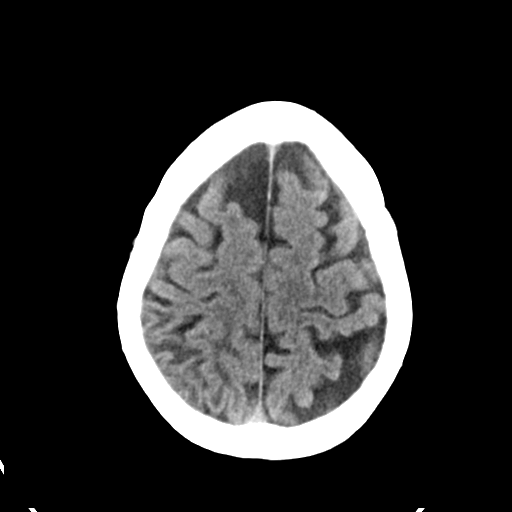
[im 29/33  brain]
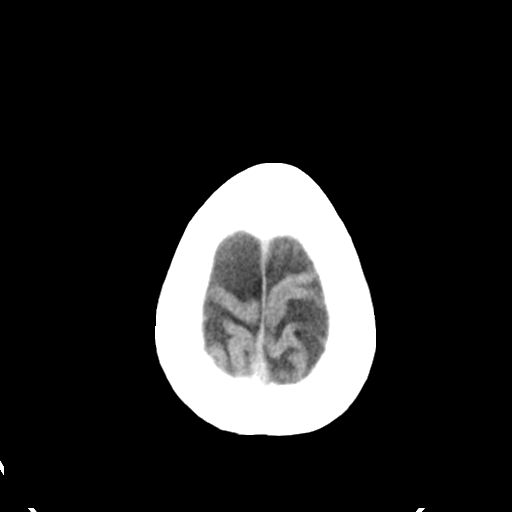

[Series 5: head without cor · coronal · non-contrast · 0.32mm/px · 3 of 67 slices shown]
[im 23/67  brain]
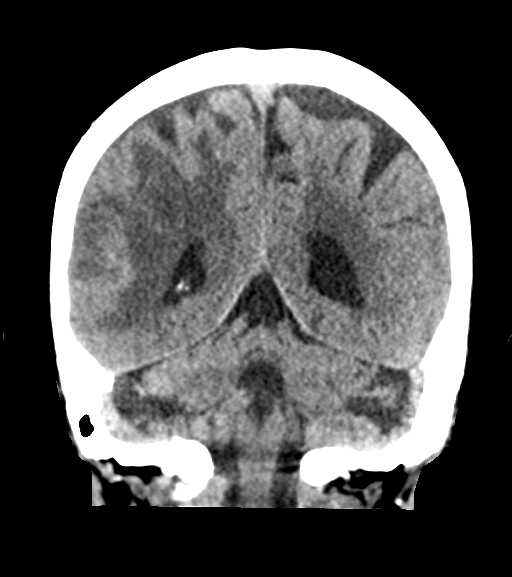
[im 30/67  brain]
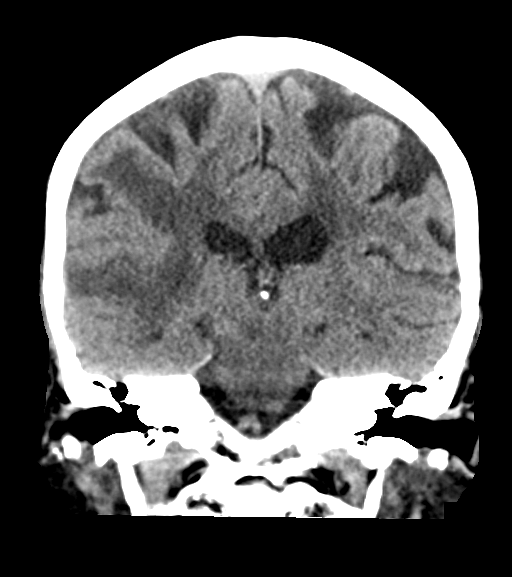
[im 37/67  brain]
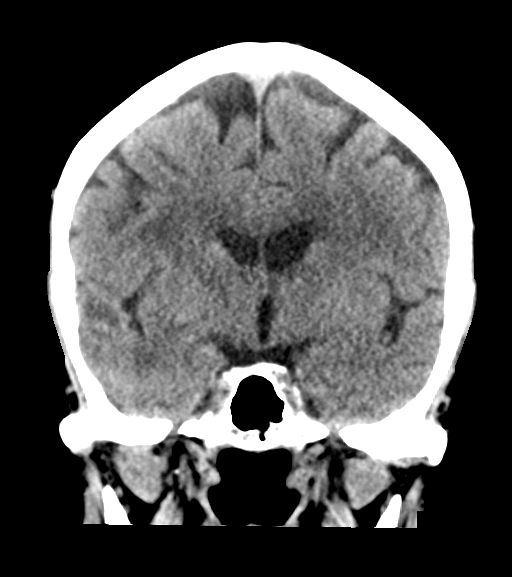

[Series 6: head without sag · sagittal · non-contrast · 0.33mm/px · 3 of 56 slices shown]
[im 19/56  brain]
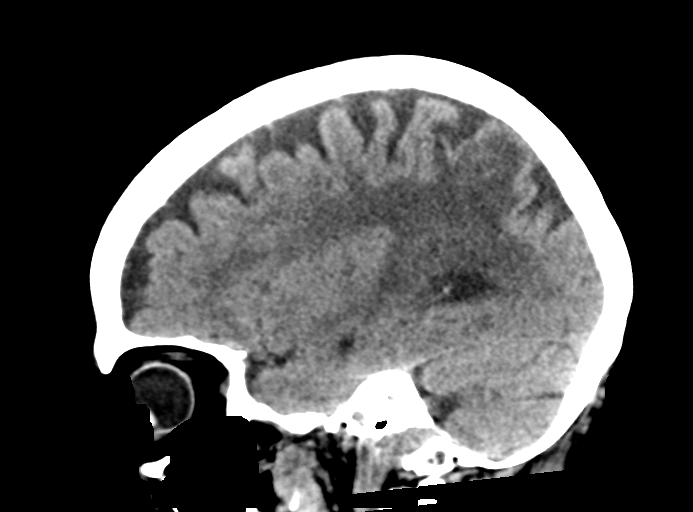
[im 28/56  brain]
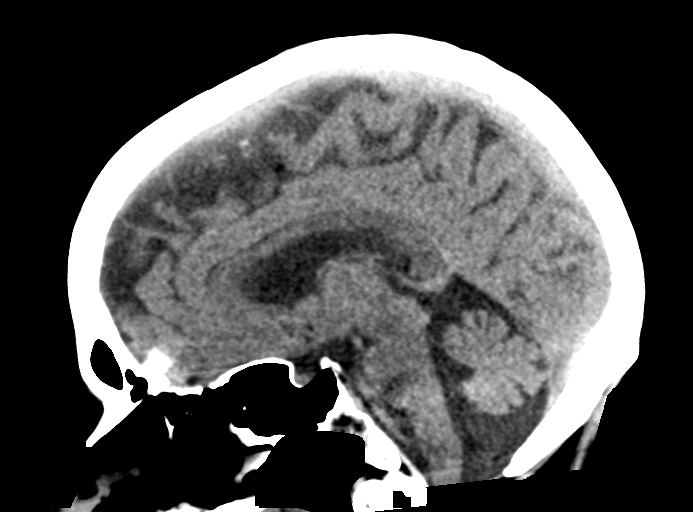
[im 37/56  brain]
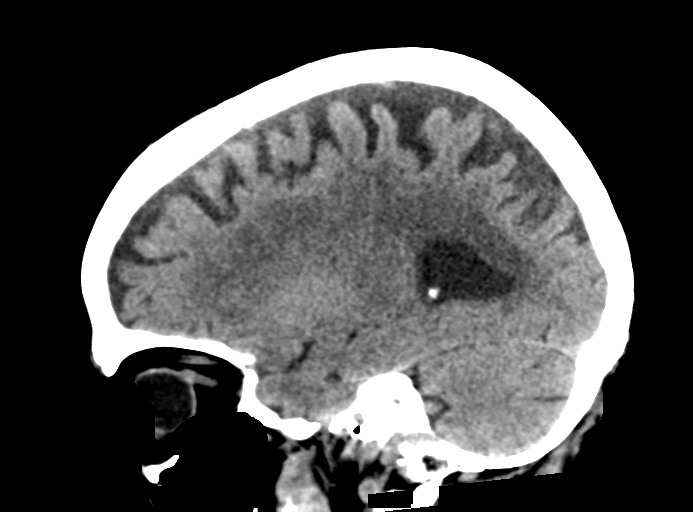

[13 of 47 positions shown; findings below may reference images not displayed]

FINDINGS: Brain:

Patchy and confluent areas of decreased attenuation are noted
throughout the deep and periventricular white matter of the cerebral
hemispheres bilaterally, compatible with chronic microvascular
ischemic disease. Expected interval evolution of a right temporal,
parietal, occipital encephalomalacia. No evidence of
large-territorial acute infarction. No parenchymal hemorrhage. No
mass lesion. Hyperdense curvilinear lesion of the right
parieto-occipital sulci along the encephalomalacia of prior
hemorrhage suggestive acute subarachnoid hemorrhage (4: 17-22).

No mass effect or midline shift. No hydrocephalus. Basilar cisterns
are patent.

Vascular: No hyperdense vessel. Atherosclerotic calcifications are
present within the cavernous internal carotid arteries.

Skull: No acute fracture or focal lesion.

Sinuses/Orbits: Trace partial bilateral mastoid air cell effusions.
Paranasal sinuses are clear. The orbits are unremarkable.

Other: None.
IMPRESSION: Interval development of an acute subarachnoid hemorrhage along the
right parietooccipital encephalomalacia.

ADDENDUM:
These results were called by telephone at the time of interpretation
on [DATE] at [DATE] to provider RECAVARREN , who verbally
acknowledged these results.

*** End of Addendum ***
FINDINGS: Brain:

Patchy and confluent areas of decreased attenuation are noted
throughout the deep and periventricular white matter of the cerebral
hemispheres bilaterally, compatible with chronic microvascular
ischemic disease. Expected interval evolution of a right temporal,
parietal, occipital encephalomalacia. No evidence of
large-territorial acute infarction. No parenchymal hemorrhage. No
mass lesion. Hyperdense curvilinear lesion of the right
parieto-occipital sulci along the encephalomalacia of prior
hemorrhage suggestive acute subarachnoid hemorrhage (4: 17-22).

No mass effect or midline shift. No hydrocephalus. Basilar cisterns
are patent.

Vascular: No hyperdense vessel. Atherosclerotic calcifications are
present within the cavernous internal carotid arteries.

Skull: No acute fracture or focal lesion.

Sinuses/Orbits: Trace partial bilateral mastoid air cell effusions.
Paranasal sinuses are clear. The orbits are unremarkable.

Other: None.
IMPRESSION: Interval development of an acute subarachnoid hemorrhage along the
right parietooccipital encephalomalacia.

## 2021-08-22 MED ORDER — CLEVIDIPINE BUTYRATE 0.5 MG/ML IV EMUL: 0.0000 mg/h | INTRAVENOUS | Status: DC

## 2021-08-22 MED ORDER — TRAMADOL HCL 50 MG PO TABS: 50.0000 mg | ORAL_TABLET | Freq: Two times a day (BID) | ORAL | 0 refills | Status: AC | PRN

## 2021-08-22 MED ORDER — ACETAMINOPHEN 325 MG PO TABS: 650.0000 mg | ORAL_TABLET | Freq: Once | ORAL | Status: DC

## 2021-08-22 MED ORDER — HYDRALAZINE HCL 20 MG/ML IJ SOLN
10.0000 mg | Freq: Once | INTRAMUSCULAR | Status: AC
  Administered 2021-08-22: 10 mg via INTRAVENOUS
  Filled 2021-08-22: qty 1

## 2021-08-22 MED ORDER — TRAMADOL HCL 50 MG PO TABS: 50.0000 mg | ORAL_TABLET | Freq: Once | ORAL | Status: DC

## 2021-08-22 MED ORDER — AMLODIPINE BESYLATE 5 MG PO TABS
5.0000 mg | ORAL_TABLET | Freq: Once | ORAL | Status: AC
  Administered 2021-08-22: 5 mg via ORAL
  Filled 2021-08-22: qty 1

## 2021-08-22 MED ORDER — CLEVIDIPINE BUTYRATE 0.5 MG/ML IV EMUL
0.0000 mg/h | INTRAVENOUS | Status: DC
  Administered 2021-08-22: 2 mg/h via INTRAVENOUS
  Filled 2021-08-22: qty 50

## 2021-08-22 MED ORDER — AMLODIPINE BESYLATE 2.5 MG PO TABS: 5.0000 mg | ORAL_TABLET | Freq: Every day | ORAL | 0 refills | Status: AC

## 2021-08-22 NOTE — Progress Notes (Signed)
ON CALL PHONE CONSULT ? ?Call from Dr. [email or web address], ED ? ?Discussion: Patient with a recent right parietotemporal ICH, presenting with worsening headaches.  Neurological exam per ED provider reassuring.  Repeat head CT scan done.  Official radiology reading concerning for small subarachnoid but on my review and upon discussion with neuroradiology, this is expected evolution of her known ICH from March. Not  a new bleed. ?Blood pressure management-on amlodipine 2.5 which should be increased to amlodipine 5 daily. ?She is on as needed Tylenol.  Can add tramadol as needed for headaches. ?Outpatient follow-up as previously advised. ?MR brain with contrast as outpatient if renal function improves. GFR currently 19. ? ?-- ?April, MD ?Neurologist ?[facility] ?Pager: [phone] ? ?

## 2021-08-22 NOTE — ED Provider Notes (Signed)
?[facility] ?Provider Note ? ? ?CSN: [id number] ?Arrival date & time: 08/22/21  1653 ? ?  ? ?History ? ?Chief Complaint  ?Patient presents with  ? Hypertension  ? ? ?Lauren Conner is a 81 y.o. female with recent subarachnoid hemorrhage presenting due to having a headache and elevated blood pressure.  Blood pressure at home was 188/100.  Patient has been complaining of a headache.  Last Tylenol at 1 PM.  Patient denies any weakness, numbness, or blurry vision.  Denies any cough, congestion, runny nose.  Patient is accompanied by 2 family members who are providing additional collateral.  They report she has been acting herself.  No recent falls. ? ? ?Hypertension ?Associated symptoms include headaches.  ? ?  ? ?Home Medications ?Prior to Admission medications   ?Medication Sig Start Date End Date Taking? Authorizing Provider  ?acetaminophen (TYLENOL) 325 MG tablet Take 2 tablets (650 mg total) by mouth every 6 (six) hours as needed. ?Patient taking differently: Take 650 mg by mouth every 6 (six) hours as needed for mild pain. 08/04/21  Yes Angiulli, Lavon Paganini, PA-C  ?albuterol (VENTOLIN HFA) 108 (90 Base) MCG/ACT inhaler Inhale 2 puffs into the lungs every 4 (four) hours as needed for wheezing. 08/04/21  Yes Angiulli, Lavon Paganini, PA-C  ?arformoterol (BROVANA) 15 MCG/2ML NEBU Take 2 mLs (15 mcg total) by nebulization 2 (two) times daily. 08/04/21  Yes Angiulli, Lavon Paganini, PA-C  ?levothyroxine (SYNTHROID) 100 MCG tablet Take 1 tablet (100 mcg total) by mouth daily at 6 (six) AM. 08/05/21  Yes Angiulli, Lavon Paganini, PA-C  ?loratadine (CLARITIN) 10 MG tablet Take 1 tablet (10 mg total) by mouth daily. 08/05/21  Yes Angiulli, Lavon Paganini, PA-C  ?midodrine (PROAMATINE) 2.5 MG tablet Take 1 tablet (2.5 mg total) by mouth 2 (two) times daily with breakfast and lunch. 08/04/21  Yes Angiulli, Lavon Paganini, PA-C  ?pantoprazole (PROTONIX) 40 MG tablet Take 1 tablet (40 mg total) by mouth daily. 08/04/21  Yes  Angiulli, Lavon Paganini, PA-C  ?senna-docusate (SENOKOT-S) 8.6-50 MG tablet Take 1 tablet by mouth 2 (two) times daily. 07/26/21  Yes Rai, Ripudeep K, MD  ?sodium bicarbonate 650 MG tablet Take 1 tablet (650 mg total) by mouth 3 (three) times daily. 08/04/21  Yes Angiulli, Lavon Paganini, PA-C  ?sodium chloride (OCEAN) 0.65 % SOLN nasal spray Place 1 spray into both nostrils as needed for congestion. ?Patient taking differently: Place 1 spray into both nostrils daily as needed for congestion. 07/26/21  Yes Rai, Ripudeep K, MD  ?tamsulosin (FLOMAX) 0.4 MG CAPS capsule Take 1 capsule (0.4 mg total) by mouth daily. 08/04/21  Yes Angiulli, Lavon Paganini, PA-C  ?topiramate (TOPAMAX) 25 MG tablet Take 1 tablet (25 mg total) by mouth at bedtime. 08/04/21  Yes Angiulli, Lavon Paganini, PA-C  ?traMADol (ULTRAM) 50 MG tablet Take 1 tablet (50 mg total) by mouth every 12 (twelve) hours as needed for up to 5 days for severe pain (Breakthrough pain). 08/22/21 08/27/21 Yes Lupita Dawn, MD  ?amLODipine (NORVASC) 2.5 MG tablet Take 2 tablets (5 mg total) by mouth daily. 08/22/21   Lupita Dawn, MD  ?polyethylene glycol (MIRALAX / GLYCOLAX) 17 g packet Take 17 g by mouth daily. ?Patient not taking: Reported on 08/22/2021 07/27/21   Rai, Vernelle Emerald, MD  ?revefenacin (YUPELRI) 175 MCG/3ML nebulizer solution Take 3 mLs (175 mcg total) by nebulization daily. ?Patient not taking: Reported on 08/22/2021 08/04/21   Cathlyn Parsons, PA-C  ?   ? ?  Allergies    ?Ethambutol and Iodinated contrast media   ? ?Review of Systems   ?Review of Systems  ?Constitutional:  Negative for chills and fever.  ?Neurological:  Positive for headaches. Negative for tremors, seizures and light-headedness.  ? ?Physical Exam ?Updated Vital Signs ?BP 119/66 (BP Location: Right Arm)   Pulse 94   Temp 97.6 ?F (36.4 ?C) (Oral)   Resp (!) 23   SpO2 97%  ?Physical Exam ?Vitals and nursing note reviewed.  ?Constitutional:   ?   General: She is not in acute distress. ?Cardiovascular:  ?   Rate and  Rhythm: Normal rate and regular rhythm.  ?Musculoskeletal:     ?   General: No swelling or tenderness.  ?Neurological:  ?   Mental Status: She is alert.  ? ?MENTAL STATUS EXAM:    ?Orientation: Alert and oriented to person, place and time.  ?Memory: Cooperative, follows commands well.  ?Language: Speech is clear and language is normal.  ? ?CRANIAL NERVES:    ?CN 2 (Optic): Visual fields intact to confrontation.  ?CN 3,4,6 (EOM): Pupils equal and reactive to light. Full extraocular eye movement without nystagmus.  ?CN 5 (Trigeminal): Facial sensation is normal, no weakness of masticatory muscles.  ?CN 7 (Facial): No facial weakness or asymmetry.  ?CN 8 (Auditory): Auditory acuity grossly normal.  ?CN 9,10 (Glossophar): The uvula is midline, the palate elevates symmetrically.  ?CN 11 (spinal access): Normal sternocleidomastoid and trapezius strength.  ?CN 12 (Hypoglossal): The tongue is midline. No atrophy or fasciculations..  ? ?MOTOR:  Muscle Strength: Strength equal in BUE and BLE. ? ?REFLEXES: No clonus ? ?COORDINATION:   Intact finger-to-nose, no tremor, no pronator drift.  ? ?SENSATION:   Intact to light touch all four extremities. ? ?GAIT: Gait normal ? ? ?ED Results / Procedures / Treatments   ?Labs ?(all labs ordered are listed, but only abnormal results are displayed) ?Labs Reviewed  ?COMPREHENSIVE METABOLIC PANEL - Abnormal; Notable for the following components:  ?    Result Value  ? CO2 21 (*)   ? BUN 45 (*)   ? Creatinine, Ser 2.56 (*)   ? Calcium 8.8 (*)   ? Total Protein 6.4 (*)   ? Albumin 3.1 (*)   ? GFR, Estimated 19 (*)   ? All other components within normal limits  ?CBC WITH DIFFERENTIAL/PLATELET - Abnormal; Notable for the following components:  ? RBC 3.75 (*)   ? Hemoglobin 11.4 (*)   ? Neutro Abs 1.5 (*)   ? All other components within normal limits  ?RESP PANEL BY RT-PCR (FLU A&B, COVID) ARPGX2  ?ETHANOL  ?RAPID URINE DRUG SCREEN, HOSP PERFORMED  ?URINALYSIS, ROUTINE W REFLEX MICROSCOPIC   ? ? ?EKG ?None ? ?Radiology ?CT Head Wo Contrast ? ?Addendum Date: 08/22/2021   ?ADDENDUM REPORT: 08/22/2021 18:50 ADDENDUM: These results were called by telephone at the time of interpretation on 08/22/2021 at 6:44 pm to provider RILEY RANSOM , who verbally acknowledged these results. Electronically Signed   By: Iven Finn M.D.   On: 08/22/2021 18:50  ? ?Result Date: 08/22/2021 ?CLINICAL DATA:  Transient ischemic attack (TIA). Hypertension. States blood pressure has been 188/100. Recently admitted with brain bleed. Pt complains of headache. EXAM: CT HEAD WITHOUT CONTRAST TECHNIQUE: Contiguous axial images were obtained from the base of the skull through the vertex without intravenous contrast. RADIATION DOSE REDUCTION: This exam was performed according to the departmental dose-optimization program which includes automated exposure control, adjustment of the mA and/or kV  according to patient size and/or use of iterative reconstruction technique. COMPARISON:  CT head 07/25/2021, MR a head 07/21/2021 FINDINGS: Brain: Patchy and confluent areas of decreased attenuation are noted throughout the deep and periventricular white matter of the cerebral hemispheres bilaterally, compatible with chronic microvascular ischemic disease. Expected interval evolution of a right temporal, parietal, occipital encephalomalacia. No evidence of large-territorial acute infarction. No parenchymal hemorrhage. No mass lesion. Hyperdense curvilinear lesion of the right parieto-occipital sulci along the encephalomalacia of prior hemorrhage suggestive acute subarachnoid hemorrhage (4: 17-22). No mass effect or midline shift. No hydrocephalus. Basilar cisterns are patent. Vascular: No hyperdense vessel. Atherosclerotic calcifications are present within the cavernous internal carotid arteries. Skull: No acute fracture or focal lesion. Sinuses/Orbits: Trace partial bilateral mastoid air cell effusions. Paranasal sinuses are clear. The orbits are  unremarkable. Other: None. IMPRESSION: Interval development of an acute subarachnoid hemorrhage along the right parietooccipital encephalomalacia. Electronically Signed: By: Iven Finn M.D. On: 08/22/2021 18:47   ? ?P

## 2021-08-22 NOTE — ED Provider Notes (Signed)
Care with Dr. Malen Gauze, on-call for neurology.  He has reviewed CT with neuroradiologist.  They feel this bleeding is normal evolution of prior subarachnoid hemorrhage. ?Patient is taking only Norvasc 2.5 mg for blood pressure. ?Cleviprex was initiated due to the certain that this was a new bleed.  In light of normal evolution, will stop Cleviprex and give Norvasc.  Plan increasing patient's Norvasc of 2.5 to 5 mg ?Additionally, patient has not been on any pain control medications.  We will add acetaminophen and tramadol. ?Anticipate discharge home ?  ?Pattricia Boss, MD ?08/25/21 1750 ? ?

## 2021-08-22 NOTE — Discharge Instructions (Addendum)
You were evaluated in the Emergency Department and after careful evaluation, we did not find any emergent condition requiring admission or further testing in the hospital. ? ?Your exam/testing today was overall reassuring. Your imaging showed an evolution of an already known bleed. Please increase your amlodipine to 5mg . Take tylenol for your headache. If not helping, can take tramadol.  ? ?Please return to the Emergency Department if you experience any worsening of your condition.  Thank you for allowing [place] to be a part of your care.  ?

## 2021-08-22 NOTE — ED Triage Notes (Signed)
Pt here with reports of hypertension. States blood pressure has been 188/100. Recently admitted with brain bleed. Pt complains of headache. No other neuro deficits noted.  ?

## 2021-08-22 NOTE — ED Provider Triage Note (Addendum)
Emergency Medicine Provider Triage Evaluation Note ? ?Lauren Conner , a 81 y.o. female  was evaluated in triage.  Pt complains of elevated blood pressure 188/100 today. She has been complaining of a headache today as well, but none now. Mentions she had a stroke and brain bleed in early March 2023. Comes with daughter in law. Denies any worsening weakness or blurry vision.  ? ?Review of Systems  ?Positive: See above ?Negative: See above ? ?Physical Exam  ?BP (!) 169/107 (BP Location: Left Arm)   Pulse 100   Temp 98.4 ?F (36.9 ?C) (Oral)   Resp 18   SpO2 100%  ?Gen:   Awake, no distress   ?Resp:  Normal effort  ?MSK:   Moves extremities without difficulty  ?Other:  Cranial nerves II-XII intact. Finger to nose intact.  ? ?Medical Decision Making  ?Medically screening exam initiated at 6:00 PM.  Appropriate orders placed.  Lauren Conner was informed that the remainder of the evaluation will be completed by another provider, this initial triage assessment does not replace that evaluation, and the importance of remaining in the ED until their evaluation is complete. ? ?Will order basic labs and head CT. ? ?ADDENDUM: Spoke with radiologist who confirmed subarachnoid hemorrhage near the encephalomalacia.  Charge nurse was alerted.  Patient was roomed immediately. Attending on this chart notified.  ?  ?Lauren Puller, PA-C ?08/22/21 1804 ? ?  ?Lauren Puller, PA-C ?08/22/21 1857 ? ?

## 2021-08-23 ENCOUNTER — Telehealth: Payer: Self-pay

## 2021-08-23 NOTE — Telephone Encounter (Signed)
Patient called in to state that the prescriptions were not printed and with her discharge paperwork. Called [facility] ( referred pharmacy) and called in medications as written  ?

## 2021-08-25 ENCOUNTER — Encounter: Payer: Medicare Other | Admitting: Speech Pathology

## 2021-08-25 ENCOUNTER — Ambulatory Visit: Payer: Medicare Other

## 2021-08-25 ENCOUNTER — Encounter: Payer: Medicare Other | Admitting: Occupational Therapy

## 2021-08-28 ENCOUNTER — Encounter: Payer: Medicare Other | Admitting: Speech Pathology

## 2021-08-28 ENCOUNTER — Encounter: Payer: Medicare Other | Admitting: Occupational Therapy

## 2021-08-28 ENCOUNTER — Ambulatory Visit: Payer: Medicare Other

## 2021-09-01 ENCOUNTER — Ambulatory Visit: Payer: Medicare Other

## 2021-09-02 ENCOUNTER — Ambulatory Visit: Payer: Medicare Other | Admitting: Occupational Therapy

## 2021-09-02 ENCOUNTER — Ambulatory Visit: Payer: Medicare Other | Admitting: Physical Therapy

## 2021-09-02 ENCOUNTER — Encounter: Payer: Self-pay | Admitting: Physical Therapy

## 2021-09-02 ENCOUNTER — Ambulatory Visit: Payer: Medicare Other | Admitting: Speech Pathology

## 2021-09-02 DIAGNOSIS — R2681 Unsteadiness on feet: Secondary | ICD-10-CM

## 2021-09-02 DIAGNOSIS — R41841 Cognitive communication deficit: Secondary | ICD-10-CM

## 2021-09-02 DIAGNOSIS — M6281 Muscle weakness (generalized): Secondary | ICD-10-CM

## 2021-09-02 DIAGNOSIS — R2689 Other abnormalities of gait and mobility: Secondary | ICD-10-CM

## 2021-09-02 DIAGNOSIS — H53462 Homonymous bilateral field defects, left side: Secondary | ICD-10-CM | POA: Diagnosis not present

## 2021-09-02 NOTE — Therapy (Signed)
?OUTPATIENT OCCUPATIONAL THERAPY TREATMENT NOTE ? ? ?Patient Name: Lauren Conner ?MRN: [id number] ?DOB:October 27, 1940, 81 y.o., female ?Today's Date: 08/08/2021 ? ?PCP: [facility] ?REFERRING PROVIDER: Dr. Naaman Plummer ? ? OT End of Session - 09/02/21 1026   ? ? Visit Number 6   ? Number of Visits 17   ? Date for OT Re-Evaluation 10/01/21   ? Authorization Type MCR, [employer]   ? Authorization - Visit Number 6   ? Authorization - Number of Visits 10   ? Progress Note Due on Visit 10   ? OT Start Time 1025   pt arrived late  ? OT Stop Time 1100   ? OT Time Calculation (min) 35 min   ? Activity Tolerance Patient tolerated treatment well   ? Behavior During Therapy [facility] for tasks assessed/performed   ? ?  ?  ? ?  ? ? ? ?  ? ? ? ?Patient Active Problem List  ? Diagnosis Date Noted  ? 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 Bronchiectasis with acute exacerbation ([place])   ? Encephalopathy acute 07/21/2021  ? Intracerebral hemorrhage 07/21/2021  ? Acute renal failure ([place])   ? Acute urinary retention 07/20/2021  ? Bilateral hydronephrosis 07/20/2021  ? Osteoporosis without current pathological fracture 07/20/2021  ? CKD (chronic kidney disease) stage 4, GFR 15-29 ml/min ([facility]) 07/19/2021  ? Paroxysmal SVT (supraventricular tachycardia) ([place]) 07/19/2021  ? Severe sepsis ([place]) 07/19/2021  ? Pneumonia of both lungs due to infectious organism 07/19/2021  ? Acute metabolic encephalopathy 02/77/4128  ? Anemia of chronic kidney failure, stage 4 (severe) ([place]) 07/19/2021  ? Metabolic acidosis 78/67/6720  ? Hyponatremia 07/19/2021  ? Essential hypertension 11/17/2018  ? Hypothyroidism (acquired) 12/06/2015  ? Adult bronchiectasis ([place]) 03/25/2011  ? ? ?ONSET DATE: 07/19/21 ? ?REFERRING DIAG: I61.9 (ICD-10-CM) - Intraparenchymal hemorrhage of brain ([place])  ? ?THERAPY DIAG:  ?Left homonymous hemianopsia -  ?Unsteadiness on feet - ?Muscle weakness (generalized) -  ?Other symptoms and signs involving cognitive functions  following cerebral infarction  ? ?SUBJECTIVE:  ? ?SUBJECTIVE STATEMENT: ?They doctor at [facility] took me off one BP medicine (midodrine) and placed me on a beta blocker ? ? ?PERTINENT HISTORY: Lauren Conner is a 81 y.o. right-handed female with history of anemia of chronic disease, paroxysmal SVT hypothyroidism hypertension CKD stage IV followed by Dr.Lehrich with nephrology at [facility] with creatinine baseline 2.0.  ? ?PRECAUTIONS: Fall and Other: no driving, abdominal binder d/t orthostatic hypotension, foley catheter, and Lt visual field cut ? ?WEIGHT BEARING RESTRICTIONS No ? ? ? ?PAIN:  ?Are you having pain? No ? ? ? ? ?OBJECTIVE:  ?TODAY'S TREATMENT:  ? ?Visual perceptual skills: flower match game - first design with max cueing for attention to detail, choosing correct pieces. 2nd design w/ less cueing overall (approx min cueing needed), extra time required.  ? ?BP = 109/66, pulse 81. This O.T. recommended P.T. check again as pt sees P.T. later ?  ? ?08/20/21: PATIENT EDUCATION: ?Education details: HEP for BUE's for UB endurance ?Person educated: Patient ?Education method: Explanation, Demonstration, Verbal cues, and Handouts ?Education comprehension: verbalized understanding, returned demonstration, and verbal cues required ? ? ?08/19/21: PATIENT EDUCATION: ?Education details: memory strategies ?Person educated: Patient ?Education method: Explanation, Verbal cues, and Handouts ?Education comprehension: verbalized understanding and needs further education ? ? ? ? ?GOALS: ? ? ?SHORT TERM GOALS: Target date: 09/03/2021 ? ?Pt will be independent with HEP targeting overall endurance ? ?Baseline: n/a ?Goal status: ONGOING (issued  08/20/21) ? ?2.  Pt to verbalize understanding with visual scanning strategies ?Baseline: dep ?Goal status: ONGOING (handout issued 08/08/21) ? ?3.  Pt will perform basic snack prep w/ supervision  ?Baseline: dep ?Goal status: INITIAL ? ?4.  Pt to perform tabletop scanning attending to Lt  side 75% or greater ?Baseline: dep ?Goal status: ONGOING ? ?5.  Pt to verbalize understanding with memory compensatory strategies ?Baseline: dep ?Goal status: ONGOING (handout issued 08/19/21) ? ? ? ?LONG TERM GOALS: Target date: 10/01/2021 ? ?Pt will perform simple stovetop cooking task and light housekeeping at mod I and good safety awareness ?Baseline: dep ?Goal status: INITIAL ? ?2.  Pt will perform environmental scanning with 85% accuracy in a min distracting environment.   ?Baseline: dep ?Goal status: INITIAL ? ?3.  Pt to perform BADLS I'ly  ?Baseline: min assist for shower transfers and dressing ?Goal status: INITIAL ? ?4.  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 ?5.  Pt to perform dynamic standing tasks safely w/o LOB ?Baseline: unsteadiness ?Goal status: INITIAL ? ? ?ASSESSMENT: ? ?CLINICAL IMPRESSION: ?Pt recently saw MD who changes BP meds - lower BP today. Pt progressing with visual perceptual skills and tabletop scanning ? ?PERFORMANCE DEFICITS in functional skills including ADLs, IADLs, sensation, strength, FMC, mobility, balance, endurance, decreased knowledge of precautions, decreased knowledge of use of DME, vision, and UE functional use, cognitive skills including attention, memory, perception, problem solving, safety awareness, and sequencing,  ? ?IMPAIRMENTS are limiting patient from ADLs, IADLs, and social participation.  ? ?COMORBIDITIES may have co-morbidities  that affects occupational performance. Patient will benefit from skilled OT to address above impairments and improve overall function. ? ?MODIFICATION OR ASSISTANCE TO COMPLETE EVALUATION: Min-Moderate modification of tasks or assist with assess necessary to complete an evaluation. ? ?OT OCCUPATIONAL PROFILE AND HISTORY: Detailed assessment: Review of records and additional review of physical, cognitive, psychosocial history  related to current functional performance. ? ?CLINICAL DECISION MAKING: Moderate - several treatment options, min-mod task modification necessary ? ?REHAB POTENTIAL: Good ? ?EVALUATION COMPLEXITY: Moderate ? ? ? ?PLAN: ?OT FREQUENCY: 2x/week ? ?OT DURATION: 8 weeks ? ?PLANNED INTERVENTIONS: self care/ADL training, therapeutic exercise, therapeutic activity, neuromuscular re-education, manual therapy, functional mobility training, patient/family education, cognitive remediation/compensation, visual/perceptual remediation/compensation, coping strategies training, and DME and/or AE instructions ? ?RECOMMENDED OTHER SERVICES: n/a ? ?CONSULTED AND AGREED WITH PLAN OF CARE: Patient  ? ?PLAN FOR NEXT SESSION:  begin assessing STG's (monitor BP), environmental scanning in gym ? ? ?Redmond Baseman, OTR/L ?09/02/21 10:27 AM ?Phone [phone] ?FAX [phone] ? ? ?

## 2021-09-02 NOTE — Patient Instructions (Signed)
Buy a notebook to use as a Careers information officer book for managing your finances ? ?You can keep your receipts in the front of the book until you are able to write them in.  ? ?If you attempt to balance your checkbook again before next session: ?Use a clean sheet of paper, not your scratched out check book--- it's messy and likely not helping ?Use check marks to mark off what you've calculated ?Remember that you don't have to hit equal button between each number, you DO have to make sure you hit + or - ? ?Using Your Calendar: ?Doristine Devoid job using your calendar to keep track of your schedule! Keep it up! ? ?My number one tip is to not delay, always write in new appointments as they come up. Refer to calendar every morning to know what's going on.  ?

## 2021-09-02 NOTE — Therapy (Signed)
?OUTPATIENT PHYSICAL THERAPY TREATMENT NOTE ? ? ?Patient Name: Lauren Conner ?MRN: [id number] ?DOB:10-17-1940, 81 y.o., female ?Today's Date: 09/03/2021 ? ?PCP: [facility] ?REFERRING PROVIDER: Cathlyn Parsons, PA-C  ? ? PT End of Session - 09/02/21 1319   ? ? Visit Number 6   ? Number of Visits 17   ? Date for PT Re-Evaluation 10/29/21   ? Authorization Type Medicare A & B   ? Progress Note Due on Visit 10   ? PT Start Time 1316   ? PT Stop Time 1400   ? PT Time Calculation (min) 44 min   ? Equipment Utilized During Treatment Gait belt;Other (comment)   ? Activity Tolerance Patient tolerated treatment well   ? Behavior During Therapy [facility] for tasks assessed/performed   ? ?  ?  ? ?  ? ? ? ?Past Medical History:  ?Diagnosis Date  ? Hypertension   ? Renal disorder   ? ?History reviewed. No pertinent surgical history. ?Patient Active Problem List  ? Diagnosis Date Noted  ? 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 Bronchiectasis with acute exacerbation ([place])   ? Encephalopathy acute 07/21/2021  ? Intracerebral hemorrhage 07/21/2021  ? Acute renal failure ([place])   ? Acute urinary retention 07/20/2021  ? Bilateral hydronephrosis 07/20/2021  ? Osteoporosis without current pathological fracture 07/20/2021  ? CKD (chronic kidney disease) stage 4, GFR 15-29 ml/min (HCC) 07/19/2021  ? Paroxysmal SVT (supraventricular tachycardia) ([place]) 07/19/2021  ? Severe sepsis ([place]) 07/19/2021  ? Pneumonia of both lungs due to infectious organism 07/19/2021  ? Acute metabolic encephalopathy 83/38/2505  ? Anemia of chronic kidney failure, stage 4 (severe) ([place]) 07/19/2021  ? Metabolic acidosis 39/76/7341  ? Hyponatremia 07/19/2021  ? Essential hypertension 11/17/2018  ? Hypothyroidism (acquired) 12/06/2015  ? Adult bronchiectasis ([place]) 03/25/2011  ? ? ?REFERRING DIAG: I61.9 (ICD-10-CM) - Intraparenchymal hemorrhage of brain ([place])  ? ?THERAPY DIAG:  ?Unsteadiness on feet ? ?Muscle weakness (generalized) ? ?Other  abnormalities of gait and mobility ? ?PERTINENT HISTORY: anemia of chronic disease, paroxysmal SVT hypothyroidism hypertension CKD stage IV  ? ?PRECAUTIONS: Fall and orthostatic. L homonymous hemianopsia  ? ?SUBJECTIVE: Pt states ED MD removed 2 meds and replaced them with 2 others, but pt cannot recall which ones.  She will bring bottles to next appt.  She inquires about weeding flower beds and walking without walker. ? ?PAIN:  ?Are you having pain? No ? ?TODAY'S TREATMENT: ?PT answers questions about weeding rock beds and walking without walker. ?Assess 10MWT = 9.66sec = 3.42 ft/sec ?Assess BERG: ? [facility] PT Assessment - 09/02/21 1334   ? ?  ? Berg Balance Test  ? Sit to Stand Able to stand without using hands and stabilize independently   ? Standing Unsupported Able to stand safely 2 minutes   ? Sitting with Back Unsupported but Feet Supported on Floor or Stool Able to sit safely and securely 2 minutes   ? Stand to Sit Sits safely with minimal use of hands   ? Transfers Able to transfer safely, minor use of hands   ? Standing Unsupported with Eyes Closed Able to stand 10 seconds safely   ? Standing Unsupported with Feet Together Able to place feet together independently and stand 1 minute safely   ? From Standing, Reach Forward with Outstretched Arm Can reach forward >12 cm safely (5")   ? From Standing Position, Pick up Object from [place] to pick up shoe safely and easily   ?  From Standing Position, Turn to Look Behind Over each Shoulder Looks behind from both sides and weight shifts well   ? Turn 360 Degrees Able to turn 360 degrees safely but slowly   ? Standing Unsupported, Alternately Place Feet on Step/Stool Able to stand independently and safely and complete 8 steps in 20 seconds   ? Standing Unsupported, One Foot in [employer] balance while stepping or standing   ? Standing on One Leg Tries to lift leg/unable to hold 3 seconds but remains standing independently   ? Total Score 46   ? Berg comment:  significant fall risk   ? ?  ?  ? ?  ? ?Assessed 30sec chair stand, pt completes 10 reps w/o UE support from standard chair ? ?Pt ambulates on level surface w/o RW completing 460' w/ mild deviation left of path, cued to maintain focus in blue path with improvement in deviations.  No overt LOB.  Pt supervision level.   ? ?VITALS:  ?Vitals taken in LUE while seated prior to activity 148/80; abdominal binder and ted hose doffed at home prior to PT. ? End of session:  142/80, pt expresses fatigue, but no lightheadedness or dizziness ? ? ? ?OBJECTIVE:  ?  ?DIAGNOSTIC FINDINGS: Cranial CT scan showed large area of acute intraparenchymal hemorrhage within the right temporoparietal lobe 4.0 x 4.0 x 4.1 cm with surrounding vasogenic edema with mild effacement of the occipital horn and atrium of the right lateral ventricle.  Small amount of subarachnoid hemorrhage within [place] at the anterior right temporal lobe.  3 mm right to left midline shift.  ?  ?COGNITION: ?Overall cognitive status: Impaired: Memory: Deficits impaired STM ?Awareness: Deficits impaired safety awareness ?Problem solving: Deficits poor executive reasoning  ?Behavior: Impulsive  ?  ?PATIENT EDUCATION: ?Education details: Edu on not weeding flower beds without supervision after we further assess her ability to do so next session.  Edu on further assessment of gait without RW and safety without it as pt ambulates into clinic without AD.  Continue checking BP to monitor effects of new meds to report to MD. ?Person educated: Patient  ?Education method: Explanation, Demonstration, and Verbal cues ?Education comprehension: verbalized understanding and needs further education ?  ?HOME EXERCISE PROGRAM: ?Access Code: [email or web address] ?URL: [email or web address] ?Date: 08/08/2021 ?Prepared by: Mickie Bail Plaster ? ?Exercises ?- Sit to Stand with Resistance Around Legs  - 1 x daily - 7 x weekly - 3 sets - 10 reps ?- Heel to toe walking   - 1 x daily - 7 x weekly -  3 sets - 10 reps ?- Side Stepping with Resistance at Thighs and Counter Support  - 1 x daily - 7 x weekly - 3 sets - 10 reps ?- Standing March with Counter Support  - 1 x daily - 7 x weekly - 3 sets - 10 reps - 2 second hold ?  ?GOALS: ?Goals reviewed with patient? Yes ?  ?SHORT TERM GOALS: Target date: 09/03/2021 ?  ?Pt will perform initial HEP with S* for improved strength, balance, transfers and gait. ?Baseline: established 08/08/2021 ?Goal status: MET ?  ?2.  Pt will improve gait velocity to at least 2.3 ft/s with LRAD and S* for improved gait efficiency and functional mobility ?Baseline: 1.77 ft/s with rollator w/CGA; 09/02/2021 3.42 ft/sec no AD supervision ?Goal status: MET ?  ?3.  Pt will improve Berg score to 42/56 for decreased fall risk ? ?Baseline: 35/56; 09/02/2021 46/56 ?Goal status: MET ?  ?4.  Pt will  improve 30s STS to 11 repetitions or greater without BUE support to demonstrate improved functional strength and transfer efficiency.   ?Baseline: 8 reps without BUE support; 10 reps w/o UE support ?Goal status: PARTIALLY MET ?  ?  ?LONG TERM GOALS: Target date: 10/01/2021 ?  ?Pt will ascend/descend 4 steps using step-to pattern and LRAD mod I to imitate home environment  ?Baseline:  ?Goal status: INITIAL ?  ?2.  Pt will improve normal TUG to less than or equal to 15 seconds w/LRAD mod I for improved functional mobility and decreased fall risk. ?  ?Baseline: 22.17s w/rollator and CGA ?Goal status: INITIAL ?  ?3.  Pt will improve gait velocity to at least 2.7 ft/s with LRAD mod I for improved gait efficiency and performance at community ambulator level  ?  ?Baseline: 1.77 ft/s with rollator and CGA ?Goal status: INITIAL ?  ?4.  Pt will improve Berg score to 50/56 for decreased fall risk ? ?Baseline: 35/56 ?Goal status: INITIAL ?  ?  ?ASSESSMENT: ?  ?CLINICAL IMPRESSION: ?BP did drop following skilled session with pt not wearing abdominal binder or TED hose, but drop was within normal limits, pt not  orthostatic and was asymptomatic throughout session.  She demonstrating improved tolerance to ambulation during session.  Her BERG score increased from 35/56 to 46/56 and her gait speed without use of and AD improved

## 2021-09-02 NOTE — Therapy (Signed)
?OUTPATIENT SPEECH LANGUAGE PATHOLOGY TREATMENT NOTE ? ? ?Patient Name: Lauren Conner ?MRN: [id number] ?DOB:1940-12-27, 81 y.o., female ?Today's Date: 09/02/2021 ? ?[facility]: [facility] ?REFERRING PROVIDER: Cathlyn Parsons, PA-C  ? ? End of Session - 09/02/21 1207   ? ? Visit Number 7   ? Number of Visits 17   ? Date for SLP Re-Evaluation 10/29/21   ? Authorization Type Medicare   ? SLP Start Time 1230   ? SLP Stop Time  1311   ? SLP Time Calculation (min) 41 min   ? Activity Tolerance Patient tolerated treatment well   ? ?  ?  ? ?  ? ? ? ? ?Past Medical History:  ?Diagnosis Date  ? Hypertension   ? Renal disorder   ? ?No past surgical history on file. ?Patient Active Problem List  ? Diagnosis Date Noted  ? 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 Bronchiectasis with acute exacerbation ([place])   ? Encephalopathy acute 07/21/2021  ? Intracerebral hemorrhage 07/21/2021  ? Acute renal failure ([place])   ? Acute urinary retention 07/20/2021  ? Bilateral hydronephrosis 07/20/2021  ? Osteoporosis without current pathological fracture 07/20/2021  ? CKD (chronic kidney disease) stage 4, GFR 15-29 ml/min (HCC) 07/19/2021  ? Paroxysmal SVT (supraventricular tachycardia) ([place]) 07/19/2021  ? Severe sepsis ([place]) 07/19/2021  ? Pneumonia of both lungs due to infectious organism 07/19/2021  ? Acute metabolic encephalopathy 78/29/5621  ? Anemia of chronic kidney failure, stage 4 (severe) ([place]) 07/19/2021  ? Metabolic acidosis 30/86/5784  ? Hyponatremia 07/19/2021  ? Essential hypertension 11/17/2018  ? Hypothyroidism (acquired) 12/06/2015  ? Adult bronchiectasis ([place]) 03/25/2011  ? ? ?ONSET DATE: 07/19/2021 ? ?REFERRING DIAG: I61.9 (ICD-10-CM) - Intraparenchymal hemorrhage of brain ([place])  ? ?THERAPY DIAG:  ?Cognitive communication deficit ? ?SUBJECTIVE: "I've been in the ER 2x since I saw you last. I had high blood pressure."  ? ?PAIN:  ?Are you having pain? No ? ?OBJECTIVE:  ? ?TODAY'S TREATMENT: ?09-02-21: Pt  reports she has not been sorting her medications. Tells ST "I've got to get back to doing it." ST provided re-education on medication management compensations. Pt will attempt to sort pills with DIL supervision this weekend. Reassured pt of her previous success doing this task in therapy. Target financial management. Provide education on use of compensations (writing out, checking off, managing calc) which can enable successful manipulation of financial figures. Using compensations, pt able to complete simple financial balancing activity with 100% accuracy with rare mod-A for calculator usage (knowing which button to hit) and which # she is on. Will plan to assist pt in balancing own checkbook next session, pt to bring receipts.   ? ?08-20-21: Reviewed calendar usage as tool to aid in memory and orientation. Recommend that pt consolidate information from her personal planner which she was using prior to stroke onto large calendar to have all information in one place. Completed problem solving tasks involving medication management. Generated solution to real life scenario in which her pharmacy is out of one of her medications. Helped pt generate a script to use when calling pharmacy to see which medication is was and when it would be expected to be refilled. Provided written sequence for deciding if she needed to have prescription sent to another pharmacy.  Max-A required for successful completion of functional problem solving. Using guiding questions, pt appears to question her responses, and often telling ST "I don't know." Pt able to verbalize x4 medication management strategies with mod  I (use medication box, keep where she can see, use delivery service, take pills at same time daily).  ?  ?PATIENT EDUCATION: ?Education details: see above ?Person educated: Patient  ?Education method: Explanation, Demonstration, and Handout ?Education comprehension: verbalized understanding, returned demonstration, and needs further  education ?  ?  ?GOALS: ?Goals reviewed with patient? Yes ?  ?SHORT TERM GOALS: Target date: 09/03/2021 ?  ?Pt will complete CLQT and PROM within first 2 sessions. ?Baseline: initiated ?Goal status: GOAL MET  ? ?2.  Pt will accurately sort medications into weekly pill box with occasional min A over 2 sessions.  ?Baseline: medications being managed by care partner ?Goal status: ONGOING ?  ?3.  Pt will report successful implementation at home of x2 attention/memory compensations with occasional mod A over 2 sessions.  ?Baseline: reports difficulty focusing; impaired memory observed in session ?Goal status: ONGOING ?  ?4.  Pt will demonstrate adeuqate organizational skills to complete mod complex therapy tasks with usual mod A over 2 sessions  ?Baseline: attention, ex fx skills inhibiting successful completion of tasks ?Goal status: ONGOING ?  ?5.  Pt will report increased household participation in x2 desired activities (shopping, meal prep, medication/financial management) with supervision from family members   ?Baseline: requires total A for ADLs and IADLS ?Goal status: ONGOING ?  ?  ?LONG TERM GOALS: Target date: 10/29/2021 ?  ?Pt will successfully manage household activities using external aids (to-do list, schedule, etc) with rare min A over 1 weeks  ?Baseline: total A for all ADLs ?Goal status: ONGOING ?  ?2.  Pt will successfully implement a bill paying system with rare min A over 1 week period to promote independence with managing finances ?Baseline: requires total A ?Goal status: ONGOING ?  ?3.  Pt will demonstrate adeuqate organizational skills to complete mod complex therapy tasks with modified independence (compensations) in 2 sessions  ?Baseline: attention, ex fx skills inhibiting successful completion of tasks ?Goal status: ONGOING ? ?4. Pt will demonstrate 5 pt improvement on Cognitive Fx - Abilities PROM by d/c.  ?Baseline: 89 ?Goal Status: ONGOING ?  ?  ?ASSESSMENT: ?  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 Pt presents with impairments in domains of executive function, attention, memory, and problem solving. Pt reports difficulty with simple every day tasks such as using remote or cell phone. Demonstrates intellectual and emergent awareness which causes frustration for pt. Demonstrates poor frustration tolerance but is able to reengage given encouragement from ST in session. Requires total A for ADLs and IADLs at this time, prior to acute event pt was fully independent. Pt desires to return to living independently. Pt stated goals are to return to managing finances, managing medications, taking care of home to include household chores, and meal planning and grocery shopping for self. ?  ?OBJECTIVE IMPAIRMENTS include attention, memory, and executive functioning. These impairments are limiting patient from managing medications, managing appointments, managing finances, household responsibilities, and ADLs/IADLs. ?Patient will benefit from skilled SLP services to address above impairments and improve overall function. ?  ?REHAB POTENTIAL: Good ?  ?PLAN: ?SLP FREQUENCY: 2x/week ?  ?SLP DURATION: 12 weeks ?  ?PLANNED INTERVENTIONS: Environmental controls, Cueing hierachy, Cognitive reorganization, Internal/external aids, Functional tasks, SLP instruction and feedback, and Compensatory strategies ?  ?Erabella Kuipers L. Zissy Hamlett CCC-SLP ?09/02/2021, 12:09 PM ?  ?

## 2021-09-03 ENCOUNTER — Encounter: Payer: Medicare Other | Admitting: Speech Pathology

## 2021-09-03 ENCOUNTER — Ambulatory Visit: Payer: Medicare Other

## 2021-09-04 ENCOUNTER — Ambulatory Visit: Payer: Medicare Other | Admitting: Occupational Therapy

## 2021-09-04 ENCOUNTER — Encounter: Payer: Self-pay | Admitting: Physical Therapy

## 2021-09-04 ENCOUNTER — Ambulatory Visit: Payer: Medicare Other | Admitting: Physical Therapy

## 2021-09-04 ENCOUNTER — Ambulatory Visit: Payer: Medicare Other | Admitting: Speech Pathology

## 2021-09-04 DIAGNOSIS — R41841 Cognitive communication deficit: Secondary | ICD-10-CM

## 2021-09-04 DIAGNOSIS — R2681 Unsteadiness on feet: Secondary | ICD-10-CM

## 2021-09-04 DIAGNOSIS — I69318 Other symptoms and signs involving cognitive functions following cerebral infarction: Secondary | ICD-10-CM

## 2021-09-04 DIAGNOSIS — M6281 Muscle weakness (generalized): Secondary | ICD-10-CM

## 2021-09-04 DIAGNOSIS — R2689 Other abnormalities of gait and mobility: Secondary | ICD-10-CM

## 2021-09-04 DIAGNOSIS — H53462 Homonymous bilateral field defects, left side: Secondary | ICD-10-CM

## 2021-09-04 NOTE — Therapy (Signed)
?OUTPATIENT OCCUPATIONAL THERAPY TREATMENT NOTE ? ? ?Patient Name: Lauren Conner ?MRN: [id number] ?DOB:February 01, 1941, 81 y.o., female ?Today's Date: 08/08/2021 ? ?PCP: [facility] ?REFERRING PROVIDER: Dr. Naaman Plummer ? ? OT End of Session - 09/04/21 1030   ? ? Visit Number 7   ? Number of Visits 17   ? Date for OT Re-Evaluation 10/01/21   ? Authorization Type MCR, [employer]   ? Authorization - Visit Number 7   ? Authorization - Number of Visits 10   ? Progress Note Due on Visit 10   ? OT Start Time 1104   ? OT Stop Time 1144   ? OT Time Calculation (min) 40 min   ? Activity Tolerance Patient tolerated treatment well   ? Behavior During Therapy [facility] for tasks assessed/performed   ? ?  ?  ? ?  ? ? ? ? ?  ? ? ? ?Patient Active Problem List  ? Diagnosis Date Noted  ? 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 Bronchiectasis with acute exacerbation ([place])   ? Encephalopathy acute 07/21/2021  ? Intracerebral hemorrhage 07/21/2021  ? Acute renal failure ([place])   ? Acute urinary retention 07/20/2021  ? Bilateral hydronephrosis 07/20/2021  ? Osteoporosis without current pathological fracture 07/20/2021  ? CKD (chronic kidney disease) stage 4, GFR 15-29 ml/min (HCC) 07/19/2021  ? Paroxysmal SVT (supraventricular tachycardia) ([place]) 07/19/2021  ? Severe sepsis ([place]) 07/19/2021  ? Pneumonia of both lungs due to infectious organism 07/19/2021  ? Acute metabolic encephalopathy 26/33/3545  ? Anemia of chronic kidney failure, stage 4 (severe) ([place]) 07/19/2021  ? Metabolic acidosis 62/56/3893  ? Hyponatremia 07/19/2021  ? Essential hypertension 11/17/2018  ? Hypothyroidism (acquired) 12/06/2015  ? Adult bronchiectasis ([place]) 03/25/2011  ? ? ?ONSET DATE: 07/19/21 ? ?REFERRING DIAG: I61.9 (ICD-10-CM) - Intraparenchymal hemorrhage of brain ([place])  ? ?THERAPY DIAG:  ?Left homonymous hemianopsia -  ?Unsteadiness on feet - ?Muscle weakness (generalized) -  ?Other symptoms and signs involving cognitive functions following cerebral  infarction  ? ?SUBJECTIVE:  ? ?SUBJECTIVE STATEMENT:Pt reports she checked her BP at home this morning and it was ok. Pt states her BP has been fine since they changed her meds  ? ? ?PERTINENT HISTORY: Lauren Conner is a 81 y.o. right-handed female with history of anemia of chronic disease, paroxysmal SVT hypothyroidism hypertension CKD stage IV followed by Dr.Lehrich with nephrology at [facility] with creatinine baseline 2.0.  ? ?PRECAUTIONS: Fall and Other: no driving, abdominal binder d/t orthostatic hypotension, foley catheter, and Lt visual field cut ? ?WEIGHT BEARING RESTRICTIONS No ? ? ? ?PAIN:  ?Are you having pain? No ? ? ? ? ?OBJECTIVE:  ?TODAY'S TREATMENT:  ? ?Visual perceptual skills: Clock matching task, min v.c for use of organized scan pattern, pt demonstrates good overall success.   ?Pt scanning and touching each clock to ensure she is not missing items. ?Pt only required additional v.c to locate 2 items,  Increased time required. ?Reviewed visual scanning strategies and memory compensations with patient. ?Pt would like to cook at home. Therapist recommends pt tries in therapy first. ? ? ?  ? ? ? ? ? ? ? ? ?08/20/21: PATIENT EDUCATION: ?Education details: HEP for BUE's for UB endurance ?Person educated: Patient ?Education method: Explanation, Demonstration, Verbal cues, and Handouts ?Education comprehension: verbalized understanding, returned demonstration, and verbal cues required ? ? ?08/19/21: PATIENT EDUCATION:  ?Education details: memory strategies ?Person educated: Patient ?Education method: Explanation, Verbal cues, and Handouts ?Education comprehension: verbalized  understanding and needs further education ? ? ? ? ?GOALS: ? ? ?SHORT TERM GOALS: Target date: 09/03/2021 ? ?Pt will be independent with HEP targeting overall endurance ? ?Baseline: n/a ?Goal status: ONGOING (issued 08/20/21) ? ?2.  Pt to verbalize understanding with visual scanning strategies ?Baseline: dep ?Goal status: ONGOING  (handout issued 08/08/21) ? ?3.  Pt will perform basic snack prep w/ supervision  ?Baseline: dep ?Goal status: met pt reports getting fruit and making cereal ? ?4.  Pt to perform tabletop scanning attending to Lt side 75% or greater ?Baseline: dep ?Goal status: ONGOING ? ?5.  Pt to verbalize understanding with memory compensatory strategies ?Baseline: dep ?Goal status: met, pt verbalizes understanding ? ? ? ?LONG TERM GOALS: Target date: 10/01/2021 ? ?Pt will perform simple stovetop cooking task and light housekeeping at mod I and good safety awareness ?Baseline: dep ?Goal status: INITIAL ? ?2.  Pt will perform environmental scanning with 85% accuracy in a min distracting environment.   ?Baseline: dep ?Goal status: INITIAL ? ?3.  Pt to perform BADLS I'ly  ?Baseline: min assist for shower transfers and dressing ?Goal status: INITIAL ? ?4.  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 ?5.  Pt to perform dynamic standing tasks safely w/o LOB ?Baseline: unsteadiness ?Goal status: INITIAL ? ? ?ASSESSMENT: ? ?CLINICAL IMPRESSION: ?Pt recently saw MD who changes BP meds - lower BP today. Pt progressing with visual perceptual skills and tabletop scanning ? ?PERFORMANCE DEFICITS in functional skills including ADLs, IADLs, sensation, strength, FMC, mobility, balance, endurance, decreased knowledge of precautions, decreased knowledge of use of DME, vision, and UE functional use, cognitive skills including attention, memory, perception, problem solving, safety awareness, and sequencing,  ? ?IMPAIRMENTS are limiting patient from ADLs, IADLs, and social participation.  ? ?COMORBIDITIES may have co-morbidities  that affects occupational performance. Patient will benefit from skilled OT to address above impairments and improve overall function. ? ?MODIFICATION OR ASSISTANCE TO COMPLETE EVALUATION: Min-Moderate modification of  tasks or assist with assess necessary to complete an evaluation. ? ?OT OCCUPATIONAL PROFILE AND HISTORY: Detailed assessment: Review of records and additional review of physical, cognitive, psychosocial history related to current functional performance. ? ?CLINICAL DECISION MAKING: Moderate - several treatment options, min-mod task modification necessary ? ?REHAB POTENTIAL: Good ? ?EVALUATION COMPLEXITY: Moderate ? ? ? ?PLAN: ?OT FREQUENCY: 2x/week ? ?OT DURATION: 8 weeks ? ?PLANNED INTERVENTIONS: self care/ADL training, therapeutic exercise, therapeutic activity, neuromuscular re-education, manual therapy, functional mobility training, patient/family education, cognitive remediation/compensation, visual/perceptual remediation/compensation, coping strategies training, and DME and/or AE instructions ? ?RECOMMENDED OTHER SERVICES: n/a ? ?CONSULTED AND AGREED WITH PLAN OF CARE: Patient  ? ?PLAN FOR NEXT SESSION:  check STG's , simple cooking task as pt reports she would like to do at home, environmental scanning in gym, monitor BP prn ? ? ?Theone Murdoch, OTR/L ?[phone] ?Phone: [phone] ?1:06 PM 09/04/21  ?

## 2021-09-04 NOTE — Therapy (Signed)
?OUTPATIENT PHYSICAL THERAPY TREATMENT NOTE ? ? ?Patient Name: Lauren Conner ?MRN: [id number] ?DOB:Jun 06, 1940, 81 y.o., female ?Today's Date: 09/04/2021 ? ?PCP: [facility] ?REFERRING PROVIDER: Cathlyn Parsons, PA-C  ? ? PT End of Session - 09/04/21 1150   ? ? Visit Number 7   ? Number of Visits 17   ? Date for PT Re-Evaluation 10/29/21   ? Authorization Type Medicare A & B   ? Progress Note Due on Visit 10   ? PT Start Time 1149   ? PT Stop Time 1230   ? PT Time Calculation (min) 41 min   ? Equipment Utilized During Treatment Gait belt;Other (comment)   ? Activity Tolerance Patient tolerated treatment well   ? Behavior During Therapy [facility] for tasks assessed/performed   ? ?  ?  ? ?  ? ? ? ?Past Medical History:  ?Diagnosis Date  ? Hypertension   ? Renal disorder   ? ?History reviewed. No pertinent surgical history. ?Patient Active Problem List  ? Diagnosis Date Noted  ? 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 Bronchiectasis with acute exacerbation ([place])   ? Encephalopathy acute 07/21/2021  ? Intracerebral hemorrhage 07/21/2021  ? Acute renal failure ([place])   ? Acute urinary retention 07/20/2021  ? Bilateral hydronephrosis 07/20/2021  ? Osteoporosis without current pathological fracture 07/20/2021  ? CKD (chronic kidney disease) stage 4, GFR 15-29 ml/min (HCC) 07/19/2021  ? Paroxysmal SVT (supraventricular tachycardia) ([place]) 07/19/2021  ? Severe sepsis ([place]) 07/19/2021  ? Pneumonia of both lungs due to infectious organism 07/19/2021  ? Acute metabolic encephalopathy 40/98/1191  ? Anemia of chronic kidney failure, stage 4 (severe) ([place]) 07/19/2021  ? Metabolic acidosis 47/82/9562  ? Hyponatremia 07/19/2021  ? Essential hypertension 11/17/2018  ? Hypothyroidism (acquired) 12/06/2015  ? Adult bronchiectasis ([place]) 03/25/2011  ? ? ?REFERRING DIAG: I61.9 (ICD-10-CM) - Intraparenchymal hemorrhage of brain ([place])  ? ?THERAPY DIAG:  ?Unsteadiness on feet ? ?Muscle weakness (generalized) ? ?Other  abnormalities of gait and mobility ? ?PERTINENT HISTORY: anemia of chronic disease, paroxysmal SVT hypothyroidism hypertension CKD stage IV  ? ?PRECAUTIONS: Fall and orthostatic. L homonymous hemianopsia  ? ?SUBJECTIVE: Pt states she has started taking new med (propanolol 10 mg in updated meds list) as prescribed by MD.  She is checking BP at home every day.  She has not tried weeding her flowers yet.  No falls.  ? ?PAIN:  ?Are you having pain? No ?VITALS:  ?Vitals taken in LUE while seated prior to activity 144/82; pt not wearing ted hose or abdominal binder. ? Following standing cone activity:  160/74 ?End of session following 3 mins seated quiet rest:  142/80 ?TODAY'S TREATMENT: ?Floor recovery demo'd by PT and return demo from pt w/ minA and inc time to return to standing from half kneeling using mat for UE support.  Discussed need for assistance from son for safety to ensure she can return to standing when in flower bed weeding flowers and to prevent fall due to potential orthostasis. ?Pt stands on blue mat surface touching cones in semi-circle on floor to simulate standing to weed flowers vs getting on ground, BP assessed following without orthostatic vitals present, mild drop in diastolic BP. ?Squats x12 and heel raises x12 firm surface ?Squats and heel raises x12 each on blue mat surface, pt uses wide BOS requiring CGA and min facilitation for forward weight shift into toes. ?Pt ambulates on level surface 345' naming animals requiring intermittent CGA and min cues to  navigate obstacles on left side.  Continued ambulation x115' on level surface with intermittent CGA with pt turning head left and right naming objects on that side> 115' w/ pt turning and passing ball over left shoulder and PT returning over right shoulder.  Pt has one major deviation to left side w/o overt LOB during ball passing task. ? ? ? ? ?OBJECTIVE:  ?  ?DIAGNOSTIC FINDINGS: Cranial CT scan showed large area of acute intraparenchymal  hemorrhage within the right temporoparietal lobe 4.0 x 4.0 x 4.1 cm with surrounding vasogenic edema with mild effacement of the occipital horn and atrium of the right lateral ventricle.  Small amount of subarachnoid hemorrhage within [place] at the anterior right temporal lobe.  3 mm right to left midline shift.  ?  ?COGNITION: ?Overall cognitive status: Impaired: Memory: Deficits impaired STM ?Awareness: Deficits impaired safety awareness ?Problem solving: Deficits poor executive reasoning  ?Behavior: Impulsive  ?  ?PATIENT EDUCATION: ?Education details: Edu on not weeding flower beds without supervision and assist as needed from son.  Edu on using garden mat under knees and bench to lower to ground and stand with son there for added help as needed.   ?Person educated: Patient  ?Education method: Explanation, Demonstration, and Verbal cues ?Education comprehension: verbalized understanding and needs further education ?  ?HOME EXERCISE PROGRAM: ?Access Code: [email or web address] ?URL: [email or web address] ?Date: 09/04/2021 ?Prepared by: Elease Etienne ? ?Exercises ?- Sit to Stand with Resistance Around Legs  - 1 x daily - 7 x weekly - 3 sets - 10 reps ?- Heel to toe walking   - 1 x daily - 7 x weekly - 3 sets - 10 reps ?- Side Stepping with Resistance at Thighs and Counter Support  - 1 x daily - 7 x weekly - 3 sets - 10 reps ?- Standing March with Counter Support  - 1 x daily - 7 x weekly - 3 sets - 10 reps - 2 second hold ?- Heel Raises with Counter Support  - 1 x daily - 4 x weekly - 2 sets - 12 reps ?- Squat with Counter Support  - 1 x daily - 4 x weekly - 2 sets - 12 reps ?  ?GOALS: ?Goals reviewed with patient? Yes ?  ?SHORT TERM GOALS: Target date: 09/03/2021 ?  ?Pt will perform initial HEP with S* for improved strength, balance, transfers and gait. ?Baseline: established 08/08/2021 ?Goal status: MET ?  ?2.  Pt will improve gait velocity to at least 2.3 ft/s with LRAD and S* for improved gait efficiency and  functional mobility ?Baseline: 1.77 ft/s with rollator w/CGA; 09/02/2021 3.42 ft/sec no AD supervision ?Goal status: MET ?  ?3.  Pt will improve Berg score to 42/56 for decreased fall risk ? ?Baseline: 35/56; 09/02/2021 46/56 ?Goal status: MET ?  ?4.  Pt will improve 30s STS to 11 repetitions or greater without BUE support to demonstrate improved functional strength and transfer efficiency.   ?Baseline: 8 reps without BUE support; 10 reps w/o UE support ?Goal status: PARTIALLY MET ?  ?  ?LONG TERM GOALS: Target date: 10/01/2021 ?  ?Pt will ascend/descend 4 steps using step-to pattern and LRAD mod I to imitate home environment  ?Baseline:  ?Goal status: INITIAL ?  ?2.  Pt will improve normal TUG to less than or equal to 15 seconds w/LRAD mod I for improved functional mobility and decreased fall risk. ?  ?Baseline: 22.17s w/rollator and CGA ?Goal status: INITIAL ?  ?3.  Pt will improve gait  velocity to at least 2.7 ft/s with LRAD mod I for improved gait efficiency and performance at community ambulator level  ?  ?Baseline: 1.77 ft/s with rollator and CGA ?Goal status: INITIAL ?  ?4.  Pt will improve Berg score to 50/56 for decreased fall risk ? ?Baseline: 35/56 ?Goal status: INITIAL ?  ?  ?ASSESSMENT: ?  ?CLINICAL IMPRESSION: ?Session focused on addressing pt goals outside of clinic to return to weeding flowerbeds.  Extensive edu during session on current functional progress, assist still needed, and safety concerns.  Challenged pt with dual task and increased visual scanning requirements during ambulation with noted gait deviations.  Continue per POC. ?  ?  ?OBJECTIVE IMPAIRMENTS Abnormal gait, cardiopulmonary status limiting activity, decreased activity tolerance, decreased balance, decreased cognition, decreased coordination, decreased endurance, decreased knowledge of condition, difficulty walking, decreased strength, decreased safety awareness, impaired perceived functional ability, and impaired vision/preception.   ?  ?ACTIVITY LIMITATIONS cleaning, driving, meal prep, laundry, medication management, yard work, and Dentist.  ?  ?PERSONAL FACTORS Age, Fitness, and 1 comorbidity: orthostatic hypotension  are a

## 2021-09-04 NOTE — Patient Instructions (Signed)
Access Code: [email or web address] ?URL: [email or web address] ?Date: 09/04/2021 ?Prepared by: Elease Etienne ? ?Exercises ?- Sit to Stand with Resistance Around Legs  - 1 x daily - 7 x weekly - 3 sets - 10 reps ?- Heel to toe walking   - 1 x daily - 7 x weekly - 3 sets - 10 reps ?- Side Stepping with Resistance at Thighs and Counter Support  - 1 x daily - 7 x weekly - 3 sets - 10 reps ?- Standing March with Counter Support  - 1 x daily - 7 x weekly - 3 sets - 10 reps - 2 second hold ?- Heel Raises with Counter Support  - 1 x daily - 4 x weekly - 2 sets - 12 reps ?- Squat with Counter Support  - 1 x daily - 4 x weekly - 2 sets - 12 reps ?

## 2021-09-04 NOTE — Therapy (Signed)
?OUTPATIENT SPEECH LANGUAGE PATHOLOGY TREATMENT NOTE ? ? ?Patient Name: Lauren Conner ?MRN: [id number] ?DOB:02/13/1941, 81 y.o., female ?Today's Date: 09/04/2021 ? ?[facility]P: [facility] ?REFERRING PROVIDER: Cathlyn Parsons, PA-C  ? ? End of Session - 09/04/21 1017   ? ? Visit Number 8   ? Number of Visits 17   ? Date for SLP Re-Evaluation 10/29/21   ? Authorization Type Medicare   ? Progress Note Due on Visit 10   ? SLP Start Time 1017   ? SLP Stop Time  1100   ? SLP Time Calculation (min) 43 min   ? Activity Tolerance Patient tolerated treatment well   ? ?  ?  ? ?  ? ? ? ? ?Past Medical History:  ?Diagnosis Date  ? Hypertension   ? Renal disorder   ? ?No past surgical history on file. ?Patient Active Problem List  ? Diagnosis Date Noted  ? 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 Bronchiectasis with acute exacerbation ([place])   ? Encephalopathy acute 07/21/2021  ? Intracerebral hemorrhage 07/21/2021  ? Acute renal failure ([place])   ? Acute urinary retention 07/20/2021  ? Bilateral hydronephrosis 07/20/2021  ? Osteoporosis without current pathological fracture 07/20/2021  ? CKD (chronic kidney disease) stage 4, GFR 15-29 ml/min (HCC) 07/19/2021  ? Paroxysmal SVT (supraventricular tachycardia) ([place]) 07/19/2021  ? Severe sepsis ([place]) 07/19/2021  ? Pneumonia of both lungs due to infectious organism 07/19/2021  ? Acute metabolic encephalopathy 93/73/4287  ? Anemia of chronic kidney failure, stage 4 (severe) ([place]) 07/19/2021  ? Metabolic acidosis 68/03/5725  ? Hyponatremia 07/19/2021  ? Essential hypertension 11/17/2018  ? Hypothyroidism (acquired) 12/06/2015  ? Adult bronchiectasis ([place]) 03/25/2011  ? ? ?ONSET DATE: 07/19/2021 ? ?REFERRING DIAG: I61.9 (ICD-10-CM) - Intraparenchymal hemorrhage of brain ([place])  ? ?THERAPY DIAG:  ?Cognitive communication deficit ? ?SUBJECTIVE: "It's been a busy week"  ? ?PAIN:  ?Are you having pain? No ? ?OBJECTIVE:  ? ?TODAY'S TREATMENT: ?09-04-21: Pt reports increased  participation at home. Is doing more household chores-- dishes, making bed, laundry, showering, sweeping, grocery shopping, etc. Pt teaches back x2 memory compensation being utilized at home- a calendar, an ongoing grocery list, medication box, with high success. Pt demonstrates association internal memory strategy in session in recall of personal grocery list. Also demonstrating successful problem solving w/ grocery shopping. Ongoing education on using modifications/compensations for managing finances. Pt implemented ST strategies of using clean sheet of paper at home with success. Led pt through Doctor, hospital activity in which she used her receipts to write onto ledger and added them to see how much she had spent, using her personal calculator. Pt accurately completes task with rare min-A. Pt able to describe her bill paying system with mod-A from Lauren Conner. Generated strategies to streamline process and implement system to ensure all bills are paid when they need to be. Currently Lauren Conner's children are supervising to ensure checks are written accurately.  ? ?09-02-21: Pt reports she has not been sorting her medications. Tells ST "I've got to get back to doing it." ST provided re-education on medication management compensations. Pt will attempt to sort pills with DIL supervision this weekend. Reassured pt of her previous success doing this task in therapy. Target financial management. Provide education on use of compensations (writing out, checking off, managing calc) which can enable successful manipulation of financial figures. Using compensations, pt able to complete simple financial balancing activity with 100% accuracy with rare mod-A for calculator usage (knowing which  button to hit) and which # she is on. Will plan to assist pt in balancing own checkbook next session, pt to bring receipts.   ? ?08-20-21: Reviewed calendar usage as tool to aid in memory and orientation. Recommend that pt consolidate information  from her personal planner which she was using prior to stroke onto large calendar to have all information in one place. Completed problem solving tasks involving medication management. Generated solution to real life scenario in which her pharmacy is out of one of her medications. Helped pt generate a script to use when calling pharmacy to see which medication is was and when it would be expected to be refilled. Provided written sequence for deciding if she needed to have prescription sent to another pharmacy.  Max-A required for successful completion of functional problem solving. Using guiding questions, pt appears to question her responses, and often telling ST "I don't know." Pt able to verbalize x4 medication management strategies with mod I (use medication box, keep where she can see, use delivery service, take pills at same time daily).  ?  ?PATIENT EDUCATION: ?Education details: see above ?Person educated: Patient  ?Education method: Explanation, Demonstration, and Handout ?Education comprehension: verbalized understanding, returned demonstration, and needs further education ?  ?  ?GOALS: ?Goals reviewed with patient? Yes ?  ?SHORT TERM GOALS: Target date: 09/03/2021 ?  ?Pt will complete CLQT and PROM within first 2 sessions. ?Baseline: initiated ?Goal status: GOAL MET  ? ?2.  Pt will accurately sort medications into weekly pill box with occasional min A over 2 sessions.  ?Baseline: medications being managed by care partner; 09/02/2021 ?Goal status: PARTIALLY MET ?  ?3.  Pt will report successful implementation at home of x2 attention/memory compensations with occasional mod A over 2 sessions.  ?Baseline: reports difficulty focusing; impaired memory observed in session; 08-18-2021, 09-02-2021 ?Goal status: GOAL MET ?  ?4.  Pt will demonstrate adeuqate organizational skills to complete mod complex therapy tasks with usual mod A over 2 sessions  ?Baseline: attention, ex fx skills inhibiting successful completion of  tasks; 08-20-21, 09-04-2021 ?Goal status: GOAL PARTIALLY MET ?  ?5.  Pt will report increased household participation in x2 desired activities (shopping, meal prep, medication/financial management) with supervision from family members   ?Baseline: requires total A for ADLs and IADLS; 7-56-43, 08-[birth year]49 ?Goal status: GOAL MET ?  ?  ?LONG TERM GOALS: Target date: 10/29/2021 ?  ?Pt will successfully manage household activities using external aids (to-do list, schedule, etc) with rare min A over 1 weeks  ?Baseline: total A for all ADLs ?Goal status: ONGOING ?  ?2.  Pt will successfully implement a bill paying system with rare min A over 1 week period to promote independence with managing finances ?Baseline: requires total A ?Goal status: ONGOING ?  ?3.  Pt will demonstrate adeuqate organizational skills to complete mod complex therapy tasks with modified independence (compensations) in 2 sessions  ?Baseline: attention, ex fx skills inhibiting successful completion of tasks ?Goal status: ONGOING ? ?4. Pt will demonstrate 5 pt improvement on Cognitive Fx - Abilities PROM by d/c.  ?Baseline: 89 ?Goal Status: ONGOING ?  ?  ?ASSESSMENT: ?  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 Pt presents with impairments in domains of executive function, attention, memory, and problem solving. Pt reports difficulty with simple every day tasks such as using remote or cell phone. Demonstrates intellectual and emergent awareness which causes frustration for pt. Demonstrates poor  frustration tolerance but is able to reengage given encouragement from ST in session. Requires total A for ADLs and IADLs at this time, prior to acute event pt was fully independent. Pt desires to return to living independently. Pt stated goals are to return to managing finances, managing medications, taking care of home to include household chores, and meal planning and grocery shopping  for self. ?  ?OBJECTIVE IMPAIRMENTS include attention, memory, and executive functioning. These impairments are limiting patient from managing medications, managing appointments, managing finances, househ

## 2021-09-05 ENCOUNTER — Encounter: Payer: Medicare Other | Admitting: Speech Pathology

## 2021-09-08 ENCOUNTER — Ambulatory Visit: Payer: Medicare Other

## 2021-09-08 ENCOUNTER — Encounter: Payer: Medicare Other | Admitting: Occupational Therapy

## 2021-09-08 ENCOUNTER — Encounter: Payer: Medicare Other | Admitting: Speech Pathology

## 2021-09-10 ENCOUNTER — Ambulatory Visit: Payer: Medicare Other | Admitting: Occupational Therapy

## 2021-09-10 ENCOUNTER — Ambulatory Visit: Payer: Medicare Other | Admitting: Speech Pathology

## 2021-09-10 ENCOUNTER — Ambulatory Visit: Payer: Medicare Other | Admitting: Physical Therapy

## 2021-09-10 ENCOUNTER — Encounter: Payer: Self-pay | Admitting: Occupational Therapy

## 2021-09-10 DIAGNOSIS — H53462 Homonymous bilateral field defects, left side: Secondary | ICD-10-CM

## 2021-09-10 DIAGNOSIS — R2681 Unsteadiness on feet: Secondary | ICD-10-CM

## 2021-09-10 DIAGNOSIS — M6281 Muscle weakness (generalized): Secondary | ICD-10-CM

## 2021-09-10 DIAGNOSIS — R2689 Other abnormalities of gait and mobility: Secondary | ICD-10-CM

## 2021-09-10 DIAGNOSIS — R41841 Cognitive communication deficit: Secondary | ICD-10-CM

## 2021-09-10 NOTE — Therapy (Signed)
?OUTPATIENT PHYSICAL THERAPY TREATMENT NOTE- DISCHARGE SUMMARY ? ? ?Patient Name: Lauren Conner ?MRN: [id number] ?DOB:[birth year], 81 y.o., female ?Today's Date: 09/10/2021 ? ?PCP: [facility] ?REFERRING PROVIDER: Cathlyn Parsons, PA-C  ? ?PHYSICAL THERAPY DISCHARGE SUMMARY ? ?Visits from Start of Care: 8 ? ?Current functional level related to goals / functional outcomes: ?See below ?  ?Remaining deficits: ?Minor gait deviations to R side which pt able to self-correct, reduced single leg stance which is addressed by HEP ?  ?Education / Equipment: ?HEP  ? ?Patient agrees to discharge. Patient goals were met. Patient is being discharged due to meeting the stated rehab goals and being satisfied with progress in PT.  ? ? ? PT End of Session - 09/10/21 1102   ? ? Visit Number 8   ? Number of Visits 17   ? Date for PT Re-Evaluation 10/29/21   ? Authorization Type Medicare A & B   ? Progress Note Due on Visit 10   ? PT Start Time 1101   ? PT Stop Time 5361   ? PT Time Calculation (min) 41 min   ? Equipment Utilized During Treatment --   ? Activity Tolerance Patient tolerated treatment well   ? Behavior During Therapy [facility] for tasks assessed/performed   ? ?  ?  ? ?  ? ? ? ? ?Past Medical History:  ?Diagnosis Date  ? Hypertension   ? Renal disorder   ? ?No past surgical history on file. ?Patient Active Problem List  ? Diagnosis Date Noted  ? 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 Bronchiectasis with acute exacerbation ([place])   ? Encephalopathy acute 07/21/2021  ? Intracerebral hemorrhage 07/21/2021  ? Acute renal failure ([place])   ? Acute urinary retention 07/20/2021  ? Bilateral hydronephrosis 07/20/2021  ? Osteoporosis without current pathological fracture 07/20/2021  ? CKD (chronic kidney disease) stage 4, GFR 15-29 ml/min (HCC) 07/19/2021  ? Paroxysmal SVT (supraventricular tachycardia) ([place]) 07/19/2021  ? Severe sepsis ([place]) 07/19/2021  ? Pneumonia of both lungs due to infectious organism  07/19/2021  ? Acute metabolic encephalopathy 44/31/5400  ? Anemia of chronic kidney failure, stage 4 (severe) ([place]) 07/19/2021  ? Metabolic acidosis 86/76/1950  ? Hyponatremia 07/19/2021  ? Essential hypertension 11/17/2018  ? Hypothyroidism (acquired) 12/06/2015  ? Adult bronchiectasis ([place]) 03/25/2011  ? ? ?REFERRING DIAG: I61.9 (ICD-10-CM) - Intraparenchymal hemorrhage of brain ([place])  ? ?THERAPY DIAG:  ?Unsteadiness on feet ? ?Other abnormalities of gait and mobility ? ?PERTINENT HISTORY: anemia of chronic disease, paroxysmal SVT hypothyroidism hypertension CKD stage IV  ? ?PRECAUTIONS: Fall and orthostatic. L homonymous hemianopsia  ? ?SUBJECTIVE: Pt states her new medicine has been great, no issues with BP. Feels as though she can start being independent again, not requiring assistance at home and is ready to "get her space back". Feels as though she can start driving again soon.  ? ?PAIN:  ?Are you having pain? No ?VITALS:  ?Not assessed today due to pt report of it being controlled and asymptomatic ? ? ?OBJECTIVE:  ?  ?DIAGNOSTIC FINDINGS: Cranial CT scan showed large area of acute intraparenchymal hemorrhage within the right temporoparietal lobe 4.0 x 4.0 x 4.1 cm with surrounding vasogenic edema with mild effacement of the occipital horn and atrium of the right lateral ventricle.  Small amount of subarachnoid hemorrhage within [place] at the anterior right temporal lobe.  3 mm right to left midline shift.  ?  ?COGNITION: ?Overall cognitive status: Impaired: Memory: Deficits impaired  short term memory ? ?TODAY'S TREATMENT: ?Ther Act ?LTG assessment  ? ? [facility] PT Assessment - 09/10/21 1114   ? ?  ? Ambulation/Gait  ? Gait velocity 8.81s over 32.8' = 3.72 ft/s without AD   ?  ? Balance  ? Balance Assessed Yes   ?  ? Standardized Balance Assessment  ? Standardized Balance Assessment Timed Up and Go Test   ?  ? Berg Balance Test  ? Sit to Stand Able to stand without using hands and stabilize independently   ?  Standing Unsupported Able to stand safely 2 minutes   ? Sitting with Back Unsupported but Feet Supported on Floor or Stool Able to sit safely and securely 2 minutes   ? Stand to Sit Sits safely with minimal use of hands   ? Transfers Able to transfer safely, minor use of hands   ? Standing Unsupported with Eyes Closed Able to stand 10 seconds safely   ? Standing Unsupported with Feet Together Able to place feet together independently and stand 1 minute safely   ? From Standing, Reach Forward with Outstretched Arm Can reach forward >12 cm safely (5")   ? From Standing Position, Pick up Object from [place] to pick up shoe safely and easily   ? From Standing Position, Turn to Look Behind Over each Shoulder Looks behind from both sides and weight shifts well   ? Turn 360 Degrees Able to turn 360 degrees safely in 4 seconds or less   ? Standing Unsupported, Alternately Place Feet on Step/Stool Able to stand independently and safely and complete 8 steps in 20 seconds   ? Standing Unsupported, One Foot in Front Able to plae foot ahead of the other independently and hold 30 seconds   ? Standing on One Leg Tries to lift leg/unable to hold 3 seconds but remains standing independently   ? Total Score 51   ? Berg comment: moderate fall risk   ?  ? Timed Up and Go Test  ? Normal TUG (seconds) 9.18   Independent, no AD  ? ?  ?  ? ?  ? ?Added single leg stance to HEP (see bolded below) to improve single leg stability  ? ?Gait Training/NMR  ? ?STAIRS: ? Level of Assistance: Complete Independence ? Stair Negotiation Technique: Step to Pattern with No Rails ? Number of Stairs: 8  ? Height of Stairs: 6"  ?Comments: Good BLE coordination and sequencing, no impulsiveness or imbalance ? ?GAIT: ?Gait pattern:  mild lateral gait deviations to R side and step through pattern ?Distance walked: various clinic distances, 300' outside in grass and on sidewalk and in gravel ?Assistive device utilized: None ?Level of assistance: Complete  Independence ?Comments: Pt practiced ambulating on grass outside x200' with no AD, noted minor lateral gait deviations (R > L) that pt able to self-correct, no LOB noted. Pt able to walk fwd/retro and side step in gravel pit to imitate home environment, as her yard is largely comprised [place] ? ?Outside w/yoga mat and bench set-up in grass, pt practiced stand <>tall kneel <>stand transfer to imitate working in her flower beds at home. Pt able to complete transfer independently, noted good BLE coordination and safe use of bench. Encouraged pt to obtain elevated bench to assist at home  ? ? ?PATIENT EDUCATION: ?Education details: LTG assessment, DC plan, obtaining bench for safe yardwork at home, addition to HEP, obtaining new PT referral if mobility level changes in future ?Person educated: Patient  ?  Education method: Explanation and handout ?Education comprehension: verbalized understanding  ?  ?HOME EXERCISE PROGRAM: ?Access Code: [email or web address] ?URL: [email or web address] ?Date: 09/10/2021 ?Prepared by: Mickie Bail  ? ?Exercises ?- Sit to Stand with Resistance Around Legs  - 1 x daily - 7 x weekly - 3 sets - 10 reps ?- Heel to toe walking   - 1 x daily - 7 x weekly - 3 sets - 10 reps ?- Side Stepping with Resistance at Thighs and Counter Support  - 1 x daily - 7 x weekly - 3 sets - 10 reps ?- Standing March with Counter Support  - 1 x daily - 7 x weekly - 3 sets - 10 reps - 2 second hold ?- Heel Raises with Counter Support  - 1 x daily - 4 x weekly - 2 sets - 12 reps ?- Squat with Counter Support  - 1 x daily - 4 x weekly - 2 sets - 12 reps ?- Standing Single Leg Stance with Counter Support  - 1 x daily - 7 x weekly - 3 reps - 30 second hold ?  ?GOALS: ?Goals reviewed with patient? Yes ?  ?SHORT TERM GOALS: Target date: 09/03/2021 ?  ?Pt will perform initial HEP with S* for improved strength, balance, transfers and gait. ?Baseline: established 08/08/2021 ?Goal status: MET ?  ?2.  Pt will improve  gait velocity to at least 2.3 ft/s with LRAD and S* for improved gait efficiency and functional mobility ?Baseline: 1.77 ft/s with rollator w/CGA; 09/02/2021 3.42 ft/sec no AD supervision ?Goal status: MET ?  ?3.  Pt wi

## 2021-09-10 NOTE — Therapy (Signed)
?OUTPATIENT OCCUPATIONAL THERAPY TREATMENT NOTE ? ? ?Patient Name: Lauren Conner ?MRN: [id number] ?DOB:02-09-1941, 81 y.o., female ?Today's Date: 08/08/2021 ? ?[facility]P: [facility] ?REFERRING PROVIDER: Dr. Naaman Plummer ? ? OT End of Session - 09/10/21 1152   ? ? Visit Number 8   ? Number of Visits 17   ? Date for OT Re-Evaluation 10/01/21   ? Authorization Type MCR, [employer]   ? Authorization - Visit Number 8   ? Authorization - Number of Visits 10   ? Progress Note Due on Visit 10   ? OT Start Time 1150   ? OT Stop Time 1230   ? OT Time Calculation (min) 40 min   ? Activity Tolerance Patient tolerated treatment well   ? Behavior During Therapy [facility] for tasks assessed/performed   ? ?  ?  ? ?  ? ? ? ? ?  ? ? ? ?Patient Active Problem List  ? Diagnosis Date Noted  ? 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 Bronchiectasis with acute exacerbation ([place])   ? Encephalopathy acute 07/21/2021  ? Intracerebral hemorrhage 07/21/2021  ? Acute renal failure ([place])   ? Acute urinary retention 07/20/2021  ? Bilateral hydronephrosis 07/20/2021  ? Osteoporosis without current pathological fracture 07/20/2021  ? CKD (chronic kidney disease) stage 4, GFR 15-29 ml/min (HCC) 07/19/2021  ? Paroxysmal SVT (supraventricular tachycardia) ([place]) 07/19/2021  ? Severe sepsis ([place]) 07/19/2021  ? Pneumonia of both lungs due to infectious organism 07/19/2021  ? Acute metabolic encephalopathy 12/75/1700  ? Anemia of chronic kidney failure, stage 4 (severe) ([place]) 07/19/2021  ? Metabolic acidosis 17/49/4496  ? Hyponatremia 07/19/2021  ? Essential hypertension 11/17/2018  ? Hypothyroidism (acquired) 12/06/2015  ? Adult bronchiectasis ([place]) 03/25/2011  ? ? ?ONSET DATE: 07/19/21 ? ?REFERRING DIAG: I61.9 (ICD-10-CM) - Intraparenchymal hemorrhage of brain ([place])  ? ?THERAPY DIAG:  ?Left homonymous hemianopsia -  ?Unsteadiness on feet - ?Muscle weakness (generalized) -  ?Other symptoms and signs involving cognitive functions following cerebral  infarction  ? ?SUBJECTIVE:  ? ?SUBJECTIVE STATEMENT:I've cooked eggs,sausage,bacon w/ supervision ? ? ?PERTINENT HISTORY: Lauren Conner is a 81 y.o. right-handed female with history of anemia of chronic disease, paroxysmal SVT hypothyroidism hypertension CKD stage IV followed by Dr.Lehrich with nephrology at [facility] with creatinine baseline 2.0.  ? ?PRECAUTIONS: Fall and Other: no driving, abdominal binder d/t orthostatic hypotension, foley catheter, and Lt visual field cut ? ?WEIGHT BEARING RESTRICTIONS No ? ? ? ?PAIN:  ?Are you having pain? No ? ? ? ? ?OBJECTIVE:  ?TODAY'S TREATMENT:  ? ?Assessed STG's and some LTG's - see goal section. Reviewed previously issued education. Pt writing name/address 3 times w/ 98% legibility - only 1 number in address more difficult to read.  ? ?Pt cooked fried egg and toast in clinic kitchen w/ mild difficulty using unfamiliar stove and toaster, min questioning cues to plan ahead (once egg started, go ahead and start toast), and mild LOB x 2 w/ turns. Overall pt did well and I'ly remembered to turn stove off. Recommended continued supervision (distant sup ok) for cooking and not to cook more than 2 items at once.  ? ?Pt reports washing dishes, doing laundry, and vacuuming at home. Pt carried laundry basket around in clinic w/ no difficulty.  ? ?BP at end of session = 132/84, HR = 86 ? ? ? ? ?  ? ? ? ? ? ? ? ? ?08/20/21: PATIENT EDUCATION: ?Education details: HEP for BUE's for UB endurance ?  Person educated: Patient ?Education method: Explanation, Demonstration, Verbal cues, and Handouts ?Education comprehension: verbalized understanding, returned demonstration, and verbal cues required ? ? ?08/19/21: PATIENT EDUCATION:  ?Education details: memory strategies ?Person educated: Patient ?Education method: Explanation, Verbal cues, and Handouts ?Education comprehension: verbalized understanding and needs further education ? ? ? ? ?GOALS: ? ? ?SHORT TERM GOALS: Target date:  09/03/2021 ? ?Pt will be independent with HEP targeting overall endurance ? ?Baseline: n/a ?Goal status: MET (issued 08/20/21) ? ?2.  Pt to verbalize understanding with visual scanning strategies ?Baseline: dep ?Goal status: MET (handout issued 08/08/21) ? ?3.  Pt will perform basic snack prep w/ supervision  ?Baseline: dep ?Goal status: MET pt reports getting fruit and making cereal ? ?4.  Pt to perform tabletop scanning attending to Lt side 75% or greater ?Baseline: dep ?Goal status: MET ? ?5.  Pt to verbalize understanding with memory compensatory strategies ?Baseline: dep ?Goal status: MET, pt verbalizes understanding ? ? ? ?LONG TERM GOALS: Target date: 10/01/2021 ? ?Pt will perform simple stovetop cooking task and light housekeeping at mod I and good safety awareness ?Baseline: dep ?Goal status: Partially met - recommending distant supervision for cooking, Mod I for light housekeeping ? ?2.  Pt will perform environmental scanning with 85% accuracy in a min distracting environment.   ?Baseline: dep ?Goal status: ONGOING ? ?3.  Pt to perform BADLS I'ly  ?Baseline: min assist for shower transfers and dressing ?Goal status: MET ? ?4.  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 ?5.  Pt to perform dynamic standing tasks safely w/o LOB ?Baseline: unsteadiness ?Goal status: ONGOING - pt w/ mild LOB during turns during cooking task ? ? ?ASSESSMENT: ? ?CLINICAL IMPRESSION: ?Pt has met all STG's and some LTG's. Progressing towards remaining goals. Pt has made significant improvement since initial evaluation.  ? ?PERFORMANCE DEFICITS in functional skills including ADLs, IADLs, sensation, strength, FMC, mobility, balance, endurance, decreased knowledge of precautions, decreased knowledge of use of DME, vision, and UE functional use, cognitive skills including attention, memory, perception, problem solving, safety  awareness, and sequencing,  ? ?IMPAIRMENTS are limiting patient from ADLs, IADLs, and social participation.  ? ?COMORBIDITIES may have co-morbidities  that affects occupational performance. Patient will benefit from skilled OT to address above impairments and improve overall function. ? ?MODIFICATION OR ASSISTANCE TO COMPLETE EVALUATION: Min-Moderate modification of tasks or assist with assess necessary to complete an evaluation. ? ?OT OCCUPATIONAL PROFILE AND HISTORY: Detailed assessment: Review of records and additional review of physical, cognitive, psychosocial history related to current functional performance. ? ?CLINICAL DECISION MAKING: Moderate - several treatment options, min-mod task modification necessary ? ?REHAB POTENTIAL: Good ? ?EVALUATION COMPLEXITY: Moderate ? ? ? ?PLAN: ?OT FREQUENCY: 2x/week ? ?OT DURATION: 8 weeks ? ?PLANNED INTERVENTIONS: self care/ADL training, therapeutic exercise, therapeutic activity, neuromuscular re-education, manual therapy, functional mobility training, patient/family education, cognitive remediation/compensation, visual/perceptual remediation/compensation, coping strategies training, and DME and/or AE instructions ? ?RECOMMENDED OTHER SERVICES: n/a ? ?CONSULTED AND AGREED WITH PLAN OF CARE: Patient  ? ?PLAN FOR NEXT SESSION:  environmental scanning, dynamic standing balance and IADLS. Anticipate d/c next week.  ? ? ?Redmond Baseman, OTR/L ?09/10/21 12:38 PM ?Phone [phone] ?FAX [phone] ? ? ? ? ?

## 2021-09-10 NOTE — Therapy (Signed)
?OUTPATIENT SPEECH LANGUAGE PATHOLOGY TREATMENT NOTE ? ? ?Patient Name: Lauren Conner ?MRN: [id number] ?DOB:03-27-1941, 81 y.o., female ?Today's Date: 09/10/2021 ? ?PCP: [facility] ?REFERRING PROVIDER: Cathlyn Parsons, PA-C  ? ? ? ? ? ? ?Past Medical History:  ?Diagnosis Date  ? Hypertension   ? Renal disorder   ? ?No past surgical history on file. ?Patient Active Problem List  ? Diagnosis Date Noted  ? 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 Bronchiectasis with acute exacerbation ([place])   ? Encephalopathy acute 07/21/2021  ? Intracerebral hemorrhage 07/21/2021  ? Acute renal failure ([place])   ? Acute urinary retention 07/20/2021  ? Bilateral hydronephrosis 07/20/2021  ? Osteoporosis without current pathological fracture 07/20/2021  ? CKD (chronic kidney disease) stage 4, GFR 15-29 ml/min (HCC) 07/19/2021  ? Paroxysmal SVT (supraventricular tachycardia) ([place]) 07/19/2021  ? Severe sepsis ([place]) 07/19/2021  ? Pneumonia of both lungs due to infectious organism 07/19/2021  ? Acute metabolic encephalopathy 54/62/7035  ? Anemia of chronic kidney failure, stage 4 (severe) ([place]) 07/19/2021  ? Metabolic acidosis 00/93/8182  ? Hyponatremia 07/19/2021  ? Essential hypertension 11/17/2018  ? Hypothyroidism (acquired) 12/06/2015  ? Adult bronchiectasis ([place]) 03/25/2011  ? ? ?ONSET DATE: 07/19/2021 ? ?REFERRING DIAG: I61.9 (ICD-10-CM) - Intraparenchymal hemorrhage of brain ([place])  ? ?THERAPY DIAG:  ?No diagnosis found. ? ?SUBJECTIVE: "I'm glad its warmer out"  ? ?PAIN:  ?Are you having pain? No ? ?OBJECTIVE:  ? ?TODAY'S TREATMENT: ?09-10-21: Pt reports that using tools generated last session she was able to balance her checkbook. Reports other increased participation of paying bills, cooking, doing small household chores. Led pt through cognitive exercise in which she determined amount of varying $ increments she would need to use to meet specified 3 digit amount. Use of printed money to modify task for  success. Usual max-A, faded to usual min-A, for accuracy of 10 problems. ID errors x2 during task. Update HEP with remaining 10 problems.  ? ?09-04-21: Pt reports increased participation at home. Is doing more household chores-- dishes, making bed, laundry, showering, sweeping, grocery shopping, etc. Pt teaches back x2 memory compensation being utilized at home- a calendar, an ongoing grocery list, medication box, with high success. Pt demonstrates association internal memory strategy in session in recall of personal grocery list. Also demonstrating successful problem solving w/ grocery shopping. Ongoing education on using modifications/compensations for managing finances. Pt implemented ST strategies of using clean sheet of paper at home with success. Led pt through Doctor, hospital activity in which she used her receipts to write onto ledger and added them to see how much she had spent, using her personal calculator. Pt accurately completes task with rare min-A. Pt able to describe her bill paying system with mod-A from [place]. Generated strategies to streamline process and implement system to ensure all bills are paid when they need to be. Currently Lauren Conner's children are supervising to ensure checks are written accurately.  ? ? ?PATIENT EDUCATION: ?Education details: see above ?Person educated: Patient  ?Education method: Explanation, Demonstration, and Handout ?Education comprehension: verbalized understanding, returned demonstration, and needs further education ?  ?  ?GOALS: ?Goals reviewed with patient? Yes ?  ?SHORT TERM GOALS: Target date: 09/03/2021 ?  ?Pt will complete CLQT and PROM within first 2 sessions. ?Baseline: initiated ?Goal status: GOAL MET  ? ?2.  Pt will accurately sort medications into weekly pill box with occasional min A over 2 sessions.  ?Baseline: medications being managed by care partner; 09/02/2021 ?Goal  status: PARTIALLY MET ?  ?3.  Pt will report successful implementation at home of x2  attention/memory compensations with occasional mod A over 2 sessions.  ?Baseline: reports difficulty focusing; impaired memory observed in session; 08-18-2021, 09-02-2021 ?Goal status: GOAL MET ?  ?4.  Pt will demonstrate adeuqate organizational skills to complete mod complex therapy tasks with usual mod A over 2 sessions  ?Baseline: attention, ex fx skills inhibiting successful completion of tasks; 08-20-21, 09-04-2021 ?Goal status: GOAL PARTIALLY MET ?  ?5.  Pt will report increased household participation in x2 desired activities (shopping, meal prep, medication/financial management) with supervision from family members   ?Baseline: requires total A for ADLs and IADLS; 1-85-63, 1-49-70 ?Goal status: GOAL MET ?  ?  ?LONG TERM GOALS: Target date: 10/29/2021 ?  ?Pt will successfully manage household activities using external aids (to-do list, schedule, etc) with rare min A over 1 weeks  ?Baseline: total A for all ADLs ?Goal status: ONGOING ?  ?2.  Pt will successfully implement a bill paying system with rare min A over 1 week period to promote independence with managing finances ?Baseline: requires total A ?Goal status: ONGOING ?  ?3.  Pt will demonstrate adeuqate organizational skills to complete mod complex therapy tasks with modified independence (compensations) in 2 sessions  ?Baseline: attention, ex fx skills inhibiting successful completion of tasks ?Goal status: ONGOING ? ?4. Pt will demonstrate 5 pt improvement on Cognitive Fx - Abilities PROM by d/c.  ?Baseline: 89 ?Goal Status: ONGOING ?  ?  ?ASSESSMENT: ?  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 Pt presents with impairments in domains of executive function, attention, memory, and problem solving. Pt reports difficulty with simple every day tasks such as using remote or cell phone. Demonstrates intellectual and emergent awareness which causes frustration for pt. Demonstrates  poor frustration tolerance but is able to reengage given encouragement from ST in session. Requires total A for ADLs and IADLs at this time, prior to acute event pt was fully independent. Pt desires to return to living independently. Pt stated goals are to return to managing finances, managing medications, taking care of home to include household chores, and meal planning and grocery shopping for self. ?  ?OBJECTIVE IMPAIRMENTS include attention, memory, and executive functioning. These impairments are limiting patient from managing medications, managing appointments, managing finances, household responsibilities, and ADLs/IADLs. ?Patient will benefit from skilled SLP services to address above impairments and improve overall function. ?  ?REHAB POTENTIAL: Good ?  ?PLAN: ?SLP FREQUENCY: 2x/week ?  ?SLP DURATION: 12 weeks ?  ?PLANNED INTERVENTIONS: Environmental controls, Cueing hierachy, Cognitive reorganization, Internal/external aids, Functional tasks, SLP instruction and feedback, and Compensatory strategies ?  ? L.  CCC-SLP ?09/10/2021, 7:55 AM ?  ?

## 2021-09-11 ENCOUNTER — Ambulatory Visit: Payer: Medicare Other

## 2021-09-11 ENCOUNTER — Encounter: Payer: Medicare Other | Admitting: Speech Pathology

## 2021-09-11 ENCOUNTER — Encounter: Payer: Medicare Other | Admitting: Occupational Therapy

## 2021-09-11 ENCOUNTER — Ambulatory Visit: Payer: Medicare Other | Admitting: Occupational Therapy

## 2021-09-11 ENCOUNTER — Ambulatory Visit: Payer: Medicare Other | Admitting: Physical Therapy

## 2021-09-11 ENCOUNTER — Ambulatory Visit: Payer: Medicare Other | Admitting: Speech Pathology

## 2021-09-14 ENCOUNTER — Emergency Department
Admission: EM | Admit: 2021-09-14 | Discharge: 2021-09-14 | Disposition: A | Discharge status: Home / Self Care | Payer: Medicare Other | Attending: Emergency Medicine | Admitting: Emergency Medicine

## 2021-09-14 ENCOUNTER — Other Ambulatory Visit: Payer: Self-pay

## 2021-09-14 ENCOUNTER — Emergency Department: Payer: Medicare Other

## 2021-09-14 DIAGNOSIS — M109 Gout, unspecified: Secondary | ICD-10-CM | POA: Insufficient documentation

## 2021-09-14 DIAGNOSIS — E871 Hypo-osmolality and hyponatremia: Secondary | ICD-10-CM | POA: Insufficient documentation

## 2021-09-14 DIAGNOSIS — R55 Syncope and collapse: Secondary | ICD-10-CM | POA: Insufficient documentation

## 2021-09-14 DIAGNOSIS — I10 Essential (primary) hypertension: Secondary | ICD-10-CM | POA: Insufficient documentation

## 2021-09-14 DIAGNOSIS — R42 Dizziness and giddiness: Secondary | ICD-10-CM | POA: Diagnosis not present

## 2021-09-14 LAB — COMPREHENSIVE METABOLIC PANEL
ALT: 8 U/L (ref 0–44)
AST: 15 U/L (ref 15–41)
Albumin: 2.6 g/dL — ABNORMAL LOW (ref 3.5–5.0)
Alkaline Phosphatase: 70 U/L (ref 38–126)
Anion gap: 12 (ref 5–15)
BUN: 40 mg/dL — ABNORMAL HIGH (ref 8–23)
CO2: 20 mmol/L — ABNORMAL LOW (ref 22–32)
Calcium: 8.3 mg/dL — ABNORMAL LOW (ref 8.9–10.3)
Chloride: 99 mmol/L (ref 98–111)
Creatinine, Ser: 2.55 mg/dL — ABNORMAL HIGH (ref 0.44–1.00)
GFR, Estimated: 19 mL/min — ABNORMAL LOW (ref >60)
Glucose, Bld: 111 mg/dL — ABNORMAL HIGH (ref 70–99)
Potassium: 4.2 mmol/L (ref 3.5–5.1)
Sodium: 131 mmol/L — ABNORMAL LOW (ref 135–145)
Total Bilirubin: 0.9 mg/dL (ref 0.3–1.2)
Total Protein: 6.5 g/dL (ref 6.5–8.1)

## 2021-09-14 LAB — CBC
HCT: 33.2 % — ABNORMAL LOW (ref 36.0–46.0)
Hemoglobin: 10.3 g/dL — ABNORMAL LOW (ref 12.0–15.0)
MCH: 29.8 pg (ref 26.0–34.0)
MCHC: 31 g/dL (ref 30.0–36.0)
MCV: 96 fL (ref 80.0–100.0)
Platelets: 241 10*3/uL (ref 150–400)
RBC: 3.46 MIL/uL — ABNORMAL LOW (ref 3.87–5.11)
RDW: 14.5 % (ref 11.5–15.5)
WBC: 7.6 10*3/uL (ref 4.0–10.5)
nRBC: 0 % (ref 0.0–0.2)

## 2021-09-14 LAB — URIC ACID: Uric Acid, Serum: 8.6 mg/dL — ABNORMAL HIGH (ref 2.5–7.1)

## 2021-09-14 LAB — TROPONIN I (HIGH SENSITIVITY)
Troponin I (High Sensitivity): 12 ng/L (ref <18)
Troponin I (High Sensitivity): 11 ng/L (ref <18)

## 2021-09-14 IMAGING — US US EXTREM LOW VENOUS*R*
1 series · 14 of 24 positions shown · non-contrast
Comparison: None

CLINICAL DATA: RIGHT lower extremity pain and ankle pain for 4
days.

EXAM:
RIGHT LOWER EXTREMITY VENOUS DOPPLER ULTRASOUND
TECHNIQUE: Gray-scale sonography with compression, as well as color and duplex
ultrasound, were performed to evaluate the deep venous system(s)
from the level of the common femoral vein through the popliteal and
proximal calf veins.

[Series 1: us venous img lower uni right (dvt) · portal-venous · 14 of 44 slices shown]
[im 1/44]
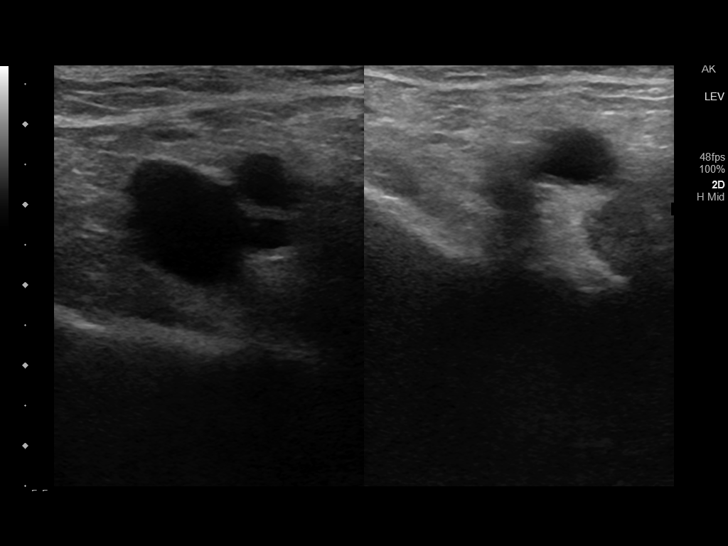
[im 4/44]
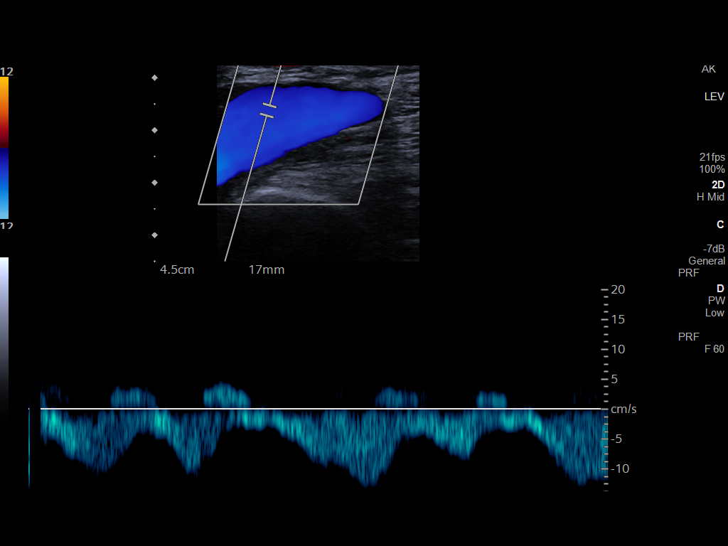
[im 8/44]
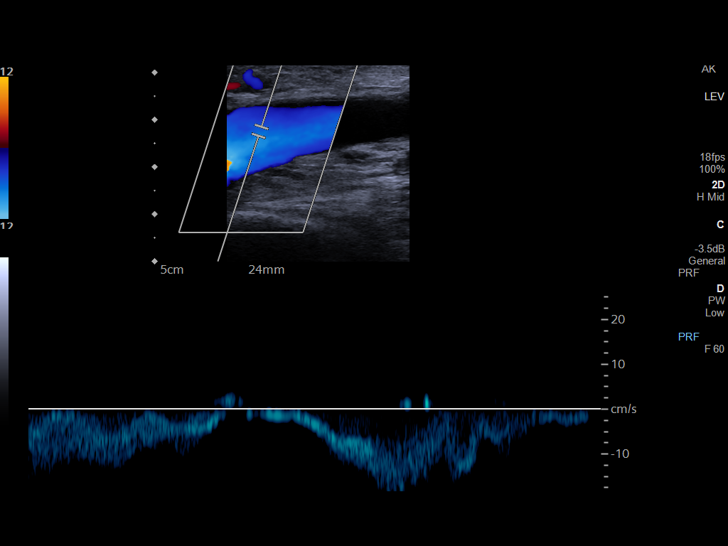
[im 12/44]
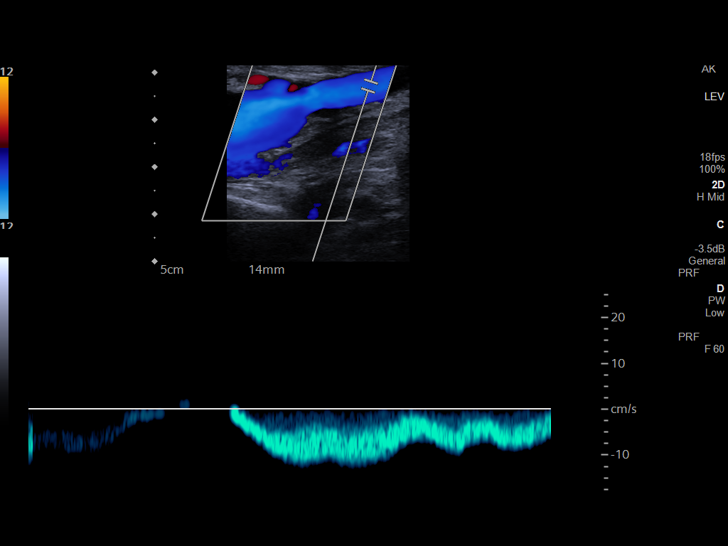
[im 14/44]
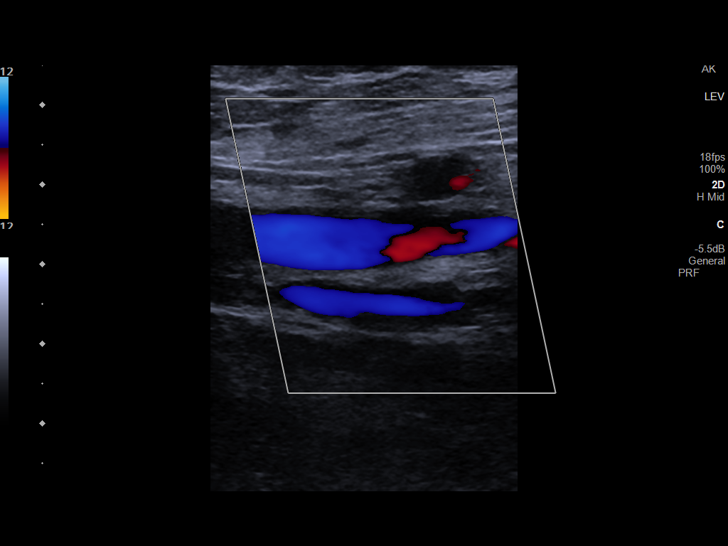
[im 17/44]
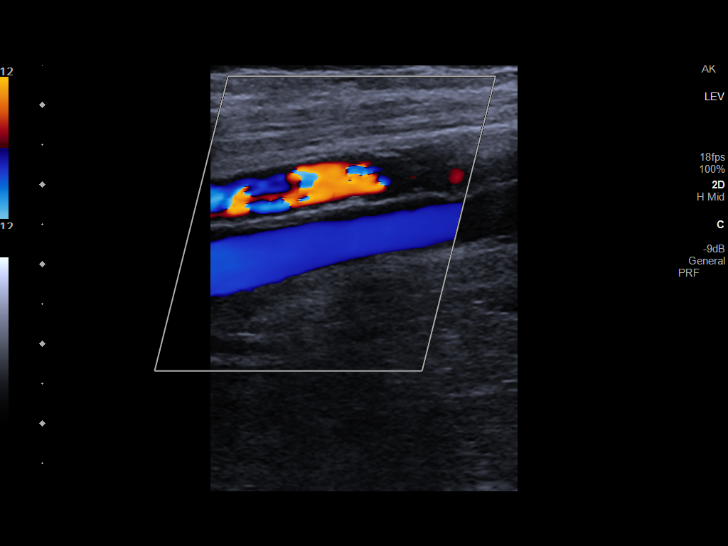
[im 21/44]
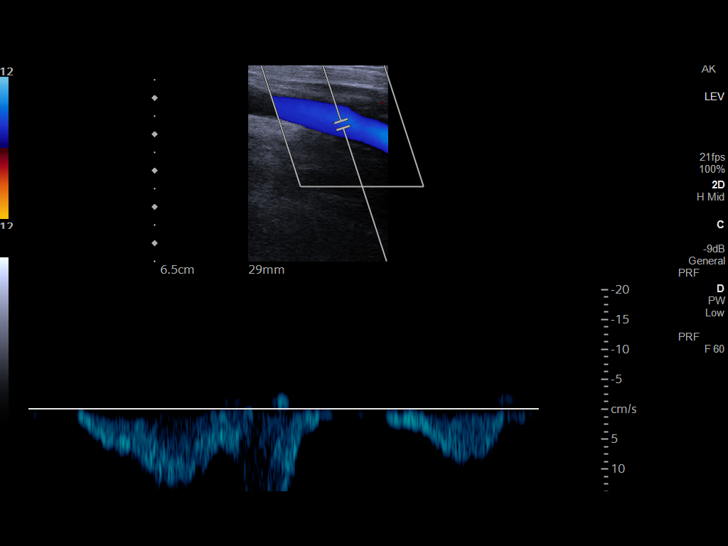
[im 23/44]
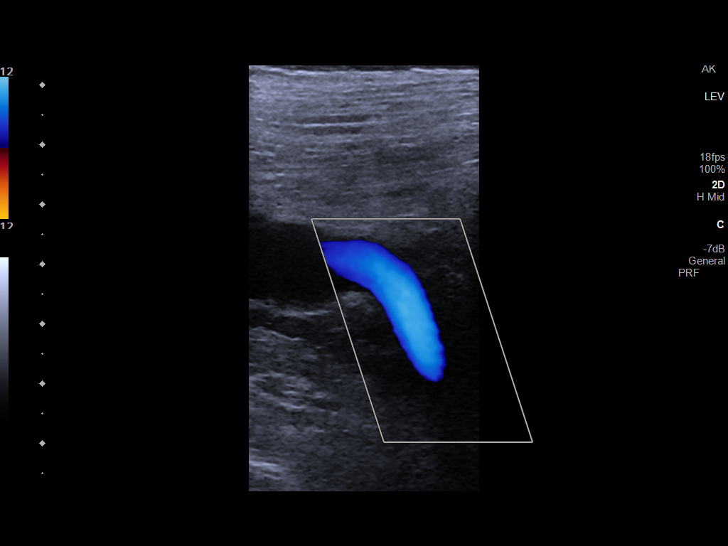
[im 27/44]
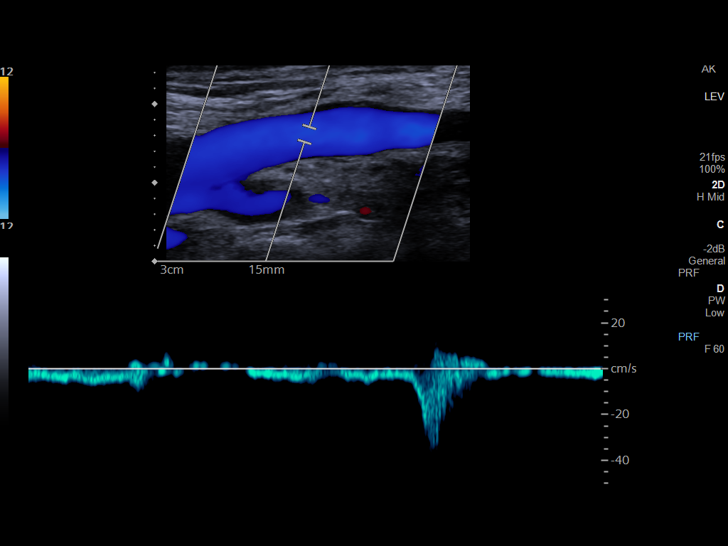
[im 30/44]
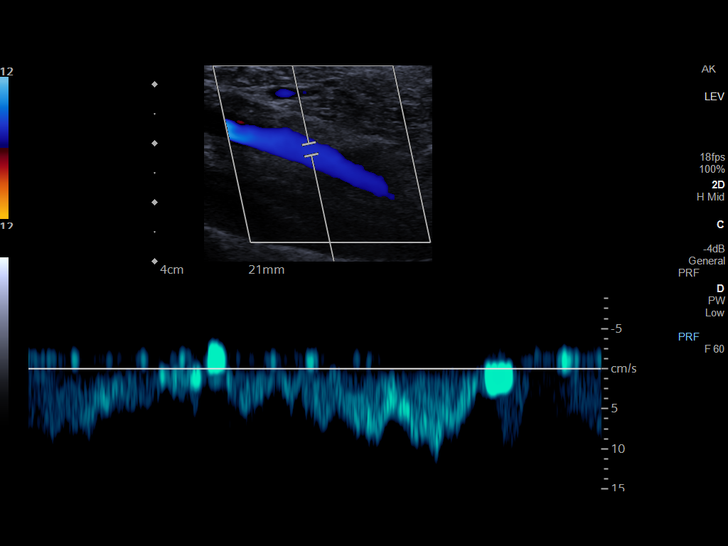
[im 34/44]
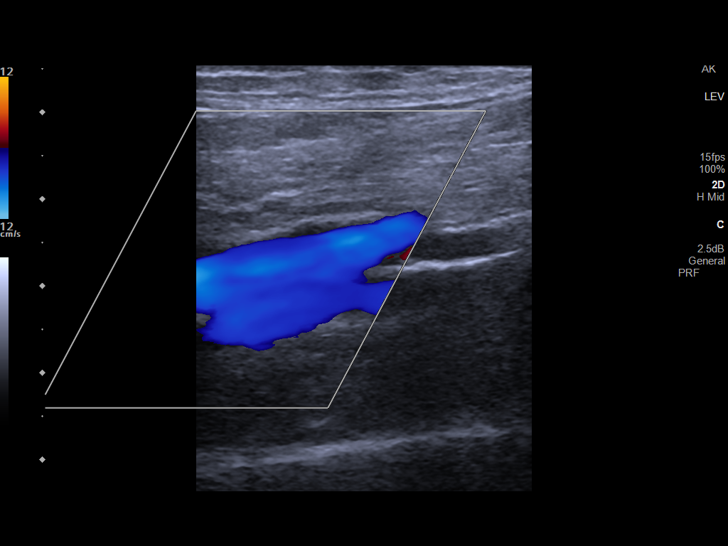
[im 36/44]
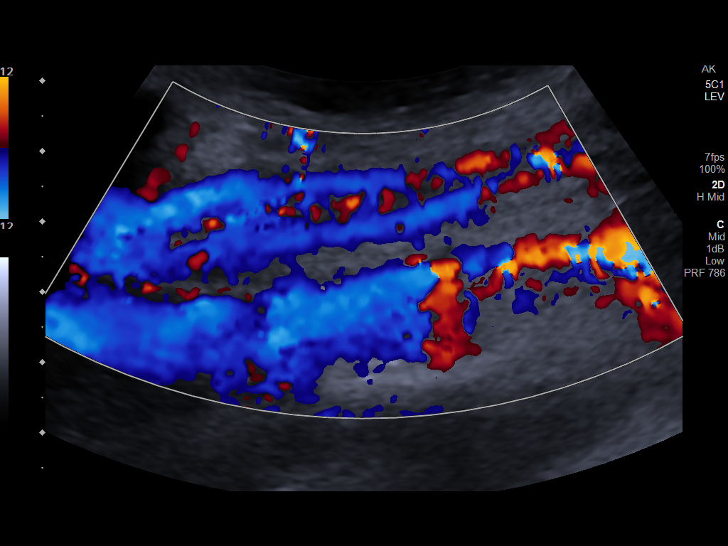
[im 40/44]
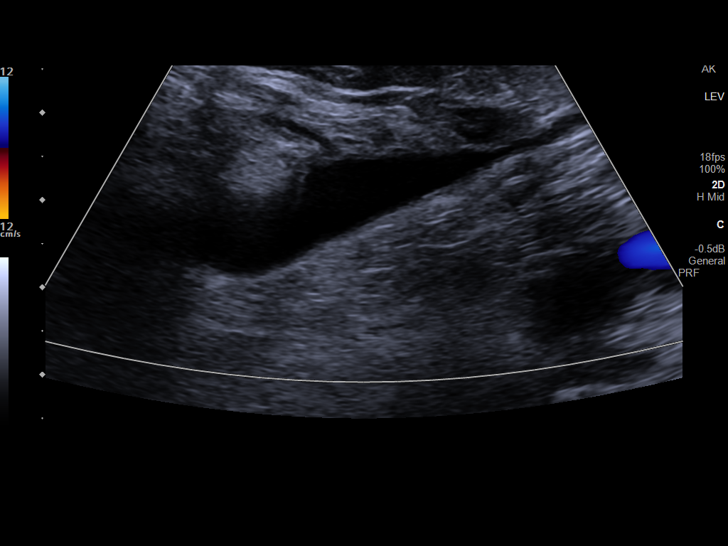
[im 44/44]
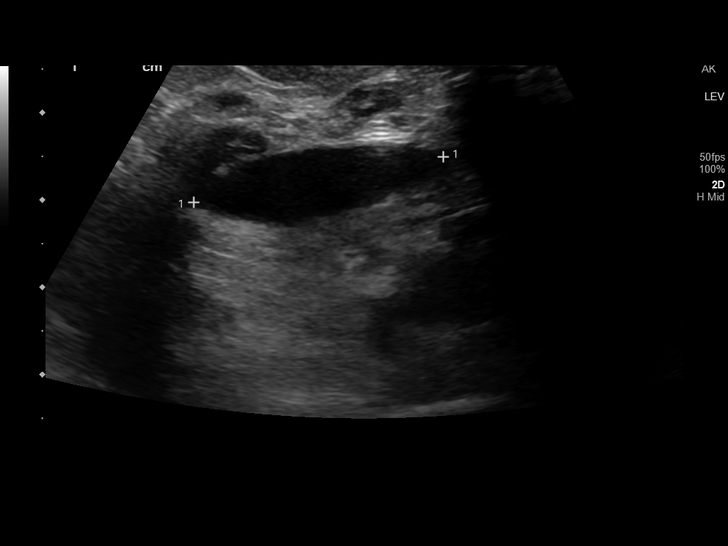

[14 of 24 positions shown; findings below may reference images not displayed]

FINDINGS: VENOUS

Normal compressibility of the common femoral, superficial femoral,
and popliteal veins, as well as the visualized calf veins.
Visualized portions of profunda femoral vein and great saphenous
vein unremarkable. No filling defects to suggest DVT on grayscale or
color Doppler imaging. Doppler waveforms show normal direction of
venous flow, normal respiratory plasticity and response to
augmentation.

Limited views of the contralateral common femoral vein are
unremarkable.

OTHER

Fluid collection in the RIGHT popliteal fossa 4.7 x 0.9 x 2.9 cm.
Edema in the RIGHT lower extremity.

Limitations: none
IMPRESSION: No sonographic evidence of DVT.

Baker's cyst. Correlate with any symptoms directly referable to the
popliteal fossa.

## 2021-09-14 MED ORDER — OXYCODONE-ACETAMINOPHEN 5-325 MG PO TABS
1.0000 | ORAL_TABLET | Freq: Once | ORAL | Status: AC
  Administered 2021-09-14: 1 via ORAL
  Filled 2021-09-14: qty 1

## 2021-09-14 MED ORDER — ONDANSETRON HCL 4 MG/2ML IJ SOLN
4.0000 mg | Freq: Once | INTRAMUSCULAR | Status: AC
Start: 2021-09-14 — End: 2021-09-14
  Administered 2021-09-14: 4 mg via INTRAVENOUS
  Filled 2021-09-14: qty 2

## 2021-09-14 MED ORDER — PREDNISONE 20 MG PO TABS: 40.0000 mg | ORAL_TABLET | Freq: Every day | ORAL | 0 refills | Status: AC

## 2021-09-14 MED ORDER — TRAMADOL HCL 50 MG PO TABS: 50.0000 mg | ORAL_TABLET | Freq: Four times a day (QID) | ORAL | 0 refills | Status: DC | PRN

## 2021-09-14 MED ORDER — SODIUM CHLORIDE 0.9 % IV BOLUS
500.0000 mL | Freq: Once | INTRAVENOUS | Status: AC
  Administered 2021-09-14: 500 mL via INTRAVENOUS

## 2021-09-14 NOTE — ED Notes (Signed)
Pt calling on call bell expressing irritation that "when will the doctor come back in?" And "why did they call in prednisone for my UTI?". Explained that workup is not complete and that primary RN and EDP will certainly come back and talk to them once there is more information to share. ?

## 2021-09-14 NOTE — ED Provider Notes (Signed)
trop are negative x2.  Sodium slightly low but patient getting some fluids.  Creatinine at baseline.  CBC no white count. ? ?Reevaluated patient and she remains asymptomatic and feels comfortable with discharge home with ambulation trial and medications already prescribed by off going doctor ?  ?Vanessa Azle, MD ?09/14/21 1720 ? ?

## 2021-09-14 NOTE — ED Triage Notes (Addendum)
Pt arrives with [facility] this morning near syncope, after ambulating to bathroom. Pt stating they felt weak, pale, called FD - FD found bp 70/30. Pt laid down without improvement. Pt was normotensive when EMS arrived. Alert and oriented for EMS. 160/83 en route. 20 g placed in L hand. 12 lead unremarkable. Denies n/v at this time but was nauseated during syncope event. Cbg 124. VSS at all times with EMS. Pt has PMH of gout and is endorsing foot pain ?

## 2021-09-14 NOTE — ED Notes (Signed)
Dc ppw provided pt denies any questions at this time. Followup and rx information provided. Pt provides verbal consent for dc at this time. Pt to lobby in wheelchair with family alert and oriented x4 ?

## 2021-09-14 NOTE — ED Notes (Signed)
Pt given gingerale and is sitting up in bed. Pt and family asking what the next steps are. This rn informed patient that we are waiting for repeat troponin results and ambulatory trial and patient will most likely be dc'd home pending results.  ?

## 2021-09-14 NOTE — ED Provider Notes (Signed)
? ?[facility] ?Provider Note ? ? ? Event Date/Time  ? First MD Initiated Contact with Patient 09/14/21 1400   ?  (approximate) ? ?History  ? ?Chief Complaint: Near Syncope ? ?HPI ? ?Lauren Conner is a 81 y.o. female with a past medical history of hypertension, gout, presents to the emergency department after near syncopal episode.  According to the patient she was at home today when she had a near syncopal episode became very dizzy and lightheaded and felt like she was going to pass out but did not do so.  EMS states fire department reported initial very low blood pressure upon their arrival, patient down and symptoms improved.  Upon arrival to the emergency department patient is hypertensive currently 149/86.  Patient's only complaint is of pain in the right ankle.  States since Wednesday she has been experiencing a gout flare.  She states during the time of her near syncopal event she was experiencing significant amount of pain in the right ankle.  No chest pain.  No shortness of breath. ? ?Physical Exam  ? ?Triage Vital Signs: ?ED Triage Vitals  ?Enc Vitals Group  ?   BP 09/14/21 1400 (!) 149/86  ?   Pulse Rate 09/14/21 1400 79  ?   Resp 09/14/21 1400 (!) 26  ?   Temp 09/14/21 1400 98.1 ?F (36.7 ?C)  ?   Temp Source 09/14/21 1400 Oral  ?   SpO2 09/14/21 1400 95 %  ?   Weight 09/14/21 1401 118 lb (53.5 kg)  ?   Height --   ?   Head Circumference --   ?   Peak Flow --   ?   Pain Score 09/14/21 1401 3  ?   Pain Loc --   ?   Pain Edu? --   ?   Excl. in [place]? --   ? ? ?Most recent vital signs: ?Vitals:  ? 09/14/21 1400  ?BP: (!) 149/86  ?Pulse: 79  ?Resp: (!) 26  ?Temp: 98.1 ?F (36.7 ?C)  ?SpO2: 95%  ? ? ?General: Awake, no distress.  ?CV:  Good peripheral perfusion.  Regular rate and rhythm  ?Resp:  Normal effort.  Equal breath sounds bilaterally.  ?Abd:  No distention.  Soft, nontender.  No rebound or guarding. ? ? ?ED Results / Procedures / Treatments  ? ?EKG ? ?EKG viewed and interpreted by  myself shows a normal sinus rhythm at 80 bpm with a narrow QRS, normal axis, normal intervals, nonspecific ST changes. ? ?RADIOLOGY ? ?Ultrasound pending ? ? ?MEDICATIONS ORDERED IN ED: ?Medications  ?oxyCODONE-acetaminophen (PERCOCET/ROXICET) 5-325 MG per tablet 1 tablet (has no administration in time range)  ?ondansetron (ZOFRAN) injection 4 mg (has no administration in time range)  ?sodium chloride 0.9 % bolus 500 mL (has no administration in time range)  ? ? ? ?IMPRESSION / MDM / ASSESSMENT AND PLAN / ED COURSE  ?I reviewed the triage vital signs and the nursing notes. ? ?Patient presents emergency department for a near syncopal episode/dizziness.  Patient states she was experiencing significant pain in her right ankle due to a gout flare when this event occurred.  Differential would include vasovagal event, no chest pain or shortness of breath less likely PE or ACS, arrhythmia.  We will check labs including uric acid level we will obtain an EKG, IV hydrate with 500 cc normal saline treat with 1 Percocet and continue to closely monitor.  Patient agreeable to plan of care. ? ?Lab  work is so far reassuring.  Troponin negative, uric acid level is elevated consistent with likely gout.  Chemistry shows chronic renal insufficiency with a creatinine of 2.5.  CBC is normal with a normal white blood cell count.  Ultrasound is pending.  We will repeat a troponin as well in 2 hours.  We will continue to closely monitor.  We will discharge with a short course of prednisone to help treat the patient's gout if the remaining work-up is negative.  Patient care signed out to oncoming provider. ? ?FINAL CLINICAL IMPRESSION(S) / ED DIAGNOSES  ? ?Right ankle pain ?Near syncope ? ?Rx / DC Orders  ? ?Gout ?Near syncope ? ?Note:  This document was prepared using Dragon voice recognition software and may include unintentional dictation errors. ?  Harvest Dark, MD ?09/14/21 1519 ? ?

## 2021-09-14 NOTE — ED Notes (Signed)
Pt ambulatory with walker endorsing "Im walking better here than before. I feel good." ?

## 2021-09-14 NOTE — Discharge Instructions (Addendum)
Please take your medications as prescribed.  Please follow-up with your doctor within the next 2 to 3 days for recheck/reevaluation.  Return to the emergency department for any further episodes of lightheadedness, dizziness or for any chest pain or any other symptom personally concerning to yourself.  Please increase your fluid intake over the next several days as well. ?

## 2021-09-15 ENCOUNTER — Encounter: Payer: Medicare Other | Admitting: Speech Pathology

## 2021-09-15 ENCOUNTER — Encounter: Payer: Medicare Other | Admitting: Occupational Therapy

## 2021-09-15 ENCOUNTER — Ambulatory Visit: Payer: Medicare Other

## 2021-09-17 ENCOUNTER — Ambulatory Visit: Payer: Medicare Other | Admitting: Physical Therapy

## 2021-09-17 ENCOUNTER — Ambulatory Visit: Payer: Medicare Other | Admitting: Occupational Therapy

## 2021-09-17 ENCOUNTER — Ambulatory Visit: Payer: Medicare Other

## 2021-09-17 ENCOUNTER — Encounter: Payer: Medicare Other | Admitting: Speech Pathology

## 2021-09-17 ENCOUNTER — Ambulatory Visit: Payer: Medicare Other | Admitting: Speech Pathology

## 2021-09-19 ENCOUNTER — Ambulatory Visit: Payer: Medicare Other | Admitting: Occupational Therapy

## 2021-09-19 ENCOUNTER — Ambulatory Visit: Payer: Medicare Other | Admitting: Physical Therapy

## 2021-09-19 ENCOUNTER — Ambulatory Visit: Payer: Medicare Other | Admitting: Speech Pathology

## 2021-09-22 ENCOUNTER — Encounter: Payer: Medicare Other | Admitting: Speech Pathology

## 2021-09-22 ENCOUNTER — Ambulatory Visit: Payer: Medicare Other

## 2021-09-23 ENCOUNTER — Ambulatory Visit: Payer: Medicare Other | Attending: Physician Assistant | Admitting: Occupational Therapy

## 2021-09-23 ENCOUNTER — Ambulatory Visit: Payer: Medicare Other | Admitting: Physical Therapy

## 2021-09-23 ENCOUNTER — Encounter: Payer: Self-pay | Admitting: Occupational Therapy

## 2021-09-23 ENCOUNTER — Ambulatory Visit: Payer: Medicare Other | Admitting: Speech Pathology

## 2021-09-23 DIAGNOSIS — M6281 Muscle weakness (generalized): Secondary | ICD-10-CM | POA: Insufficient documentation

## 2021-09-23 DIAGNOSIS — R41841 Cognitive communication deficit: Secondary | ICD-10-CM

## 2021-09-23 DIAGNOSIS — R2681 Unsteadiness on feet: Secondary | ICD-10-CM | POA: Diagnosis present

## 2021-09-23 NOTE — Therapy (Signed)
?OUTPATIENT SPEECH LANGUAGE PATHOLOGY TREATMENT NOTE ? ? ?Patient Name: Lauren Conner ?MRN: [id number] ?DOB:August 23, 1940, 81 y.o., female ?Today's Date: 09/23/2021 ? ?PCP: [facility] ?REFERRING PROVIDER: Cathlyn Parsons, PA-C  ? ? End of Session - 09/23/21 1219   ? ? Visit Number 10   ? Number of Visits 17   ? Date for SLP Re-Evaluation 10/29/21   ? Authorization Type Medicare   ? Progress Note Due on Visit 10   ? SLP Start Time 1230   ? SLP Stop Time  1313   ? SLP Time Calculation (min) 43 min   ? Activity Tolerance Patient tolerated treatment well   ? ?  ?  ? ?  ? ? ? ? ? ?Past Medical History:  ?Diagnosis Date  ? Hypertension   ? Renal disorder   ? ?No past surgical history on file. ?Patient Active Problem List  ? Diagnosis Date Noted  ? 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 Bronchiectasis with acute exacerbation ([place])   ? Encephalopathy acute 07/21/2021  ? Intracerebral hemorrhage 07/21/2021  ? Acute renal failure ([place])   ? Acute urinary retention 07/20/2021  ? Bilateral hydronephrosis 07/20/2021  ? Osteoporosis without current pathological fracture 07/20/2021  ? CKD (chronic kidney disease) stage 4, GFR 15-29 ml/min (HCC) 07/19/2021  ? Paroxysmal SVT (supraventricular tachycardia) ([place]) 07/19/2021  ? Severe sepsis ([place]) 07/19/2021  ? Pneumonia of both lungs due to infectious organism 07/19/2021  ? Acute metabolic encephalopathy 89/16/9450  ? 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 Metabolic acidosis 38/88/2800  ? Hyponatremia 07/19/2021  ? Essential hypertension 11/17/2018  ? Hypothyroidism (acquired) 12/06/2015  ? Adult bronchiectasis ([place]) 03/25/2011  ? ? ?ONSET DATE: 07/19/2021 ? ?REFERRING DIAG: I61.9 (ICD-10-CM) - Intraparenchymal hemorrhage of brain ([place])  ? ?THERAPY DIAG:  ?Cognitive communication deficit ? ?SUBJECTIVE: "that's getting better" re: gout  ? ?PAIN:  ?Are you having pain? No ? ?Speech Therapy Progress Note ? ?Dates of Reporting  Period: 08/06/2021 to 09/23/2021 ? ?Objective Reports of Subjective Statement: Pt has been seen for 10 speech therapy appointments, received skilled interventions targeting cognitive linguistic challenges post stroke. Ongoing education and training in attention/memory compensations, implementing compensations for improved participation and independence in preferred home activities such as medication, schedule, and financial management.  ? ?Objective Measurements: Moderate improvements demonstrated in attention, use of memory compensations, and executive functioning (organization, problem solving, planning)  ? ?Goal Update: see below ? ?Plan: continue according to POC ? ?Reason Skilled Services are Required: To promote participation and independence in preferred daily and avocational activities. Prior to stroke, pt highly independent. Implementation of strategies and compensations effective at improving competence and reducing caregiver burden.  ? ?OBJECTIVE:  ? ?TODAY'S TREATMENT: ?09-23-21: Pt tells ST she thinks her thinking has improved, but continues to experience slow mental processing. Target planning, problem solving, organization in planning upcoming mother's day meal. Pt able to determine with occasional mod-A cooking timeline, problem solve for proposed potential problems, reason how long [place] would cook, using calc for support. ID error in calculation and able to correct.  ? ?09-10-21: Pt reports that using tools generated last session she was able to balance her checkbook. Reports other increased participation of paying bills, cooking, doing small household chores. Led pt through cognitive exercise in which she determined amount of varying $ increments she would need to use to meet specified 3 digit amount. Use of printed money to modify task for success. Usual max-A, faded  to usual min-A, for accuracy of 10 problems. ID errors x2 during task. Update HEP with remaining 10 problems.  ? ?09-04-21: Pt reports  increased participation at home. Is doing more household chores-- dishes, making bed, laundry, showering, sweeping, grocery shopping, etc. Pt teaches back x2 memory compensation being utilized at home- a calendar, an ongoing grocery list, medication box, with high success. Pt demonstrates association internal memory strategy in session in recall of personal grocery list. Also demonstrating successful problem solving w/ grocery shopping. Ongoing education on using modifications/compensations for managing finances. Pt implemented ST strategies of using clean sheet of paper at home with success. Led pt through Doctor, hospital activity in which she used her receipts to write onto ledger and added them to see how much she had spent, using her personal calculator. Pt accurately completes task with rare min-A. Pt able to describe her bill paying system with mod-A from [place]. Generated strategies to streamline process and implement system to ensure all bills are paid when they need to be. Currently Thurza's children are supervising to ensure checks are written accurately.  ? ? ?PATIENT EDUCATION: ?Education details: see above ?Person educated: Patient  ?Education method: Explanation, Demonstration, and Handout ?Education comprehension: verbalized understanding, returned demonstration, and needs further education ?  ?  ?GOALS: ?Goals reviewed with patient? Yes ?  ?SHORT TERM GOALS: Target date: 09/03/2021 ?  ?Pt will complete CLQT and PROM within first 2 sessions. ?Baseline: initiated ?Goal status: GOAL MET  ? ?2.  Pt will accurately sort medications into weekly pill box with occasional min A over 2 sessions.  ?Baseline: medications being managed by care partner; 09/02/2021 ?Goal status: PARTIALLY MET ?  ?3.  Pt will report successful implementation at home of x2 attention/memory compensations with occasional mod A over 2 sessions.  ?Baseline: reports difficulty focusing; impaired memory observed in session; 08-18-2021,  09-02-2021 ?Goal status: GOAL MET ?  ?4.  Pt will demonstrate adeuqate organizational skills to complete mod complex therapy tasks with usual mod A over 2 sessions  ?Baseline: attention, ex fx skills inhibiting successful completion of tasks; 08-20-21, 09-04-2021 ?Goal status: GOAL PARTIALLY MET ?  ?5.  Pt will report increased household participation in x2 desired activities (shopping, meal prep, medication/financial management) with supervision from family members   ?Baseline: requires total A for ADLs and IADLS; 3-41-96, 07-10-95 ?Goal status: GOAL MET ?  ?  ?LONG TERM GOALS: Target date: 10/29/2021 ?  ?Pt will successfully manage household activities using external aids (to-do list, schedule, etc) with rare min A over 1 weeks  ?Baseline: total A for all ADLs ?Goal status: ONGOING ?  ?2.  Pt will successfully implement a bill paying system with rare min A over 1 week period to promote independence with managing finances ?Baseline: requires total A ?Goal status: ONGOING ?  ?3.  Pt will demonstrate adeuqate organizational skills to complete mod complex therapy tasks with modified independence (compensations) in 2 sessions  ?Baseline: attention, ex fx skills inhibiting successful completion of tasks ?Goal status: ONGOING ? ?4. Pt will demonstrate 5 pt improvement on Cognitive Fx - Abilities PROM by d/c.  ?Baseline: 89 ?Goal Status: ONGOING ?  ?  ?ASSESSMENT: ?  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 Pt presents with impairments in domains of executive function, attention, memory, and problem solving. Pt reports difficulty with simple every day tasks such as using remote or cell phone. Demonstrates intellectual and emergent awareness which causes frustration for  pt. Demonstrates poor frustration tolerance but is able to reengage given encouragement from [place] in session. Requires total A for ADLs and IADLs at this time, prior to acute event  pt was fully independent. Pt desires to return to living independently. Pt stated goals are to return to managing finances, managing medications, taking care of home to include household chores, and meal planning an

## 2021-09-23 NOTE — Therapy (Signed)
?OUTPATIENT OCCUPATIONAL THERAPY TREATMENT NOTE ? ? ?Patient Name: Lauren Conner ?MRN: [id number] ?DOB:1940/12/18, 81 y.o., female ?Today's Date: 08/08/2021 ? ?PCP: [facility] ?REFERRING PROVIDER: Dr. Naaman Plummer ? ? OT End of Session - 09/23/21 1109   ? ? Visit Number 9   ? Number of Visits 17   ? Date for OT Re-Evaluation 10/01/21   ? Authorization Type MCR, [employer]   ? Authorization - Visit Number 9   ? Authorization - Number of Visits 10   ? Progress Note Due on Visit 10   ? OT Start Time 1105   ? OT Stop Time 1135   ? OT Time Calculation (min) 30 min   ? Activity Tolerance Patient tolerated treatment well   ? Behavior During Therapy [facility] for tasks assessed/performed   ? ?  ?  ? ?  ? ? ? ? ?  ? ? ? ?Patient Active Problem List  ? Diagnosis Date Noted  ? 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 Bronchiectasis with acute exacerbation ([place])   ? Encephalopathy acute 07/21/2021  ? Intracerebral hemorrhage 07/21/2021  ? Acute renal failure ([place])   ? Acute urinary retention 07/20/2021  ? Bilateral hydronephrosis 07/20/2021  ? Osteoporosis without current pathological fracture 07/20/2021  ? CKD (chronic kidney disease) stage 4, GFR 15-29 ml/min (HCC) 07/19/2021  ? Paroxysmal SVT (supraventricular tachycardia) ([place]) 07/19/2021  ? Severe sepsis ([place]) 07/19/2021  ? Pneumonia of both lungs due to infectious organism 07/19/2021  ? Acute metabolic encephalopathy 83/66/2947  ? 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 Metabolic acidosis 65/46/5035  ? Hyponatremia 07/19/2021  ? Essential hypertension 11/17/2018  ? Hypothyroidism (acquired) 12/06/2015  ? Adult bronchiectasis ([place]) 03/25/2011  ? ? ?ONSET DATE: 07/19/21 ? ?REFERRING DIAG: I61.9 (ICD-10-CM) - Intraparenchymal hemorrhage of brain ([place])  ? ?THERAPY DIAG:  ?Left homonymous hemianopsia -  ?Unsteadiness on feet - ?Muscle weakness (generalized) -  ?Other symptoms and signs involving cognitive functions following cerebral  infarction  ? ?SUBJECTIVE:  ? ?SUBJECTIVE STATEMENT:I had a gout flare up, but I'm better now.  ? ? ?PERTINENT HISTORY: ARBIE REISZ is a 81 y.o. right-handed female with history of anemia of chronic disease, paroxysmal SVT hypothyroidism hypertension CKD stage IV followed by Dr.Lehrich with nephrology at [facility] with creatinine baseline 2.0.  ? ?PRECAUTIONS: Fall and Other: no driving, abdominal binder d/t orthostatic hypotension, foley catheter, and Lt visual field cut ? ?WEIGHT BEARING RESTRICTIONS No ? ? ? ?PAIN:  ?Are you having pain? No ? ? ? ? ?OBJECTIVE:  ? ?Pt simulating IADL tasks I'ly w/o LOB including: carrying laundry basket, simulating laundry task, making bed.  ? ?UBE x 5 min. Level 1 for UB endurance/conditioning ? ?Environmental scanning finding  12/12 items on first pass (100%) in busy gym ? ?Assessed remaining goals and progress to date - see below ? ? ? ? ?08/20/21: PATIENT EDUCATION: ?Education details: HEP for BUE's for UB endurance ?Person educated: Patient ?Education method: Explanation, Demonstration, Verbal cues, and Handouts ?Education comprehension: verbalized understanding, returned demonstration, and verbal cues required ? ? ?08/19/21: PATIENT EDUCATION:  ?Education details: memory strategies ?Person educated: Patient ?Education method: Explanation, Verbal cues, and Handouts ?Education comprehension: verbalized understanding and needs further education ? ? ? ? ?GOALS: ? ? ?SHORT TERM GOALS: Target date: 09/03/2021 ? ?Pt will be independent with HEP targeting overall endurance ? ?Baseline: n/a ?Goal status: MET (issued 08/20/21) ? ?2.  Pt to verbalize understanding with visual  scanning strategies ?Baseline: dep ?Goal status: MET (handout issued 08/08/21) ? ?3.  Pt will perform basic snack prep w/ supervision  ?Baseline: dep ?Goal status: MET pt reports getting fruit and making cereal ? ?4.  Pt to perform tabletop scanning attending to Lt side 75% or greater ?Baseline: dep ?Goal  status: MET ? ?5.  Pt to verbalize understanding with memory compensatory strategies ?Baseline: dep ?Goal status: MET, pt verbalizes understanding ? ? ? ?LONG TERM GOALS: Target date: 10/01/2021 ? ?Pt will perform simple stovetop cooking task and light housekeeping at mod I and good safety awareness ?Baseline: dep ?Goal status: PARTIALLY MET- recommending distant supervision for cooking, Mod I for light housekeeping ? ?2.  Pt will perform environmental scanning with 85% accuracy in a min distracting environment.   ?Baseline: dep ?Goal status: MET ? ?3.  Pt to perform BADLS I'ly  ?Baseline: min assist for shower transfers and dressing ?Goal status: MET ? ?4.  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 ?5.  Pt to perform dynamic standing tasks safely w/o LOB ?Baseline: unsteadiness ?Goal status: PARTIALLY MET - pt w/ mild LOB with turns during cooking task only ? ? ?ASSESSMENT: ? ?CLINICAL IMPRESSION: ?Pt has met all STG's and LTG's. Pt has made significant improvement since initial evaluation.  ? ?PERFORMANCE DEFICITS in functional skills including ADLs, IADLs, sensation, strength, FMC, mobility, balance, endurance, decreased knowledge of precautions, decreased knowledge of use of DME, vision, and UE functional use, cognitive skills including attention, memory, perception, problem solving, safety awareness, and sequencing,  ? ?IMPAIRMENTS are limiting patient from ADLs, IADLs, and social participation.  ? ?COMORBIDITIES may have co-morbidities  that affects occupational performance. Patient will benefit from skilled OT to address above impairments and improve overall function. ? ?MODIFICATION OR ASSISTANCE TO COMPLETE EVALUATION: Min-Moderate modification of tasks or assist with assess necessary to complete an evaluation. ? ?OT OCCUPATIONAL PROFILE AND HISTORY: Detailed assessment: Review of records and  additional review of physical, cognitive, psychosocial history related to current functional performance. ? ?CLINICAL DECISION MAKING: Moderate - several treatment options, min-mod task modification necessary ? ?REHAB POTENTIAL: Good ? ?EVALUATION COMPLEXITY: Moderate ? ? ? ?PLAN: ?OT FREQUENCY: 2x/week ? ?OT DURATION: 8 weeks ? ?PLANNED INTERVENTIONS: self care/ADL training, therapeutic exercise, therapeutic activity, neuromuscular re-education, manual therapy, functional mobility training, patient/family education, cognitive remediation/compensation, visual/perceptual remediation/compensation, coping strategies training, and DME and/or AE instructions ? ?RECOMMENDED OTHER SERVICES: n/a ? ?CONSULTED AND AGREED WITH PLAN OF CARE: Patient  ? ?PLAN :  D/C O.T.  ? ? ?OCCUPATIONAL THERAPY DISCHARGE SUMMARY ? ?Visits from Start of Care: 9 ? ?Current functional level related to goals / functional outcomes: ?See above ?  ?Remaining deficits: ?Executive functioning skills ?  ?Education / Equipment: ?HEP's, memory strategies  ? ?Patient agrees to discharge. Patient goals were met. Patient is being discharged due to meeting the stated rehab goals.. ? ? ? ? ?Redmond Baseman, OTR/L ?09/23/21 11:42 AM ?Phone [phone] ?FAX [phone] ? ? ? ? ?

## 2021-09-24 ENCOUNTER — Encounter: Payer: Medicare Other | Admitting: Occupational Therapy

## 2021-09-24 ENCOUNTER — Ambulatory Visit: Payer: Medicare Other

## 2021-09-24 ENCOUNTER — Encounter: Payer: Medicare Other | Admitting: Speech Pathology

## 2021-09-24 ENCOUNTER — Ambulatory Visit: Payer: Medicare Other | Admitting: Occupational Therapy

## 2021-09-30 ENCOUNTER — Encounter: Payer: Medicare Other | Admitting: Speech Pathology

## 2021-09-30 ENCOUNTER — Ambulatory Visit: Payer: Medicare Other

## 2021-10-01 ENCOUNTER — Ambulatory Visit: Payer: Medicare Other | Admitting: Physical Therapy

## 2021-10-01 ENCOUNTER — Ambulatory Visit: Payer: Medicare Other

## 2021-10-01 ENCOUNTER — Encounter: Payer: Medicare Other | Admitting: Speech Pathology

## 2021-10-01 ENCOUNTER — Encounter: Payer: Medicare Other | Admitting: Occupational Therapy

## 2021-10-01 DIAGNOSIS — M6281 Muscle weakness (generalized): Secondary | ICD-10-CM | POA: Diagnosis not present

## 2021-10-01 DIAGNOSIS — R41841 Cognitive communication deficit: Secondary | ICD-10-CM

## 2021-10-01 NOTE — Therapy (Signed)
?OUTPATIENT SPEECH LANGUAGE PATHOLOGY TREATMENT NOTE ? ? ?Patient Name: Lauren Conner ?MRN: [id number] ?DOB:[birth year], 81 y.o., female ?Today's Date: 10/01/2021 ? ?PCP: [facility] ?REFERRING PROVIDER: Cathlyn Parsons, PA-C  ? ? End of Session - 10/01/21 0959   ? ? Visit Number 11   ? Number of Visits 17   ? Date for SLP Re-Evaluation 10/29/21   ? Authorization Type Medicare   ? SLP Start Time 1000   ? SLP Stop Time  1045   ? SLP Time Calculation (min) 45 min   ? Activity Tolerance Patient tolerated treatment well   ? ?  ?  ? ?  ? ? ? ? ? ?Past Medical History:  ?Diagnosis Date  ? Hypertension   ? Renal disorder   ? ?No past surgical history on file. ?Patient Active Problem List  ? Diagnosis Date Noted  ? 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 Bronchiectasis with acute exacerbation ([place])   ? Encephalopathy acute 07/21/2021  ? Intracerebral hemorrhage 07/21/2021  ? Acute renal failure ([place])   ? Acute urinary retention 07/20/2021  ? Bilateral hydronephrosis 07/20/2021  ? Osteoporosis without current pathological fracture 07/20/2021  ? CKD (chronic kidney disease) stage 4, GFR 15-29 ml/min ([facility]) 07/19/2021  ? Paroxysmal SVT (supraventricular tachycardia) ([place]) 07/19/2021  ? Severe sepsis ([place]) 07/19/2021  ? Pneumonia of both lungs due to infectious organism 07/19/2021  ? Acute metabolic encephalopathy 48/25/0037  ? Anemia of chronic kidney failure, stage 4 (severe) ([place]) 07/19/2021  ? Metabolic acidosis 04/88/8916  ? Hyponatremia 07/19/2021  ? Essential hypertension 11/17/2018  ? Hypothyroidism (acquired) 12/06/2015  ? Adult bronchiectasis ([place]) 03/25/2011  ? ? ?ONSET DATE: 07/19/2021 ? ?REFERRING DIAG: I61.9 (ICD-10-CM) - Intraparenchymal hemorrhage of brain ([place])  ? ?THERAPY DIAG:  ?Cognitive communication deficit ? ?SUBJECTIVE: "things are getting better"  ? ?PAIN:  ?Are you having pain? No ? ?OBJECTIVE:  ? ?TODAY'S TREATMENT: ?10-01-21: Pt reports significant improvements in overall  recovery with return to usual activities given support from family. Pt endorsed some ongoing difficulty with functional calculations, which improves with use of calculator. SLP targeted functional math language and problem solving for time management, with usual extended processing time, frequent repetition of targeted task, and occasional mod to max A required to aid calculations (ex: time difference between 6 AM & 6:45 AM). Visual cues were most helpful to aid patient processing and calculations. Pt will complete remainder for HEP.  ? ?09-23-21: Pt tells ST she thinks her thinking has improved, but continues to experience slow mental processing. Target planning, problem solving, organization in planning upcoming mother's day meal. Pt able to determine with occasional mod-A cooking timeline, problem solve for proposed potential problems, reason how long [place] would cook, using calc for support. ID error in calculation and able to correct.  ? ?09-10-21: Pt reports that using tools generated last session she was able to balance her checkbook. Reports other increased participation of paying bills, cooking, doing small household chores. Led pt through cognitive exercise in which she determined amount of varying $ increments she would need to use to meet specified 3 digit amount. Use of printed money to modify task for success. Usual max-A, faded to usual min-A, for accuracy of 10 problems. ID errors x2 during task. Update HEP with remaining 10 problems.  ? ?09-04-21: Pt reports increased participation at home. Is doing more household chores-- dishes, making bed, laundry, showering, sweeping, grocery shopping, etc. Pt teaches back x2 memory compensation being utilized at  home- a calendar, an ongoing grocery list, medication box, with high success. Pt demonstrates association internal memory strategy in session in recall of personal grocery list. Also demonstrating successful problem solving w/ grocery shopping. Ongoing  education on using modifications/compensations for managing finances. Pt implemented ST strategies of using clean sheet of paper at home with success. Led pt through Doctor, hospital activity in which she used her receipts to write onto ledger and added them to see how much she had spent, using her personal calculator. Pt accurately completes task with rare min-A. Pt able to describe her bill paying system with mod-A from [place]. Generated strategies to streamline process and implement system to ensure all bills are paid when they need to be. Currently Lauren Conner are supervising to ensure checks are written accurately.  ? ? ?PATIENT EDUCATION: ?Education details: see above ?Person educated: Patient  ?Education method: Explanation, Demonstration, and Handout ?Education comprehension: verbalized understanding, returned demonstration, and needs further education ?  ?  ?GOALS: ?Goals reviewed with patient? Yes ?  ?SHORT TERM GOALS: Target date: 09/03/2021 ?  ?Pt will complete CLQT and PROM within first 2 sessions. ?Baseline: initiated ?Goal status: GOAL MET  ? ?2.  Pt will accurately sort medications into weekly pill box with occasional min A over 2 sessions.  ?Baseline: medications being managed by care partner; 09/02/2021 ?Goal status: PARTIALLY MET ?  ?3.  Pt will report successful implementation at home of x2 attention/memory compensations with occasional mod A over 2 sessions.  ?Baseline: reports difficulty focusing; impaired memory observed in session; 08-18-2021, 09-02-2021 ?Goal status: GOAL MET ?  ?4.  Pt will demonstrate adeuqate organizational skills to complete mod complex therapy tasks with usual mod A over 2 sessions  ?Baseline: attention, ex fx skills inhibiting successful completion of tasks; 08-20-21, 09-04-2021 ?Goal status: GOAL PARTIALLY MET ?  ?5.  Pt will report increased household participation in x2 desired activities (shopping, meal prep, medication/financial management) with supervision from family  members   ?Baseline: requires total A for ADLs and IADLS; 3-54-65, 6-81-27 ?Goal status: GOAL MET ?  ?  ?LONG TERM GOALS: Target date: 10/29/2021 ?  ?Pt will successfully manage household activities using external aids (to-do list, schedule, etc) with rare min A over 1 weeks  ?Baseline: total A for all ADLs ?Goal status: ONGOING ?  ?2.  Pt will successfully implement a bill paying system with rare min A over 1 week period to promote independence with managing finances ?Baseline: requires total A ?Goal status: ONGOING ?  ?3.  Pt will demonstrate adeuqate organizational skills to complete mod complex therapy tasks with modified independence (compensations) in 2 sessions  ?Baseline: attention, ex fx skills inhibiting successful completion of tasks ?Goal status: ONGOING ? ?4. Pt will demonstrate 5 pt improvement on Cognitive Fx - Abilities PROM by d/c.  ?Baseline: 89 ?Goal Status: ONGOING ?  ?  ?ASSESSMENT: ?  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 Addressed personal goals to return to managing finances, managing medications, taking care of home to include household chores, and meal planning and grocery shopping for self. Increasing functional independence reported. Less A reportedly required for completion of functional cognitive tasks compared to initial ST sessions.  ?  ?OBJECTIVE IMPAIRMENTS include attention, memory, and executive functioning. These impairments are limiting patient from managing medications, managing appointments, managing finances, household responsibilities, and ADLs/IADLs. ?Patient will benefit from skilled SLP services to address above impairments and improve overall function. ?  ?  REHAB POTENTIAL: Good ?  ?PLAN: ?SLP FREQUENCY: 2x/week ?  ?SLP DURATION: 12 weeks ?  ?PLANNED INTERVENTIONS: Environmental controls, Cueing hierachy, Cognitive reorganization, Internal/external aids, Functional tasks, SLP  instruction and feedback, and Compensatory strategies ?  ?Marzetta Board, CCC-SLP  ?10/01/2021, 9:59 AM ?  ?

## 2021-10-02 ENCOUNTER — Inpatient Hospital Stay: Payer: Medicare Other | Admitting: Neurology

## 2021-10-02 ENCOUNTER — Telehealth: Payer: Self-pay | Admitting: Neurology

## 2021-10-02 ENCOUNTER — Encounter: Payer: Medicare Other | Admitting: Speech Pathology

## 2021-10-02 ENCOUNTER — Ambulatory Visit: Payer: Medicare Other

## 2021-10-02 ENCOUNTER — Ambulatory Visit (INDEPENDENT_AMBULATORY_CARE_PROVIDER_SITE_OTHER): Payer: Medicare Other | Admitting: Neurology

## 2021-10-02 ENCOUNTER — Encounter: Payer: Self-pay | Admitting: Neurology

## 2021-10-02 VITALS — BP 139/80 | HR 65 | Ht 63.0 in | Wt 116.6 lb

## 2021-10-02 DIAGNOSIS — G441 Vascular headache, not elsewhere classified: Secondary | ICD-10-CM

## 2021-10-02 DIAGNOSIS — I611 Nontraumatic intracerebral hemorrhage in hemisphere, cortical: Secondary | ICD-10-CM | POA: Diagnosis not present

## 2021-10-02 NOTE — Progress Notes (Signed)
[facility] [address] [place]. [place] [address] [phone]       OFFICE CONSULT NOTE  [name] Date of Birth:  12-13-1940 Medical Record Number:  [id number]   Referring MD:  Marlowe [place], PA-c  Reason for Referral:  brain hemorrhgae  HPI: Lauren Conner is a pleasant 81 year old Caucasian lady seen today for initial office consultation visit.  She is accompanied by her son and daughter-in-law.  History is obtained from them and review of electronic medical records and personally reviewed pertinent available imaging films in PACS.  She has past medical history of hypertension and renal insufficiency.  She was admitted to [facility] on 07/21/2021 with confusion and right-sided weakness.  The patient was unable to give a clear history but on arrival was found to be febrile with temperature of 100 and 0.5, tachycardic with heart rate in the 120s and tachypneic.  Blood pressure was elevated at 154/121.  She had mild leukocytosis of 17,000 with elevated lactic acid of 3.5 and creatinine of 2.13 which is slightly above her baseline creatinine of 2.  Chest x-ray showed patchy airspace opacities throughout the right lung and left lung base possible multifocal pneumonia.  CT head showed a large acute parenchymal hemorrhage in the right temporoparietal lobe measuring 4 x 4 x 4.1 cm with surrounding vasogenic edema.  Small amount of adjacent subarachnoid blood was also noted.  There is no intraventricular extension but there was 3 mm right to left midline shift.  MRI scan showed stable appearance of the hematoma with no definite underlying mass though contrast could not be given due to her renal function.  MR angiogram showed no aneurysms or large vessel stenosis or occlusion.  Patient was kept in the intensive care unit and blood pressure was tightly controlled.  Etiology of the hemorrhage was felt to be unclear possibly traumatic versus primary hypertensive she was found to  have some dysarthria with difficulty naming of poor attention concentration and left-sided neglect on exam.  NIH stroke scale was 18.  She was transferred to inpatient rehab and did well.  She is presently living at home.  She is independent in most activities of daily living.  She just finished outpatient physical and Occupational Therapy.  Her blood pressure is under good control she is only on Inderal and tolerating it well.  Blood pressure today in the office is 139/80.  She has recently discontinued amlodipine and midodrine.  Patient complains of occasional headaches once or twice a week.  These are mild and respond to Tylenol quite well.  She is is bothered by her decreased vision which is from cataracts and she plans to have cataract surgery soon.  She has no other complaints.  She had a repeat CT scan of the head on 08/22/2021 which showed trace residual subarachnoid hemorrhage on the right which was felt to be resolving previous subarachnoid blood and nothing new.  Lower extremity venous Dopplers on 09/14/2021 were negative for DVT. ROS:   14 system review of systems is positive for confusion, weakness, gait difficulty, headaches, decreased vision all other systems negative  PMH:  Past Medical History:  Diagnosis Date   Hypertension    Renal disorder     Social History:  Social History   Socioeconomic History   Marital status: Widowed    Spouse name: Not on file   Number of children: Not on file   Years of education: Not on file   Highest education level: Not  on file  Occupational History   Not on file  Tobacco Use   Smoking status: Never   Smokeless tobacco: Never  Substance and Sexual Activity   Alcohol use: Never   Drug use: Never   Sexual activity: Not on file  Other Topics Concern   Not on file  Social History Narrative   Not on file   Social Determinants of Health   Financial Resource Strain: Not on file  Food Insecurity: Not on file  Transportation Needs: Not on  file  Physical Activity: Not on file  Stress: Not on file  Social Connections: Not on file  Intimate Partner Violence: Not on file    Medications:   Current Outpatient Medications on File Prior to Visit  Medication Sig Dispense Refill   acetaminophen (TYLENOL) 325 MG tablet Take 2 tablets (650 mg total) by mouth every 6 (six) hours as needed. (Patient taking differently: Take 650 mg by mouth every 6 (six) hours as needed for mild pain.)     albuterol (VENTOLIN HFA) 108 (90 Base) MCG/ACT inhaler Inhale 2 puffs into the lungs every 4 (four) hours as needed for wheezing. 8 g 0   arformoterol (BROVANA) 15 MCG/2ML NEBU Take 2 mLs (15 mcg total) by nebulization 2 (two) times daily. 120 mL 0   levothyroxine (SYNTHROID) 100 MCG tablet Take 1 tablet (100 mcg total) by mouth daily at 6 (six) AM. 30 tablet 0   loratadine (CLARITIN) 10 MG tablet Take 1 tablet (10 mg total) by mouth daily. 30 tablet 0   pantoprazole (PROTONIX) 40 MG tablet Take 1 tablet (40 mg total) by mouth daily. 30 tablet 0   polyethylene glycol (MIRALAX / GLYCOLAX) 17 g packet Take 17 g by mouth daily. 14 each 0   propranolol (INDERAL) 10 MG tablet Take 10 mg by mouth 3 (three) times daily.     revefenacin (YUPELRI) 175 MCG/3ML nebulizer solution Take 3 mLs (175 mcg total) by nebulization daily. 90 mL 0   senna-docusate (SENOKOT-S) 8.6-50 MG tablet Take 1 tablet by mouth 2 (two) times daily.     sodium bicarbonate 650 MG tablet Take 1 tablet (650 mg total) by mouth 3 (three) times daily. 90 tablet 0   sodium chloride (OCEAN) 0.65 % SOLN nasal spray Place 1 spray into both nostrils as needed for congestion. (Patient taking differently: Place 1 spray into both nostrils daily as needed for congestion.)  0   tamsulosin (FLOMAX) 0.4 MG CAPS capsule Take 1 capsule (0.4 mg total) by mouth daily. 30 capsule 0   traMADol (ULTRAM) 50 MG tablet Take 1 tablet (50 mg total) by mouth every 6 (six) hours as needed. (Patient taking differently: Take  50 mg by mouth as needed.) 15 tablet 0   amLODipine (NORVASC) 2.5 MG tablet Take 2 tablets (5 mg total) by mouth daily. (Patient not taking: Reported on 10/02/2021) 30 tablet 0   midodrine (PROAMATINE) 2.5 MG tablet Take 1 tablet (2.5 mg total) by mouth 2 (two) times daily with breakfast and lunch. (Patient not taking: Reported on 10/02/2021) 60 tablet 0   No current facility-administered medications on file prior to visit.    Allergies:   Allergies  Allergen Reactions   Ethambutol Other (See Comments)    Patient reports cramping   Iodinated Contrast Media Rash    Physical Exam General: Pleasant frail elderly Caucasian lady, seated, in no evident distress Head: head normocephalic and atraumatic.   Neck: supple with no carotid or supraclavicular bruits Cardiovascular: regular  rate and rhythm, no murmurs Musculoskeletal: no deformity Skin:  no rash/petichiae Vascular:  Normal pulses all extremities  Neurologic Exam Mental Status: Awake and fully alert. Oriented to place and time. Recent and remote memory intact. Attention span, concentration and fund of knowledge appropriate. Mood and affect appropriate.  Diminished attention, registration and recall.  Follows commands well.  No aphasia apraxia or dysarthria Cranial Nerves: Fundoscopic exam reveals sharp disc margins. Pupils equal, briskly reactive to light. Extraocular movements full without nystagmus. Visual fields full to confrontation. Hearing intact. Facial sensation intact. Face, tongue, palate moves normally and symmetrically.  Motor: Normal bulk and tone. Normal strength in all tested extremity muscles. Sensory.: intact to touch , pinprick , position and vibratory sensation.  Coordination: Rapid alternating movements normal in all extremities. Finger-to-nose and heel-to-shin performed accurately bilaterally. Gait and Station: Arises from chair without difficulty. Stance is normal. Gait demonstrates normal stride length and balance .  Able to heel, toe and tandem walk with moderate difficulty.  Reflexes: 1+ and symmetric. Toes downgoing.   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  Possibly hypertensive     PLAN:I had a long discussion with the patient, his son and daughter-in-law about her recent intracerebral hemorrhage and discussed results of hospital evaluation and imaging studies and answered questions.  I recommend we do a follow-up MRI scan to look for any small vascular structural lesion which was not seen on initial imaging.  Continue strict control of hypertension with blood pressure goal below 140/90.  She was also advised to discontinue Topamax as her headaches seem to have improved.  She may continue to use Tylenol as needed.  She will return for follow-up in the future in 3 months or call earlier if necessary. Greater than 50% time during this 45-minute consultation visit was spent on counseling and coordination of care about her intracerebral hemorrhage and answering questions. Antony Contras, MD Note: This document was prepared with digital dictation and possible smart phrase technology. Any transcriptional errors that result from this process are unintentional.

## 2021-10-02 NOTE — Patient Instructions (Signed)
I had a long discussion with the patient, his son and daughter-in-law about her recent intracerebral hemorrhage and discussed results of hospital evaluation and imaging studies and answered questions.  I recommend we do a follow-up MRI scan to look for any small vascular structural lesion which was not seen on initial imaging.  Continue strict control of hypertension with blood pressure goal below 140/90.  She was also advised to discontinue Topamax as her headaches seem to have improved.  She may continue to use Tylenol as needed.  She will return for follow-up in the future in 3 months or call earlier if necessary. Stroke Prevention Some medical conditions and behaviors can lead to a higher chance of having a stroke. You can help prevent a stroke by eating healthy, exercising, not smoking, and managing any medical conditions you have. Stroke is a leading cause of functional impairment. Primary prevention is particularly important because a majority of strokes are first-time events. Stroke changes the lives of not only those who experience a stroke but also their family and other caregivers. How can this condition affect me? A stroke is a medical emergency and should be treated right away. A stroke can lead to brain damage and can sometimes be life-threatening. If a person gets medical treatment right away, there is a better chance of surviving and recovering from a stroke. What can increase my risk? The following medical conditions may increase your risk of a stroke: Cardiovascular disease. High blood pressure (hypertension). Diabetes. High cholesterol. Sickle cell disease. Blood clotting disorders (hypercoagulable state). Obesity. Sleep disorders (obstructive sleep apnea). Other risk factors include: Being older than age 30. Having a history of blood clots, stroke, or mini-stroke (transient ischemic attack, TIA). Genetic factors, such as race, ethnicity, or a family history of stroke. Smoking  cigarettes or using other tobacco products. Taking birth control pills, especially if you also use tobacco. Heavy use of alcohol or drugs, especially cocaine and methamphetamine. Physical inactivity. What actions can I take to prevent this? Manage your health conditions High cholesterol levels. Eating a healthy diet is important for preventing high cholesterol. If cholesterol cannot be managed through diet alone, you may need to take medicines. Take any prescribed medicines to control your cholesterol as told by your health care provider. Hypertension. To reduce your risk of stroke, try to keep your blood pressure below 130/80. Eating a healthy diet and exercising regularly are important for controlling blood pressure. If these steps are not enough to manage your blood pressure, you may need to take medicines. Take any prescribed medicines to control hypertension as told by your health care provider. Ask your health care provider if you should monitor your blood pressure at home. Have your blood pressure checked every year, even if your blood pressure is normal. Blood pressure increases with age and some medical conditions. Diabetes. Eating a healthy diet and exercising regularly are important parts of managing your blood sugar (glucose). If your blood sugar cannot be managed through diet and exercise, you may need to take medicines. Take any prescribed medicines to control your diabetes as told by your health care provider. Get evaluated for obstructive sleep apnea. Talk to your health care provider about getting a sleep evaluation if you snore a lot or have excessive sleepiness. Make sure that any other medical conditions you have, such as atrial fibrillation or atherosclerosis, are managed. Nutrition Follow instructions from your health care provider about what to eat or drink to help manage your health condition. These instructions may  include: Reducing your daily calorie intake. Limiting  how much salt (sodium) you use to 1,500 milligrams (mg) each day. Using only healthy fats for cooking, such as olive oil, canola oil, or sunflower oil. Eating healthy foods. You can do this by: Choosing foods that are high in fiber, such as whole grains, and fresh fruits and vegetables. Eating at least 5 servings of fruits and vegetables a day. Try to fill one-half of your plate with fruits and vegetables at each meal. Choosing lean protein foods, such as lean cuts of meat, poultry without skin, fish, tofu, beans, and nuts. Eating low-fat dairy products. Avoiding foods that are high in sodium. This can help lower blood pressure. Avoiding foods that have saturated fat, trans fat, and cholesterol. This can help prevent high cholesterol. Avoiding processed and prepared foods. Counting your daily carbohydrate intake.  Lifestyle If you drink alcohol: Limit how much you have to: 0-1 drink a day for women who are not pregnant. 0-2 drinks a day for men. Know how much alcohol is in your drink. In the [place] one drink equals one 12 oz bottle of beer (3105mL), one 5 oz glass of wine (17mL), or one 1 oz glass of hard liquor (31mL). Do not use any products that contain nicotine or tobacco. These products include cigarettes, chewing tobacco, and vaping devices, such as e-cigarettes. If you need help quitting, ask your health care provider. Avoid secondhand smoke. Do not use drugs. Activity  Try to stay at a healthy weight. Get at least 30 minutes of exercise on most days, such as: Fast walking. Biking. Swimming. Medicines Take over-the-counter and prescription medicines only as told by your health care provider. Aspirin or blood thinners (antiplatelets or anticoagulants) may be recommended to reduce your risk of forming blood clots that can lead to stroke. Avoid taking birth control pills. Talk to your health care provider about the risks of taking birth control pills if: You are over 34 years  old. You smoke. You get very bad headaches. You have had a blood clot. Where to find more information American Stroke Association: www.strokeassociation.org Get help right away if: You or a loved one has any symptoms of a stroke. "BE FAST" is an easy way to remember the main warning signs of a stroke: B - Balance. Signs are dizziness, sudden trouble walking, or loss of balance. E - Eyes. Signs are trouble seeing or a sudden change in vision. F - Face. Signs are sudden weakness or numbness of the face, or the face or eyelid drooping on one side. A - Arms. Signs are weakness or numbness in an arm. This happens suddenly and usually on one side of the body. S - Speech. Signs are sudden trouble speaking, slurred speech, or trouble understanding what people say. T - Time. Time to call emergency services. Write down what time symptoms started. You or a loved one has other signs of a stroke, such as: A sudden, severe headache with no known cause. Nausea or vomiting. Seizure. These symptoms may represent a serious problem that is an emergency. Do not wait to see if the symptoms will go away. Get medical help right away. Call your local emergency services (911 in the [place] Do not drive yourself to the hospital. Summary You can help to prevent a stroke by eating healthy, exercising, not smoking, limiting alcohol intake, and managing any medical conditions you may have. Do not use any products that contain nicotine or tobacco. These include cigarettes, chewing tobacco, and vaping  devices, such as e-cigarettes. If you need help quitting, ask your health care provider. Remember "BE FAST" for warning signs of a stroke. Get help right away if you or a loved one has any of these signs. This information is not intended to replace advice given to you by your health care provider. Make sure you discuss any questions you have with your health care provider. Document Revised: 12/04/2019 Document Reviewed:  12/04/2019 Elsevier Patient Education  [place].

## 2021-10-02 NOTE — Telephone Encounter (Signed)
medicare/tricare NPR sent to [facility] they will call the patient to schedule

## 2021-10-06 ENCOUNTER — Encounter: Payer: Medicare Other | Admitting: Speech Pathology

## 2021-10-06 ENCOUNTER — Ambulatory Visit: Payer: Medicare Other

## 2021-10-08 ENCOUNTER — Ambulatory Visit: Payer: Medicare Other | Admitting: Physical Therapy

## 2021-10-08 ENCOUNTER — Encounter: Payer: Medicare Other | Admitting: Occupational Therapy

## 2021-10-08 ENCOUNTER — Ambulatory Visit: Payer: Medicare Other

## 2021-10-08 ENCOUNTER — Ambulatory Visit: Payer: Medicare Other | Admitting: Speech Pathology

## 2021-10-08 ENCOUNTER — Encounter: Payer: Medicare Other | Admitting: Speech Pathology

## 2021-10-08 DIAGNOSIS — M6281 Muscle weakness (generalized): Secondary | ICD-10-CM | POA: Diagnosis not present

## 2021-10-08 DIAGNOSIS — R41841 Cognitive communication deficit: Secondary | ICD-10-CM

## 2021-10-08 NOTE — Therapy (Signed)
OUTPATIENT SPEECH LANGUAGE PATHOLOGY TREATMENT NOTE   Patient Name: Lauren Conner MRN: [id number] DOB:1940-10-17, 81 y.o., female Today's Date: 10/08/2021  PCP: [facility] REFERRING PROVIDER: Cathlyn Parsons, PA-C    End of Session - 10/08/21 1014     Visit Number 12    Number of Visits 17    Date for SLP Re-Evaluation 10/29/21    Authorization Type Medicare    SLP Start Time 1011    SLP Stop Time  1049    SLP Time Calculation (min) 38 min    Activity Tolerance Patient tolerated treatment well                 Past Medical History:  Diagnosis Date   Hypertension    Renal disorder    No past surgical history on file. Patient Active Problem List   Diagnosis Date Noted   Intraparenchymal hemorrhage of brain ([place]) 07/26/2021   Bronchiectasis with acute exacerbation ([place])    Encephalopathy acute 07/21/2021   Intracerebral hemorrhage 07/21/2021   Acute renal failure ([facility])    Acute urinary retention 07/20/2021   Bilateral hydronephrosis 07/20/2021   Osteoporosis without current pathological fracture 07/20/2021   CKD (chronic kidney disease) stage 4, GFR 15-29 ml/min ([facility]) 07/19/2021   Paroxysmal SVT (supraventricular tachycardia) ([place]) 07/19/2021   Severe sepsis ([place]) 07/19/2021   Pneumonia of both lungs due to infectious organism 37/62/8315   Acute metabolic encephalopathy 17/61/6073   Anemia of chronic kidney failure, stage 4 (severe) ([place]) 71/10/2692   Metabolic acidosis 85/46/2703   Hyponatremia 07/19/2021   Essential hypertension 11/17/2018   Hypothyroidism (acquired) 12/06/2015   Adult bronchiectasis ([place]) 03/25/2011    ONSET DATE: 07/19/2021  REFERRING DIAG: I61.9 (ICD-10-CM) - Intraparenchymal hemorrhage of brain ([place])   THERAPY DIAG:  Cognitive communication deficit  SUBJECTIVE: "my blood pressure has been acting up"   PAIN:  Are you having pain? Yes Description: L side, 5/10  OBJECTIVE:   TODAY'S TREATMENT: 10-08-21: Pt  reports some depression 2/2 recovery course. Provided education on benefit of seeking help for managing emotions and validated emotional distress following serious medical event. Generated list of iADLs pt continues to require assistance with. She Id'd sorting medications, paying bills, dealing with emotions, schedule management, and cooking. Reviewed targeted compensations to address some functional needs- continuing to write all appointment into planner/calendar and reference daily, using grocery list for shopping, using calculator for math, using larger print ledger for keeping track of account. Pt endorses these strategies being helpful. Reports increased success with prospective memory tasks- gives examples of taking medications, recalling to start laundry when asked. SLP targeted attention to detail and calculator usage through math problems. IND asks for appropriate tool for task completion. Awareness of errors, mod-A for correction. Rare mod-A for attention to detail, using correct math function. Demonstrates adequate alternating attention for task, by successfully alternating from worksheet to calculator. Demonstrates adequate focused attention to task with some mild background noise.   10-01-21: Pt reports significant improvements in overall recovery with return to usual activities given support from family. Pt endorsed some ongoing difficulty with functional calculations, which improves with use of calculator. SLP targeted functional math language and problem solving for time management, with usual extended processing time, frequent repetition of targeted task, and occasional mod to max A required to aid calculations (ex: time difference between 6 AM & 6:45 AM). Visual cues were most helpful to aid patient processing and calculations. Pt will complete remainder for HEP.   09-23-21:  Pt tells ST she thinks her thinking has improved, but continues to experience slow mental processing. Target planning,  problem solving, organization in planning upcoming mother's day meal. Pt able to determine with occasional mod-A cooking timeline, problem solve for proposed potential problems, reason how long [place] would cook, using calc for support. ID error in calculation and able to correct.   PATIENT EDUCATION: Education details: see above Person educated: Patient  Education method: Explanation, Demonstration, and Handout Education comprehension: verbalized understanding, returned demonstration, and needs further education     GOALS: Goals reviewed with patient? Yes   SHORT TERM GOALS: Target date: 09/03/2021   Pt will complete CLQT and PROM within first 2 sessions. Baseline: initiated Goal status: GOAL MET   2.  Pt will accurately sort medications into weekly pill box with occasional min A over 2 sessions.  Baseline: medications being managed by care partner; 09/02/2021 Goal status: PARTIALLY MET   3.  Pt will report successful implementation at home of x2 attention/memory compensations with occasional mod A over 2 sessions.  Baseline: reports difficulty focusing; impaired memory observed in session; 08-18-2021, 09-02-2021 Goal status: GOAL MET   4.  Pt will demonstrate adeuqate organizational skills to complete mod complex therapy tasks with usual mod A over 2 sessions  Baseline: attention, ex fx skills inhibiting successful completion of tasks; 08-20-21, 09-04-2021 Goal status: GOAL PARTIALLY MET   5.  Pt will report increased household participation in x2 desired activities (shopping, meal prep, medication/financial management) with supervision from family members   Baseline: requires total A for ADLs and IADLS; 2-67-12, 4-58-09 Goal status: GOAL MET     LONG TERM GOALS: Target date: 10/29/2021   Pt will successfully manage household activities using external aids (to-do list, schedule, etc) with rare min A over 1 weeks  Baseline: total A for all ADLs; 10/08/2021 Goal status: ONGOING   2.   Pt will successfully implement a bill paying system with rare min A over 1 week period to promote independence with managing finances Baseline: requires total A Goal status: ONGOING   3.  Pt will demonstrate adeuqate organizational skills to complete mod complex therapy tasks with modified independence (compensations) in 2 sessions  Baseline: attention, ex fx skills inhibiting successful completion of tasks Goal status: ONGOING  4. Pt will demonstrate 5 pt improvement on Cognitive Fx - Abilities PROM by d/c.  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 Addressed personal goals to return to managing finances, managing medications, taking care of home to include household chores, and meal planning and grocery shopping for self. Increasing functional independence reported. Less A reportedly required for completion of functional cognitive tasks compared to initial ST sessions.    OBJECTIVE IMPAIRMENTS include attention, memory, and executive functioning. These impairments are limiting patient from managing medications, managing appointments, managing finances, household responsibilities, and ADLs/IADLs. Patient will benefit from skilled SLP services to address above impairments and improve overall function.   REHAB POTENTIAL: Good   PLAN: SLP FREQUENCY: 2x/week   SLP DURATION: 12 weeks   PLANNED INTERVENTIONS: Environmental controls, Cueing hierachy, Cognitive reorganization, Internal/external aids, Functional tasks, SLP instruction and feedback, and Compensatory strategies   Su Monks, CCC-SLP  10/08/2021, 10:52 AM

## 2021-10-09 ENCOUNTER — Ambulatory Visit: Payer: Medicare Other | Admitting: Physical Therapy

## 2021-10-09 ENCOUNTER — Encounter: Payer: Medicare Other | Admitting: Occupational Therapy

## 2021-10-09 ENCOUNTER — Ambulatory Visit: Payer: Medicare Other

## 2021-10-15 ENCOUNTER — Ambulatory Visit: Payer: Medicare Other

## 2021-10-15 ENCOUNTER — Encounter: Payer: Medicare Other | Admitting: Speech Pathology

## 2021-10-17 ENCOUNTER — Ambulatory Visit: Payer: Medicare Other

## 2021-10-17 ENCOUNTER — Ambulatory Visit
Admission: RE | Admit: 2021-10-17 | Discharge: 2021-10-17 | Disposition: A | Discharge status: Home / Self Care | Payer: Medicare Other | Source: Ambulatory Visit | Attending: Neurology | Admitting: Neurology

## 2021-10-17 ENCOUNTER — Encounter: Payer: Medicare Other | Admitting: Speech Pathology

## 2021-10-17 ENCOUNTER — Encounter: Payer: Medicare Other | Admitting: Occupational Therapy

## 2021-10-17 DIAGNOSIS — I611 Nontraumatic intracerebral hemorrhage in hemisphere, cortical: Secondary | ICD-10-CM | POA: Diagnosis not present

## 2021-10-17 IMAGING — MR MR HEAD W/O CM
11 series · 48 of 48 positions shown · non-contrast
Comparison: Head CT [DATE] and earlier.

CLINICAL DATA: 80-year-old female status post intra-axial right
hemisphere hemorrhage in [REDACTED]. Subsequent encounter.

EXAM:
MRI HEAD WITHOUT CONTRAST
TECHNIQUE: Multiplanar, multiecho pulse sequences of the brain and surrounding
structures were obtained without intravenous contrast.

[Series 5: T1 · sagittal · 4.0mm · 0.75mm/px · 2 of 30 slices shown (1 of 2)]
[im 1/30]
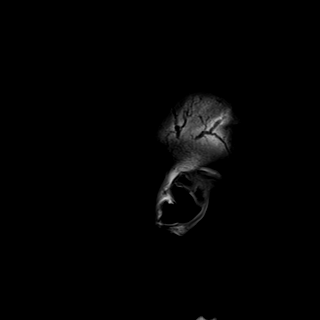
[im 30/30]
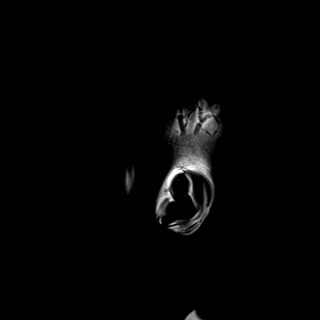

[Series 6: DWI · axial · 3.0mm · 0.94mm/px · z∈[-53,+104]mm · 11 of 176 slices shown (1 of 3)]
[im 1/176]
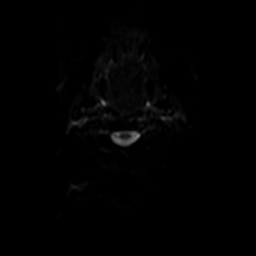
[im 18/176]
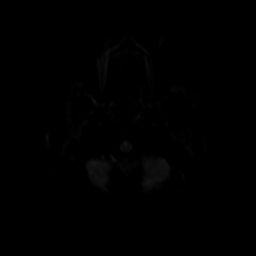
[im 36/176]
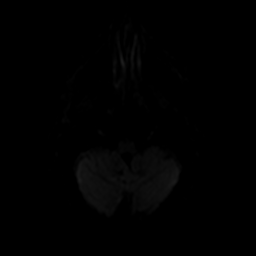
[im 53/176]
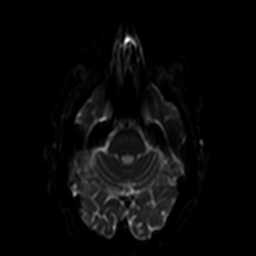
[im 71/176]
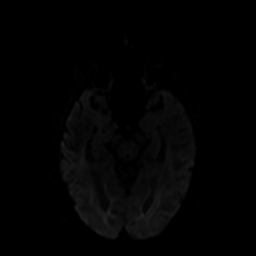
[im 88/176]
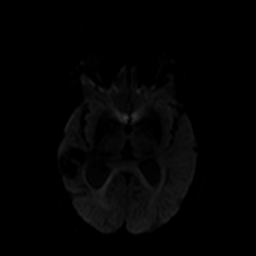
[im 106/176]
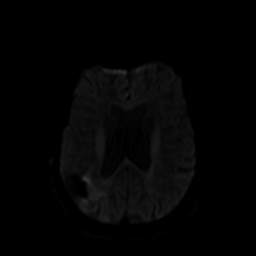
[im 123/176]
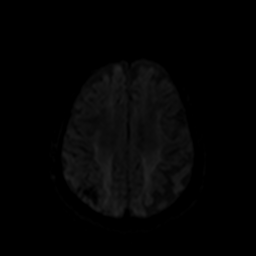
[im 141/176]
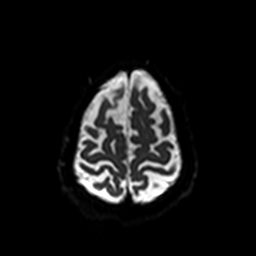
[im 158/176]
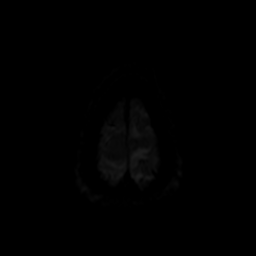
[im 176/176]
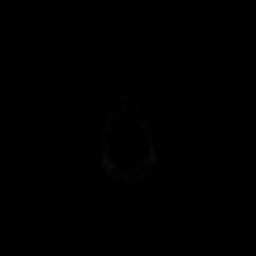

[Series 7: ax dwi_tracew · axial · 3.0mm · 0.94mm/px · z∈[-53,+104]mm · 6 of 88 slices shown]
[im 1/88]
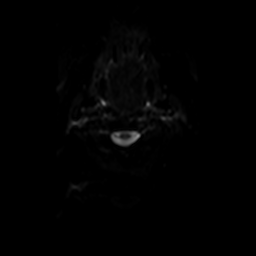
[im 18/88]
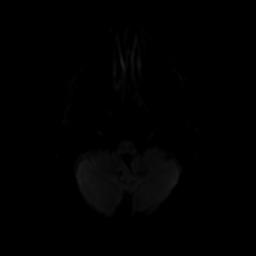
[im 35/88]
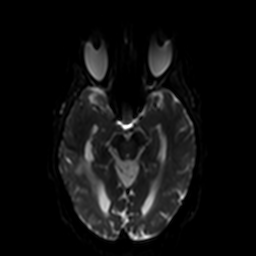
[im 53/88]
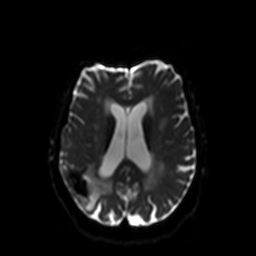
[im 70/88]
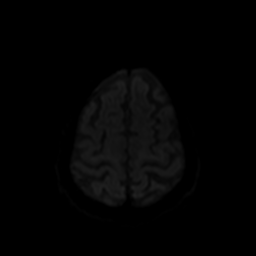
[im 88/88]
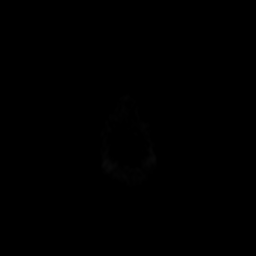

[Series 8: ax dwi_adc · axial · 3.0mm · 0.94mm/px · z∈[-53,+104]mm · 3 of 45 slices shown]
[im 1/45]
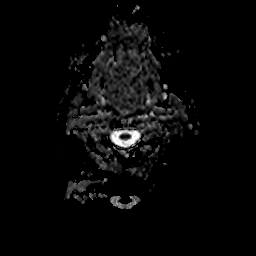
[im 23/45]
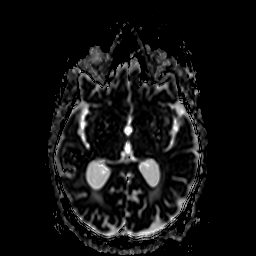
[im 45/45]
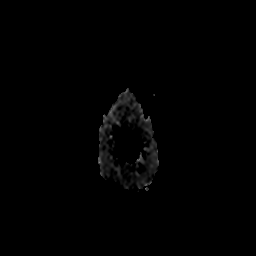

[Series 9: DWI · coronal · 5.0mm · 1.44mm/px · 4 of 66 slices shown (2 of 3)]
[im 1/66]
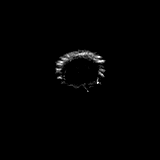
[im 22/66]
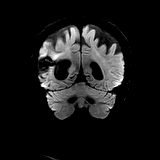
[im 44/66]
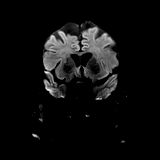
[im 66/66]
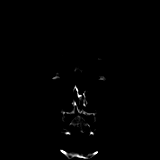

[Series 10: DWI · coronal · 5.0mm · 1.44mm/px · 2 of 33 slices shown (3 of 3)]
[im 1/33]
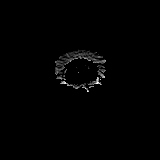
[im 33/33]
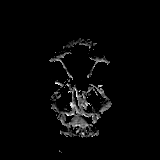

[Series 11: T2 · axial · 4.0mm · 0.36mm/px · z∈[-45,+117]mm · 2 of 33 slices shown (1 of 2)]
[im 1/33]
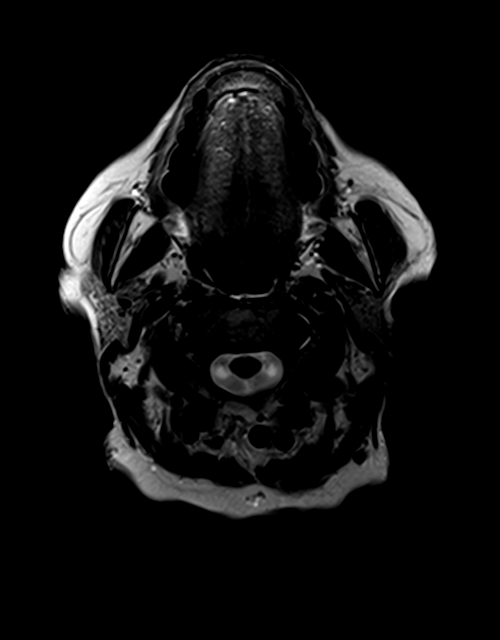
[im 33/33]
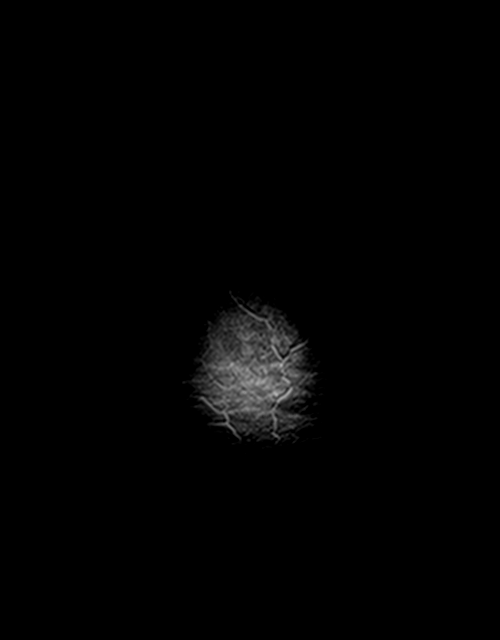

[Series 12: FLAIR · axial · 3.0mm · 0.72mm/px · z∈[-43,+115]mm · 2 of 28 slices shown]
[im 1/28]
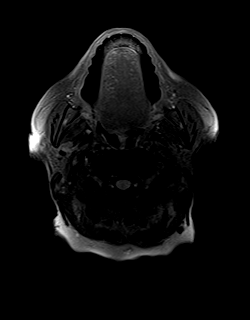
[im 28/28]
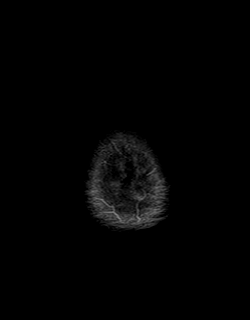

[Series 14: swi_images · axial · 3.0mm · 0.90mm/px · z∈[-56,+128]mm · 4 of 64 slices shown]
[im 1/64]
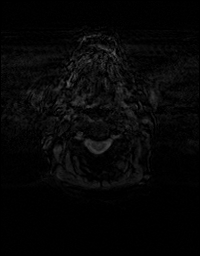
[im 22/64]
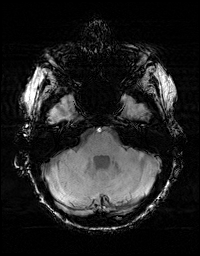
[im 43/64]
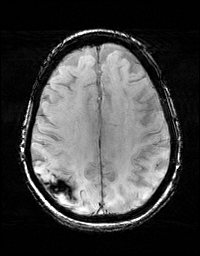
[im 64/64]
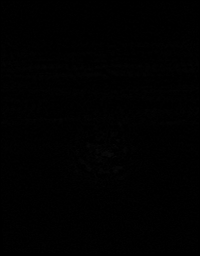

[Series 15: T1 · axial · 1.0mm · 0.90mm/px · z∈[-51,+106]mm · 10 of 160 slices shown (2 of 2)]
[im 1/160]
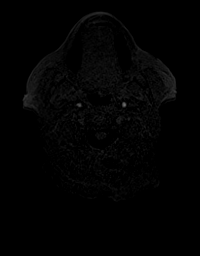
[im 18/160]
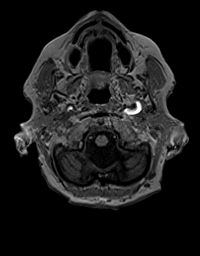
[im 36/160]
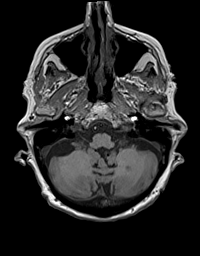
[im 54/160]
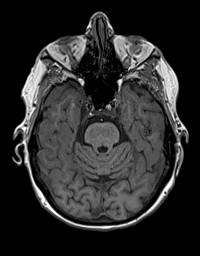
[im 71/160]
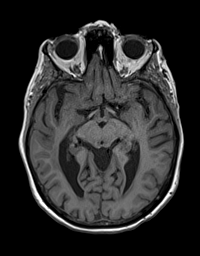
[im 89/160]
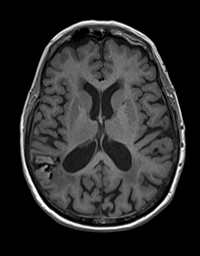
[im 107/160]
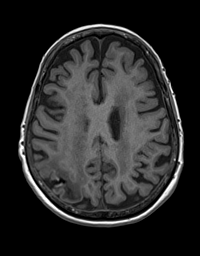
[im 124/160]
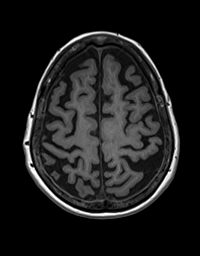
[im 142/160]
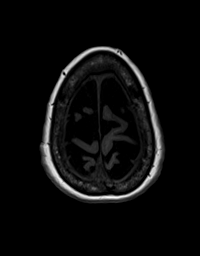
[im 160/160]
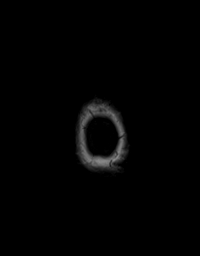

[Series 16: T2 · coronal · 4.5mm · 0.36mm/px · 2 of 33 slices shown (2 of 2)]
[im 1/33]
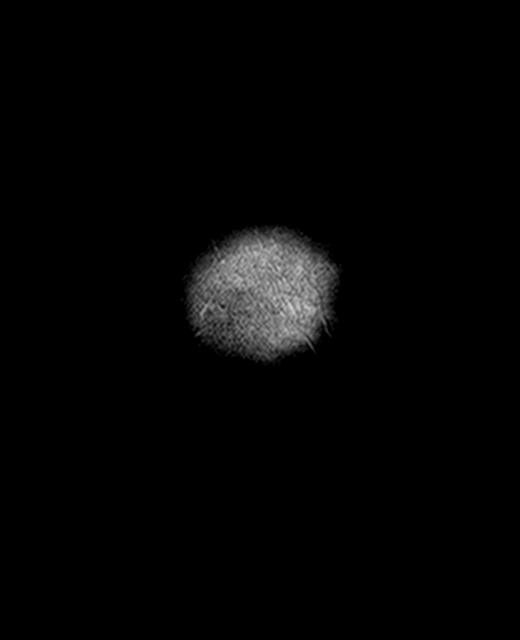
[im 33/33]
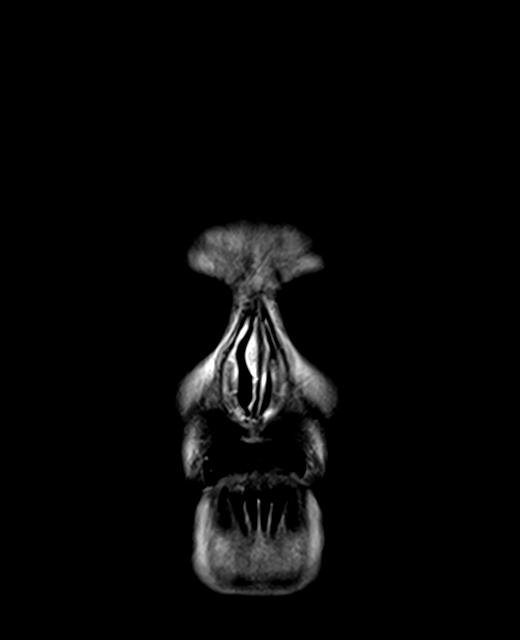

[48 of 48 positions shown; findings below may reference images not displayed]

FINDINGS: Brain: Encephalomalacia, laminar necrosis, and hemosiderin at the
junction of the right posterior temporal, parietal, and occipital
lobes corresponding to the [REDACTED] hemorrhage. Susceptibility artifact
there on DWI. Regional edema largely resolved. Mass effect has
resolved. Some ex vacuo enlargement now of the adjacent right
lateral ventricle.

No superimposed restricted diffusion to suggest acute infarction. No
midline shift, evidence of mass lesion, ventriculomegaly,
extra-axial collection. Cervicomedullary junction and pituitary are
within normal limits.

No other chronic cerebral blood products identified. Stable
underlying Patchy and confluent bilateral cerebral white matter T2
and FLAIR hyperintensity. No new signal abnormality compared [REDACTED].

Vascular: Major intracranial vascular flow voids are stable.

Skull and upper cervical spine: Negative for age visible cervical
spine. Visualized bone marrow signal is within normal limits.

Sinuses/Orbits: Stable, negative.

Other: Trace mastoid fluid appears stable or decreased. Visible
internal auditory structures appear normal. Negative visible scalp
and face.
IMPRESSION: 1. Expected evolution of the right hemisphere hemorrhage since
[REDACTED]. Encephalomalacia now with laminar necrosis and hemosiderin.

2. Underlying advanced cerebral white matter signal changes, most
commonly due to chronic small vessel disease. No new intracranial
abnormality.

## 2021-10-20 ENCOUNTER — Ambulatory Visit: Payer: Medicare Other

## 2021-10-20 ENCOUNTER — Encounter: Payer: Medicare Other | Admitting: Speech Pathology

## 2021-10-22 ENCOUNTER — Encounter: Payer: Medicare Other | Admitting: Speech Pathology

## 2021-10-22 ENCOUNTER — Ambulatory Visit: Payer: Medicare Other

## 2021-10-23 NOTE — Progress Notes (Signed)
Kindly inform the patient that follow-up MRI scan shows expected changes and improvement in prior brain hemorrhage and no unexpected findings or any worrisome findings.  Nothing to worry about

## 2021-10-24 ENCOUNTER — Telehealth: Payer: Self-pay | Admitting: *Deleted

## 2021-10-24 NOTE — Telephone Encounter (Signed)
Left patient a detailed message, with results, on voicemail (ok per DPR).  Provided our number to call back with any questions.  

## 2021-10-24 NOTE — Telephone Encounter (Signed)
-----   Message from Garvin Fila, MD sent at 10/23/2021  4:07 PM EDT ----- [name] inform the patient that follow-up MRI scan shows expected changes and improvement in prior brain hemorrhage and no unexpected findings or any worrisome findings.  Nothing to worry about

## 2021-10-27 ENCOUNTER — Ambulatory Visit: Payer: Medicare Other

## 2021-10-27 ENCOUNTER — Encounter: Payer: Medicare Other | Admitting: Speech Pathology

## 2021-10-29 ENCOUNTER — Ambulatory Visit: Payer: Medicare Other

## 2021-10-29 ENCOUNTER — Encounter: Payer: Medicare Other | Admitting: Speech Pathology

## 2021-11-03 ENCOUNTER — Ambulatory Visit: Payer: Medicare Other

## 2021-11-03 ENCOUNTER — Encounter: Payer: Medicare Other | Admitting: Speech Pathology

## 2021-11-05 ENCOUNTER — Ambulatory Visit: Payer: Medicare Other

## 2021-11-05 ENCOUNTER — Encounter: Payer: Medicare Other | Admitting: Speech Pathology

## 2021-11-05 ENCOUNTER — Encounter: Payer: Self-pay | Admitting: Speech Pathology

## 2021-11-05 NOTE — Therapy (Signed)
[facility] [address] [place], [place], [address] Phone: [phone]   Fax:  [phone]  Patient Details  Name: Lauren Conner MRN: [id number] Date of Birth: [birth year] Referring Provider:  No ref. provider found  Encounter Date: 11/05/2021  SPEECH THERAPY DISCHARGE SUMMARY  Visits from Start of Care: 12  Current functional level related to goals / functional outcomes: Moderate improvement through use of strategies and compensations to actively participate in desired activities of daily living. Pt with increasing independence in managing schedule, medications, finances, and avocational activities.    Remaining deficits: Impairment in cognitive domains of memory, complex attention, executive functioning (problem solving, processing, organization). At time of d/c, still supported by family for activities in which pt was previously independent.    Education / Equipment: Attention/memory compensations and strategies, meta-cognitive strategy instruction with real-life applications, systematic instruction to complete home tasks such as medication and financial management, overall brain health promoting lifestyle factors   Patient agrees to discharge. Patient goals were partially met. Patient is being discharged due to not returning since the last visit.  GOALS: Goals reviewed with patient? Yes   SHORT TERM GOALS: Target date: 09/03/2021   Pt will complete CLQT and PROM within first 2 sessions. Baseline: initiated Goal status: GOAL MET    2.  Pt will accurately sort medications into weekly pill box with occasional min A over 2 sessions.  Baseline: medications being managed by care partner; 09/02/2021 Goal status: PARTIALLY MET   3.  Pt will report successful implementation at home of x2 attention/memory compensations with occasional mod A over 2 sessions.  Baseline: reports difficulty focusing; impaired memory observed in  session; 08-18-2021, 09-02-2021 Goal status: GOAL MET   4.  Pt will demonstrate adeuqate organizational skills to complete mod complex therapy tasks with usual mod A over 2 sessions  Baseline: attention, ex fx skills inhibiting successful completion of tasks; 08-20-21, 09-04-2021 Goal status: GOAL PARTIALLY MET   5.  Pt will report increased household participation in x2 desired activities (shopping, meal prep, medication/financial management) with supervision from family members   Baseline: requires total A for ADLs and IADLS; 1-77-93, 01-19-99 Goal status: GOAL MET     LONG TERM GOALS: Target date: 10/29/2021   Pt will successfully manage household activities using external aids (to-do list, schedule, etc) with rare min A over 1 weeks  Baseline: total A for all ADLs; 10/08/2021 Goal status: PARTIALLY MET   2.  Pt will successfully implement a bill paying system with rare min A over 1 week period to promote independence with managing finances Baseline: requires total A; 10-08-21 Goal status: PARTIALLY MET   3.  Pt will demonstrate adeuqate organizational skills to complete mod complex therapy tasks with modified independence (compensations) in 2 sessions  Baseline: attention, ex fx skills inhibiting successful completion of tasks; 10-08-21 Goal status: PARTIALLY MET   4. Pt will demonstrate 5 pt improvement on Cognitive Fx - Abilities PROM by d/c.  Baseline: 89 Goal Status: NOT MET, D/T PT NOT RETURNING TO THERAPY   Su Monks, CCC-SLP 11/05/2021, 9:15 AM  [facility] [address] [place], [place], [address] Phone: [phone]   Fax:  [phone]

## 2021-11-10 ENCOUNTER — Encounter: Payer: Medicare Other | Admitting: Speech Pathology

## 2021-11-10 ENCOUNTER — Ambulatory Visit: Payer: Medicare Other

## 2021-11-11 ENCOUNTER — Emergency Department: Payer: Medicare Other

## 2021-11-11 ENCOUNTER — Emergency Department
Admission: EM | Admit: 2021-11-11 | Discharge: 2021-11-11 | Disposition: A | Discharge status: Home / Self Care | Payer: Medicare Other | Attending: Emergency Medicine | Admitting: Emergency Medicine

## 2021-11-11 DIAGNOSIS — Z8673 Personal history of transient ischemic attack (TIA), and cerebral infarction without residual deficits: Secondary | ICD-10-CM | POA: Insufficient documentation

## 2021-11-11 DIAGNOSIS — E039 Hypothyroidism, unspecified: Secondary | ICD-10-CM | POA: Diagnosis not present

## 2021-11-11 DIAGNOSIS — I671 Cerebral aneurysm, nonruptured: Secondary | ICD-10-CM | POA: Insufficient documentation

## 2021-11-11 DIAGNOSIS — N184 Chronic kidney disease, stage 4 (severe): Secondary | ICD-10-CM | POA: Diagnosis not present

## 2021-11-11 DIAGNOSIS — Z7982 Long term (current) use of aspirin: Secondary | ICD-10-CM | POA: Diagnosis not present

## 2021-11-11 DIAGNOSIS — I129 Hypertensive chronic kidney disease with stage 1 through stage 4 chronic kidney disease, or unspecified chronic kidney disease: Secondary | ICD-10-CM | POA: Insufficient documentation

## 2021-11-11 DIAGNOSIS — R200 Anesthesia of skin: Secondary | ICD-10-CM | POA: Insufficient documentation

## 2021-11-11 LAB — COMPREHENSIVE METABOLIC PANEL WITH GFR
ALT: 13 U/L (ref 0–44)
AST: 15 U/L (ref 15–41)
Albumin: 2.8 g/dL — ABNORMAL LOW (ref 3.5–5.0)
Alkaline Phosphatase: 127 U/L — ABNORMAL HIGH (ref 38–126)
Anion gap: 9 (ref 5–15)
BUN: 55 mg/dL — ABNORMAL HIGH (ref 8–23)
CO2: 20 mmol/L — ABNORMAL LOW (ref 22–32)
Calcium: 8 mg/dL — ABNORMAL LOW (ref 8.9–10.3)
Chloride: 103 mmol/L (ref 98–111)
Creatinine, Ser: 3.01 mg/dL — ABNORMAL HIGH (ref 0.44–1.00)
GFR, Estimated: 15 mL/min — ABNORMAL LOW
Glucose, Bld: 111 mg/dL — ABNORMAL HIGH (ref 70–99)
Potassium: 4.5 mmol/L (ref 3.5–5.1)
Sodium: 132 mmol/L — ABNORMAL LOW (ref 135–145)
Total Bilirubin: 0.6 mg/dL (ref 0.3–1.2)
Total Protein: 7.3 g/dL (ref 6.5–8.1)

## 2021-11-11 LAB — CBC WITH DIFFERENTIAL/PLATELET
Abs Immature Granulocytes: 0.04 K/uL (ref 0.00–0.07)
Basophils Absolute: 0.1 K/uL (ref 0.0–0.1)
Basophils Relative: 1 %
Eosinophils Absolute: 0.6 K/uL — ABNORMAL HIGH (ref 0.0–0.5)
Eosinophils Relative: 6 %
HCT: 27.8 % — ABNORMAL LOW (ref 36.0–46.0)
Hemoglobin: 8.7 g/dL — ABNORMAL LOW (ref 12.0–15.0)
Immature Granulocytes: 0 %
Lymphocytes Relative: 19 %
Lymphs Abs: 1.9 K/uL (ref 0.7–4.0)
MCH: 30.5 pg (ref 26.0–34.0)
MCHC: 31.3 g/dL (ref 30.0–36.0)
MCV: 97.5 fL (ref 80.0–100.0)
Monocytes Absolute: 1.3 K/uL — ABNORMAL HIGH (ref 0.1–1.0)
Monocytes Relative: 13 %
Neutro Abs: 6.1 K/uL (ref 1.7–7.7)
Neutrophils Relative %: 61 %
Platelets: 279 K/uL (ref 150–400)
RBC: 2.85 MIL/uL — ABNORMAL LOW (ref 3.87–5.11)
RDW: 16.4 % — ABNORMAL HIGH (ref 11.5–15.5)
WBC: 10 K/uL (ref 4.0–10.5)
nRBC: 0 % (ref 0.0–0.2)

## 2021-11-11 LAB — TROPONIN I (HIGH SENSITIVITY)
Troponin I (High Sensitivity): 10 ng/L
Troponin I (High Sensitivity): 8 ng/L (ref <18)

## 2021-11-11 MED ORDER — GADOBUTROL 1 MMOL/ML IV SOLN
5.0000 mL | Freq: Once | INTRAVENOUS | Status: AC | PRN
  Administered 2021-11-11: 5 mL via INTRAVENOUS

## 2021-11-11 MED ORDER — SODIUM CHLORIDE 0.9 % IV BOLUS
1000.0000 mL | Freq: Once | INTRAVENOUS | Status: AC
  Administered 2021-11-11: 1000 mL via INTRAVENOUS

## 2021-11-11 MED ORDER — ACETAMINOPHEN 500 MG PO TABS
1000.0000 mg | ORAL_TABLET | Freq: Once | ORAL | Status: AC
  Administered 2021-11-11: 1000 mg via ORAL
  Filled 2021-11-11: qty 2

## 2021-11-11 NOTE — ED Triage Notes (Signed)
Patient to ED via [facility] for stroke like symptoms onset approximately 30 minutes PTA. Pt reported dizziness, blurry vision that she thinks is related to her cataracts surgery approximately 2 weeks ago, and L sided facial droop. All symptoms resolved prior to arrival at the hospital. Pt did have a prior stroke in March with no residual symptoms. Patient is compliant with baby aspirin.

## 2021-11-11 NOTE — ED Notes (Signed)
Pt A&O, IV removed, pt given discharge instructions, pt assisted to vehicle in wheelchair by family.

## 2021-11-12 ENCOUNTER — Ambulatory Visit: Payer: Medicare Other

## 2021-11-12 ENCOUNTER — Encounter: Payer: Medicare Other | Admitting: Speech Pathology

## 2021-11-17 ENCOUNTER — Encounter: Payer: Medicare Other | Admitting: Speech Pathology

## 2021-11-17 ENCOUNTER — Ambulatory Visit: Payer: Medicare Other

## 2021-11-17 ENCOUNTER — Encounter: Payer: Self-pay | Admitting: Neurology

## 2021-11-19 ENCOUNTER — Encounter: Payer: Self-pay | Admitting: Physical Medicine & Rehabilitation

## 2021-11-19 ENCOUNTER — Ambulatory Visit: Payer: Medicare Other

## 2021-11-19 ENCOUNTER — Encounter: Payer: Medicare Other | Attending: Physical Medicine & Rehabilitation | Admitting: Physical Medicine & Rehabilitation

## 2021-11-19 ENCOUNTER — Encounter: Payer: Medicare Other | Admitting: Speech Pathology

## 2021-11-19 DIAGNOSIS — M792 Neuralgia and neuritis, unspecified: Secondary | ICD-10-CM | POA: Insufficient documentation

## 2021-11-19 MED ORDER — GABAPENTIN 100 MG PO CAPS: 100.0000 mg | ORAL_CAPSULE | Freq: Three times a day (TID) | ORAL | 2 refills | Status: DC

## 2021-11-19 NOTE — Progress Notes (Signed)
Subjective:    Patient ID: Lauren Conner, female    DOB: [birth year], 81 y.o.   MRN: [id number]  HPI  Mrs. 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  I last saw her on rehab in March.  She is continue to make nice strides in her recovery.  She is now walking without a device.  She ambulates daily at home and exercises.  She gets outside in the yard etc.  She denies any falls or mishaps since she has been home.  She is independent with all of her self-care and activities of daily living.  Bowels and bladder are functioning normally.  She maintains follow-up with nephrology for kidney disease.  Her blood pressure has been elevated at times and she ended up in the emergency room on 2 occasions where her systolic was above 397.  She is now on combination of Norvasc and hydralazine which seems to be controlling her blood pressures fairly well.  Additionally she is taking Inderal 10 mg 3 times daily.  Her biggest complaint today is left-sided rib pain.  He came on about a month after her hospitalization.  She denies any injuries or falls.  She denies any recent cold or upper respiratory infections or associated coughing.  The pain is enough for keeps her awake at night.  They have tried some over-the-counter patches for pain which have not provided long-term relief.  They have even tried diclofenac gel which did not give her any relief.  Pain Inventory Average Pain 5 Pain Right Now 5 My pain is constant, sharp, burning, stabbing, and aching  In the last 24 hours, has pain interfered with the following? General activity 8 Relation with others 8 Enjoyment of life 9 What TIME of day is your pain at its worst? morning , daytime, evening, and night Sleep (in general) Poor  Pain is worse with: walking, bending, sitting, inactivity, standing, and some activites Pain improves with: medication Relief from Meds: 3  Family History  Problem Relation Age of Onset   Stroke Neg Hx     Social History   Socioeconomic History   Marital status: Widowed    Spouse name: Not on file   Number of children: Not on file   Years of education: Not on file   Highest education level: Not on file  Occupational History   Not on file  Tobacco Use   Smoking status: Never   Smokeless tobacco: Never  Vaping Use   Vaping Use: Never used  Substance and Sexual Activity   Alcohol use: Never   Drug use: Never   Sexual activity: Not on file  Other Topics Concern   Not on file  Social History Narrative   Not on file   Social Determinants of Health   Financial Resource Strain: Not on file  Food Insecurity: Not on file  Transportation Needs: Not on file  Physical Activity: Not on file  Stress: Not on file  Social Connections: Not on file   History reviewed. No pertinent surgical history. History reviewed. No pertinent surgical history. Past Medical History:  Diagnosis Date   Hypertension    Renal disorder    BP 114/74   Pulse 73   Ht 5\' 3"  (1.6 m)   Wt 115 lb 12.8 oz (52.5 kg)   SpO2 93%   BMI 20.51 kg/m   Opioid Risk Score:   Fall Risk Score:  `1  Depression screen [facility] 2/9     11/19/2021  10:26 AM 08/11/2021    9:14 AM  Depression screen [facility] 2/9  Decreased Interest 0 0  Down, Depressed, Hopeless 0 1  PHQ - 2 Score 0 1  Altered sleeping  0  Tired, decreased energy  0  Change in appetite  0  Feeling bad or failure about yourself   0  Trouble concentrating  0  Moving slowly or fidgety/restless  0  Suicidal thoughts  0  PHQ-9 Score  1  Difficult doing work/chores  Not difficult at all     Review of Systems  Constitutional: Negative.   HENT: Negative.    Eyes: Negative.   Respiratory: Negative.    Cardiovascular: Negative.   Gastrointestinal:  Positive for abdominal pain.  Endocrine: Negative.   Genitourinary: Negative.   Musculoskeletal:  Positive for back pain.  Skin: Negative.   Allergic/Immunologic: Negative.   Neurological: Negative.    Hematological: Negative.   Psychiatric/Behavioral:  Positive for sleep disturbance.       Objective:   Physical Exam Gen: no distress, normal appearing HEENT: oral mucosa pink and moist, NCAT Cardio: Reg rate Chest: normal effort, normal rate of breathing Abd: soft, non-distended Ext: no edema Psych: pleasant, normal affect Skin: intact Neuro: Alert and oriented x 3. Normal insight and awareness. Intact Memory. Normal language and speech. Cranial nerve exam unremarkable. Motor 4+/5. Sensory generally intact except for dysesthesias diffusely along left chest wall. Gait stable although gait base is very narrow Musculoskeletal: Full ROM, No pain with AROM or PROM in the neck,   or extremities. Posture appropriate. Trunk left side tender with palpation       Assessment & Plan:   Medical Problem List and Plan: 1. 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             -doing extremely well. Continue with HEP   2.  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 Pain Management/headaches: improved -left chest wall pain. ? Neuropathic -gabapentin 100mg  tid with titration 4.  AKI on CKD stage IV.  per renal 5  Hypertension.    per primary  -bp well controlled at preswent 6.  Hypothyroidism: continue synthroid 185mcg daily 7 History of cardiac dysfunction with SVT.      Fifteen minutes of face to face patient care time were spent during this visit. All questions were encouraged and answered.  Follow up with me in 3 mos .

## 2021-11-19 NOTE — Patient Instructions (Signed)
GABAPENTIN:  100MG  AT BEDTIME FOR 4 DAYS THEN TWICE DAILY FOR 4 DAYS THEN THREE X DAILY THEREAFTER.   AFTER TWO WEEKS, IF THREE X DAILY GABAPENTIN  IS TOLERATED BUT NOT HELPING, GIVE ME A CALL AND WE'LL ADJUST FURTHER.

## 2021-11-28 ENCOUNTER — Other Ambulatory Visit: Payer: Self-pay | Admitting: Family Medicine

## 2021-11-28 ENCOUNTER — Other Ambulatory Visit: Payer: Self-pay | Admitting: *Deleted

## 2021-11-28 ENCOUNTER — Ambulatory Visit
Admission: RE | Admit: 2021-11-28 | Discharge: 2021-11-28 | Disposition: A | Discharge status: Home / Self Care | Payer: Self-pay | Source: Ambulatory Visit | Attending: *Deleted | Admitting: *Deleted

## 2021-11-28 DIAGNOSIS — Z1231 Encounter for screening mammogram for malignant neoplasm of breast: Secondary | ICD-10-CM

## 2021-11-28 DIAGNOSIS — N6489 Other specified disorders of breast: Secondary | ICD-10-CM

## 2021-12-02 ENCOUNTER — Inpatient Hospital Stay
Admission: RE | Admit: 2021-12-02 | Discharge: 2021-12-02 | Disposition: A | Discharge status: Home / Self Care | Payer: Self-pay | Source: Ambulatory Visit | Attending: *Deleted | Admitting: *Deleted

## 2021-12-02 ENCOUNTER — Other Ambulatory Visit: Payer: Self-pay | Admitting: Family Medicine

## 2021-12-02 ENCOUNTER — Other Ambulatory Visit: Payer: Self-pay | Admitting: *Deleted

## 2021-12-02 DIAGNOSIS — Z1231 Encounter for screening mammogram for malignant neoplasm of breast: Secondary | ICD-10-CM

## 2021-12-02 DIAGNOSIS — N6489 Other specified disorders of breast: Secondary | ICD-10-CM

## 2021-12-19 ENCOUNTER — Ambulatory Visit
Admission: RE | Admit: 2021-12-19 | Discharge: 2021-12-19 | Disposition: A | Discharge status: Home / Self Care | Payer: Medicare Other | Source: Ambulatory Visit | Attending: Family Medicine | Admitting: Family Medicine

## 2021-12-19 DIAGNOSIS — Z1231 Encounter for screening mammogram for malignant neoplasm of breast: Secondary | ICD-10-CM | POA: Diagnosis present

## 2021-12-19 DIAGNOSIS — N6489 Other specified disorders of breast: Secondary | ICD-10-CM | POA: Diagnosis present

## 2022-01-22 ENCOUNTER — Other Ambulatory Visit: Payer: Self-pay

## 2022-01-22 DIAGNOSIS — M792 Neuralgia and neuritis, unspecified: Secondary | ICD-10-CM

## 2022-01-22 MED ORDER — GABAPENTIN 100 MG PO CAPS: 100.0000 mg | ORAL_CAPSULE | Freq: Three times a day (TID) | ORAL | 0 refills | Status: AC

## 2022-01-22 NOTE — Telephone Encounter (Signed)
90 day supply request for Gabapentin to be sent to [facility]

## 2022-02-03 ENCOUNTER — Ambulatory Visit: Payer: Medicare Other | Admitting: Neurology

## 2022-02-18 ENCOUNTER — Ambulatory Visit: Payer: Medicare Other | Admitting: Neurology

## 2022-02-25 ENCOUNTER — Encounter: Payer: Medicare Other | Admitting: Physical Medicine & Rehabilitation

## 2022-03-04 ENCOUNTER — Encounter: Payer: Medicare Other | Attending: Physical Medicine & Rehabilitation | Admitting: Physical Medicine & Rehabilitation

## 2022-03-04 ENCOUNTER — Encounter: Payer: Self-pay | Admitting: Physical Medicine & Rehabilitation

## 2022-03-04 VITALS — BP 127/79 | HR 64 | Ht 63.0 in | Wt 117.3 lb

## 2022-03-04 DIAGNOSIS — J189 Pneumonia, unspecified organism: Secondary | ICD-10-CM

## 2022-03-04 DIAGNOSIS — I619 Nontraumatic intracerebral hemorrhage, unspecified: Secondary | ICD-10-CM | POA: Diagnosis present

## 2022-03-04 NOTE — Patient Instructions (Signed)
ALWAYS FEEL FREE TO CALL OUR OFFICE WITH ANY PROBLEMS OR QUESTIONS ([phone])  **PLEASE NOTE** ALL MEDICATION REFILL REQUESTS (INCLUDING CONTROLLED SUBSTANCES) NEED TO BE MADE AT LEAST 7 DAYS PRIOR TO REFILL BEING DUE. ANY REFILL REQUESTS INSIDE THAT TIME FRAME MAY RESULT IN DELAYS IN RECEIVING YOUR PRESCRIPTION.                   GIVE THE REMERON A WEEK AND SEE HOW YOU DO. IF IT DOESN'T WORK, YOU MAY ADD MELATONIN 5MG  AND THAT CAN BE INCREASED TO 10MG  AFTER A FEW DAYS IF THE 5MG  DOESN'T WORK.

## 2022-03-04 NOTE — Progress Notes (Signed)
Subjective:    Patient ID: Lauren Conner, female    DOB: [birth year], 81 y.o.   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  She has been doing fairly well until recently when she was admitted to the hospital with pneumonia.  She was discharged on October 5.  She's down to 1L oxygen. She becomes short winded when putting her clothes on.  She does try to do some short distance walking at home but it still is limited.  She does feel that she may be a bit better but not dramatically so.  She is afebrile and is not having any productive cough on a regular basis.  She still has some pain on her right rib but it's better.  She is able to move more easily.  The pain is not affecting her sleep.  She still having some problems falling asleep however.  Her family doctor placed her on Remeron today 7.5 mg.  The hope is that it will also help with her appetite.  This has remained a challenge at times especially since she was sick.   Pain Inventory Average Pain 5 Pain Right Now 5 My pain is constant, sharp, burning, dull, stabbing, tingling, and aching  In the last 24 hours, has pain interfered with the following? General activity 8 Relation with others 0 Enjoyment of life 8 What TIME of day is your pain at its worst? morning , daytime, evening, and night Sleep (in general) Poor  Pain is worse with: unsure Pain improves with: medication Relief from Meds: 5  Family History  Problem Relation Age of Onset   Breast cancer Maternal Aunt    Breast cancer Paternal Aunt    Stroke Neg Hx    Social History   Socioeconomic History   Marital status: Widowed    Spouse name: Not on file   Number of children: Not on file   Years of education: Not on file   Highest education level: Not on file  Occupational History   Not on file  Tobacco Use   Smoking status: Never   Smokeless tobacco: Never  Vaping Use   Vaping  Use: Never used  Substance and Sexual Activity   Alcohol use: Never   Drug use: Never   Sexual activity: Not on file  Other Topics Concern   Not on file  Social History Narrative   Not on file   Social Determinants of Health   Financial Resource Strain: Not on file  Food Insecurity: Not on file  Transportation Needs: Not on file  Physical Activity: Not on file  Stress: Not on file  Social Connections: Not on file   No past surgical history on file. No past surgical history on file. Past Medical History:  Diagnosis Date   Hypertension    Renal disorder    BP 127/79   Pulse 64   Ht 5\' 3"  (1.6 m)   Wt 117 lb 4.8 oz (53.2 kg)   SpO2 94%   BMI 20.78 kg/m   Opioid Risk Score:   Fall Risk Score:  `1  Depression screen PHQ 2/9     03/04/2022    1:17 PM 11/19/2021   10:26 AM 08/11/2021    9:14 AM  Depression screen PHQ 2/9  Decreased Interest 0 0 0  Down, Depressed, Hopeless 0 0 1  PHQ - 2 Score 0 0 1  Altered sleeping   0  Tired, decreased energy   0  Change in appetite   0  Feeling bad or failure about yourself    0  Trouble concentrating   0  Moving slowly or fidgety/restless   0  Suicidal thoughts   0  PHQ-9 Score   1  Difficult doing work/chores   Not difficult at all    Review of Systems  Constitutional: Negative.   HENT: Negative.    Eyes: Negative.   Respiratory: Negative.    Cardiovascular: Negative.   Gastrointestinal: Negative.   Endocrine: Negative.   Genitourinary: Negative.   Musculoskeletal:        Right flank  Skin: Negative.   Allergic/Immunologic: Negative.   Neurological: Negative.   Hematological: Negative.   Psychiatric/Behavioral: Negative.    All other systems reviewed and are negative.      Objective:   Physical Exam  General: No acute distress. In w/c HEENT: NCAT, EOMI, oral membranes moist Cards: reg rate  Chest: normal effort, O2 [place], crackles at both bases. Scattered rhonchi Abdomen: Soft, NT, ND Skin: dry,  intact Extremities: no edema Psych: pleasant and appropriate  Skin: intact Neuro: Alert and oriented x 3. Normal insight and awareness. Intact Memory. Normal language and speech. Cranial nerve exam unremarkable. Motor 5/5. Decreased dysesthesias along right chest. Did not ambulate Musculoskeletal: Full ROM, No pain with AROM or PROM in the neck,   or extremities. Posture appropriate. Trunk left side tender with palpation           Assessment & Plan:    Medical Problem List and Plan: 1. 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             -doing extremely well from a neurological standpoint   -Her biggest problem currently pertains to her lungs and stamina.  She really needs just to get up and walk around and move is much as she can. 2. 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 3. Pain Management/headaches: improved -left chest wall pain. ? Neuropathic -gabapentin 100mg  tid with titration 4.  AKI on CKD stage IV.  per renal 5  Hypertension.    per primary             -bp well controlled today 6.  Hypothyroidism: continue synthroid 140mcg daily 7. History of cardiac dysfunction with SVT.    8. Recent pneumonia:  -Patient with significant rales on chest auscultation.  -Continued dyspnea with exertion.  Family does think that she does look a bit better.  She has been afebrile.  -If she does not experience further improvement over the next week or 2 she should follow-up with her family doctor.     Fifteen minutes of face to face patient care time were spent during this visit. All questions were encouraged and answered.  Follow up with me in 6 mos .

## 2022-05-18 DEATH — deceased

## 2022-07-07 ENCOUNTER — Ambulatory Visit: Payer: Medicare Other | Admitting: Neurology

## 2022-09-02 ENCOUNTER — Ambulatory Visit: Payer: Medicare Other | Admitting: Physical Medicine & Rehabilitation
